# Patient Record
Sex: Female | Born: 1935 | Race: White | Hispanic: No | Marital: Single | Smoking: Never smoker
Health system: Southern US, Community
[De-identification: ages and names within clinical notes are randomized; demographics above are authoritative.]

## PROBLEM LIST (undated history)

## (undated) DIAGNOSIS — I4891 Unspecified atrial fibrillation: Secondary | ICD-10-CM

## (undated) DIAGNOSIS — M069 Rheumatoid arthritis, unspecified: Secondary | ICD-10-CM

## (undated) DIAGNOSIS — I509 Heart failure, unspecified: Secondary | ICD-10-CM

## (undated) DIAGNOSIS — I447 Left bundle-branch block, unspecified: Secondary | ICD-10-CM

## (undated) DIAGNOSIS — I340 Nonrheumatic mitral (valve) insufficiency: Secondary | ICD-10-CM

## (undated) DIAGNOSIS — I272 Pulmonary hypertension, unspecified: Secondary | ICD-10-CM

## (undated) DIAGNOSIS — I341 Nonrheumatic mitral (valve) prolapse: Secondary | ICD-10-CM

## (undated) DIAGNOSIS — J449 Chronic obstructive pulmonary disease, unspecified: Secondary | ICD-10-CM

## (undated) DIAGNOSIS — Z950 Presence of cardiac pacemaker: Secondary | ICD-10-CM

## (undated) DIAGNOSIS — I493 Ventricular premature depolarization: Secondary | ICD-10-CM

## (undated) DIAGNOSIS — Z90710 Acquired absence of both cervix and uterus: Secondary | ICD-10-CM

## (undated) DIAGNOSIS — Z8679 Personal history of other diseases of the circulatory system: Secondary | ICD-10-CM

## (undated) DIAGNOSIS — I10 Essential (primary) hypertension: Secondary | ICD-10-CM

## (undated) DIAGNOSIS — Z953 Presence of xenogenic heart valve: Secondary | ICD-10-CM

## (undated) DIAGNOSIS — Z9889 Other specified postprocedural states: Secondary | ICD-10-CM

## (undated) HISTORY — DX: Nonrheumatic mitral (valve) insufficiency: I34.0

## (undated) HISTORY — PX: CHOLECYSTECTOMY: SHX55

## (undated) HISTORY — DX: Rheumatoid arthritis, unspecified: M06.9

## (undated) HISTORY — DX: Unspecified atrial fibrillation: I48.91

## (undated) HISTORY — DX: Acquired absence of both cervix and uterus: Z90.710

## (undated) HISTORY — PX: OVARIAN CYST REMOVAL: SHX89

## (undated) HISTORY — DX: Left bundle-branch block, unspecified: I44.7

## (undated) HISTORY — DX: Ventricular premature depolarization: I49.3

## (undated) HISTORY — DX: Nonrheumatic mitral (valve) prolapse: I34.1

## (undated) HISTORY — PX: PACEMAKER INSERTION: SHX728

## (undated) HISTORY — DX: Pulmonary hypertension, unspecified: I27.20

## (undated) HISTORY — PX: CATARACT EXTRACTION: SUR2

## (undated) HISTORY — PX: OTHER SURGICAL HISTORY: SHX169

## (undated) HISTORY — PX: ABDOMINAL HYSTERECTOMY: SHX81

## (undated) HISTORY — PX: APPENDECTOMY: SHX54

---

## 1998-02-02 ENCOUNTER — Ambulatory Visit (HOSPITAL_COMMUNITY): Admission: RE | Admit: 1998-02-02 | Discharge: 1998-02-02 | Payer: Self-pay | Admitting: Pulmonary Disease

## 2002-01-09 ENCOUNTER — Encounter: Payer: Self-pay | Admitting: Emergency Medicine

## 2002-01-09 ENCOUNTER — Inpatient Hospital Stay (HOSPITAL_COMMUNITY): Admission: EM | Admit: 2002-01-09 | Discharge: 2002-01-10 | Payer: Self-pay | Admitting: Plastic Surgery

## 2002-01-10 ENCOUNTER — Encounter: Payer: Self-pay | Admitting: Emergency Medicine

## 2004-11-27 ENCOUNTER — Ambulatory Visit: Payer: Self-pay | Admitting: Pulmonary Disease

## 2005-09-21 ENCOUNTER — Emergency Department (HOSPITAL_COMMUNITY): Admission: EM | Admit: 2005-09-21 | Discharge: 2005-09-21 | Payer: Self-pay | Admitting: Family Medicine

## 2006-02-16 ENCOUNTER — Ambulatory Visit: Payer: Self-pay | Admitting: Pulmonary Disease

## 2006-09-30 ENCOUNTER — Ambulatory Visit (HOSPITAL_COMMUNITY): Admission: RE | Admit: 2006-09-30 | Discharge: 2006-09-30 | Payer: Self-pay | Admitting: Endocrinology

## 2007-06-12 DIAGNOSIS — J309 Allergic rhinitis, unspecified: Secondary | ICD-10-CM | POA: Insufficient documentation

## 2007-06-12 DIAGNOSIS — J449 Chronic obstructive pulmonary disease, unspecified: Secondary | ICD-10-CM

## 2007-06-12 DIAGNOSIS — J40 Bronchitis, not specified as acute or chronic: Secondary | ICD-10-CM | POA: Insufficient documentation

## 2007-06-12 DIAGNOSIS — J4489 Other specified chronic obstructive pulmonary disease: Secondary | ICD-10-CM | POA: Insufficient documentation

## 2007-06-12 DIAGNOSIS — J329 Chronic sinusitis, unspecified: Secondary | ICD-10-CM | POA: Insufficient documentation

## 2007-06-14 ENCOUNTER — Ambulatory Visit: Payer: Self-pay | Admitting: Pulmonary Disease

## 2007-10-27 ENCOUNTER — Ambulatory Visit: Payer: Self-pay | Admitting: Cardiology

## 2007-11-08 ENCOUNTER — Ambulatory Visit: Payer: Self-pay

## 2007-11-08 ENCOUNTER — Encounter: Payer: Self-pay | Admitting: Cardiology

## 2007-11-26 ENCOUNTER — Ambulatory Visit: Payer: Self-pay | Admitting: Cardiology

## 2007-12-16 ENCOUNTER — Ambulatory Visit (HOSPITAL_COMMUNITY): Admission: RE | Admit: 2007-12-16 | Discharge: 2007-12-16 | Payer: Self-pay | Admitting: Endocrinology

## 2008-12-20 ENCOUNTER — Ambulatory Visit (HOSPITAL_COMMUNITY): Admission: RE | Admit: 2008-12-20 | Discharge: 2008-12-20 | Payer: Self-pay | Admitting: Endocrinology

## 2009-11-12 DIAGNOSIS — I2789 Other specified pulmonary heart diseases: Secondary | ICD-10-CM

## 2009-11-12 DIAGNOSIS — I080 Rheumatic disorders of both mitral and aortic valves: Secondary | ICD-10-CM

## 2009-11-12 DIAGNOSIS — I059 Rheumatic mitral valve disease, unspecified: Secondary | ICD-10-CM | POA: Insufficient documentation

## 2009-11-14 ENCOUNTER — Ambulatory Visit: Payer: Self-pay | Admitting: Cardiology

## 2009-11-14 DIAGNOSIS — R9431 Abnormal electrocardiogram [ECG] [EKG]: Secondary | ICD-10-CM

## 2009-12-06 ENCOUNTER — Ambulatory Visit: Payer: Self-pay

## 2009-12-06 ENCOUNTER — Ambulatory Visit: Payer: Self-pay | Admitting: Internal Medicine

## 2009-12-06 ENCOUNTER — Encounter (INDEPENDENT_AMBULATORY_CARE_PROVIDER_SITE_OTHER): Payer: Self-pay | Admitting: *Deleted

## 2009-12-06 ENCOUNTER — Ambulatory Visit (HOSPITAL_COMMUNITY): Admission: RE | Admit: 2009-12-06 | Discharge: 2009-12-06 | Payer: Self-pay | Admitting: Cardiology

## 2009-12-10 ENCOUNTER — Telehealth: Payer: Self-pay | Admitting: Cardiology

## 2010-01-03 ENCOUNTER — Encounter
Admission: RE | Admit: 2010-01-03 | Discharge: 2010-01-03 | Payer: Self-pay | Source: Home / Self Care | Admitting: Endocrinology

## 2010-08-25 ENCOUNTER — Emergency Department (HOSPITAL_COMMUNITY)
Admission: EM | Admit: 2010-08-25 | Discharge: 2010-08-25 | Payer: Self-pay | Source: Home / Self Care | Admitting: Emergency Medicine

## 2010-08-25 ENCOUNTER — Encounter: Payer: Self-pay | Admitting: Endocrinology

## 2010-08-26 ENCOUNTER — Encounter: Payer: Self-pay | Admitting: Physician Assistant

## 2010-08-27 LAB — CBC
HCT: 38.6 % (ref 36.0–46.0)
Hemoglobin: 12.5 g/dL (ref 12.0–15.0)
MCH: 31.1 pg (ref 26.0–34.0)
RBC: 4.02 MIL/uL (ref 3.87–5.11)

## 2010-08-27 LAB — POCT I-STAT, CHEM 8
Chloride: 103 mEq/L (ref 96–112)
Creatinine, Ser: 1.2 mg/dL (ref 0.4–1.2)
Glucose, Bld: 111 mg/dL — ABNORMAL HIGH (ref 70–99)
Hemoglobin: 13.3 g/dL (ref 12.0–15.0)
Potassium: 3.6 mEq/L (ref 3.5–5.1)
Sodium: 139 mEq/L (ref 135–145)

## 2010-08-27 LAB — POCT CARDIAC MARKERS
Myoglobin, poc: 109 ng/mL (ref 12–200)
Troponin i, poc: <0.05 ng/mL (ref 0.00–0.09)

## 2010-08-27 LAB — D-DIMER, QUANTITATIVE: D-Dimer, Quant: 0.69 ug/mL-FEU — ABNORMAL HIGH (ref 0.00–0.48)

## 2010-08-28 ENCOUNTER — Encounter: Payer: Self-pay | Admitting: Cardiovascular Disease

## 2010-08-28 ENCOUNTER — Ambulatory Visit
Admission: RE | Admit: 2010-08-28 | Discharge: 2010-08-28 | Payer: Self-pay | Source: Home / Self Care | Attending: Physician Assistant | Admitting: Physician Assistant

## 2010-08-28 ENCOUNTER — Encounter: Payer: Self-pay | Admitting: Physician Assistant

## 2010-08-28 DIAGNOSIS — R079 Chest pain, unspecified: Secondary | ICD-10-CM | POA: Insufficient documentation

## 2010-09-02 ENCOUNTER — Telehealth: Payer: Self-pay | Admitting: Cardiology

## 2010-09-03 NOTE — Progress Notes (Signed)
Summary: echo results  Phone Note Call from Patient Call back at [phone]   Summary of Call: pt calling requesting results of echo, having phone problems, please call sister Emmit Alexanders and give results Initial call taken by: Migdalia Dk,  Dec 10, 2009 3:00 PM  Follow-up for Phone Call        called and spoke with pts sister as requested with results.  She will have the pt to call back if further questions Follow-up by: Charolotte Capuchin, RN,  Dec 10, 2009 3:36 PM

## 2010-09-03 NOTE — Assessment & Plan Note (Signed)
Summary: f2y/kfw   Visit Type:  Follow-up Primary Provider:  Corrin Parker, MD  CC:  Pulm HTN and MVP.  History of Present Illness: The patient presents for follow up of the above.  Since I last saw her she has had no new cardiovascular complaints. She is active. She does her activities of daily living without significant limitation. She climbs stairs and does some vacuum cleaning. With this she denies any shortness of breath. She's had no palpitations, presyncope or syncope. She's had no PND or orthopnea. She has had no weight gain or edema.  Current Medications (verified): 1)  Fosamax 70 Mg Tabs (Alendronate Sodium) .Marland Kitchen.. 1 Tab Weekly 2)  Methotrexate 2.5 Mg Tabs (Methotrexate Sodium) .... 4 Tabs Once Weekly 3)  Prednisone 5 Mg Tabs (Prednisone) .... 1/2 Tab Every Other Day 4)  Folic Acid 1 Mg Tabs (Folic Acid) .... 2 Tabs Once Daily 5)  Maxzide-25 37.5-25 Mg Tabs (Triamterene-Hctz) .... 1/2 Tab Once Daily 6)  Calcium-Vitamin D 250-125 Mg-Unit Tabs (Calcium Carbonate-Vitamin D) .... 2 Tabs Once Daily  Allergies: 1)  ! * Tequin 2)  Sulfa  Past History:  Past Medical History:  1. Mitral valve prolapse.   2. Mild to moderate pulmonary hypertension by echo.   3. Mild mitral regurgitation.   4. Asthma.   5. Rheumatoid arthritis.   6. Mild basal septal hypertrophy.       Past Surgical History: 1. Hysterectomy.  2. Cholecystectomy   Review of Systems       As stated in the HPI and negative for all other systems.   Vital Signs:  Patient profile:   77 year old female Height:      60 inches Weight:      129 pounds BMI:     25.28 Pulse rate:   70 / minute Pulse rhythm:   irregular BP sitting:   130 / 70  (right arm) Cuff size:   regular  Vitals Entered By: Danielle Rankin, CMA (November 14, 2009 12:01 PM)  Physical Exam  General:  Well developed, well nourished, in no acute distress. Head:  normocephalic and atraumatic Eyes:  PERRLA/EOM intact; conjunctiva and lids  normal. Mouth:  Teeth, gums and palate normal. Oral mucosa normal. Neck:  Neck supple, no JVD. No masses, thyromegaly or abnormal cervical nodes. Chest Wall:  no deformities or breast masses noted Lungs:  Clear bilaterally to auscultation and percussion. Abdomen:  Bowel sounds positive; abdomen soft and non-tender without masses, organomegaly, or hernias noted. No hepatosplenomegaly. Msk:  Back normal, normal gait. Muscle strength and tone normal. Extremities:  No clubbing or cyanosis. Neurologic:  Alert and oriented x 3. Skin:  Intact without lesions or rashes. Cervical Nodes:  no significant adenopathy Axillary Nodes:  no significant adenopathy Inguinal Nodes:  no significant adenopathy Psych:  Normal affect.   Detailed Cardiovascular Exam  Neck    Carotids: right carotid bruit versus transmitted systolic murmur    Neck Veins: Normal, no JVD.    Heart    Inspection: no deformities or lifts noted.      Palpation: normal PMI with no thrills palpable.      Auscultation: S1 and S2 within normal limits, no S3, no S4, no clicks, no rubs, 3/6 systolic murmur radiating to the axilla and also heard radiating anteriorly about the aortic outflow tract, holosystolic, no diastolic murmurs  Vascular    Abdominal Aorta: no palpable masses, pulsations, or audible bruits.      Femoral Pulses: normal femoral pulses bilaterally.  Pedal Pulses: normal pedal pulses bilaterally.      Radial Pulses: normal radial pulses bilaterally.      Peripheral Circulation: no clubbing, cyanosis, or edema noted with normal capillary refill.     EKG  Procedure date:  11/14/2009  Findings:      sinus rhythm, left bundle branch block, first degree AV block, left ward axis  Impression & Recommendations:  Problem # 1:  MITRAL REGURGITATION, MILD (ICD-396.3) I am concerned that her mitral regurgitation may be more significant. An echocardiogram is indicated and will be obtained. This will also allow me to  assess pulmonary hypertension as she did have moderate TR in the past as well. Orders: EKG w/ Interpretation (93000) Echocardiogram (Echo)  Problem # 2:  PULMONARY HYPERTENSION (ICD-416.8) As above  Problem # 3:  ABNORMAL ELECTROCARDIOGRAM (ICD-794.31) Her EKG is unchanged from previous. No further evaluation is needed.  Patient Instructions: 1)  Your physician recommends that you schedule a follow-up appointment in: as needed 2)  Your physician recommends that you continue on your current medications as directed. Please refer to the Current Medication list given to you today. 3)  Your physician has requested that you have an echocardiogram.  Echocardiography is a painless test that uses sound waves to create images of your heart. It provides your doctor with information about the size and shape of your heart and how well your heart's chambers and valves are working.  This procedure takes approximately one hour. There are no restrictions for this procedure. 4)  You have been diagnosed with heart valve disease. Heart valves are the doorways between the chambers of your heart. They open and close as blood moves through your heart. Having heart valve disease means that you have a problem with one or more of these valves. This problem keeps the valve from opening or closing correctly. Please see the handout/brochure given to you today for more information.

## 2010-09-04 ENCOUNTER — Telehealth (INDEPENDENT_AMBULATORY_CARE_PROVIDER_SITE_OTHER): Payer: Self-pay

## 2010-09-05 ENCOUNTER — Ambulatory Visit (HOSPITAL_COMMUNITY): Payer: Medicare Other | Attending: Physician Assistant

## 2010-09-05 ENCOUNTER — Encounter: Payer: Self-pay | Admitting: Cardiology

## 2010-09-05 DIAGNOSIS — I447 Left bundle-branch block, unspecified: Secondary | ICD-10-CM

## 2010-09-05 DIAGNOSIS — R0989 Other specified symptoms and signs involving the circulatory and respiratory systems: Secondary | ICD-10-CM

## 2010-09-05 DIAGNOSIS — R0602 Shortness of breath: Secondary | ICD-10-CM | POA: Insufficient documentation

## 2010-09-05 DIAGNOSIS — R079 Chest pain, unspecified: Secondary | ICD-10-CM

## 2010-09-05 NOTE — Assessment & Plan Note (Signed)
Summary: eph.gd   Visit Type:  EPH Primary Provider:  Corrin Parker, MD  CC:  chest pain this past Sunday night and none since then....sob yesterday and this morning...denies any other complaints today.  History of Present Illness: Primary Cardiologist:  Dr. James Hochrein  Nancy Bush is a 77 yo female with a h/o MVP and pulmonary HTN.  She last saw Dr. Hochrein in 11/2009.  An echo was done after that visit on 12/06/2009 and demonstrated an EF 55-60%; focal basal hypertrophy, grade 2 diast dysfxn; mod MVP with moderate MR; mild to mod LAE and mild RAE; mod-severe TR with a PASP of 39.  Her last myoview was in 11/2007 and demonstrated no ischemia.  She presented to the ED on 08/25/2010 with chest pain.  POC cardiac enzymes were neg. x 2.  DDimer was abnormal and she had a chest CT that was negative for pulmonary embolism and notable for mild dependent interstitial edema.  CXR demonstrated no edema, borderline CM and mild ATX.  Pertinent labs included Na 139, K 3.6, BUN 14, Creat 1.2, Hgb 13.3, WBC 12.4.  The ED MD spoke with Dr. Bensimhon and the patient was set up for follow today.  She awoke from sleep with chest discomfort.  She cannot really qualify it.  It lasted about 30-60 minutes.  There was no radiation.  She did have associated nausea.  The nausea concerned her and she called EMS.  She denies any associated diaphoresis, syncope or shortness of breath.  She has noted some shortness of breath with exertion recently.  This does not seem to be all that significant to her.  She denies orthopnea or PND.  She had some more discomfort the night she returned from the emergency room.  However, she has not had any further chest discomfort since then.  She denies edema.  She did have a cough from a cold preceding her chest pain.  Current Medications (verified): 1)  Methotrexate 2.5 Mg Tabs (Methotrexate Sodium) .... 4 Tabs Once Weekly 2)  Prednisone 5 Mg Tabs (Prednisone) .... 1/2 Tab Every Other  Day 3)  Folic Acid 1 Mg Tabs (Folic Acid) .... 2 Tabs Once Daily 4)  Maxzide-25 37.5-25 Mg Tabs (Triamterene-Hctz) .... 1/2 Tab Once Daily 5)  Calcium-Vitamin D 250-125 Mg-Unit Tabs (Calcium Carbonate-Vitamin D) .... 2 Tabs Once Daily  Allergies: 1)  ! * Tequin 2)  Sulfa  Past History:  Past Medical History: Last updated: 11/14/2009  1. Mitral valve prolapse.   2. Mild to moderate pulmonary hypertension by echo.   3. Mild mitral regurgitation.   4. Asthma.   5. Rheumatoid arthritis.   6. Mild basal septal hypertrophy.       Review of Systems       As per  the HPI.  All other systems reviewed and negative.   Vital Signs:  Patient profile:   77 year old female Height:      60 inches Weight:      127.75 pounds BMI:     25 .04 Pulse rate:   81 / minute Pulse rhythm:   irregular BP sitting:   112 / 62  (left arm) Cuff size:   regular  Vitals Entered By: Danielle Rankin, CMA (August 28, 2010 2:13 PM)  Physical Exam  General:  Well nourished, well developed, in no acute distress HEENT: normal Neck: no JVD Cardiac:  normal S1, S2; RRR; 2/6 sys murmur at LSB Lungs:  clear to auscultation bilaterally, no wheezing, rhonchi or rales  Abd: soft, nontender, no hepatomegaly Ext: no edema Skin: warm and dry Neuro:  CNs 2-12 intact, no focal abnormalities noted   EKG  Procedure date:  08/28/2010  Findings:      normal sinus rhythm Heart rate 81 PVCs Left bundle-branch block  Impression & Recommendations:  Problem # 1:  CHEST PAIN UNSPECIFIED (ICD-786.50) She has atypical symptoms.  She has not had a stress test was 2009.  We should rule out coronary disease.  Therefore, I will set her up for a pharmacologic nuclear scan.  If this is normal, she may have had chest wall pain from a recent URI.  She will be brought back in followup with Dr. Antoine Poche.  She knows to contact us should she have any recurrent symptoms.  Orders: EKG w/ Interpretation (93000) Nuclear Stress Test  (Nuc Stress Test)  Problem # 2:  MITRAL VALVE PROLAPSE (ICD-424.0) She just had an echocardiogram in May of 2011.  There is no need to repeat that at this time.  She is not displaying any signs or symptoms of congestive heart failure.  Problem # 3:  SHORTNESS OF BREATH (ICD-786.05) As above, will get a myoview.  Orders: EKG w/ Interpretation (93000) Nuclear Stress Test (Nuc Stress Test)  Patient Instructions: 1)  Your physician recommends that you schedule a follow-up appointment in: With Dr. Antoine Poche in 2-3 weeks 2)  To be scheduled for Lexiscan Myoview 3)  Your physician has requested that you have Lexiscan myoview.  For further information please visit [email or web address]  Please follow instruction sheet, as given.

## 2010-09-05 NOTE — Miscellaneous (Signed)
  Clinical Lists Changes  Observations: Added new observation of ECHOINTERP:  - Left ventricle: The cavity size was normal. There was mild focal       basal hypertrophy of the septum. Systolic function was normal. The       estimated ejection fraction was in the range of 55% to 60%. Wall       motion was normal; there were no regional wall motion       abnormalities. Features are consistent with a pseudonormal left       ventricular filling pattern, with concomitant abnormal relaxation       and increased filling pressure (grade 2 diastolic dysfunction).     - Mitral valve: Mildly calcified annulus. Moderate prolapse,       involving the posterior leaflet. Moderate regurgitation, with       multiple jets directed centrally and anteriorly.     - Left atrium: The atrium was mildly to moderately dilated.     - Right atrium: The atrium was mildly dilated.     - Tricuspid valve: Moderate-severe regurgitation.     - Pulmonary arteries: Systolic pressure was mildly increased. PA       peak pressure: 39mm Hg (S). (12/06/2009 15:26)      Echocardiogram  Procedure date:  12/06/2009  Findings:       - Left ventricle: The cavity size was normal. There was mild focal       basal hypertrophy of the septum. Systolic function was normal. The       estimated ejection fraction was in the range of 55% to 60%. Wall       motion was normal; there were no regional wall motion       abnormalities. Features are consistent with a pseudonormal left       ventricular filling pattern, with concomitant abnormal relaxation       and increased filling pressure (grade 2 diastolic dysfunction).     - Mitral valve: Mildly calcified annulus. Moderate prolapse,       involving the posterior leaflet. Moderate regurgitation, with       multiple jets directed centrally and anteriorly.     - Left atrium: The atrium was mildly to moderately dilated.     - Right atrium: The atrium was mildly dilated.     - Tricuspid  valve: Moderate-severe regurgitation.     - Pulmonary arteries: Systolic pressure was mildly increased. PA       peak pressure: 39mm Hg (S).

## 2010-09-06 NOTE — Letter (Signed)
Summary: Outpatient Coinsurance Notice  Outpatient Coinsurance Notice   Imported By: Marylou Mccoy 12/07/2009 11:48:07  _____________________________________________________________________  External Attachment:    Type:   Image     Comment:   External Document

## 2010-09-09 ENCOUNTER — Ambulatory Visit: Payer: Self-pay | Admitting: Physician Assistant

## 2010-09-11 NOTE — Progress Notes (Signed)
Summary: question re bp readings  Phone Note Call from Patient   Caller: Patient [phone] Summary of Call: pt had bp checked last week at another md office, bp taking in both arms very different readings, pt has stress test thursday, does she need to do anything before then? Initial call taken by: Glynda Jaeger,  September 02, 2010 10:25 AM  Follow-up for Phone Call        pt reports being in Dr Mills Koller office last week and states left arm (82 systolic) and right arm (120 systolic) BP's different.  Explained to pt we will need to recheck her BP's the next time she is in the office to verify. There is no treatment at this time.  She denies any s/s.  She states understanding and will make sure she asks whoever is checking her BP to check in both arms.   Follow-up by: Charolotte Capuchin, RN,  September 02, 2010 12:21 PM

## 2010-09-11 NOTE — Miscellaneous (Signed)
Summary: Social Worker Report  Social Worker Report   Imported By: Elenor Legato 08/28/2010 13:48:05  _____________________________________________________________________  External Attachment:    Type:   Image     Comment:   External Document

## 2010-09-11 NOTE — Progress Notes (Signed)
Summary: Nuc. Pre-Procedure  Phone Note Outgoing Call Call back at [facility] Phone [phone]   Call placed by: Irean Hong, RN,  September 04, 2010 3:50 PM Summary of Call: Left message with information on Myoview Information Sheet (see scanned document for details).      Nuclear Med Background Indications for Stress Test: Evaluation for Ischemia   History: Abnormal EKG, Asthma, COPD, Echo, Myocardial Perfusion Study, MVP  History Comments: 4/09 MPS: NL, EF=85%. 5/11 Echo: Ef=55-60%. Hx. Pulm. HTN,Moderate severe TR. Hx. PVC's.  Symptoms: Chest Pain, DOE, Nausea, SOB    Nuclear Pre-Procedure Cardiac Risk Factors: CVA, Family History - CAD, Hypertension, LBBB Height (in): 60

## 2010-09-11 NOTE — Assessment & Plan Note (Signed)
Summary: Cardiology Nuclear Testing  Nuclear Med Background Indications for Stress Test: Evaluation for Ischemia, Post Hospital  Indications Comments: 08/17/10 chest pain  History: Abnormal EKG, Asthma, COPD, Echo, Myocardial Perfusion Study, MVP  History Comments: '09 [id number], EF=85%; 5/11 Echo: EF=55-60%  Symptoms: Chest Pain, DOE, Fatigue, Nausea  Symptoms Comments: Last episode of YN:WGNF since d/c.   Nuclear Pre-Procedure Cardiac Risk Factors: CVA, Family History - CAD, Hypertension, LBBB Caffeine/Decaff Intake: None NPO After: 7:30 PM Lungs: Clear.  O2 Sat 96% on RA. IV 0.9% NS with Angio Cath: 20g     IV Site: R Wrist IV Started by: Stanton Kidney, EMT-P Chest Size (in) 36     Cup Size B     Height (in): 61 Weight (lb): 122 BMI: 23.14  Nuclear Med Study 1 or 2 day study:  1 day     Stress Test Type:  Adenosine Reading MD:  Olga Millers, MD     Referring MD:  Rollene Rotunda, MD Resting Radionuclide:  Technetium 68m Tetrofosmin     Resting Radionuclide Dose:  10.5 mCi  Stress Radionuclide:  Technetium 51m Tetrofosmin     Stress Radionuclide Dose:  33 mCi   Stress Protocol  Dose of Adenosine:  31.1 mg    Stress Test Technologist:  [employer]a [employer], CMA-N     Nuclear Technologist:  Domenic Polite, CNMT  Rest Procedure  Myocardial perfusion imaging was performed at rest 45 minutes following the intravenous administration of Technetium 23m Tetrofosmin.  Stress Procedure  Baseline EKG showed LBBB with ectopic atrial rhythm.  The patient received IV adenosine at 140 mcg/kg/min for 4 minutes. There was a brief episode of complete heart block and occasional PVC's with infusion. Technetium 25m Tetrofosmin was injected at the 2 minute mark and quantitative spect images were obtained after a 45 minute delay.  QPS Raw Data Images:  Acquisition technically good; normal left ventricular size. Stress Images:  There is decreased uptake in the apex. Rest Images:  Normal homogeneous  uptake in all areas of the myocardium. Subtraction (SDS):  These findings are consistent with probable apical thinning; cannot R/O minimal apical ischemia. Transient Ischemic Dilatation:  1.02  (Normal <1.22)  Lung/Heart Ratio:  .29  (Normal <0.45)  Quantitative Gated Spect Images QGS EDV:  53 ml QGS ESV:  12 ml QGS EF:  77 % QGS cine images:  Normal wall motion.   Overall Impression  Exercise Capacity: Adenosine study with no exercise. BP Response: Normal blood pressure response. Clinical Symptoms: No chest pain ECG Impression: Uninterpretable due to baseline LBBB. Overall Impression: Abnormal adenosine nuclear study with probable apical thinning; cannot R/O minimal apical ischemia.  Appended Document: Cardiology Nuclear Testing Normal.  Appended Document: Cardiology Nuclear Testing pt aware of results

## 2010-12-17 NOTE — Assessment & Plan Note (Signed)
[facility]                            CARDIOLOGY OFFICE NOTE   NAME:Nancy, Bush                   MRN:          [id number]  DATE:11/26/2007                            DOB:          April 10, 1926    PRIMARY CARE PHYSICIAN:  Alfonse Alpers. Dagoberto Ligas, M.D.   REASON FOR PRESENTATION:  Evaluate the patient with heart murmur and  chest discomfort.   HISTORY OF PRESENT ILLNESS:  The patient presents for follow-up of the  above.  Since I last saw her, she had a stress perfusion study which  demonstrated no evidence of ischemia or infarct.  She had a very  vigorous ejection fraction of 85%.  She had an echocardiogram which  demonstrated that she had some mild mitral valve prolapse with mild  regurgitation.  She does have some mild TR.  She has a slightly  thickened basal septum.  She does have some elevated pulmonary pressure  that is moderately increased.  The etiology of this is not clear.   I brought the patient back to discuss this.  She says she has had no  further chest heaviness.  She has had no new dyspnea.  She is not  describing any PND or orthopnea.  She has not been having any  palpitation, presyncope or syncope.  Of note, she has had flu-like  illness in the last week.  She has been very fatigued and spent some  time in bed.  She has seen Dr. Dagoberto Ligas who did not think she had a  pneumonia.  She is slowly recovering from this, but still very tired.   PAST MEDICAL HISTORY:  1. Mitral valve prolapse.  2. Mild to moderate pulmonary hypertension by echo.  3. Mild mitral regurgitation.  4. Asthma.  5. Rheumatoid arthritis.  6. Mild basal septal hypertrophy.  7. Hysterectomy.  8. Cholecystectomy   ALLERGIES/INTOLERANCES:  SULFA AND PHENERGAN.   MEDICATIONS:  1. Calcium.  2. Prednisone 2-1/2 mg every other day.  3. Folic acid.  4. Fosamax.  5. Methotrexate.  6. Maxzide.  7. Aspirin 81 mg daily.   REVIEW OF SYSTEMS:  As stated in the HPI,  otherwise negative for other  systems.   PHYSICAL EXAMINATION:  GENERAL:  The patient is in no distress.  VITAL SIGNS:  Blood pressure 102/65, heart rate 90 and regular, weight  132 pounds.  HEENT:  Eyes unremarkable.  Pupils equal, round and reactive to light.  Fundi not visualized, oral mucosa unremarkable.  NECK:  No jugular distention at 45 degrees.  Carotid upstroke brisk and  symmetrical.  No bruits, thyromegaly.  LYMPHATICS:  No adenopathy.  LUNGS:  Clear to auscultation bilaterally.  BACK:  No costovertebral angle tenderness.  CHEST:  Unremarkable.  HEART:  PMI not displaced or sustained, S1-S2 within normal limits.  No  S3 or S4, no clicks, no rubs.  A 3/6 apical holosystolic murmur  radiating slightly out the axilla, no diastolic murmurs.  ABDOMEN:  Flat, positive bowel sounds normal in frequency and pitch.  No  bruits, rebound or guarding.  No midline pulsatile mass.  No  organomegaly.  SKIN:  No rashes, no nodules.  EXTREMITY:  Pulses 2+ throughout.  No edema, cyanosis, clubbing.  NEUROLOGICAL:  Oriented to person, place, time.  Cranial nerves II-XII  grossly intact, motor grossly intact.   ASSESSMENT/PLAN:  1. Chest discomfort.  The patient is no longer having the chest      discomfort that prompted her visit.  She had a negative stress      perfusion study.  The possibility of obstructive coronary disease      is quite low.  No further cardiovascular testing is suggested.  2. Septal hypertrophy.  The patient does have this along with a murmur      suggesting some outflow gradient, though this was not measured.      She has some TR which could also explain the murmur.  Finally, the      echo demonstrated some pulmonary hypertension.  She is not overtly      dyspneic.  At this point, she can be followed clinically with      physical examinations and with repeat echocardiograms.  I suggested      that she come back to see me in a couple of years for this       evaluation and sooner if she has any symptoms such as increasing      dyspnea.  3. Follow-up as above.     Rollene Rotunda, MD, [facility]  Electronically Signed    JH/MedQ  DD: 11/26/2007  DT: 11/26/2007  Job #: [id number]   cc:   Alfonse Alpers. Dagoberto Ligas, M.D.

## 2010-12-17 NOTE — Assessment & Plan Note (Signed)
[facility]                            CARDIOLOGY OFFICE NOTE   NAME:Nancy Bush, Vialpando                   MRN:          [id number]  DATE:10/27/2007                            DOB:          [birth year]    PRIMARY CARE PHYSICIAN:  Alfonse Alpers. Dagoberto Ligas, M.D.   REASON FOR PRESENTATION:  Evaluate patient with chest pain and abnormal  EKG.   HISTORY OF PRESENT ILLNESS:  The patient is a pleasant 77 year old white  female with a history of mitral valve prolapse in the past.  She has had  stress perfusion studies which were negative.  Recently she had some  chest discomfort.  She saw Dr. Dagoberto Ligas.  She was noted to have an EKG  with a left bundle branch block with an interventricular conduction  delay.  This is apparently new since the previous EKG.   The patient describes about 2 weeks ago an episode of chest heaviness.  She did not pay much attention to this.  She was not sure how long it  lasted.  It was 1 day.  A week later she had some more discomfort.  This  was more of a pain.  It lasted a couple of hours.  It was substernal.  It was at rest.  She thought it was somewhat mild.  She thought her  heart might have been a little irregular at that time.  She was not  particularly short of breath.  She did not become nauseated.  She did  not have any jaw or arm discomfort.  It went away on its own.  She does  not recall having this kind of discomfort before.  Of note, she is not  particularly active.  However, with her chores and activities of daily  living, she cannot bring this on.  She saw Dr. Dagoberto Ligas and was noted to  have the EKG described below.   The patient has not been having any shortness of breath, PND or  orthopnea.  Aside from the skipped heartbeats mentioned above or  sensation of palpitations, she has not had any other evidence of this.   PAST MEDICAL HISTORY:  1. Mitral valve prolapse confirmed by echo in 1997.  2. Mild mitral regurgitation  at that time.  3. Asthma.  4. Rheumatoid arthritis.   PAST SURGICAL HISTORY:  Hysterectomy, cholecystectomy.   ALLERGIES:  SULFA and PHENERGAN.   MEDICATIONS:  Fosamax, methotrexate, prednisone, folic acid, Maxzide  37.5/25 mg 1/2 tablet daily, calcium, metoprolol 25 mg b.i.d., aspirin  81 mg daily.   SOCIAL HISTORY:  The patient lives alone.  She is single.  She has no  children.  She has never smoked cigarettes and does not drink alcohol.   FAMILY HISTORY:  Noncontributory for early coronary artery disease.  Her  parents both died of strokes.   REVIEW OF SYSTEMS:  As stated in the HPI and positive for reflux, joint  pain.  Negative for all other systems.   PHYSICAL EXAMINATION:  The patient is in no distress.  Blood pressure 117/65, heart rate 82 and regular, weight 136 pounds,  body mass index  25.  HEENT:  Eyelids unremarkable.  Pupils equal, round, reactive to light.  Fundi not visualized.  Oral mucosa normal.  NECK:  No jugular distention at 45 degrees, carotid upstroke brisk and  symmetrical.  No bruits, no thyromegaly.  LYMPHATICS:  No cervical, axillary or inguinal adenopathy.  LUNGS:  Clear to auscultation bilaterally.  BACK:  No costovertebral angle mass.  CHEST:  Normal.  HEART:  PMI not displaced or sustained, S1 and S2 within normal.  No S3,  no clicks, a 3/6 apical holosystolic murmur radiating slightly to the  axilla, no diastolic murmurs.  ABDOMEN:  Flat, positive bowel sounds, normal in frequency and pitch, no  bruits, no rebound, no guarding, no midline pulsatile mass, no  hepatomegaly, no splenomegaly.  SKIN:  No rashes, no nodules.  EXTREMITIES:  2+ pulses throughout, no edema, no cyanosis, no clubbing.  NEUROLOGIC:  Oriented to person, place and time.  Cranial nerves II-XII  grossly intact.  Motor grossly intact.   EKG (done Dr. Jerelene Redden office):  Sinus rhythm, leftward axis,  interventricular conduction delay.   ASSESSMENT/PLAN:  1. Chest  discomfort.  The patient had chest discomfort that was      worrisome for angina.  She has a change in her EKG compared with      previous per Dr. Dagoberto Ligas.  Given this, screening with a stress test      is warranted.  She is not quite sure she be would able to walk on a      treadmill.  Therefore, she will have an adenosine Cardiolite.      Further evaluation based on these results.  2. Mitral valve prolapse.  The patient clearly has mitral      regurgitation.  This was mild 12 years ago on echo.  She needs a      repeat echocardiogram to evaluate this.  We discussed that latest      recommendations for endocarditis prophylaxis.  She no longer needs      this.  3. Hypertension.  The patient must have a diagnosis of hypertension as      she is on a low dose of Maxzide.  She was recently started on beta      blocker and wonders about coming off of this.  Her blood pressure      is well-controlled.  I told her to wait until we do the stress      perfusion study and I will discuss the metoprolol with her.  I      agree with continuing the aspirin.  4. Rheumatoid arthritis per Dr. Phylliss Bob and Dr. Dagoberto Ligas.  5. Palpitations.  These are mild.  6. Follow-up.  I will see the patient again after the results of the      above to discuss these results.     Rollene Rotunda, MD, [facility]  Electronically Signed    JH/MedQ  DD: 10/27/2007  DT: 10/28/2007  Job #: [id number]   cc:   Alfonse Alpers. Dagoberto Ligas, M.D.

## 2010-12-20 NOTE — Discharge Summary (Signed)
[place]. [facility]  Patient:    Nancy Bush, Nancy Bush Visit Number: [id number] MRN: [id number]          Service Type: MED Location: [id number] Attending Physician:  Colon Branch Dictated by:   Delton See, P.A. Admit Date:  01/09/2002 Disc. Date: 01/10/02   CC:         Barbaraann Share, M.D. [facility]  Alfonse Alpers. Dagoberto Ligas, M.D.   Referring Physician Discharge [place]  DATE OF BIRTH:  26-Dec-1925  HISTORY:  Nancy Bush is a pleasant, 77 year old female, followed by Dr. Dagoberto Ligas and Dr. Shelle Iron, seen in the past by Dr. Eden Emms for somewhat atypical chest pain.  Apparently she had an echocardiogram as well as a Cardiolite in the past that was unremarkable.  We do not have those records with [place] at this time.  She also has a chronic left bundle-branch block.  The patient was seen in the emergency room on January 09, 2002, by Dr. Eden Emms, with again, atypical chest pain that had been present for approximately 12 hours, partially relieved by nitroglycerin.  Dr. Eden Emms admitted the patient for further evaluation of her symptoms.  PAST MEDICAL HISTORY: 1. Previous hysterectomy. 2. Left bundle-branch block. 3. History of atypical chest pain. 4. Asthma.  ALLERGIES:  SULFA.  ADMISSION MEDICATIONS:  Premarin, albuterol, and Pulmicort inhalers.  HOSPITAL COURSE:  As noted, this patient was admitted to [facility] through the emergency room by Dr. Eden Emms for further evaluation of chest pain. An adenosine Cardiolite was performed on January 10, 2002. This was negative for ischemia and revealed an ejection fraction of 74%.  Arrangements were made to discharge the patient home that evening in improved and stable condition.  LABORATORY DATA:  A CBC revealed hemoglobin 12.6, hematocrit 36.9, WBC 7.8, platelets 273,000.  Chemistry profile revealed BUN 12, creatinine 0.9, potassium 3.5, glucose 99.  A PT and a PTT were within normal limits.   Cardiac enzymes were negative x 3.  Calcium was noted to be 8.1.  A chest x-ray showed minimal left basilar scarring, no evidence of acute pulmonary disease.  DISCHARGE MEDICATIONS:  The patient was discharged on the same medications she was on prior to admission which included: 1. Premarin 0.625 mg q.d. 2. Pulmicort inhaler 2 puffs b.i.d. 3. Albuterol 2 puffs b.i.d.  DISCHARGE INSTRUCTIONS: 1. The patient was told to gradually increase her activity as tolerated.  It    was recommended that she try walking for 30 minutes a day. 2. She was to be on a low salt, low fat, low cholesterol diet. 3. She was to follow up with Dr. Dagoberto Ligas and Dr. Shelle Iron as scheduled,    Dr. Eden Emms as needed.  PROBLEM LIST AT TIME OF DISCHARGE: 1. Chest pain, negative enzymes. 2. Adenosine Cardiolite performed January 10, 2002, negative for ischemia,    ejection fraction of 74%. 3. History of asthma. 4. Status post hysterectomy. 5. Recent ear infection. Dictated by:   Delton See, P.A. Attending Physician:  Colon Branch DD:  01/10/02 TD:  01/10/02 Job: [id number]

## 2010-12-20 NOTE — H&P (Signed)
[place]. [facility]  Patient:    ZYKIRIA, BRUENING Visit Number: [id number] MRN: [id number]          Service Type: MED Location: [phone] Attending Physician:  Colon Branch Dictated by:   Noralyn Pick Eden Emms, M.D. [facility] Admit Date:  01/09/2002 Discharge Date: 01/10/2002                           History and Physical  HISTORY OF PRESENT ILLNESS: The patient is a 77 year old, patient of Drs. Gegick and [employer]. She has previously been seen in our office for somewhat atypical chest pain as I recall, and the patient recalls she had an echocardiogram and a Cardiolite study, which was unremarkable. She has a chronic left bundle-branch block.  She has done well since we last saw her in the office 2 or 3 years ago. She has had somewhat atypical chest pain over the last 12 hours. This is under the left breast and is not necessarily exertional. It was relieved partially with nitroglycerin.  Up until last night, she has been doing well. She is an active person. She lives by herself but continues to drive. She has two sisters in the area. She has not had any associated diaphoresis, palpitations, syncope.  She does not have any diabetes or hypertension.  She sees Dr. Shelle Iron for some asthma and Dr. Dagoberto Ligas for her primary care.  REVIEW OF SYSTEMS: Positive for recent right ear infection. She was started on amoxicillin last Wednesday.  PREVIOUS SURGICAL HISTORY: Remarkable for hysterectomy.  ALLERGIES: SULFA.  MEDICATIONS: She is on Premarin 0.625 mg a day. Albuterol and Pulmicort inhalers, and the antibiotic as indicated.  PHYSICAL EXAMINATION:  VITAL SIGNS: Examination is remarkable for a blood pressure of 130/80. She is in sinus rhythm, rate is 66.  LUNGS: Clear. There is no wheezing.  HEART: There is no S1, S2 at a split second heart sound.  ABDOMEN: Benign. There is a hysterectomy scar.  PULSES: Distal pulses are intact.  Chest x-ray  and labs are pending. ECG shows sinus rhythm in the left bundle.  IMPRESSION/PLAN: Somewhat atypical chest pain with chronic left bundle. We will have to rule her out for myocardial infarction. She will have a followup adenosine Cardiolite study. I do not think her asthma is active to warrant dobutamine. She will also have a 2-D echocardiogram to further assess her LV function. If her enzymes are negative and these tests are not high risk, she could probably be discharged late tomorrow. Further recommendations will be based on the results of her lab work as well as [place] and echocardiogram. Dictated by:   Noralyn Pick. Eden Emms, M.D. [facility] Attending Physician:  Colon Branch DD:  01/09/02 TD:  01/09/02 Job: [id number]

## 2011-02-03 ENCOUNTER — Other Ambulatory Visit: Payer: Self-pay | Admitting: Family Medicine

## 2011-02-03 DIAGNOSIS — Z1231 Encounter for screening mammogram for malignant neoplasm of breast: Secondary | ICD-10-CM

## 2011-02-20 ENCOUNTER — Ambulatory Visit
Admission: RE | Admit: 2011-02-20 | Discharge: 2011-02-20 | Disposition: A | Discharge status: Home / Self Care | Payer: PRIVATE HEALTH INSURANCE | Source: Ambulatory Visit | Attending: Family Medicine | Admitting: Family Medicine

## 2011-02-20 DIAGNOSIS — Z1231 Encounter for screening mammogram for malignant neoplasm of breast: Secondary | ICD-10-CM

## 2011-03-24 ENCOUNTER — Encounter: Payer: Self-pay | Admitting: Cardiovascular Disease

## 2011-05-13 ENCOUNTER — Encounter: Payer: Self-pay | Admitting: Cardiovascular Disease

## 2011-05-16 ENCOUNTER — Encounter: Payer: Self-pay | Admitting: *Deleted

## 2011-05-16 ENCOUNTER — Encounter: Payer: Self-pay | Admitting: Physician Assistant

## 2011-05-19 ENCOUNTER — Encounter: Payer: Self-pay | Admitting: Physician Assistant

## 2011-05-19 ENCOUNTER — Ambulatory Visit (INDEPENDENT_AMBULATORY_CARE_PROVIDER_SITE_OTHER): Payer: Medicare Other | Admitting: Physician Assistant

## 2011-05-19 VITALS — BP 130/86 | HR 117 | Ht 59.5 in | Wt 130.8 lb

## 2011-05-19 DIAGNOSIS — R0602 Shortness of breath: Secondary | ICD-10-CM

## 2011-05-19 DIAGNOSIS — I080 Rheumatic disorders of both mitral and aortic valves: Secondary | ICD-10-CM

## 2011-05-19 DIAGNOSIS — I4891 Unspecified atrial fibrillation: Secondary | ICD-10-CM | POA: Insufficient documentation

## 2011-05-19 DIAGNOSIS — I059 Rheumatic mitral valve disease, unspecified: Secondary | ICD-10-CM

## 2011-05-19 MED ORDER — WARFARIN SODIUM 3 MG PO TABS: 3.0000 mg | ORAL_TABLET | Freq: Every day | ORAL | Status: DC

## 2011-05-19 MED ORDER — METOPROLOL SUCCINATE ER 25 MG PO TB24: 25.0000 mg | ORAL_TABLET | Freq: Two times a day (BID) | ORAL | Status: DC

## 2011-05-19 NOTE — Assessment & Plan Note (Signed)
As noted, obtain echo to reassess LVF, mitral regurgitation and atrial size.

## 2011-05-19 NOTE — Patient Instructions (Addendum)
Your physician recommends that you schedule a follow-up appointment in: 2 WEEKS WITH DR. HOCHREIN AS PER SCOTT WEAVER, PA-C  Your physician has requested that you have an echocardiogram DX 424.0 MITRAL VALVE DISORDER. Echocardiography is a painless test that uses sound waves to create images of your heart. It provides your doctor with information about the size and shape of your heart and how well your heart's chambers and valves are working. This procedure takes approximately one hour. There are no restrictions for this procedure.  Your physician has recommended you make the following change in your medication: START TOPROL XL 25 MG 1 TABLET TWICE DAILY; START COUMADIN 3 MG 1 TABLET DAILY  You have been referred to [facility] TO ESTABLISH AS A NEW PT FOR COUMADIN THERAPY .  Your physician recommends that you return for lab work in: TODAY BMET, BNP, PT/INR 424.0 MITRAL VALVE DISORDER, 786.05 SOB, 427.31 AFIB

## 2011-05-19 NOTE — Progress Notes (Signed)
History of Present Illness: Primary Cardiologist:  Dr. Rollene Rotunda   Nancy Bush is a 77 y.o. female who presents for evaluation of elevated heart rates.     She has a h/o rheumatoid arthritis, MVP and pulmonary HTN.  Last echo 12/06/2009:  EF 55-60%; focal basal hypertrophy, grade 2 diast dysfxn; mod MVP with moderate MR; mild to mod LAE and mild RAE; mod-severe TR with a PASP of 39.  Seen in ED in 1/12 with chest pain. DDimer was abnormal and she had a chest CT that was negative for pulmonary embolism and notable for mild dependent interstitial edema.  I saw her after that in 2/12 and Myoview was done: apical thinning, unable to rule out minimal apical ischemia, EF 77% (low risk study).  She has not been seen in the office since then.  She recently started to note fatigue.  She gets tired with any activity.  She has had some dyspnea and chest tightness.  Thought she was congested and her PCP increased her prednisone some.  No syncope.  No orthopnea, PND or edema.  No weight gain.  No palpitations.  She saw her PCP recently and had labs done.  States she was told she is not anemic and they were checking her TSH and was told her labs were normal.  She was told her HR was fast and she was told to follow up.  Past Medical History  Diagnosis Date  . MVP (mitral valve prolapse)   . Pulmonary hypertension     MILD TO MODERATE BY ECHO  . History of chest pain   . Mitral regurgitation     MILD  . Asthma   . Rheumatoid arthritis   . Shortness of breath   . H/O: hysterectomy   . LBBB (left bundle branch block)     per discharge note 2003  . PVC (premature ventricular contraction)     Current Outpatient Prescriptions  Medication Sig Dispense Refill  . Calcium Carbonate-Vitamin D (CALCIUM + D PO) Take by mouth. Taking 2 Tablet       . folic acid (FOLVITE) 1 MG tablet Take 1 mg by mouth 2 (two) times daily.       . methotrexate (RHEUMATREX) 2.5 MG tablet Take 10 mg by mouth once a week.  Caution:Chemotherapy. Protect from light.       . predniSONE (DELTASONE) 5 MG tablet Take 5 mg by mouth. 1/2 Tablet Every Other Day      . triamterene-hydrochlorothiazide (MAXZIDE-25) 37.5-25 MG per tablet Take by mouth. 1/2 Tablet Daily      . metoprolol succinate (TOPROL XL) 25 MG 24 hr tablet Take 1 tablet (25 mg total) by mouth 2 (two) times daily.  60 tablet  11  . warfarin (COUMADIN) 3 MG tablet Take 1 tablet (3 mg total) by mouth daily.  30 tablet  1    Allergies: Allergies  Allergen Reactions  . Promethazine Hcl   . Sulfonamide Derivatives   . Tequin     hallucinations     Social history:  Nonsmoker  Family history:  Mom had angina  ROS:  Please see the history of present illness.  All other systems reviewed and negative.   Vital Signs: BP 130/86  Pulse 117  Ht 4' 11.5" (1.511 m)  Wt 130 lb 12.8 oz (59.33 kg)  BMI 25.98 kg/m2  PHYSICAL EXAM: Well nourished, well developed, in no acute distress HEENT: normal Neck: no JVD Endocrine: No thyromegaly Cardiac:  normal S1,  S2; Irregularly irregular rhythm; 2-3/6 holosystolic murmur heard best along the left lower sternal border Lungs:  clear to auscultation bilaterally, no wheezing, rhonchi or rales Abd: soft, nontender, no hepatomegaly Ext: no edema Skin: warm and dry Neuro:  CNs 2-12 intact, no focal abnormalities noted Psych: Normal affect  EKG:  Atrial fibrillation, heart rate 110, left bundle branch block  ASSESSMENT AND PLAN:

## 2011-05-19 NOTE — Assessment & Plan Note (Addendum)
This is new for her.  Question if this is related to her mitral regurgitation creating significant atriopathy.  She has a CHADS2-VASc score of 2 with female gender and age over 31.  She denies any h/o significant bleeding problems.  Start coumadin 3 mg QD.  Check INR today as well as BMET and BNP.  Follow up with coumadin clinic in 1 week.  Obtain labs from PCP (including TSH and CBC).  Check Echo.  I think she will be able to tolerate a selective beta blocker.  Start Toprol XL 25 mg BID for rate control.  I spent > 25 minutes discussing the pathophysiology of atrial fibrillation with the patient and the indications, risks and benefits of coumadin anticoagulation.  Follow up with Dr. Antoine Poche in 2 weeks.  Of note, I reviewed her case with Dr. Patty Sermons (the DOD) today and he agreed with my plan.

## 2011-05-19 NOTE — Assessment & Plan Note (Signed)
She does not appear volume overloaded on exam.  She seems to be describing fatigue more than anything else.  Will check BMET and BNP today.

## 2011-05-20 ENCOUNTER — Telehealth: Payer: Self-pay | Admitting: *Deleted

## 2011-05-20 DIAGNOSIS — I4891 Unspecified atrial fibrillation: Secondary | ICD-10-CM

## 2011-05-20 LAB — BASIC METABOLIC PANEL
BUN: 22 mg/dL (ref 6–23)
CO2: 30 mEq/L (ref 19–32)
Chloride: 101 mEq/L (ref 96–112)
Creatinine, Ser: 1.2 mg/dL (ref 0.4–1.2)

## 2011-05-20 MED ORDER — FUROSEMIDE 20 MG PO TABS: 20.0000 mg | ORAL_TABLET | Freq: Every day | ORAL | Status: DC

## 2011-05-20 MED ORDER — POTASSIUM CHLORIDE 10 MEQ PO TBCR: 10.0000 meq | EXTENDED_RELEASE_TABLET | Freq: Every day | ORAL | Status: DC

## 2011-05-20 NOTE — Telephone Encounter (Signed)
Pt aware of lab results and med changes to d/c maxzide and to start furosemide 20 mg daily and to start K+ 10 meq daily. rx sent in today for pt and pt will come in 10/22 for repeat labs. Danielle Rankin

## 2011-05-26 ENCOUNTER — Ambulatory Visit (HOSPITAL_COMMUNITY): Payer: Medicare Other | Attending: Physician Assistant | Admitting: Radiology

## 2011-05-26 ENCOUNTER — Ambulatory Visit (INDEPENDENT_AMBULATORY_CARE_PROVIDER_SITE_OTHER): Payer: Medicare Other | Admitting: *Deleted

## 2011-05-26 DIAGNOSIS — Z7901 Long term (current) use of anticoagulants: Secondary | ICD-10-CM

## 2011-05-26 DIAGNOSIS — I10 Essential (primary) hypertension: Secondary | ICD-10-CM | POA: Insufficient documentation

## 2011-05-26 DIAGNOSIS — I079 Rheumatic tricuspid valve disease, unspecified: Secondary | ICD-10-CM | POA: Insufficient documentation

## 2011-05-26 DIAGNOSIS — I2789 Other specified pulmonary heart diseases: Secondary | ICD-10-CM | POA: Insufficient documentation

## 2011-05-26 DIAGNOSIS — I059 Rheumatic mitral valve disease, unspecified: Secondary | ICD-10-CM

## 2011-05-26 DIAGNOSIS — I4891 Unspecified atrial fibrillation: Secondary | ICD-10-CM

## 2011-05-26 DIAGNOSIS — R0602 Shortness of breath: Secondary | ICD-10-CM

## 2011-05-26 DIAGNOSIS — I379 Nonrheumatic pulmonary valve disorder, unspecified: Secondary | ICD-10-CM | POA: Insufficient documentation

## 2011-05-30 ENCOUNTER — Telehealth: Payer: Self-pay | Admitting: Cardiology

## 2011-05-30 NOTE — Telephone Encounter (Signed)
Spoke with pt who states that she is feeling better this afternoon though she does still have som edema at her ankles.  She has been keeping them elevated today.  Instructed pt that if when she gets up in the am and still has edema and SOB that she should take an extra Furosemide and KCL.  If this occurs she should call the office in Monday to let us know.  She was instructed to weigh herself everyday, keep feet elevated during the day and to not add salt to her food.  She states understanding and will call back if further questions.

## 2011-05-30 NOTE — Telephone Encounter (Signed)
Pt calls today b/c she felt "like I couldn't get a deep breath when I got up this morning" She states her breathing is better now.  However, she states her ankles are swollen and doesn't seem to go to the bathroom anymore than she did before she started furosemide.   She does not know her weight nor does she weigh daily. Discussed daily weights and calling for weight gain.  She denies chest pressure.   Pt has taken all of her morning medications. Mylo Red RN

## 2011-05-30 NOTE — Telephone Encounter (Signed)
Pt retaining fluid, swelling in legs, some sob

## 2011-05-30 NOTE — Telephone Encounter (Signed)
Pt has called again, having some shortness of breath.  Please call her back

## 2011-06-02 ENCOUNTER — Ambulatory Visit (INDEPENDENT_AMBULATORY_CARE_PROVIDER_SITE_OTHER): Payer: Medicare Other | Admitting: *Deleted

## 2011-06-02 DIAGNOSIS — I4891 Unspecified atrial fibrillation: Secondary | ICD-10-CM

## 2011-06-02 DIAGNOSIS — Z7901 Long term (current) use of anticoagulants: Secondary | ICD-10-CM

## 2011-06-02 LAB — POCT INR: INR: 3.2

## 2011-06-03 ENCOUNTER — Encounter: Payer: Self-pay | Admitting: Cardiology

## 2011-06-03 ENCOUNTER — Ambulatory Visit (INDEPENDENT_AMBULATORY_CARE_PROVIDER_SITE_OTHER): Payer: Medicare Other | Admitting: Cardiology

## 2011-06-03 ENCOUNTER — Telehealth: Payer: Self-pay | Admitting: Cardiology

## 2011-06-03 VITALS — BP 128/87 | HR 100 | Resp 20 | Ht 62.0 in | Wt 130.8 lb

## 2011-06-03 DIAGNOSIS — N189 Chronic kidney disease, unspecified: Secondary | ICD-10-CM | POA: Insufficient documentation

## 2011-06-03 DIAGNOSIS — I080 Rheumatic disorders of both mitral and aortic valves: Secondary | ICD-10-CM

## 2011-06-03 DIAGNOSIS — I4891 Unspecified atrial fibrillation: Secondary | ICD-10-CM

## 2011-06-03 NOTE — Telephone Encounter (Signed)
Pt calling re sob, chest pressure, wants appt today, pls call [phone]

## 2011-06-03 NOTE — Assessment & Plan Note (Signed)
Again I will be watching this very closely.

## 2011-06-03 NOTE — Progress Notes (Signed)
HPI The patient presents for followup of her newly diagnosed atrial fibrillation. She saw Tereso Newcomer PAc and was noted to be in atrial fibrillation recently.  She was having some chest discomfort and dyspnea. She was started on Coumadin and Lasix. She's had careful followup of her renal function. Her primary physician drew labs on 10/25 with a BUN of 16 and a creatinine of 1.4. Her potassium was 3.8.  She is tolerating Coumadin. However, she continues to have some dyspnea and describes 3 pillow orthopnea. She's not really noticing palpitations she's not having any presyncope or syncope. She has had some chest heaviness but it's a sensation that she can't get a deep breath. She has not been over using salt but hasn't been avoiding salty foods. She just started weighing herself. She did have an echocardiogram that demonstrated her ejection fraction to be preserved. She has tricuspid regurgitation and mild to moderate mitral regurgitation with moderate left atrial enlargement.  Allergies  Allergen Reactions  . Promethazine Hcl   . Sulfonamide Derivatives   . Tequin     hallucinations     Current Outpatient Prescriptions  Medication Sig Dispense Refill  . Calcium Carbonate-Vitamin D (CALCIUM + D PO) Take by mouth. Taking 2 Tablet       . folic acid (FOLVITE) 1 MG tablet Take 1 mg by mouth 2 (two) times daily.       . furosemide (LASIX) 20 MG tablet Take 1 tablet (20 mg total) by mouth daily.  30 tablet  11  . KLOR-CON M10 10 MEQ tablet       . methotrexate (RHEUMATREX) 2.5 MG tablet Take 10 mg by mouth once a week. Caution:Chemotherapy. Protect from light.       . metoprolol succinate (TOPROL XL) 25 MG 24 hr tablet Take 1 tablet (25 mg total) by mouth 2 (two) times daily.  60 tablet  11  . potassium chloride (KLOR-CON) 10 MEQ CR tablet Take 1 tablet (10 mEq total) by mouth daily.  30 tablet  11  . predniSONE (DELTASONE) 5 MG tablet Take 5 mg by mouth. 1/2 Tablet Every Other Day      . warfarin  (COUMADIN) 3 MG tablet Take 1 tablet (3 mg total) by mouth daily.  30 tablet  1    Past Medical History  Diagnosis Date  . MVP (mitral valve prolapse)   . Pulmonary hypertension     MILD TO MODERATE BY ECHO  . History of chest pain   . Mitral regurgitation     MILD  . Asthma   . Rheumatoid arthritis   . Shortness of breath   . H/O: hysterectomy   . LBBB (left bundle branch block)     per discharge note 2003  . PVC (premature ventricular contraction)     No past surgical history on file.  ROS:  As stated in the HPI and negative for all other systems.  PHYSICAL EXAM BP 128/87  Pulse 100  Resp 20  Ht 5\' 2"  (1.575 m)  Wt 130 lb 12.8 oz (59.33 kg)  BMI 23.92 kg/m2 GENERAL:  Well appearing HEENT:  Pupils equal round and reactive, fundi not visualized, oral mucosa unremarkable NECK:  Jugular venous distention 8cm at 45 degrees, waveform within normal limits, carotid upstroke brisk and symmetric, no bruits, no thyromegaly LYMPHATICS:  No cervical, inguinal adenopathy LUNGS:  Clear to auscultation bilaterally BACK:  No CVA tenderness CHEST:  Unremarkable HEART:  PMI not displaced or sustained,S1 and S2 within normal  limits, no S3, no clicks, no rubs, holosystolic left sternal border murmur ABD:  Flat, positive bowel sounds normal in frequency in pitch, no bruits, no rebound, no guarding, no midline pulsatile mass, no hepatomegaly, no splenomegaly EXT:  2 plus pulses throughout, no edema, no cyanosis no clubbing SKIN:  No rashes no nodules NEURO:  Cranial nerves II through XII grossly intact, motor grossly intact throughout PSYCH:  Cognitively intact, oriented to person place and time   EKG:  Atrial fibrillation, rate 87, left bundle branch block, left axis deviation  ASSESSMENT AND PLAN

## 2011-06-03 NOTE — Patient Instructions (Signed)
Please increase your Lasix (Furosemide) to 40 mg a day and your Potassium Chl to 20 MEQ a day. Continue all other medications as listed  Follow up on Tuesday with blood work. (BMP and BNP)  See Dr Antoine Poche or Tereso Newcomer on Tuesday.  Please report to the ED at [facility] if symptoms worsen.

## 2011-06-03 NOTE — Telephone Encounter (Signed)
Per pt telephone - states she didn't sleep much last night d/t SOB.  This am she is very tired and has some chest pressure like she can't take a good breathe.  SOB is better during the day.  appt scheduled for pt today at 3:30 with Dr Antoine Poche for eval.  She will call back before then if s/s worsen prior to then.

## 2011-06-03 NOTE — Assessment & Plan Note (Signed)
This is mild to moderate and will be followed.

## 2011-06-03 NOTE — Assessment & Plan Note (Signed)
Today I will increase her Lasix to 40 mg daily with 20 mmol of potassium. I'm very where that we need to follow her renal function closely. We discussed salt restriction. She'll come back in about one week for followup appointment and a basic metabolic profile. However, if she has any worsening symptoms she needs to present to the emergency room. Ultimately when we get to 3 weeks of a therapeutic INR she needs to have DC cardioversion. Of note she has some chest discomfort but I think this is related to volume and fibrillation and she had a negative stress test earlier this year.

## 2011-06-05 ENCOUNTER — Ambulatory Visit: Payer: Medicare Other | Admitting: Cardiology

## 2011-06-06 ENCOUNTER — Encounter: Payer: Medicare Other | Admitting: *Deleted

## 2011-06-06 ENCOUNTER — Ambulatory Visit: Payer: Medicare Other | Admitting: Physician Assistant

## 2011-06-08 ENCOUNTER — Telehealth: Payer: Self-pay | Admitting: Nurse Practitioner

## 2011-06-08 NOTE — Telephone Encounter (Signed)
Pt called this am stating that since going up on her lasix dose her wt has come down from 130lbs to 124lbs this am.  She cont to have lower ext edema and cont to feel weak and washed out, which has not changed in weeks.  She doesn't know what her dry wt is.  She has f/u on Tuesday w Tereso Newcomer w bmet.  I advised to cont lasix @ 40mg  daily since she still has edema and we will f/u bmet on Tuesday.  She verbalized understanding.

## 2011-06-10 ENCOUNTER — Ambulatory Visit (INDEPENDENT_AMBULATORY_CARE_PROVIDER_SITE_OTHER): Payer: Medicare Other | Admitting: *Deleted

## 2011-06-10 ENCOUNTER — Encounter: Payer: Self-pay | Admitting: Physician Assistant

## 2011-06-10 ENCOUNTER — Ambulatory Visit (INDEPENDENT_AMBULATORY_CARE_PROVIDER_SITE_OTHER): Payer: Medicare Other | Admitting: Physician Assistant

## 2011-06-10 DIAGNOSIS — R0609 Other forms of dyspnea: Secondary | ICD-10-CM

## 2011-06-10 DIAGNOSIS — I4891 Unspecified atrial fibrillation: Secondary | ICD-10-CM

## 2011-06-10 DIAGNOSIS — Z136 Encounter for screening for cardiovascular disorders: Secondary | ICD-10-CM

## 2011-06-10 DIAGNOSIS — Z7901 Long term (current) use of anticoagulants: Secondary | ICD-10-CM

## 2011-06-10 DIAGNOSIS — I5033 Acute on chronic diastolic (congestive) heart failure: Secondary | ICD-10-CM

## 2011-06-10 DIAGNOSIS — I080 Rheumatic disorders of both mitral and aortic valves: Secondary | ICD-10-CM

## 2011-06-10 LAB — POCT INR: INR: 2.9

## 2011-06-10 MED ORDER — POTASSIUM CHLORIDE CRYS ER 20 MEQ PO TBCR: 20.0000 meq | EXTENDED_RELEASE_TABLET | Freq: Every day | ORAL | Status: DC

## 2011-06-10 MED ORDER — FUROSEMIDE 40 MG PO TABS: 40.0000 mg | ORAL_TABLET | Freq: Every day | ORAL | Status: DC

## 2011-06-10 NOTE — Patient Instructions (Signed)
Your physician recommends that you schedule a follow-up appointment in: 2 WEEKS WITH DR. HOCHREIN OR SCOTT WEAVER, PA-C SAME DAY DR. Antoine Poche IS IN THE OFFICE  Your physician has recommended you make the following change in your medication: STAY ON LASIX 40 MG 1 TABLET DAILY AND POTASSIUM 20 MEQ 1 TABLET DAILY;  Your physician recommends that you return for lab work in: TODAY BMET/BNP 427.31 AFIB, 428.33 HF  PT NEEDS TO HAVE INR CHECKED EVERY WEEK UNTIL THERAPEUTIC DUE TO TRYING TO SCHEDULE FOR A DCCV  Your physician recommends that you return for lab work in: 08/17/10 REPEAT BMET 427.31, 428.33

## 2011-06-10 NOTE — Assessment & Plan Note (Signed)
Mild to mod by recent echo.

## 2011-06-10 NOTE — Assessment & Plan Note (Addendum)
I suspect her symptoms of fatigue are all related to this.  HR is controlled.  INR now therapeutic and check due today.  Will have INRs checked q week.  Follow up with me or Dr. Antoine Poche in 2 weeks.  If INR >2 for 3 weeks, schedule DCCV at that visit.    Lab Results  Component Value Date   INR 3.2 06/02/2011   INR 1.5 05/26/2011   INR 1.0 05/19/2011

## 2011-06-10 NOTE — Assessment & Plan Note (Signed)
Improved on current dose of Lasix.  I would continue Lasix 40 mg QD and K+ 20 mEq QD for now.  Check BMET and BNP today.  Repeat BMET in one week to keep close eye on renal fxn and K+.  Follow up in 2 weeks.

## 2011-06-10 NOTE — Assessment & Plan Note (Signed)
Keep close eye on renal fxn and K+ with diuresis.

## 2011-06-10 NOTE — Progress Notes (Signed)
History of Present Illness: Primary Cardiologist:  Dr. Rollene Rotunda   Nancy Bush is a 77 y.o. female who presents for follow up.     She has a h/o rheumatoid arthritis, MVP and pulmonary HTN.  Echo in 12/2009:  EF 55-60%; focal basal hypertrophy, grade 2 diast dysfxn; mod MVP with moderate MR; mild to mod LAE and mild RAE; mod-severe TR with a PASP of 39.  Seen in ED in 1/12 with chest pain. DDimer was abnormal and she had a chest CT that was negative for pulmonary embolism and notable for mild dependent interstitial edema.  I saw her after that in 2/12 and Myoview was done: apical thinning, unable to rule out minimal apical ischemia, EF 77% (low risk study).    I saw her 10/15 with fatigue.  She was noted to be in AFib.  I placed her on Toprol for rate control and coumadin.  Echo 05/26/11: Ef 55-60%, post leaflet MV prolapse, mild to mod MR, mod BAE, mod TR, PASP 40.  BNP was elevated at 328 and she had her Lasix dose increased.  She saw Dr. Antoine Poche to follow up on 10/30.  She was still volume overloaded and he increased her Lasix more.    She is down 3 lbs by our scales.  She thinks she has lost about 7 on her scales at home.  Notes chest pressure now gone.  Still propped up on 2-3 pillows at night.  No PND.  No edema.  No syncope. No palpitations.  Breathing is somewhat better.  Notes Class 2b-3 dyspnea.  Overall feels better than last week.  Still feels very weak.  She has felt this way since the summer.   Past Medical History  Diagnosis Date  . MVP (mitral valve prolapse)   . Pulmonary hypertension     MILD TO MODERATE BY ECHO  . History of chest pain   . Mitral regurgitation     MILD  . Asthma   . Rheumatoid arthritis   . Shortness of breath   . H/O: hysterectomy   . LBBB (left bundle branch block)     per discharge note 2003  . PVC (premature ventricular contraction)   . Atrial fibrillation     Current Outpatient Prescriptions  Medication Sig Dispense Refill  . Calcium  Carbonate-Vitamin D (CALCIUM + D PO) Take by mouth. Taking 2 Tablet       . folic acid (FOLVITE) 1 MG tablet Take 1 mg by mouth 2 (two) times daily.       . methotrexate (RHEUMATREX) 2.5 MG tablet Take 10 mg by mouth once a week. Caution:Chemotherapy. Protect from light.       . metoprolol succinate (TOPROL XL) 25 MG 24 hr tablet Take 1 tablet (25 mg total) by mouth 2 (two) times daily.  60 tablet  11  . predniSONE (DELTASONE) 5 MG tablet Take 5 mg by mouth. 1/2 Tablet Every Other Day      . triamterene-hydrochlorothiazide (MAXZIDE-25) 37.5-25 MG per tablet       . warfarin (COUMADIN) 3 MG tablet Take 1 tablet (3 mg total) by mouth daily.  30 tablet  1    Allergies: Allergies  Allergen Reactions  . Promethazine Hcl   . Sulfonamide Derivatives   . Tequin     hallucinations     History  Substance Use Topics  . Smoking status: Never Smoker   . Smokeless tobacco: Not on file  . Alcohol Use: No  ROS:  Please see the history of present illness.  Has had some recent migraine headaches.   Vital Signs: BP 120/68  Pulse 86  Resp 18  Ht 5\' 1"  (1.549 m)  Wt 127 lb 12.8 oz (57.97 kg)  BMI 24.15 kg/m2  PHYSICAL EXAM: Well nourished, well developed, in no acute distress HEENT: normal Neck: no JVD Cardiac:  normal S1, S2; Irregularly irregular rhythm; 2-3/6 holosystolic murmur heard best along the left lower sternal border Lungs:  clear to auscultation bilaterally, no wheezing, rhonchi or rales Abd: soft, nontender, no hepatomegaly Ext: no edema Skin: warm and dry Neuro:  CNs 2-12 intact, no focal abnormalities noted Psych: Normal affect  EKG:  Atrial fibrillation, heart rate 86, left bundle branch block  ASSESSMENT AND PLAN:

## 2011-06-11 LAB — BASIC METABOLIC PANEL
Chloride: 102 mEq/L (ref 96–112)
Creatinine, Ser: 1.2 mg/dL (ref 0.4–1.2)
Potassium: 4.3 mEq/L (ref 3.5–5.1)

## 2011-06-11 LAB — BRAIN NATRIURETIC PEPTIDE: Pro B Natriuretic peptide (BNP): 349 pg/mL — ABNORMAL HIGH (ref 0.0–100.0)

## 2011-06-18 ENCOUNTER — Other Ambulatory Visit (INDEPENDENT_AMBULATORY_CARE_PROVIDER_SITE_OTHER): Payer: Medicare Other | Admitting: *Deleted

## 2011-06-18 ENCOUNTER — Ambulatory Visit (INDEPENDENT_AMBULATORY_CARE_PROVIDER_SITE_OTHER): Payer: Medicare Other | Admitting: *Deleted

## 2011-06-18 DIAGNOSIS — I4891 Unspecified atrial fibrillation: Secondary | ICD-10-CM

## 2011-06-18 DIAGNOSIS — Z7901 Long term (current) use of anticoagulants: Secondary | ICD-10-CM

## 2011-06-18 DIAGNOSIS — I5033 Acute on chronic diastolic (congestive) heart failure: Secondary | ICD-10-CM

## 2011-06-18 LAB — BASIC METABOLIC PANEL
Chloride: 106 mEq/L (ref 96–112)
Creatinine, Ser: 1.2 mg/dL (ref 0.4–1.2)
GFR: 44.74 mL/min — ABNORMAL LOW (ref >60.00)
Potassium: 4.2 mEq/L (ref 3.5–5.1)

## 2011-06-18 LAB — POCT INR: INR: 3.5

## 2011-06-24 ENCOUNTER — Encounter: Payer: Self-pay | Admitting: Cardiology

## 2011-06-24 ENCOUNTER — Ambulatory Visit (INDEPENDENT_AMBULATORY_CARE_PROVIDER_SITE_OTHER): Payer: Medicare Other | Admitting: Cardiology

## 2011-06-24 ENCOUNTER — Ambulatory Visit (INDEPENDENT_AMBULATORY_CARE_PROVIDER_SITE_OTHER): Payer: Medicare Other | Admitting: *Deleted

## 2011-06-24 DIAGNOSIS — I4891 Unspecified atrial fibrillation: Secondary | ICD-10-CM

## 2011-06-24 DIAGNOSIS — Z7901 Long term (current) use of anticoagulants: Secondary | ICD-10-CM

## 2011-06-24 DIAGNOSIS — I5022 Chronic systolic (congestive) heart failure: Secondary | ICD-10-CM

## 2011-06-24 DIAGNOSIS — N189 Chronic kidney disease, unspecified: Secondary | ICD-10-CM

## 2011-06-24 DIAGNOSIS — I5033 Acute on chronic diastolic (congestive) heart failure: Secondary | ICD-10-CM

## 2011-06-24 DIAGNOSIS — I080 Rheumatic disorders of both mitral and aortic valves: Secondary | ICD-10-CM

## 2011-06-24 LAB — POCT INR: INR: 2.2

## 2011-06-24 NOTE — Assessment & Plan Note (Signed)
As above I will check a basic metabolic profile in one month.

## 2011-06-24 NOTE — Assessment & Plan Note (Signed)
She has mitral valve prolapse with moderate regurgitation. I will treat this symptomatically and followup with physical exams and consider repeat echocardiography in the future.

## 2011-06-24 NOTE — Progress Notes (Signed)
HPI   The patient presents for followup of mitral regurgitation and atrial fibrillation.  Since she was last seen she feels much better. She did have her dose of diuretic increased. Her labs were followed up and her chemistry was stable. She has more energy. She has less dyspnea. She still sleeping somewhat propped up. She's had a consistent weight loss. She's had no chest pressure, neck or arm discomfort. She's had no palpitations, presyncope or syncope. She's had no new PND.  Allergies  Allergen Reactions  . Promethazine Hcl   . Sulfonamide Derivatives   . Tequin     hallucinations     Current Outpatient Prescriptions  Medication Sig Dispense Refill  . Calcium Carbonate-Vitamin D (CALCIUM + D PO) Take by mouth. Taking 2 Tablet       . folic acid (FOLVITE) 1 MG tablet Take 1 mg by mouth 2 (two) times daily.       . furosemide (LASIX) 40 MG tablet Take 1 tablet (40 mg total) by mouth daily.  30 tablet  11  . methotrexate (RHEUMATREX) 2.5 MG tablet Take 10 mg by mouth once a week. Caution:Chemotherapy. Protect from light.       . metoprolol succinate (TOPROL XL) 25 MG 24 hr tablet Take 1 tablet (25 mg total) by mouth 2 (two) times daily.  60 tablet  11  . potassium chloride SA (KLOR-CON M20) 20 MEQ tablet Take 1 tablet (20 mEq total) by mouth daily.  30 tablet  11  . predniSONE (DELTASONE) 5 MG tablet Take 5 mg by mouth. 1/2 Tablet Every Other Day      . triamterene-hydrochlorothiazide (MAXZIDE-25) 37.5-25 MG per tablet       . warfarin (COUMADIN) 3 MG tablet Take 1 tablet (3 mg total) by mouth daily.  30 tablet  1    Past Medical History  Diagnosis Date  . MVP (mitral valve prolapse)   . Pulmonary hypertension     MILD TO MODERATE BY ECHO  . History of chest pain   . Mitral regurgitation     MILD  . Asthma   . Rheumatoid arthritis   . Shortness of breath   . H/O: hysterectomy   . LBBB (left bundle branch block)     per discharge note 2003  . PVC (premature ventricular  contraction)   . Atrial fibrillation     Past Surgical History  Procedure Date  . Cholecystectomy   . Ovarian cyst removal   . Hysterectomy   . Appendectomy   . Cataract extraction     ROS:  As stated in the HPI and negative for all other systems.  PHYSICAL EXAM BP 102/64  Pulse 99  Ht 5' (1.524 m)  Wt 124 lb (56.246 kg)  BMI 24.22 kg/m2 GENERAL:  Well appearing HEENT:  Pupils equal round and reactive, fundi not visualized, oral mucosa unremarkable NECK:  No jugular venous distention, waveform within normal limits, carotid upstroke brisk and symmetric, no bruits, no thyromegaly LYMPHATICS:  No cervical, inguinal adenopathy LUNGS:  Clear to auscultation bilaterally BACK:  No CVA tenderness CHEST:  Unremarkable HEART:  PMI not displaced or sustained,S1 and S2 within normal limits, no S3, no clicks, no rubs, holosystolic anterior murmur, irregular ABD:  Flat, positive bowel sounds normal in frequency in pitch, no bruits, no rebound, no guarding, no midline pulsatile mass, no hepatomegaly, no splenomegaly EXT:  2 plus pulses throughout, no edema, no cyanosis no clubbing SKIN:  No rashes no nodules NEURO:  Cranial nerves II through XII grossly intact, motor grossly intact throughout [facility]:  Cognitively intact, oriented to person place and time  EKG: Atrial fibrillation, rate 80, left anterior fascicular block, left axis deviation, poor anterior R wave progression, no acute ST-T wave changes to.  06/24/2011    ASSESSMENT AND PLAN

## 2011-06-24 NOTE — Assessment & Plan Note (Signed)
She is still in atrial fibrillation. However, she feels much better. Therefore, there is no convincing evidence that treating her with cardioversion would improve her symptoms. She would need to continue anticoagulation. At this point I think the key is rate control and volume management. She had her labs checked recently and her BUN and creatinine are stable. For now she will continue on the meds as listed.

## 2011-06-24 NOTE — Assessment & Plan Note (Signed)
I think her volume is well managed. We discussed heart failure with preserved ejection fraction. No change in therapy is indicated at this point.

## 2011-06-24 NOTE — Patient Instructions (Signed)
Please have blood work in one month.  Continue current medications as listed.  Follow up with Dr Antoine Poche in 3 months.

## 2011-07-01 ENCOUNTER — Ambulatory Visit (INDEPENDENT_AMBULATORY_CARE_PROVIDER_SITE_OTHER): Payer: Medicare Other | Admitting: *Deleted

## 2011-07-01 DIAGNOSIS — Z7901 Long term (current) use of anticoagulants: Secondary | ICD-10-CM

## 2011-07-01 DIAGNOSIS — I4891 Unspecified atrial fibrillation: Secondary | ICD-10-CM

## 2011-07-01 LAB — POCT INR: INR: 3.3

## 2011-07-15 ENCOUNTER — Ambulatory Visit (INDEPENDENT_AMBULATORY_CARE_PROVIDER_SITE_OTHER): Payer: Medicare Other | Admitting: *Deleted

## 2011-07-15 DIAGNOSIS — I4891 Unspecified atrial fibrillation: Secondary | ICD-10-CM

## 2011-07-15 DIAGNOSIS — Z7901 Long term (current) use of anticoagulants: Secondary | ICD-10-CM

## 2011-07-15 LAB — POCT INR: INR: 1.8

## 2011-07-17 ENCOUNTER — Other Ambulatory Visit: Payer: Self-pay | Admitting: Physician Assistant

## 2011-07-17 NOTE — Telephone Encounter (Signed)
Needs coumadin refilled 

## 2011-07-17 NOTE — Telephone Encounter (Signed)
Needs coumadin refilled

## 2011-07-23 ENCOUNTER — Other Ambulatory Visit (INDEPENDENT_AMBULATORY_CARE_PROVIDER_SITE_OTHER): Payer: Medicare Other | Admitting: *Deleted

## 2011-07-23 DIAGNOSIS — I5022 Chronic systolic (congestive) heart failure: Secondary | ICD-10-CM

## 2011-07-23 LAB — BASIC METABOLIC PANEL
Chloride: 108 mEq/L (ref 96–112)
GFR: 46.03 mL/min — ABNORMAL LOW (ref >60.00)
Potassium: 4.2 mEq/L (ref 3.5–5.1)
Sodium: 143 mEq/L (ref 135–145)

## 2011-07-25 ENCOUNTER — Other Ambulatory Visit: Payer: Self-pay | Admitting: Physician Assistant

## 2011-07-31 ENCOUNTER — Ambulatory Visit (INDEPENDENT_AMBULATORY_CARE_PROVIDER_SITE_OTHER): Payer: Medicare Other | Admitting: *Deleted

## 2011-07-31 DIAGNOSIS — I4891 Unspecified atrial fibrillation: Secondary | ICD-10-CM

## 2011-07-31 DIAGNOSIS — Z7901 Long term (current) use of anticoagulants: Secondary | ICD-10-CM

## 2011-08-12 ENCOUNTER — Telehealth: Payer: Self-pay | Admitting: Cardiology

## 2011-08-12 NOTE — Telephone Encounter (Signed)
Pt informed of guidelines. She does not need SBE prior to dental cleaning. Pt verbalizes understanding.

## 2011-08-12 NOTE — Telephone Encounter (Signed)
New problem Pt wants to know if she should take antibiotic before she goes to dentist-tomorrow please let her know

## 2011-08-21 ENCOUNTER — Ambulatory Visit (INDEPENDENT_AMBULATORY_CARE_PROVIDER_SITE_OTHER): Payer: Medicare Other | Admitting: *Deleted

## 2011-08-21 DIAGNOSIS — Z7901 Long term (current) use of anticoagulants: Secondary | ICD-10-CM

## 2011-08-21 DIAGNOSIS — I4891 Unspecified atrial fibrillation: Secondary | ICD-10-CM

## 2011-09-18 ENCOUNTER — Ambulatory Visit (INDEPENDENT_AMBULATORY_CARE_PROVIDER_SITE_OTHER): Payer: Medicare Other

## 2011-09-18 DIAGNOSIS — Z7901 Long term (current) use of anticoagulants: Secondary | ICD-10-CM | POA: Diagnosis not present

## 2011-09-18 DIAGNOSIS — I4891 Unspecified atrial fibrillation: Secondary | ICD-10-CM

## 2011-09-19 ENCOUNTER — Other Ambulatory Visit: Payer: Self-pay | Admitting: *Deleted

## 2011-09-19 MED ORDER — WARFARIN SODIUM 3 MG PO TABS: 3.0000 mg | ORAL_TABLET | ORAL | Status: DC

## 2011-09-23 ENCOUNTER — Other Ambulatory Visit: Payer: Self-pay | Admitting: *Deleted

## 2011-09-23 MED ORDER — WARFARIN SODIUM 3 MG PO TABS: 3.0000 mg | ORAL_TABLET | ORAL | Status: DC

## 2011-09-29 ENCOUNTER — Encounter: Payer: Self-pay | Admitting: Cardiology

## 2011-09-29 ENCOUNTER — Ambulatory Visit (INDEPENDENT_AMBULATORY_CARE_PROVIDER_SITE_OTHER): Payer: Medicare Other | Admitting: Cardiology

## 2011-09-29 VITALS — BP 100/60 | HR 91 | Ht 60.0 in | Wt 121.0 lb

## 2011-09-29 DIAGNOSIS — I080 Rheumatic disorders of both mitral and aortic valves: Secondary | ICD-10-CM

## 2011-09-29 DIAGNOSIS — I5033 Acute on chronic diastolic (congestive) heart failure: Secondary | ICD-10-CM

## 2011-09-29 DIAGNOSIS — N189 Chronic kidney disease, unspecified: Secondary | ICD-10-CM | POA: Diagnosis not present

## 2011-09-29 DIAGNOSIS — I4891 Unspecified atrial fibrillation: Secondary | ICD-10-CM | POA: Diagnosis not present

## 2011-09-29 NOTE — Assessment & Plan Note (Signed)
She had blood work done recently that demonstrated stable renal function with a creatinine clearance greater than 40.

## 2011-09-29 NOTE — Assessment & Plan Note (Signed)
She seems to be euvolemic.  At this point, no change in therapy is indicated.  We have reviewed salt and fluid restrictions.  No further cardiovascular testing is indicated.   

## 2011-09-29 NOTE — Assessment & Plan Note (Signed)
This was moderate at the echo last year.  I will likely repeat this at a 12 month interval.

## 2011-09-29 NOTE — Assessment & Plan Note (Signed)
She is tolerating this rhythm. I'm going to place a 24-hour monitor to make sure she has reasonable rate control. She's also interested in considering Xarelto and I will have her check the cost with her insurance company.

## 2011-09-29 NOTE — Patient Instructions (Signed)
Please continue current medications as listed  Your physician has recommended that you wear a holter monitor. Holter monitors are medical devices that record the heart's electrical activity. Doctors most often use these monitors to diagnose arrhythmias. Arrhythmias are problems with the speed or rhythm of the heartbeat. The monitor is a small, portable device. You can wear one while you do your normal daily activities. This is usually used to diagnose what is causing palpitations/syncope (passing out).  Xarelto  Follow up in 6 months with Dr Antoine Poche.  You will receive a letter in the mail 2 months before you are due.  Please call us when you receive this letter to schedule your follow up appointment.

## 2011-09-29 NOTE — Progress Notes (Signed)
HPI   The patient presents for followup of mitral regurgitation and atrial fibrillation.  The patient denies any new symptoms such as chest discomfort, neck or arm discomfort. There has been no new shortness of breath, PND or orthopnea. There have been no reported palpitations, presyncope or syncope.  She has occasional fatigue with some days being better than others.   Allergies  Allergen Reactions  . Promethazine Hcl   . Sulfonamide Derivatives   . Tequin     hallucinations     Current Outpatient Prescriptions  Medication Sig Dispense Refill  . Calcium Carbonate-Vitamin D (CALCIUM + D PO) Take by mouth. Taking 2 Tablet       . folic acid (FOLVITE) 1 MG tablet Take 1 mg by mouth 2 (two) times daily.       . furosemide (LASIX) 40 MG tablet Take 1 tablet (40 mg total) by mouth daily.  30 tablet  11  . methotrexate (RHEUMATREX) 2.5 MG tablet Take 10 mg by mouth once a week. Caution:Chemotherapy. Protect from light.       . metoprolol succinate (TOPROL XL) 25 MG 24 hr tablet Take 1 tablet (25 mg total) by mouth 2 (two) times daily.  60 tablet  11  . potassium chloride SA (KLOR-CON M20) 20 MEQ tablet Take 1 tablet (20 mEq total) by mouth daily.  30 tablet  11  . predniSONE (DELTASONE) 5 MG tablet Take 5 mg by mouth. 1/2 Tablet Every Other Day      . warfarin (COUMADIN) 3 MG tablet Take 1 tablet (3 mg total) by mouth as directed.  30 tablet  3    Past Medical History  Diagnosis Date  . MVP (mitral valve prolapse)   . Pulmonary hypertension     MILD TO MODERATE BY ECHO  . History of chest pain   . Mitral regurgitation     MILD  . Asthma   . Rheumatoid arthritis   . Shortness of breath   . H/O: hysterectomy   . LBBB (left bundle branch block)     per discharge note 2003  . PVC (premature ventricular contraction)   . Atrial fibrillation     Past Surgical History  Procedure Date  . Cholecystectomy   . Ovarian cyst removal   . Hysterectomy   . Appendectomy   . Cataract  extraction     ROS:  As stated in the HPI and negative for all other systems.  PHYSICAL EXAM BP 100/60  Pulse 91  Ht 5' (1.524 m)  Wt 121 lb (54.885 kg)  BMI 23.63 kg/m2 GENERAL:  Well appearing HEENT:  Pupils equal round and reactive, fundi not visualized, oral mucosa unremarkable NECK:  No jugular venous distention, waveform within normal limits, carotid upstroke brisk and symmetric, no bruits, no thyromegaly LYMPHATICS:  No cervical, inguinal adenopathy LUNGS:  Clear to auscultation bilaterally BACK:  No CVA tenderness CHEST:  Unremarkable HEART:  PMI not displaced or sustained,S1 and S2 within normal limits, no S3, no clicks, no rubs, holosystolic anterior murmur, irregular ABD:  Flat, positive bowel sounds normal in frequency in pitch, no bruits, no rebound, no guarding, no midline pulsatile mass, no hepatomegaly, no splenomegaly EXT:  2 plus pulses throughout, no edema, no cyanosis no clubbing SKIN:  No rashes no nodules NEURO:  Cranial nerves II through XII grossly intact, motor grossly intact throughout PSYCH:  Cognitively intact, oriented to person place and time   EKG:  Atrial fibrillation with rate 91, right axis  deviation, poor anterior R wave progression, interventricular conduction delay, no acute ST-T wave changes 09/29/2011   ASSESSMENT AND PLAN

## 2011-09-30 ENCOUNTER — Telehealth: Payer: Self-pay | Admitting: Cardiology

## 2011-09-30 NOTE — Telephone Encounter (Signed)
New Msg: Pt calling wanting to speak with nurse/md to find out when pt needs to take heart monitor off/disconnect it. Please return pt call to discuss further.

## 2011-09-30 NOTE — Telephone Encounter (Signed)
Monitor was placed yesterday at approximately 3 pm.  Pt will remove monitor and return it this afternoon

## 2011-10-15 ENCOUNTER — Other Ambulatory Visit: Payer: Medicare Other | Admitting: *Deleted

## 2011-10-15 ENCOUNTER — Ambulatory Visit: Payer: Medicare Other | Admitting: Cardiology

## 2011-10-16 ENCOUNTER — Ambulatory Visit (INDEPENDENT_AMBULATORY_CARE_PROVIDER_SITE_OTHER): Payer: Medicare Other | Admitting: *Deleted

## 2011-10-16 DIAGNOSIS — Z7901 Long term (current) use of anticoagulants: Secondary | ICD-10-CM | POA: Diagnosis not present

## 2011-10-16 DIAGNOSIS — I4891 Unspecified atrial fibrillation: Secondary | ICD-10-CM | POA: Diagnosis not present

## 2011-10-16 LAB — POCT INR: INR: 1.6

## 2011-10-20 DIAGNOSIS — M069 Rheumatoid arthritis, unspecified: Secondary | ICD-10-CM | POA: Diagnosis not present

## 2011-10-20 DIAGNOSIS — M76899 Other specified enthesopathies of unspecified lower limb, excluding foot: Secondary | ICD-10-CM | POA: Diagnosis not present

## 2011-10-20 DIAGNOSIS — M159 Polyosteoarthritis, unspecified: Secondary | ICD-10-CM | POA: Diagnosis not present

## 2011-10-29 ENCOUNTER — Telehealth: Payer: Self-pay | Admitting: Cardiology

## 2011-10-29 NOTE — Telephone Encounter (Signed)
LMTCB ./CY 

## 2011-10-29 NOTE — Telephone Encounter (Signed)
New Msg: Pt calling wanting to speak with nurse about possibly seeing Dr. Eden Emms as oppose to Dr. Antoine Poche. Please return pt call to discuss further.

## 2011-10-29 NOTE — Telephone Encounter (Signed)
(  Re-routed to Dr. Eden Emms)

## 2011-10-29 NOTE — Telephone Encounter (Signed)
APPT MADE FOR 11-28-11  AT 11:15  WITH DR NISHAN./CY

## 2011-10-29 NOTE — Telephone Encounter (Signed)
Ok by me

## 2011-10-29 NOTE — Telephone Encounter (Signed)
This needs to be routed to Dr Eden Emms for his approval.  It is OK with Dr Antoine Poche

## 2011-10-30 ENCOUNTER — Ambulatory Visit (INDEPENDENT_AMBULATORY_CARE_PROVIDER_SITE_OTHER): Payer: Medicare Other

## 2011-10-30 DIAGNOSIS — I4891 Unspecified atrial fibrillation: Secondary | ICD-10-CM | POA: Diagnosis not present

## 2011-10-30 DIAGNOSIS — Z7901 Long term (current) use of anticoagulants: Secondary | ICD-10-CM | POA: Diagnosis not present

## 2011-11-03 DIAGNOSIS — R21 Rash and other nonspecific skin eruption: Secondary | ICD-10-CM | POA: Diagnosis not present

## 2011-11-20 ENCOUNTER — Ambulatory Visit (INDEPENDENT_AMBULATORY_CARE_PROVIDER_SITE_OTHER): Payer: Medicare Other | Admitting: Pharmacist

## 2011-11-20 DIAGNOSIS — I4891 Unspecified atrial fibrillation: Secondary | ICD-10-CM

## 2011-11-20 DIAGNOSIS — Z7901 Long term (current) use of anticoagulants: Secondary | ICD-10-CM | POA: Diagnosis not present

## 2011-11-20 LAB — POCT INR: INR: 1.8

## 2011-11-28 ENCOUNTER — Ambulatory Visit (INDEPENDENT_AMBULATORY_CARE_PROVIDER_SITE_OTHER): Payer: Medicare Other | Admitting: Cardiovascular Disease

## 2011-11-28 ENCOUNTER — Encounter: Payer: Self-pay | Admitting: Cardiovascular Disease

## 2011-11-28 VITALS — BP 110/72 | HR 98 | Resp 18 | Ht 60.0 in | Wt 120.0 lb

## 2011-11-28 DIAGNOSIS — I4891 Unspecified atrial fibrillation: Secondary | ICD-10-CM | POA: Diagnosis not present

## 2011-11-28 DIAGNOSIS — I080 Rheumatic disorders of both mitral and aortic valves: Secondary | ICD-10-CM

## 2011-11-28 DIAGNOSIS — I059 Rheumatic mitral valve disease, unspecified: Secondary | ICD-10-CM | POA: Diagnosis not present

## 2011-11-28 DIAGNOSIS — I341 Nonrheumatic mitral (valve) prolapse: Secondary | ICD-10-CM

## 2011-11-28 NOTE — Patient Instructions (Signed)
Your physician recommends that you schedule a follow-up appointment in: 2 MONTHS WITH DR [facility] Your physician recommends that you continue on your current medications as directed. Please refer to the Current Medication list given to you today. Your physician has requested that you have an echocardiogram. Echocardiography is a painless test that uses sound waves to create images of your heart. It provides your doctor with information about the size and shape of your heart and how well your heart's chambers and valves are working. This procedure takes approximately one hour. There are no restrictions for this procedure. DX  MVP Your physician has recommended that you wear a holter monitor. Holter monitors are medical devices that record the heart's electrical activity. Doctors most often use these monitors to diagnose arrhythmias. Arrhythmias are problems with the speed or rhythm of the heartbeat. The monitor is a small, portable device. You can wear one while you do your normal daily activities. This is usually used to diagnose what is causing palpitations/syncope (passing out). 24 HOUR HOLTER  DX  AFIB

## 2011-11-28 NOTE — Assessment & Plan Note (Signed)
Anteriorly directed murmur is impressive presumed from posterior leaflet prolapse.  F/U echo to reassess severity and risk of CHF, LA size and EF

## 2011-11-28 NOTE — Assessment & Plan Note (Signed)
Relatively asymptomatic, tolerating coumadin and beta blocker.  Previous ECG;s with fibrillation and not just flutter.  Agree with rate control and anticoagulation.  She does not appear to have had Holter.  Will reorder as she may need more lopresser for rate control.  F/U in coumadin clinic for INR;s.

## 2011-11-28 NOTE — Progress Notes (Addendum)
Patient ID: Nancy Bush, female   DOB: 12-23-1925, 78 y.o.   MRN: [id number] 78 yo patient of Dr Antoine Poche.  Just saw him in February.  Apparently wanted a second opinion about her afib.  Chronic afib.  Rate control and anticoagulation has been the strategy.  No bleeding problems on coumadin.  Was supposed to have holter to see about rate control but I cant see that it was done.  Echo done 05/26/11  Showed normal EF and posterior leaflet prolapse with mild to moderate MR.  LA moderately dilated at 48mm.  On review of ECGs had also developed LBBB since 2011  ECG 05/3011 shows afib and today more organized flutter. She denies dyspnea, palpitatoins or SSCP.  She has some fatigue.  Compliant with meds.    ROS: Denies fever, malais, weight loss, blurry vision, decreased visual acuity, cough, sputum, SOB, hemoptysis, pleuritic pain, palpitaitons, heartburn, abdominal pain, melena, lower extremity edema, claudication, or rash.  All other systems reviewed and negative   General: Affect appropriate Healthy:  appears stated age HEENT: normal Neck supple with no adenopathy JVP normal no bruits no thyromegaly Lungs clear with no wheezing and good diaphragmatic motion Heart:  S1/S2 loud MR murmur directly anteriorly from posterior prolapse no rub, gallop or click PMI normal Abdomen: benighn, BS positve, no tenderness, no AAA no bruit.  No HSM or HJR Distal pulses intact with no bruits No edema Neuro non-focal Skin warm and dry small varicosities LEls with stasis No muscular weakness  Medications Current Outpatient Prescriptions  Medication Sig Dispense Refill  . Calcium Carbonate-Vitamin D (CALCIUM + D PO) Take by mouth. Taking 2 Tablet       . folic acid (FOLVITE) 1 MG tablet Take 1 mg by mouth 2 (two) times daily.       . furosemide (LASIX) 40 MG tablet Take 1 tablet (40 mg total) by mouth daily.  30 tablet  11  . methotrexate (RHEUMATREX) 2.5 MG tablet Take 10 mg by mouth once a week.  Caution:Chemotherapy. Protect from light.       . metoprolol succinate (TOPROL XL) 25 MG 24 hr tablet Take 1 tablet (25 mg total) by mouth 2 (two) times daily.  60 tablet  11  . potassium chloride SA (KLOR-CON M20) 20 MEQ tablet Take 1 tablet (20 mEq total) by mouth daily.  30 tablet  11  . predniSONE (DELTASONE) 5 MG tablet Take 5 mg by mouth. 1/2 Tablet Every Other Day      . warfarin (COUMADIN) 3 MG tablet Take 1 tablet (3 mg total) by mouth as directed.  30 tablet  3    Allergies Promethazine hcl; Sulfonamide derivatives; and Tequin  Family History: No family history on file.  Social History: History   Social History  . Marital Status: Single    Spouse Name: N/A    Number of Children: N/A  . Years of Education: N/A   Occupational History  . Not on file.   Social History Main Topics  . Smoking status: Never Smoker   . Smokeless tobacco: Not on file  . Alcohol Use: No  . Drug Use: Not on file  . Sexually Active: Not on file   Other Topics Concern  . Not on file   Social History Narrative  . No narrative on file    Electrocardiogram:  Atrial flutter rate 106 LAD LBBB.  06/24/11 Afib rate 80 LAD LBBB  Assessment and Plan

## 2011-11-28 NOTE — Progress Notes (Signed)
Patient ID: Nancy Bush, female   DOB: [birth year], 78 y.o.   MRN: [id number]

## 2011-12-10 ENCOUNTER — Other Ambulatory Visit (HOSPITAL_COMMUNITY): Payer: Medicare Other

## 2011-12-11 ENCOUNTER — Ambulatory Visit (HOSPITAL_COMMUNITY): Payer: Medicare Other | Attending: Cardiovascular Disease

## 2011-12-11 ENCOUNTER — Encounter (INDEPENDENT_AMBULATORY_CARE_PROVIDER_SITE_OTHER): Payer: Medicare Other

## 2011-12-11 ENCOUNTER — Other Ambulatory Visit: Payer: Self-pay

## 2011-12-11 ENCOUNTER — Ambulatory Visit (INDEPENDENT_AMBULATORY_CARE_PROVIDER_SITE_OTHER): Payer: Medicare Other | Admitting: *Deleted

## 2011-12-11 DIAGNOSIS — I4891 Unspecified atrial fibrillation: Secondary | ICD-10-CM | POA: Diagnosis not present

## 2011-12-11 DIAGNOSIS — I341 Nonrheumatic mitral (valve) prolapse: Secondary | ICD-10-CM

## 2011-12-11 DIAGNOSIS — Z7901 Long term (current) use of anticoagulants: Secondary | ICD-10-CM | POA: Diagnosis not present

## 2011-12-11 DIAGNOSIS — I059 Rheumatic mitral valve disease, unspecified: Secondary | ICD-10-CM | POA: Insufficient documentation

## 2011-12-11 DIAGNOSIS — I079 Rheumatic tricuspid valve disease, unspecified: Secondary | ICD-10-CM | POA: Insufficient documentation

## 2011-12-11 LAB — POCT INR: INR: 2.6

## 2011-12-15 ENCOUNTER — Telehealth: Payer: Self-pay | Admitting: Cardiovascular Disease

## 2011-12-15 NOTE — Telephone Encounter (Signed)
New Problem: ° ° ° °Patient returned your call.  Please call back. °

## 2011-12-16 ENCOUNTER — Telehealth: Payer: Self-pay | Admitting: *Deleted

## 2011-12-16 NOTE — Telephone Encounter (Signed)
PT AWARE OF HOLTER RESULTS./CY 

## 2011-12-16 NOTE — Telephone Encounter (Signed)
SEE ECHO REPORT .Nancy Bush

## 2012-01-08 ENCOUNTER — Ambulatory Visit (INDEPENDENT_AMBULATORY_CARE_PROVIDER_SITE_OTHER): Payer: Medicare Other | Admitting: *Deleted

## 2012-01-08 DIAGNOSIS — I4891 Unspecified atrial fibrillation: Secondary | ICD-10-CM

## 2012-01-08 DIAGNOSIS — Z7901 Long term (current) use of anticoagulants: Secondary | ICD-10-CM

## 2012-01-08 LAB — POCT INR: INR: 2.3

## 2012-01-17 ENCOUNTER — Other Ambulatory Visit: Payer: Self-pay | Admitting: Cardiology

## 2012-01-21 ENCOUNTER — Other Ambulatory Visit: Payer: Self-pay | Admitting: Family Medicine

## 2012-01-21 DIAGNOSIS — M159 Polyosteoarthritis, unspecified: Secondary | ICD-10-CM | POA: Diagnosis not present

## 2012-01-21 DIAGNOSIS — M069 Rheumatoid arthritis, unspecified: Secondary | ICD-10-CM | POA: Diagnosis not present

## 2012-01-21 DIAGNOSIS — Z1231 Encounter for screening mammogram for malignant neoplasm of breast: Secondary | ICD-10-CM

## 2012-01-21 DIAGNOSIS — M76899 Other specified enthesopathies of unspecified lower limb, excluding foot: Secondary | ICD-10-CM | POA: Diagnosis not present

## 2012-01-29 ENCOUNTER — Ambulatory Visit (INDEPENDENT_AMBULATORY_CARE_PROVIDER_SITE_OTHER): Payer: Medicare Other

## 2012-01-29 ENCOUNTER — Ambulatory Visit (INDEPENDENT_AMBULATORY_CARE_PROVIDER_SITE_OTHER): Payer: Medicare Other | Admitting: Cardiology

## 2012-01-29 ENCOUNTER — Encounter: Payer: Self-pay | Admitting: Cardiology

## 2012-01-29 ENCOUNTER — Encounter: Payer: Self-pay | Admitting: *Deleted

## 2012-01-29 VITALS — BP 100/60 | HR 80 | Ht 60.0 in | Wt 117.8 lb

## 2012-01-29 DIAGNOSIS — I5033 Acute on chronic diastolic (congestive) heart failure: Secondary | ICD-10-CM | POA: Diagnosis not present

## 2012-01-29 DIAGNOSIS — I4891 Unspecified atrial fibrillation: Secondary | ICD-10-CM

## 2012-01-29 DIAGNOSIS — I080 Rheumatic disorders of both mitral and aortic valves: Secondary | ICD-10-CM

## 2012-01-29 DIAGNOSIS — R5381 Other malaise: Secondary | ICD-10-CM | POA: Diagnosis not present

## 2012-01-29 DIAGNOSIS — Z7901 Long term (current) use of anticoagulants: Secondary | ICD-10-CM | POA: Diagnosis not present

## 2012-01-29 DIAGNOSIS — R5383 Other fatigue: Secondary | ICD-10-CM

## 2012-01-29 LAB — POCT INR: INR: 2.4

## 2012-01-29 NOTE — Progress Notes (Signed)
HPI   The patient presents for followup of mitral regurgitation and atrial fibrillation.  Her biggest complaint has been profound fatigue. She actually saw Dr. Eden Emms for a second opinion.he ordered another transthoracic ultrasound which demonstrates that her ejection fraction is still well preserved but that she has moderately severe mitral regurgitation. He had no other significant suggestions were markedly different for her. However, she still dismayed because she's so she. She doesn't sleep well every night.  However, she doesn't necessarily correlate this with her fatigue.  She doesn't have severe shortness of breath, PND or orthopnea. She doesn't have any chest pressure, neck or arm discomfort. She will occasionally get some dyspnea.    Allergies  Allergen Reactions  . Promethazine Hcl   . Sulfonamide Derivatives   . Tequin     hallucinations     Current Outpatient Prescriptions  Medication Sig Dispense Refill  . Calcium Carbonate-Vitamin D (CALCIUM + D PO) Take by mouth. Taking 2 Tablet       . furosemide (LASIX) 40 MG tablet Take 1 tablet (40 mg total) by mouth daily.  30 tablet  11  . methotrexate (RHEUMATREX) 2.5 MG tablet Take 10 mg by mouth once a week. Caution:Chemotherapy. Protect from light.       . metoprolol succinate (TOPROL XL) 25 MG 24 hr tablet Take 1 tablet (25 mg total) by mouth 2 (two) times daily.  60 tablet  11  . potassium chloride SA (KLOR-CON M20) 20 MEQ tablet Take 1 tablet (20 mEq total) by mouth daily.  30 tablet  11  . predniSONE (DELTASONE) 5 MG tablet Take 5 mg by mouth. 1/2 Tablet Every Other Day      . warfarin (COUMADIN) 3 MG tablet Take 1 tablet (3 mg total) by mouth as directed.  30 tablet  3  . folic acid (FOLVITE) 1 MG tablet Take 1 mg by mouth 2 (two) times daily.       Marland Kitchen DISCONTD: warfarin (COUMADIN) 3 MG tablet TAKE 1 TABLET BY MOUTH AS DIRECTED  35 tablet  3    Past Medical History  Diagnosis Date  . MVP (mitral valve prolapse)   .  Pulmonary hypertension     MILD TO MODERATE BY ECHO  . History of chest pain   . Mitral regurgitation     MILD  . Asthma   . Rheumatoid arthritis   . Shortness of breath   . H/O: hysterectomy   . LBBB (left bundle branch block)     per discharge note 2003  . PVC (premature ventricular contraction)   . Atrial fibrillation     Past Surgical History  Procedure Date  . Cholecystectomy   . Ovarian cyst removal   . Hysterectomy   . Appendectomy   . Cataract extraction     ROS:  As stated in the HPI and negative for all other systems.  PHYSICAL EXAM BP 100/60  Pulse 80  Ht 5' (1.524 m)  Wt 117 lb 12.8 oz (53.434 kg)  BMI 23.01 kg/m2 GENERAL:  Well appearing HEENT:  Pupils equal round and reactive, fundi not visualized, oral mucosa unremarkable NECK:  No jugular venous distention, waveform within normal limits, carotid upstroke brisk and symmetric, no bruits, no thyromegaly LYMPHATICS:  No cervical, inguinal adenopathy LUNGS:  Clear to auscultation bilaterally BACK:  No CVA tenderness CHEST:  Unremarkable HEART:  PMI not displaced or sustained,S1 and S2 within normal limits, no S3, no clicks, no rubs, holosystolic anterior murmur, irregular  ABD:  Flat, positive bowel sounds normal in frequency in pitch, no bruits, no rebound, no guarding, no midline pulsatile mass, no hepatomegaly, no splenomegaly EXT:  2 plus pulses throughout, no edema, no cyanosis no clubbing SKIN:  Flat red areas on bilateral lower legs. NEURO:  Cranial nerves II through XII grossly intact, motor grossly intact throughout PSYCH:  Cognitively intact, oriented to person place and time    ASSESSMENT AND PLAN

## 2012-01-29 NOTE — Assessment & Plan Note (Signed)
This is her predominant complaint. The etiology of this is not entirely clear. It could not be convinced that this is related to her arrhythmia or regurgitation.  In the past her thyroid has been normal and she's not been anemic. Her hobby address in the other issues below and as these are managed for considered for management we will need to see if this affects her fatigue.

## 2012-01-29 NOTE — Assessment & Plan Note (Signed)
Her regurgitation is moderately severe to severe. Her EF was well-preserved. She would consider surgical treatment of this. Therefore, I am going to go ahead and get a trans-esophageal ultrasound to further quantify this.

## 2012-01-29 NOTE — Assessment & Plan Note (Signed)
She was in flutter on a recent monitor with rate control. However, she clearly had fibrillation as well. At this point I'm not convinced flutter ablation would be the answer though it might reduce the arrhythmia burden. All further evaluate this in the context of treating her regurgitation.

## 2012-01-29 NOTE — Patient Instructions (Addendum)
The current medical regimen is effective;  continue present plan and medications.  Your physician has requested that you have a TEE. During a TEE, sound waves are used to create images of your heart. It provides your doctor with information about the size and shape of your heart and how well your heart's chambers and valves are working. In this test, a transducer is attached to the end of a flexible tube that's guided down your throat and into your esophagus (the tube leading from you mouth to your stomach) to get a more detailed image of your heart. You are not awake for the procedure. Please see the instruction sheet given to you today. For further information please visit [email or web address]   Follow up with be scheduled after testing.

## 2012-01-29 NOTE — Assessment & Plan Note (Signed)
She has had a slightly elevated BNP.  For now she will continue the meds as listed.

## 2012-01-30 ENCOUNTER — Telehealth: Payer: Self-pay | Admitting: *Deleted

## 2012-01-30 NOTE — Telephone Encounter (Signed)
Left message for pt to report to [facility] for TEE.  Requested she call back with any questions

## 2012-02-01 ENCOUNTER — Other Ambulatory Visit: Payer: Self-pay | Admitting: Cardiology

## 2012-02-01 DIAGNOSIS — I340 Nonrheumatic mitral (valve) insufficiency: Secondary | ICD-10-CM

## 2012-02-02 ENCOUNTER — Telehealth: Payer: Self-pay | Admitting: Cardiology

## 2012-02-02 NOTE — Telephone Encounter (Signed)
When called patient to discuss TEE she stated someone had told her to be at procedure at 12:00 instead of 11:00.  Did call and speak with Annice Pih at [phone] and she stated Dr Tenny Craw had a 12:00 and 2:00 TEE and wanted to know if she wanted to change to 1:00 so she could have back to back.  Discussed with Stormy Card RN with Dr Tenny Craw and she said to move patient to the 1:00 case since Dr Tenny Craw had office am.  Called and left message for patient to call back.  Jackie at scheduling stated she would let endo know.

## 2012-02-02 NOTE — Telephone Encounter (Signed)
Please return call to patient at [phone], regarding prep for TEE procedure.

## 2012-02-02 NOTE — Telephone Encounter (Signed)
Advised patient

## 2012-02-08 MED ORDER — SODIUM CHLORIDE 0.9 % IJ SOLN: 3.0000 mL | INTRAMUSCULAR | Status: DC | PRN

## 2012-02-08 MED ORDER — SODIUM CHLORIDE 0.45 % IV SOLN: INTRAVENOUS | Status: DC

## 2012-02-08 MED ORDER — SODIUM CHLORIDE 0.9 % IV SOLN
250.0000 mL | INTRAVENOUS | Status: DC | PRN
  Administered 2012-02-09: 500 mL via INTRAVENOUS

## 2012-02-08 MED ORDER — BENZOCAINE 20 % MT SOLN
1.0000 "application " | OROMUCOSAL | Status: DC | PRN
  Filled 2012-02-08: qty 57

## 2012-02-08 MED ORDER — FENTANYL CITRATE 0.05 MG/ML IJ SOLN: 250.0000 ug | Freq: Once | INTRAMUSCULAR | Status: DC

## 2012-02-08 MED ORDER — MIDAZOLAM HCL 10 MG/2ML IJ SOLN: 10.0000 mg | Freq: Once | INTRAMUSCULAR | Status: DC

## 2012-02-08 MED ORDER — SODIUM CHLORIDE 0.9 % IJ SOLN: 3.0000 mL | Freq: Two times a day (BID) | INTRAMUSCULAR | Status: DC

## 2012-02-09 ENCOUNTER — Ambulatory Visit (HOSPITAL_COMMUNITY)
Admission: RE | Admit: 2012-02-09 | Discharge: 2012-02-09 | Disposition: A | Discharge status: Home / Self Care | Payer: Medicare Other | Source: Ambulatory Visit | Attending: Internal Medicine | Admitting: Internal Medicine

## 2012-02-09 ENCOUNTER — Encounter (HOSPITAL_COMMUNITY)
Admission: RE | Disposition: A | Discharge status: Home / Self Care | Payer: Self-pay | Source: Ambulatory Visit | Attending: Internal Medicine

## 2012-02-09 ENCOUNTER — Encounter (HOSPITAL_COMMUNITY): Payer: Self-pay

## 2012-02-09 DIAGNOSIS — I059 Rheumatic mitral valve disease, unspecified: Secondary | ICD-10-CM | POA: Diagnosis not present

## 2012-02-09 DIAGNOSIS — I340 Nonrheumatic mitral (valve) insufficiency: Secondary | ICD-10-CM

## 2012-02-09 DIAGNOSIS — R0602 Shortness of breath: Secondary | ICD-10-CM

## 2012-02-09 DIAGNOSIS — I4891 Unspecified atrial fibrillation: Secondary | ICD-10-CM

## 2012-02-09 HISTORY — PX: TEE WITHOUT CARDIOVERSION: SHX5443

## 2012-02-09 SURGERY — ECHOCARDIOGRAM, TRANSESOPHAGEAL
Anesthesia: Moderate Sedation

## 2012-02-09 MED ORDER — MIDAZOLAM HCL 10 MG/2ML IJ SOLN
INTRAMUSCULAR | Status: DC | PRN
  Administered 2012-02-09: 1 mg via INTRAVENOUS

## 2012-02-09 MED ORDER — LIDOCAINE VISCOUS 2 % MT SOLN
OROMUCOSAL | Status: AC
  Filled 2012-02-09: qty 15

## 2012-02-09 MED ORDER — MIDAZOLAM HCL 10 MG/2ML IJ SOLN
INTRAMUSCULAR | Status: AC
  Filled 2012-02-09: qty 2

## 2012-02-09 MED ORDER — FENTANYL CITRATE 0.05 MG/ML IJ SOLN
INTRAMUSCULAR | Status: DC | PRN
  Administered 2012-02-09: 25 ug via INTRAVENOUS

## 2012-02-09 MED ORDER — FENTANYL CITRATE 0.05 MG/ML IJ SOLN
INTRAMUSCULAR | Status: AC
  Filled 2012-02-09: qty 2

## 2012-02-09 MED ORDER — LIDOCAINE VISCOUS 2 % MT SOLN
OROMUCOSAL | Status: DC | PRN
  Administered 2012-02-09: 20 mL via OROMUCOSAL

## 2012-02-09 NOTE — Op Note (Signed)
Full report to follow 

## 2012-02-09 NOTE — Progress Notes (Signed)
  Echocardiogram Echocardiogram Transesophageal has been performed.  Nancy Bush 02/09/2012, 2:20 PM

## 2012-02-09 NOTE — Interval H&P Note (Signed)
History and Physical Interval Note:  02/09/2012 1:43 PM  Nancy Bush  has presented today for surgery, with the diagnosis of mitral regurge  The various methods of treatment have been discussed with the patient and family. After consideration of risks, benefits and other options for treatment, the patient has consented to  Procedure(s) (LRB): TRANSESOPHAGEAL ECHOCARDIOGRAM (TEE) (N/A) as a surgical intervention .  The patient's history has been reviewed, patient examined, no change in status, stable for surgery.  I have reviewed the patients' chart and labs.  Questions were answered to the patient's satisfaction.     Dietrich Pates

## 2012-02-09 NOTE — H&P (View-Only) (Signed)
  HPI   The patient presents for followup of mitral regurgitation and atrial fibrillation.  Her biggest complaint has been profound fatigue. She actually saw Dr. Nishan for a second opinion.he ordered another transthoracic ultrasound which demonstrates that her ejection fraction is still well preserved but that she has moderately severe mitral regurgitation. He had no other significant suggestions were markedly different for her. However, she still dismayed because she's so she. She doesn't sleep well every night.  However, she doesn't necessarily correlate this with her fatigue.  She doesn't have severe shortness of breath, PND or orthopnea. She doesn't have any chest pressure, neck or arm discomfort. She will occasionally get some dyspnea.    Allergies  Allergen Reactions  . Promethazine Hcl   . Sulfonamide Derivatives   . Tequin     hallucinations     Current Outpatient Prescriptions  Medication Sig Dispense Refill  . Calcium Carbonate-Vitamin D (CALCIUM + D PO) Take by mouth. Taking 2 Tablet       . furosemide (LASIX) 40 MG tablet Take 1 tablet (40 mg total) by mouth daily.  30 tablet  11  . methotrexate (RHEUMATREX) 2.5 MG tablet Take 10 mg by mouth once a week. Caution:Chemotherapy. Protect from light.       . metoprolol succinate (TOPROL XL) 25 MG 24 hr tablet Take 1 tablet (25 mg total) by mouth 2 (two) times daily.  60 tablet  11  . potassium chloride SA (KLOR-CON M20) 20 MEQ tablet Take 1 tablet (20 mEq total) by mouth daily.  30 tablet  11  . predniSONE (DELTASONE) 5 MG tablet Take 5 mg by mouth. 1/2 Tablet Every Other Day      . warfarin (COUMADIN) 3 MG tablet Take 1 tablet (3 mg total) by mouth as directed.  30 tablet  3  . folic acid (FOLVITE) 1 MG tablet Take 1 mg by mouth 2 (two) times daily.       . DISCONTD: warfarin (COUMADIN) 3 MG tablet TAKE 1 TABLET BY MOUTH AS DIRECTED  35 tablet  3    Past Medical History  Diagnosis Date  . MVP (mitral valve prolapse)   .  Pulmonary hypertension     MILD TO MODERATE BY ECHO  . History of chest pain   . Mitral regurgitation     MILD  . Asthma   . Rheumatoid arthritis   . Shortness of breath   . H/O: hysterectomy   . LBBB (left bundle branch block)     per discharge note 2003  . PVC (premature ventricular contraction)   . Atrial fibrillation     Past Surgical History  Procedure Date  . Cholecystectomy   . Ovarian cyst removal   . Hysterectomy   . Appendectomy   . Cataract extraction     ROS:  As stated in the HPI and negative for all other systems.  PHYSICAL EXAM BP 100/60  Pulse 80  Ht 5' (1.524 m)  Wt 117 lb 12.8 oz (53.434 kg)  BMI 23.01 kg/m2 GENERAL:  Well appearing HEENT:  Pupils equal round and reactive, fundi not visualized, oral mucosa unremarkable NECK:  No jugular venous distention, waveform within normal limits, carotid upstroke brisk and symmetric, no bruits, no thyromegaly LYMPHATICS:  No cervical, inguinal adenopathy LUNGS:  Clear to auscultation bilaterally BACK:  No CVA tenderness CHEST:  Unremarkable HEART:  PMI not displaced or sustained,S1 and S2 within normal limits, no S3, no clicks, no rubs, holosystolic anterior murmur, irregular   ABD:  Flat, positive bowel sounds normal in frequency in pitch, no bruits, no rebound, no guarding, no midline pulsatile mass, no hepatomegaly, no splenomegaly EXT:  2 plus pulses throughout, no edema, no cyanosis no clubbing SKIN:  Flat red areas on bilateral lower legs. NEURO:  Cranial nerves II through XII grossly intact, motor grossly intact throughout PSYCH:  Cognitively intact, oriented to person place and time    ASSESSMENT AND PLAN  

## 2012-02-09 NOTE — Discharge Instructions (Signed)
Endoscopy Care After Please read the instructions outlined below and refer to this sheet in the next few weeks. These discharge instructions provide you with general information on caring for yourself after you leave the hospital. Your doctor may also give you specific instructions. While your treatment has been planned according to the most current medical practices available, unavoidable complications occasionally occur. If you have any problems or questions after discharge, please call your doctor. HOME CARE INSTRUCTIONS Activity  You may resume your regular activity but move at a slower pace for the next 24 hours.   Take frequent rest periods for the next 24 hours.   Walking will help expel (get rid of) the air and reduce the bloated feeling in your abdomen.   No driving for 24 hours (because of the anesthesia (medicine) used during the test).   You may shower.   Do not sign any important legal documents or operate any machinery for 24 hours (because of the anesthesia used during the test).  Nutrition  Drink plenty of fluids.   You may resume your normal diet.   Begin with a light meal and progress to your normal diet.   Avoid alcoholic beverages for 24 hours or as instructed by your caregiver.  Medications You may resume your normal medications unless your caregiver tells you otherwise. What you can expect today  You may experience abdominal discomfort such as a feeling of fullness or "gas" pains.   You may experience a sore throat for 2 to 3 days. This is normal. Gargling with salt water may help this.  Follow-up Your doctor will discuss the results of your test with you. SEEK IMMEDIATE MEDICAL CARE IF:  You have excessive nausea (feeling sick to your stomach) and/or vomiting.   You have severe abdominal pain and distention (swelling).   You have trouble swallowing.   You have a temperature over 100 F (37.8 C).   You have rectal bleeding or vomiting of blood.    Document Released: 03/04/2004 Document Revised: 07/10/2011 Document Reviewed: 09/15/2007 [employer] Patient Information 2012 [employer]. 

## 2012-02-11 ENCOUNTER — Encounter (HOSPITAL_COMMUNITY): Payer: Self-pay | Admitting: Internal Medicine

## 2012-02-12 ENCOUNTER — Telehealth: Payer: Self-pay | Admitting: Cardiology

## 2012-02-12 NOTE — Telephone Encounter (Signed)
Please return call to patient at [phone] regarding test results

## 2012-02-13 NOTE — Telephone Encounter (Signed)
Left message for pt to call back for results of her 2 D Echo

## 2012-02-16 NOTE — Telephone Encounter (Signed)
Do not see where this has been reviewed by Dr Antoine Poche. Will forward to Orlie Dakin. RN

## 2012-02-16 NOTE — Telephone Encounter (Signed)
F/U   Patient returning call back to nurse. 

## 2012-02-18 ENCOUNTER — Telehealth: Payer: Self-pay | Admitting: Cardiology

## 2012-02-18 NOTE — Telephone Encounter (Signed)
New msg Pt wants to know test results please call

## 2012-02-18 NOTE — Telephone Encounter (Signed)
Please see last phone note.

## 2012-02-19 NOTE — Telephone Encounter (Signed)
Per Dr Antoine Poche  TEE demonstrated moderate MRI - he would like to see her in the office to discuss.  appt scheduled for 8/22

## 2012-02-19 NOTE — Telephone Encounter (Signed)
Fu call  Pt calling about echo results qagain

## 2012-02-25 ENCOUNTER — Ambulatory Visit
Admission: RE | Admit: 2012-02-25 | Discharge: 2012-02-25 | Disposition: A | Discharge status: Home / Self Care | Payer: Medicare Other | Source: Ambulatory Visit | Attending: Family Medicine | Admitting: Family Medicine

## 2012-02-25 ENCOUNTER — Ambulatory Visit (INDEPENDENT_AMBULATORY_CARE_PROVIDER_SITE_OTHER): Payer: Medicare Other | Admitting: *Deleted

## 2012-02-25 DIAGNOSIS — I4891 Unspecified atrial fibrillation: Secondary | ICD-10-CM

## 2012-02-25 DIAGNOSIS — Z1231 Encounter for screening mammogram for malignant neoplasm of breast: Secondary | ICD-10-CM | POA: Diagnosis not present

## 2012-02-25 DIAGNOSIS — Z7901 Long term (current) use of anticoagulants: Secondary | ICD-10-CM

## 2012-03-10 DIAGNOSIS — H2510 Age-related nuclear cataract, unspecified eye: Secondary | ICD-10-CM | POA: Diagnosis not present

## 2012-03-10 DIAGNOSIS — D3130 Benign neoplasm of unspecified choroid: Secondary | ICD-10-CM | POA: Diagnosis not present

## 2012-03-10 DIAGNOSIS — Z961 Presence of intraocular lens: Secondary | ICD-10-CM | POA: Diagnosis not present

## 2012-03-10 DIAGNOSIS — H35379 Puckering of macula, unspecified eye: Secondary | ICD-10-CM | POA: Diagnosis not present

## 2012-03-25 ENCOUNTER — Ambulatory Visit (INDEPENDENT_AMBULATORY_CARE_PROVIDER_SITE_OTHER): Payer: Medicare Other | Admitting: Cardiology

## 2012-03-25 ENCOUNTER — Encounter: Payer: Self-pay | Admitting: Cardiology

## 2012-03-25 ENCOUNTER — Ambulatory Visit (INDEPENDENT_AMBULATORY_CARE_PROVIDER_SITE_OTHER): Payer: Medicare Other | Admitting: Pharmacist

## 2012-03-25 VITALS — BP 109/68 | HR 92 | Ht 60.0 in | Wt 114.6 lb

## 2012-03-25 DIAGNOSIS — I4891 Unspecified atrial fibrillation: Secondary | ICD-10-CM | POA: Diagnosis not present

## 2012-03-25 DIAGNOSIS — Z7901 Long term (current) use of anticoagulants: Secondary | ICD-10-CM | POA: Diagnosis not present

## 2012-03-25 LAB — POCT INR: INR: 2.2

## 2012-03-25 NOTE — Progress Notes (Signed)
HPI   The patient presents for followup of mitral regurgitation and atrial fibrillation.  Since I last saw her she has had no new problems. I did send her for a transesophageal ultrasound which confirms mitral prolapse with moderately severe mitral regurgitation is moderate.   There prolapse of the posterior leaflet with an anteriorly directed jet and left atrial enlargement.  Since that procedure she's had no new symptoms. She denies any new shortness of breath, PND or orthopnea. She has no new sense of palpitations and has had no presyncope or syncope. She tolerates her anticoagulation.   Her biggest complaint continues to be profound fatigue that does reduce her desire or ability to perform her household chores.   Of note she did fall several weeks ago leading over to pick something up. However, she's had no residual neurologic deficit without headache, ataxia, blurred vision or other problems.   Allergies  Allergen Reactions  . Promethazine Hcl   . Sulfonamide Derivatives   . Tequin     hallucinations     Current Outpatient Prescriptions  Medication Sig Dispense Refill  . Calcium Carbonate-Vitamin D (CALCIUM + D PO) Take by mouth. Taking 2 Tablet       . folic acid (FOLVITE) 1 MG tablet Take 1 mg by mouth daily. Take 2 tab a day      . furosemide (LASIX) 40 MG tablet Take 1 tablet (40 mg total) by mouth daily.  30 tablet  11  . methotrexate (RHEUMATREX) 2.5 MG tablet Take 10 mg by mouth once a week. Caution:Chemotherapy. Protect from light.       . metoprolol succinate (TOPROL XL) 25 MG 24 hr tablet Take 1 tablet (25 mg total) by mouth 2 (two) times daily.  60 tablet  11  . potassium chloride SA (KLOR-CON M20) 20 MEQ tablet Take 1 tablet (20 mEq total) by mouth daily.  30 tablet  11  . predniSONE (DELTASONE) 5 MG tablet Take 5 mg by mouth. 1/2 Tablet Every Other Day      . warfarin (COUMADIN) 3 MG tablet Take 1 tablet (3 mg total) by mouth as directed.  30 tablet  3    Past Medical  History  Diagnosis Date  . MVP (mitral valve prolapse)   . Pulmonary hypertension     MILD TO MODERATE BY ECHO  . History of chest pain   . Mitral regurgitation     MILD  . Asthma   . Rheumatoid arthritis   . Shortness of breath   . H/O: hysterectomy   . LBBB (left bundle branch block)     per discharge note 2003  . PVC (premature ventricular contraction)   . Atrial fibrillation     Past Surgical History  Procedure Date  . Cholecystectomy   . Ovarian cyst removal   . Hysterectomy   . Appendectomy   . Cataract extraction   . Tee without cardioversion 02/09/2012    Procedure: TRANSESOPHAGEAL ECHOCARDIOGRAM (TEE);  Surgeon: Pricilla Riffle, MD;  Location: [facility] ENDOSCOPY;  Service: Cardiovascular;  Laterality: N/A;    ROS:  As stated in the HPI and negative for all other systems.  PHYSICAL EXAM BP 109/68  Pulse 92  Ht 5' (1.524 m)  Wt 114 lb 9.6 oz (51.982 kg)  BMI 22.38 kg/m2 GENERAL:  Well appearing HEENT:  Pupils equal round and reactive, fundi not visualized, oral mucosa unremarkable NECK:  No jugular venous distention, waveform within normal limits, carotid upstroke brisk and symmetric, no  bruits, no thyromegaly LYMPHATICS:  No cervical, inguinal adenopathy LUNGS:  Clear to auscultation bilaterally BACK:  No CVA tenderness CHEST:  Unremarkable HEART:  PMI not displaced or sustained,S1 and S2 within normal limits, no S3, no clicks, no rubs, holosystolic anterior murmur, irregular ABD:  Flat, positive bowel sounds normal in frequency in pitch, no bruits, no rebound, no guarding, no midline pulsatile mass, no hepatomegaly, no splenomegaly EXT:  2 plus pulses throughout, no edema, no cyanosis no clubbing SKIN:  Flat red areas on bilateral lower legs. NEURO:  Cranial nerves II through XII grossly intact, motor grossly intact throughout PSYCH:  Cognitively intact, oriented to person place and time   EKG:   atrial fibrillation, rate 92, left anterior fascicular block, left axis  deviation, poor anterior R wave progression.  03/25/2012  ASSESSMENT AND PLAN  Fatigue -  This is her predominant complaint. The etiology of this is not entirely clear. It could not be convinced that this is related to her arrhythmia or regurgitation. In the past her thyroid has been normal and she's not been anemic. At this point no change in therapy or further evaluation is warranted.   Atrial fibrillation -  She reasonable rate control. However, she clearly had fibrillation and flutter in the past. She will continue with rate control and anticoagulation.  Mitral Regurgitation -  Her regurgitation is moderately severe to severe. Her EF was well-preserved. She would consider surgical treatment of this. However, at this time I cannot at all guaranteed that her fatigue is related to the mitral regurgitation or the atrial fibrillation or that she would feel remarkably better if she had surgical repair of this. With her age there is a possibility that she would have a lesser quality-of-life postoperatively. We did discuss the progressive and degenerative nature of this and the fact that he is likely to cause clear shortness of breath at some point in the future and when that happens she might be too old for surgical repair. We discussed this at great length (greater than 30 minutes with his appointment) and I offered her a surgical referral for consultation. She will think about this but his point ops for continued medical management.   Acute on chronic diastolic heart failure -  She has had a slightly elevated BNP but it has been stable on follow up. For now she will continue the meds as listed.

## 2012-03-25 NOTE — Patient Instructions (Addendum)
The current medical regimen is effective;  continue present plan and medications.  Follow up in 3 months with Dr Hochrein. 

## 2012-04-19 ENCOUNTER — Telehealth: Payer: Self-pay | Admitting: Cardiology

## 2012-04-19 NOTE — Telephone Encounter (Signed)
New problem:  No information was given. Set up an appt to see the surgeon.

## 2012-04-19 NOTE — Telephone Encounter (Signed)
Patient wants to proceed with an appointment with surgeon, will fax info to get appointment

## 2012-04-22 ENCOUNTER — Ambulatory Visit (INDEPENDENT_AMBULATORY_CARE_PROVIDER_SITE_OTHER): Payer: Medicare Other | Admitting: *Deleted

## 2012-04-22 DIAGNOSIS — M76899 Other specified enthesopathies of unspecified lower limb, excluding foot: Secondary | ICD-10-CM | POA: Diagnosis not present

## 2012-04-22 DIAGNOSIS — I4891 Unspecified atrial fibrillation: Secondary | ICD-10-CM | POA: Diagnosis not present

## 2012-04-22 DIAGNOSIS — M159 Polyosteoarthritis, unspecified: Secondary | ICD-10-CM | POA: Diagnosis not present

## 2012-04-22 DIAGNOSIS — M069 Rheumatoid arthritis, unspecified: Secondary | ICD-10-CM | POA: Diagnosis not present

## 2012-04-22 DIAGNOSIS — Z7901 Long term (current) use of anticoagulants: Secondary | ICD-10-CM

## 2012-04-23 ENCOUNTER — Institutional Professional Consult (permissible substitution) (INDEPENDENT_AMBULATORY_CARE_PROVIDER_SITE_OTHER): Payer: Medicare Other | Admitting: Thoracic Surgery (Cardiothoracic Vascular Surgery)

## 2012-04-23 ENCOUNTER — Encounter: Payer: Self-pay | Admitting: Thoracic Surgery (Cardiothoracic Vascular Surgery)

## 2012-04-23 VITALS — BP 122/75 | HR 77 | Resp 16 | Ht 60.0 in | Wt 115.0 lb

## 2012-04-23 DIAGNOSIS — I340 Nonrheumatic mitral (valve) insufficiency: Secondary | ICD-10-CM

## 2012-04-23 DIAGNOSIS — I4891 Unspecified atrial fibrillation: Secondary | ICD-10-CM | POA: Diagnosis not present

## 2012-04-23 DIAGNOSIS — I059 Rheumatic mitral valve disease, unspecified: Secondary | ICD-10-CM | POA: Diagnosis not present

## 2012-04-23 DIAGNOSIS — I080 Rheumatic disorders of both mitral and aortic valves: Secondary | ICD-10-CM

## 2012-04-23 NOTE — Progress Notes (Signed)
[address]            [place] [address]          [phone]     CARDIOTHORACIC SURGERY CONSULTATION REPORT  Referring Provider is Nancy Rotunda, MD PCP is Nancy Floro, MD  Chief Complaint  Patient presents with  . Mitral Regurgitation    eval and treat...has had TEE  . Atrial Fibrillation    HPI:  Patient is an 78 year old retired single female from [place] with known history of mitral valve prolapse with mitral regurgitation, pulmonary hypertension, persistent atrial fibrillation and congestive heart failure who has been referred to consider elective surgical intervention. The patient states that she was noted to have a heart murmur on routine physical exam many years ago.  She was diagnosed with mitral valve prolapse with mitral regurgitation, pulmonary hypertention, and tricuspid regurgitation by serial echocardiograms and has been followed for the past several years by Dr. Antoine Bush and Dr. Eden Bush. In October 2012 she presented with new onset persistent atrial fibrillation with acute exacerbation of congestive heart failure. She has been anticoagulated on Coumadin ever since.  She underwent transesophageal echocardiogram 02/09/2012 confirming the presence of mitral valve prolapse with moderate to severe mitral regurgitation and preserved left ventricular systolic function. She has now been referred for elective surgical consultation.  The patient reports having intermittent episodes of exertional shortness of breath off and on for the past year or 2. Over the past year her primary complaint has been that of progressive fatigue. She has had some atypical chest pains in the past but for the most part she has not had any significant chest pain either with activity or at rest. She denies any resting shortness of breath, PND, orthopnea, or syncope. She has had some bilateral lower extremity edema which was most part is reasonably well-controlled  on low-dose diuretic therapy.  Past Medical History  Diagnosis Date  . MVP (mitral valve prolapse)   . Pulmonary hypertension     MILD TO MODERATE BY ECHO  . History of chest pain   . Mitral regurgitation     MILD  . Asthma   . Rheumatoid arthritis   . Shortness of breath   . H/O: hysterectomy   . LBBB (left bundle branch block)     per discharge note 2003  . PVC (premature ventricular contraction)   . Atrial fibrillation     Past Surgical History  Procedure Date  . Cholecystectomy   . Ovarian cyst removal   . Hysterectomy   . Appendectomy   . Cataract extraction   . Tee without cardioversion 02/09/2012    Procedure: TRANSESOPHAGEAL ECHOCARDIOGRAM (TEE);  Surgeon: Nancy Riffle, MD;  Location: [facility] ENDOSCOPY;  Service: Cardiovascular;  Laterality: N/A;    No family history on file.  Social History History  Substance Use Topics  . Smoking status: Never Smoker   . Smokeless tobacco: Not on file  . Alcohol Use: No    Current Outpatient Prescriptions  Medication Sig Dispense Refill  . Calcium Carbonate-Vitamin D (CALCIUM + D PO) Take by mouth. Taking 2 Tablet       . folic acid (FOLVITE) 1 MG tablet Take 1 mg by mouth daily. Take 2 tab a day      . furosemide (LASIX) 40 MG tablet Take 1 tablet (40 mg total) by mouth daily.  30 tablet  11  .  methotrexate (RHEUMATREX) 2.5 MG tablet Take 10 mg by mouth once a week. Caution:Chemotherapy. Protect from light.       . metoprolol succinate (TOPROL XL) 25 MG 24 hr tablet Take 1 tablet (25 mg total) by mouth 2 (two) times daily.  60 tablet  11  . potassium chloride SA (KLOR-CON M20) 20 MEQ tablet Take 1 tablet (20 mEq total) by mouth daily.  30 tablet  11  . predniSONE (DELTASONE) 5 MG tablet Take 5 mg by mouth. 1/2 Tablet Every Other Day      . warfarin (COUMADIN) 3 MG tablet Take 1 tablet (3 mg total) by mouth as directed.  30 tablet  3    Allergies  Allergen Reactions  . Promethazine Hcl   . Sulfonamide Derivatives   . Tequin      hallucinations       Review of Systems:   General:  normal appetite, decreased energy, no weight gain, no weight loss, no fever  Cardiac:  no chest pain with exertion, no chest pain at rest, +SOB with mild-moderate exertion, no resting SOB, no PND, no orthopnea, occasional palpitations, + arrhythmia, + atrial fibrillation, + LE edema, NO dizzy spells, no syncope  Respiratory:  + shortness of breath, no home oxygen, no productive cough, no dry cough, no bronchitis, no wheezing, no hemoptysis, + asthma in the past now well controlled since taking Prednisone, no pain with inspiration or cough, no sleep apnea, no CPAP at night  GI:   no difficulty swallowing, no reflux, no frequent heartburn, no hiatal hernia, no abdominal pain, no constipation, no diarrhea, no hematochezia, no hematemesis, no melena  GU:   no dysuria,  no frequency, occasional urinary tract infection, no hematuria, no kidney stones, + mild chronic kidney disease  Vascular:  no pain suggestive of claudication, mild pain in feet, no leg cramps, no varicose veins, no DVT, no non-healing foot ulcer  Neuro:   no stroke, no TIA's, no seizures, no headaches, notemporary blindness one eye,  no slurred speech, no peripheral neuropathy, no chronic pain, no instability of gait, mild memory/cognitive dysfunction  Musculoskeletal: + arthritis, + joint swelling, no myalgias, no difficulty walking, no mobility   Skin:   no rash, no itching, no skin infections, no pressure sores or ulcerations  Psych:   no anxiety, no depression, no nervousness, no unusual recent stress  Eyes:   no blurry vision, no floaters, no recent vision changes, + wears glasses or contacts  ENT:   no hearing loss, no loose or painful teeth, no dentures, last saw dentist 6 months ago  Hematologic:  + easy bruising, no abnormal bleeding, no clotting disorder, no frequent epistaxis, no complications on coumadin therapy  Endocrine:  no diabetes     Physical Exam:   BP  122/75  Pulse 77  Resp 16  Ht 5' (1.524 m)  Wt 115 lb (52.164 kg)  BMI 22.46 kg/m2  SpO2 99%  General:  Elderly and somewhat frail but otherwise well-appearing  HEENT:  Unremarkable   Neck:   no JVD, no bruits, no adenopathy   Chest:   clear to auscultation, symmetrical breath sounds, no wheezes, no rhonchi   CV:   RRR, grade IV/VI holosystolic murmur   Abdomen:  soft, non-tender, no masses   Extremities:  warm, well-perfused, pulses diminished, mild bilateral LE edema  Rectal/GU  Deferred  Neuro:   Grossly non-focal and symmetrical throughout  Skin:   Clean and dry, no rashes, no breakdown but thin  and frail   Diagnostic Tests:  Transesophageal Echocardiography  Patient: Nancy Bush, Nancy Bush MR #: [id number] Study Date: 02/09/2012 Gender: F Age: 63 Height: 152.4cm Weight: 53.2kg BSA: 1.14m^2 Pt. Status: Room: [facility]  ADMITTING Dietrich Pates ATTENDING Dietrich Pates ORDERING [place], Gunnar Fusi PERFORMING Dietrich Pates REFERRING [place], [place] SONOGRAPHER Emelia Loron, RDCS cc:  ------------------------------------------------------------  ------------------------------------------------------------ Indications: Mitral regurgitation 424.0.  ------------------------------------------------------------ Study Conclusions  Mitral valve: MV is mildly thickened. There is moderate prolapse of the posterior mitral leaflet with MR directed anteriorly through the LA The MR is 2-3/4 to 3/4 in severity. Transesophageal echocardiography. 2D and color Doppler. Height: Height: 152.4cm. Height: 60in. Weight: Weight: 53.2kg. Weight: 117lb. Body mass index: BMI: 22.9kg/m^2. Body surface area: BSA: 1.54m^2. Patient status: Outpatient. Location: Endoscopy.  ------------------------------------------------------------  ------------------------------------------------------------ Left ventricle: LVEF is normal.  ------------------------------------------------------------ Aortic valve: AV is  mildly thickened. No AI.  ------------------------------------------------------------ Aorta: Mild fixed plaque in the thoracic aorta.  ------------------------------------------------------------ Mitral valve: MV is mildly thickened. There is moderate prolapse of the posterior mitral leaflet with MR directed anteriorly through the LA The MR is 2-3/4 to 3/4 in severity.  ------------------------------------------------------------ Left atrium: LA is enlarged. No evidence of thrombus in the atrial cavity or appendage.  ------------------------------------------------------------ Atrial septum: No ASD/PFO by color doppler or with injection of agitated saline.  ------------------------------------------------------------ Pulmonic valve: PV is normal. Trace PI.  ------------------------------------------------------------ Tricuspid valve: TV is normal. Mild TR.  ------------------------------------------------------------ Post procedure conclusions Ascending Aorta:  - Mild fixed plaque in the thoracic aorta.  ------------------------------------------------------------ Prepared and Electronically Authenticated by  Dietrich Pates 2013-07-08T17:07:22.990   Images from the above referenced transesophageal echocardiogram are directly reviewed. The archives images are somewhat suboptimal, but there is clearly prolapse involving what appears to be the middle scallop of the posterior leaflet of the mitral valve with severe mitral regurgitation. This segment of the valve is likely flail in the jet of regurgitation courses anteriorly around the left atrium. There is left atrial enlargement. There is normal left ventricular size and systolic function. The aortic valve appears normal. Right ventricular size and function appears normal. There is mild to moderate tricuspid regurgitation. Tricuspid regurgitation is somewhat eccentrically directed.   Impression:  Mitral valve prolapse with  likely flail segment of the posterior leaflet of the mitral valve and severe symptomatic mitral regurgitation with persistent atrial fibrillation and preserved left ventricular systolic function. Risks associated with surgical intervention will be somewhat elevated because of the patient's advanced age and long-standing history of steroid-dependent rheumatoid arthritis. She is single and lives alone and appears somewhat frail physically, but she remains completely functionally independent.   Plan:  The rationale for elective mitral valve repair surgery has been explained, including a comparison between surgery and continued medical therapy with close follow-up.  The likelihood of successful and durable valve repair has been discussed with particular reference to the findings of their recent echocardiogram.  Based upon these findings and previous experience, I have quoted them a greater than 80 percent likelihood of successful valve repair.  The patient would need diagnostic catheterization be performed, but in the absence of significant coronary artery disease she might be candidate for minimally invasive approach for surgery. We discussed the length expectations regarding her postoperative convalescence including the likelihood that she would need at least temporary placement in a rehabilitation facility or make arrangements with her family for care at home. All of her questions been addressed. She is interested in proceeding with surgery but wants to think matters over further before making a final  decision. She will need to have left and right heart catheterization to be performed, and we would favor also imaging heard descending thoracic and abdominal aorta at the time of catheterization to rule out the possibility of significant aortoiliac disease which might preclude femoral cannulation for surgery. This could potentially be performed 2 or 3 days prior to surgery while she is off Coumadin therapy, but  because of her age and chronic kidney disease she would require at least 48 hours in between catheterization and surgery to make certain that her renal function returns to baseline. I've offered to see her back in the office at any point to discuss matters further with the make arrangements for catheterization either as an outpatient or as an inpatient prior to tentatively scheduled surgery.  She will contact our office next week after discussing matters with her family.     Salvatore Decent. Cornelius Moras, MD 04/23/2012 5:12 PM

## 2012-04-27 ENCOUNTER — Encounter: Payer: Medicare Other | Admitting: Thoracic Surgery (Cardiothoracic Vascular Surgery)

## 2012-05-03 ENCOUNTER — Ambulatory Visit (INDEPENDENT_AMBULATORY_CARE_PROVIDER_SITE_OTHER): Payer: Medicare Other | Admitting: Thoracic Surgery (Cardiothoracic Vascular Surgery)

## 2012-05-03 ENCOUNTER — Encounter: Payer: Self-pay | Admitting: Thoracic Surgery (Cardiothoracic Vascular Surgery)

## 2012-05-03 VITALS — BP 97/65 | HR 87 | Resp 16 | Ht 60.0 in | Wt 115.0 lb

## 2012-05-03 DIAGNOSIS — I059 Rheumatic mitral valve disease, unspecified: Secondary | ICD-10-CM

## 2012-05-03 DIAGNOSIS — I080 Rheumatic disorders of both mitral and aortic valves: Secondary | ICD-10-CM

## 2012-05-03 DIAGNOSIS — I4891 Unspecified atrial fibrillation: Secondary | ICD-10-CM

## 2012-05-03 DIAGNOSIS — I340 Nonrheumatic mitral (valve) insufficiency: Secondary | ICD-10-CM | POA: Insufficient documentation

## 2012-05-03 MED ORDER — AMIODARONE HCL 200 MG PO TABS: 200.0000 mg | ORAL_TABLET | Freq: Two times a day (BID) | ORAL | Status: DC

## 2012-05-03 NOTE — Progress Notes (Addendum)
[address]            [name] [address]          [phone]     CARDIOTHORACIC SURGERY OFFICE NOTE  Referring Provider is Rollene Rotunda, MD PCP is [name], MD   HPI:  Patient returns for followup of mitral valve prolapse with severe mitral regurgitation and persistent atrial fibrillation.  She was originally seen in consultation on September 20. Since then the patient has discussed matters at length with her family and she is now interested in proceeding with diagnostic cardiac catheterization and elective mitral valve repair with Maze procedure. She reports no new problems since her last office visit.   Current Outpatient Prescriptions  Medication Sig Dispense Refill  . Calcium Carbonate-Vitamin D (CALCIUM + D PO) Take by mouth. Taking 2 Tablet       . folic acid (FOLVITE) 1 MG tablet Take 1 mg by mouth daily. Take 2 tab a day      . furosemide (LASIX) 40 MG tablet Take 1 tablet (40 mg total) by mouth daily.  30 tablet  11  . methotrexate (RHEUMATREX) 2.5 MG tablet Take 10 mg by mouth once a week. Caution:Chemotherapy. Protect from light.       . metoprolol succinate (TOPROL XL) 25 MG 24 hr tablet Take 1 tablet (25 mg total) by mouth 2 (two) times daily.  60 tablet  11  . potassium chloride SA (KLOR-CON M20) 20 MEQ tablet Take 1 tablet (20 mEq total) by mouth daily.  30 tablet  11  . predniSONE (DELTASONE) 5 MG tablet Take 5 mg by mouth. 1/2 Tablet Every Other Day      . warfarin (COUMADIN) 3 MG tablet Take 1 tablet (3 mg total) by mouth as directed.  30 tablet  3      Physical Exam:   BP 97/65  Pulse 87  Resp 16  Ht 5' (1.524 m)  Wt 115 lb (52.164 kg)  BMI 22.46 kg/m2  SpO2 98%  General:  Well-appearing but frail  Chest:   Clear to auscultation  CV:   Irregular rhythm with systolic murmur  Incisions:  n/a  Abdomen:  Soft and nontender  Extremities:  Warm and well-perfused  Diagnostic  Tests:  n/a   Impression:  Mitral valve prolapse with likely flail segment of the posterior leaflet of the mitral valve and severe symptomatic mitral regurgitation with persistent atrial fibrillation and preserved left ventricular systolic function. Risks associated with surgical intervention will be somewhat elevated because of the patient's advanced age and long-standing history of steroid-dependent rheumatoid arthritis. She is single and lives alone and appears somewhat frail physically, but she remains completely functionally independent.   Plan:  I've discussed matters at length with the patient again in the office this afternoon. She is eager to proceed with catheterization and surgery in the near future. We tentatively plan for surgery on Tuesday, October 15. I discussed her case over the telephone with Dr. Antoine Poche we'll plan to proceed with catheterization one week earlier in order to give her at least a few days to recover from her catheterization and to allow her renal function to return to baseline. Because she remains and persistent atrial fibrillation and has relatively few other risk factors, we feel that she will not need to have bridging therapy with anticoagulation between the time of her catheterization and surgery.  I've instructed the patient not to resume taking either coumadin or methotrexate after her catheterization.  I will give her a prescription for amiodarone to begin one week prior to surgery while she is off of Coumadin. All of her questions been addressed. We'll plan to see her back here in the office on October 14 to review the results of her heart catheterization and make final plans for surgery.   Salvatore Decent. Cornelius Moras, MD 05/03/2012 4:35 PM

## 2012-05-03 NOTE — Patient Instructions (Addendum)
Begin amiodarone 200 mg by mouth twice daily one week prior to surgery (begin on 05/11/2012).   Do not resume taking methotrexate nor coumadin after your heart catheterization. Continue prednisone, folic acid, metoprolol, lasix and potassium until your surgery.

## 2012-05-04 ENCOUNTER — Other Ambulatory Visit: Payer: Self-pay

## 2012-05-04 ENCOUNTER — Other Ambulatory Visit: Payer: Self-pay | Admitting: Thoracic Surgery (Cardiothoracic Vascular Surgery)

## 2012-05-04 DIAGNOSIS — I059 Rheumatic mitral valve disease, unspecified: Secondary | ICD-10-CM

## 2012-05-04 DIAGNOSIS — I4891 Unspecified atrial fibrillation: Secondary | ICD-10-CM

## 2012-05-05 ENCOUNTER — Telehealth: Payer: Self-pay | Admitting: Cardiology

## 2012-05-05 DIAGNOSIS — Z79899 Other long term (current) drug therapy: Secondary | ICD-10-CM

## 2012-05-05 DIAGNOSIS — Z23 Encounter for immunization: Secondary | ICD-10-CM | POA: Diagnosis not present

## 2012-05-05 DIAGNOSIS — I25810 Atherosclerosis of coronary artery bypass graft(s) without angina pectoris: Secondary | ICD-10-CM

## 2012-05-05 DIAGNOSIS — Z01810 Encounter for preprocedural cardiovascular examination: Secondary | ICD-10-CM

## 2012-05-05 NOTE — Telephone Encounter (Signed)
Per pt - she is supposed to have a cardiac cath appr one week prior to her surgery which is tentatively scheduled for 05/18/2012.  Pt aware I need to speak to Dr Antoine Poche about this and when she would need to hold her coumadin prior to the procedure.  She is aware I will call back with this information as soon as possible.

## 2012-05-05 NOTE — Telephone Encounter (Signed)
New problem:  Status of upcoming heart cath procedure.

## 2012-05-06 ENCOUNTER — Encounter: Payer: Self-pay | Admitting: *Deleted

## 2012-05-06 ENCOUNTER — Telehealth: Payer: Self-pay | Admitting: Cardiology

## 2012-05-06 NOTE — Telephone Encounter (Signed)
Pt aware that she is scheduled for R and L heart cath on Tuesday 10/8 at 10:30 am.  She is to report at 9:30 am.  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  She knows not to take Coumadin after tonight.  Left message for [facility] of schedule time.

## 2012-05-06 NOTE — Telephone Encounter (Signed)
Done

## 2012-05-06 NOTE — Telephone Encounter (Signed)
F/u  Per Dawn, calling back to check on the status of cardiac cath. Surgery with Dr. Barry Dienes is 10/15.

## 2012-05-07 ENCOUNTER — Other Ambulatory Visit (INDEPENDENT_AMBULATORY_CARE_PROVIDER_SITE_OTHER): Payer: Medicare Other

## 2012-05-07 ENCOUNTER — Other Ambulatory Visit: Payer: Self-pay | Admitting: Physician Assistant

## 2012-05-07 DIAGNOSIS — Z79899 Other long term (current) drug therapy: Secondary | ICD-10-CM

## 2012-05-07 DIAGNOSIS — Z01810 Encounter for preprocedural cardiovascular examination: Secondary | ICD-10-CM

## 2012-05-07 DIAGNOSIS — I25810 Atherosclerosis of coronary artery bypass graft(s) without angina pectoris: Secondary | ICD-10-CM | POA: Diagnosis not present

## 2012-05-07 LAB — BASIC METABOLIC PANEL
CO2: 29 mEq/L (ref 19–32)
Chloride: 103 mEq/L (ref 96–112)
Potassium: 4.1 mEq/L (ref 3.5–5.1)
Sodium: 137 mEq/L (ref 135–145)

## 2012-05-07 LAB — CBC
Platelets: 133 10*3/uL — ABNORMAL LOW (ref 150.0–400.0)
RBC: 3.89 Mil/uL (ref 3.87–5.11)
WBC: 6.9 10*3/uL (ref 4.5–10.5)

## 2012-05-07 LAB — PROTIME-INR: INR: 2.1 ratio — ABNORMAL HIGH (ref 0.8–1.0)

## 2012-05-10 ENCOUNTER — Telehealth: Payer: Self-pay | Admitting: *Deleted

## 2012-05-10 ENCOUNTER — Encounter: Payer: Self-pay | Admitting: Cardiovascular Disease

## 2012-05-10 ENCOUNTER — Encounter (HOSPITAL_COMMUNITY): Payer: Self-pay | Admitting: Pharmacy Technician

## 2012-05-10 ENCOUNTER — Telehealth: Payer: Self-pay | Admitting: Cardiovascular Disease

## 2012-05-10 NOTE — Telephone Encounter (Signed)
Nancy Bush called the end of last week to let Dr. Cornelius Moras know that after reading all the side effects of Amiodarone that she is very scared and uneasy to take this med as directed preoperatively by him.  I told her she did not have to and that I would let him know of her decision.  She understands.

## 2012-05-11 ENCOUNTER — Ambulatory Visit (INDEPENDENT_AMBULATORY_CARE_PROVIDER_SITE_OTHER): Payer: Medicare Other | Admitting: *Deleted

## 2012-05-11 ENCOUNTER — Other Ambulatory Visit: Payer: Self-pay | Admitting: Cardiology

## 2012-05-11 ENCOUNTER — Inpatient Hospital Stay (HOSPITAL_BASED_OUTPATIENT_CLINIC_OR_DEPARTMENT_OTHER)
Admission: RE | Admit: 2012-05-11 | Discharge: 2012-05-11 | Disposition: A | Discharge status: Home / Self Care | Payer: Medicare Other | Source: Ambulatory Visit | Attending: Cardiovascular Disease | Admitting: Cardiovascular Disease

## 2012-05-11 ENCOUNTER — Encounter (HOSPITAL_BASED_OUTPATIENT_CLINIC_OR_DEPARTMENT_OTHER)
Admission: RE | Disposition: A | Discharge status: Home / Self Care | Payer: Self-pay | Source: Ambulatory Visit | Attending: Cardiovascular Disease

## 2012-05-11 DIAGNOSIS — I4891 Unspecified atrial fibrillation: Secondary | ICD-10-CM

## 2012-05-11 DIAGNOSIS — Z7901 Long term (current) use of anticoagulants: Secondary | ICD-10-CM | POA: Diagnosis not present

## 2012-05-11 DIAGNOSIS — I469 Cardiac arrest, cause unspecified: Secondary | ICD-10-CM | POA: Diagnosis not present

## 2012-05-11 DIAGNOSIS — I2510 Atherosclerotic heart disease of native coronary artery without angina pectoris: Secondary | ICD-10-CM | POA: Diagnosis not present

## 2012-05-11 DIAGNOSIS — I340 Nonrheumatic mitral (valve) insufficiency: Secondary | ICD-10-CM

## 2012-05-11 DIAGNOSIS — I519 Heart disease, unspecified: Secondary | ICD-10-CM | POA: Insufficient documentation

## 2012-05-11 DIAGNOSIS — Y840 Cardiac catheterization as the cause of abnormal reaction of the patient, or of later complication, without mention of misadventure at the time of the procedure: Secondary | ICD-10-CM | POA: Insufficient documentation

## 2012-05-11 DIAGNOSIS — I739 Peripheral vascular disease, unspecified: Secondary | ICD-10-CM | POA: Diagnosis not present

## 2012-05-11 DIAGNOSIS — Y92410 Unspecified street and highway as the place of occurrence of the external cause: Secondary | ICD-10-CM | POA: Insufficient documentation

## 2012-05-11 DIAGNOSIS — Y9210 Unspecified residential institution as the place of occurrence of the external cause: Secondary | ICD-10-CM | POA: Insufficient documentation

## 2012-05-11 LAB — POCT INR: INR: 1.2

## 2012-05-11 LAB — POCT I-STAT 3, ART BLOOD GAS (G3+): Bicarbonate: 25.8 mEq/L — ABNORMAL HIGH (ref 20.0–24.0)

## 2012-05-11 SURGERY — JV LEFT AND RIGHT HEART CATHETERIZATION WITH CORONARY ANGIOGRAM
Anesthesia: Moderate Sedation

## 2012-05-11 MED ORDER — ASPIRIN 81 MG PO CHEW: 324.0000 mg | CHEWABLE_TABLET | ORAL | Status: DC

## 2012-05-11 MED ORDER — DIAZEPAM 5 MG PO TABS
5.0000 mg | ORAL_TABLET | Freq: Once | ORAL | Status: AC
  Administered 2012-05-11: 5 mg via ORAL

## 2012-05-11 MED ORDER — SODIUM CHLORIDE 0.9 % IJ SOLN
3.0000 mL | INTRAMUSCULAR | Status: DC | PRN
Start: 2012-05-11 — End: 2012-05-11

## 2012-05-11 MED ORDER — ACETAMINOPHEN 325 MG PO TABS: 650.0000 mg | ORAL_TABLET | ORAL | Status: DC | PRN

## 2012-05-11 MED ORDER — SODIUM CHLORIDE 0.9 % IV SOLN: INTRAVENOUS | Status: DC

## 2012-05-11 MED ORDER — SODIUM CHLORIDE 0.9 % IJ SOLN: 3.0000 mL | Freq: Two times a day (BID) | INTRAMUSCULAR | Status: DC

## 2012-05-11 MED ORDER — SODIUM CHLORIDE 0.9 % IV SOLN: 1.0000 mL/kg/h | INTRAVENOUS | Status: DC

## 2012-05-11 MED ORDER — ONDANSETRON HCL 4 MG/2ML IJ SOLN: 4.0000 mg | Freq: Four times a day (QID) | INTRAMUSCULAR | Status: DC | PRN

## 2012-05-11 MED ORDER — SODIUM CHLORIDE 0.9 % IV SOLN: 250.0000 mL | INTRAVENOUS | Status: DC | PRN

## 2012-05-11 NOTE — OR Nursing (Signed)
Tegaderm and pressure dressing applied, site level 0, bedrest began at 1215

## 2012-05-11 NOTE — H&P (View-Only) (Signed)
                 [address]            [place] [address]          [phone]     CARDIOTHORACIC SURGERY OFFICE NOTE  Referring Provider is Hochrein, James, MD PCP is ROSS,CHARLES ALAN, MD   HPI:  Patient returns for followup of mitral valve prolapse with severe mitral regurgitation and persistent atrial fibrillation.  She was originally seen in consultation on September 20. Since then the patient has discussed matters at length with her family and she is now interested in proceeding with diagnostic cardiac catheterization and elective mitral valve repair with Maze procedure. She reports no new problems since her last office visit.   Current Outpatient Prescriptions  Medication Sig Dispense Refill  . Calcium Carbonate-Vitamin D (CALCIUM + D PO) Take by mouth. Taking 2 Tablet       . folic acid (FOLVITE) 1 MG tablet Take 1 mg by mouth daily. Take 2 tab a day      . furosemide (LASIX) 40 MG tablet Take 1 tablet (40 mg total) by mouth daily.  30 tablet  11  . methotrexate (RHEUMATREX) 2.5 MG tablet Take 10 mg by mouth once a week. Caution:Chemotherapy. Protect from light.       . metoprolol succinate (TOPROL XL) 25 MG 24 hr tablet Take 1 tablet (25 mg total) by mouth 2 (two) times daily.  60 tablet  11  . potassium chloride SA (KLOR-CON M20) 20 MEQ tablet Take 1 tablet (20 mEq total) by mouth daily.  30 tablet  11  . predniSONE (DELTASONE) 5 MG tablet Take 5 mg by mouth. 1/2 Tablet Every Other Day      . warfarin (COUMADIN) 3 MG tablet Take 1 tablet (3 mg total) by mouth as directed.  30 tablet  3      Physical Exam:   BP 97/65  Pulse 87  Resp 16  Ht 5' (1.524 m)  Wt 115 lb (52.164 kg)  BMI 22.46 kg/m2  SpO2 98%  General:  Well-appearing but frail  Chest:   Clear to auscultation  CV:   Irregular rhythm with systolic murmur  Incisions:  n/a  Abdomen:  Soft and nontender  Extremities:  Warm and well-perfused  Diagnostic  Tests:  n/a   Impression:  Mitral valve prolapse with likely flail segment of the posterior leaflet of the mitral valve and severe symptomatic mitral regurgitation with persistent atrial fibrillation and preserved left ventricular systolic function. Risks associated with surgical intervention will be somewhat elevated because of the patient's advanced age and long-standing history of steroid-dependent rheumatoid arthritis. She is single and lives alone and appears somewhat frail physically, but she remains completely functionally independent.   Plan:  I've discussed matters at length with the patient again in the office this afternoon. She is eager to proceed with catheterization and surgery in the near future. We tentatively plan for surgery on Tuesday, October 15. I discussed her case over the telephone with Dr. Hochrein we'll plan to proceed with catheterization one week earlier in order to give her at least a few days to recover from her catheterization and to allow her renal function to return to baseline. Because she remains and persistent atrial fibrillation and has relatively few other risk factors, we feel that she will not need to have bridging therapy with anticoagulation between the time of her catheterization and surgery.   I've instructed the patient not to resume taking either coumadin or methotrexate after her catheterization.  I will give her a prescription for amiodarone to begin one week prior to surgery while she is off of Coumadin. All of her questions been addressed. We'll plan to see her back here in the office on October 14 to review the results of her heart catheterization and make final plans for surgery.   Clarence H. Owen, MD 05/03/2012 4:35 PM      

## 2012-05-11 NOTE — OR Nursing (Signed)
Dr Cooper at bedside to discuss results and treatment plan with pt and family 

## 2012-05-11 NOTE — OR Nursing (Signed)
Meal served 

## 2012-05-11 NOTE — CV Procedure (Signed)
   Cardiac Catheterization Procedure Note  Name: Nancy Bush MRN: [id number] DOB: 11/24/1925  Procedure: Right Heart Cath, Left Heart Cath, Selective Coronary Angiography, LV angiography, Abdominal aortic angiography  Indication: Severe mitral regurgitation (preoperative study)   Procedural Details: The right groin was prepped, draped, and anesthetized with 1% lidocaine. Using the modified Seldinger technique a 4 French sheath was placed in the right femoral artery and a 6 French sheath was placed in the right femoral vein. A multipurpose catheter was used for the right heart catheterization. When the multipurpose catheter went into the right ventricle, I tried to use a wire to reach the RV outflow tract. The patient developed asystole and was unconscious for a very brief period (less than 15 seconds). Her rhythm came back quickly to a junctional escape rhythm with a heart rate of approximately 30 beats per minute. She was given 1 mg of atropine and IV fluids were run wide open. She slowly returned to her baseline rhythm of atrial fibrillation but this took several minutes. She required less than 10 seconds of CPR. After her hemodynamics stabilized, I elected to proceed with coronary angiography and the remaining portion of the left heart catheterization. The right heart catheterization was aborted for risk of further rhythm instability.  Standard Judkins catheters were used for selective coronary angiography and left ventriculography. There were no immediate procedural complications. The patient was transferred to the post catheterization recovery area for further monitoring.  Procedural Findings: Hemodynamics RA 12 RV 38/13 PA not recorded PCWP not recorded LV 106/21 AO 106/56  Oxygen saturations: AO 94  Coronary angiography: Coronary dominance: right  Left mainstem: Widely patent with minor luminal irregularity peer  Left anterior descending (LAD): The LAD is patent  throughout. There is mild stenosis at the first septal perforator estimated at approximately 30%. There is no significant obstructive disease throughout the course of the LAD. The first diagonal branch is patent and it is a small vessel.  Left circumflex (LCx): The circumflex is patent throughout. The first OM is patent. The AV groove circumflex is very small.  Right coronary artery (RCA): The RCA is dominant. There are minor irregularities in the proximal vessel. The mid vessel has a 30-40% stenosis. There is moderate calcification present. The PDA and posterolateral branches are patent.  Left ventriculography: Left ventricular function is vigorous. The left ventricular ejection fraction is 65-70%. There is severe mitral regurgitation present. The mitral annulus is heavily calcified.  Abdominal aortogram: The renal arteries are patent bilaterally. The abdominal aorta appears normal in caliber without significant stenosis or dilatation. The common, internal, and external iliac arteries are patent. The right common femoral artery is widely patent and appear suitable for cannulation.  Final Conclusions:   1. Mild nonobstructive coronary artery disease 2. Normal left ventricular systolic function with severe mitral regurgitation  Complications: Ventricular asystole during right heart catheterization. Suspect this was due to catheter manipulation around the right bundle in a patient with underlying left bundle branch block. She has recovered and is hemodynamically stable at the completion of the procedure. She is back in atrial fibrillation which is her baseline cardiac rhythm.  Tonny Bollman 05/11/2012, 12:01 PM

## 2012-05-11 NOTE — Patient Instructions (Signed)
Rocco Serene, RN 05/06/2012 3:39 PM Signed  Pt aware that she is scheduled for R and L heart cath on Tuesday 10/8 at 10:30 am. She is to report at 9:30 am. 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 She knows not to take Coumadin after tonight.

## 2012-05-11 NOTE — OR Nursing (Signed)
Discharge instructions reviewed and signed, pt stated understanding, ambulated in hall without difficulty, site level 0, transported to friend's car via wheelchair 

## 2012-05-11 NOTE — Interval H&P Note (Signed)
History and Physical Interval Note:  05/11/2012 11:17 AM  Nancy Bush  has presented today for surgery, with the diagnosis of Mitral Regurg  The various methods of treatment have been discussed with the patient and family. After consideration of risks, benefits and other options for treatment, the patient has consented to  Procedure(s) (LRB) with comments: JV LEFT AND RIGHT HEART CATHETERIZATION WITH CORONARY ANGIOGRAM (N/A) as a surgical intervention .  The patient's history has been reviewed, patient examined, no change in status, stable for surgery.  I have reviewed the patient's chart and labs.  Questions were answered to the patient's satisfaction.     Tonny Bollman

## 2012-05-14 ENCOUNTER — Ambulatory Visit (HOSPITAL_COMMUNITY)
Admission: RE | Admit: 2012-05-14 | Discharge: 2012-05-14 | Disposition: A | Discharge status: Home / Self Care | Payer: Medicare Other | Source: Ambulatory Visit | Attending: Thoracic Surgery (Cardiothoracic Vascular Surgery) | Admitting: Thoracic Surgery (Cardiothoracic Vascular Surgery)

## 2012-05-14 ENCOUNTER — Inpatient Hospital Stay (HOSPITAL_COMMUNITY)
Admission: RE | Admit: 2012-05-14 | Discharge: 2012-05-14 | Disposition: A | Discharge status: Home / Self Care | Payer: Medicare Other | Source: Ambulatory Visit | Attending: Thoracic Surgery (Cardiothoracic Vascular Surgery) | Admitting: Thoracic Surgery (Cardiothoracic Vascular Surgery)

## 2012-05-14 ENCOUNTER — Other Ambulatory Visit (HOSPITAL_COMMUNITY): Payer: Self-pay | Admitting: Radiology

## 2012-05-14 ENCOUNTER — Encounter (HOSPITAL_COMMUNITY): Payer: Self-pay

## 2012-05-14 ENCOUNTER — Encounter (HOSPITAL_COMMUNITY)
Admission: RE | Admit: 2012-05-14 | Discharge: 2012-05-14 | Disposition: A | Discharge status: Home / Self Care | Payer: Medicare Other | Source: Ambulatory Visit | Attending: Thoracic Surgery (Cardiothoracic Vascular Surgery) | Admitting: Thoracic Surgery (Cardiothoracic Vascular Surgery)

## 2012-05-14 DIAGNOSIS — J449 Chronic obstructive pulmonary disease, unspecified: Secondary | ICD-10-CM | POA: Diagnosis not present

## 2012-05-14 DIAGNOSIS — I517 Cardiomegaly: Secondary | ICD-10-CM | POA: Diagnosis not present

## 2012-05-14 DIAGNOSIS — I059 Rheumatic mitral valve disease, unspecified: Secondary | ICD-10-CM | POA: Insufficient documentation

## 2012-05-14 DIAGNOSIS — Z01810 Encounter for preprocedural cardiovascular examination: Secondary | ICD-10-CM | POA: Diagnosis not present

## 2012-05-14 DIAGNOSIS — Z01812 Encounter for preprocedural laboratory examination: Secondary | ICD-10-CM | POA: Diagnosis not present

## 2012-05-14 DIAGNOSIS — Z01811 Encounter for preprocedural respiratory examination: Secondary | ICD-10-CM | POA: Diagnosis not present

## 2012-05-14 DIAGNOSIS — J4489 Other specified chronic obstructive pulmonary disease: Secondary | ICD-10-CM | POA: Insufficient documentation

## 2012-05-14 HISTORY — DX: Essential (primary) hypertension: I10

## 2012-05-14 LAB — COMPREHENSIVE METABOLIC PANEL
ALT: 15 U/L (ref 0–35)
AST: 32 U/L (ref 0–37)
Calcium: 9.6 mg/dL (ref 8.4–10.5)
Potassium: 4.3 mEq/L (ref 3.5–5.1)
Sodium: 136 mEq/L (ref 135–145)
Total Protein: 6.4 g/dL (ref 6.0–8.3)

## 2012-05-14 LAB — ABO/RH: ABO/RH(D): B POS

## 2012-05-14 LAB — APTT: aPTT: 28 seconds (ref 24–37)

## 2012-05-14 LAB — BLOOD GAS, ARTERIAL
Drawn by: 344381
FIO2: 0.21 %
pCO2 arterial: 33.2 mmHg — ABNORMAL LOW (ref 35.0–45.0)
pH, Arterial: 7.464 — ABNORMAL HIGH (ref 7.350–7.450)
pO2, Arterial: 101 mmHg — ABNORMAL HIGH (ref 80.0–100.0)

## 2012-05-14 LAB — CBC
MCH: 29.8 pg (ref 26.0–34.0)
MCHC: 32.2 g/dL (ref 30.0–36.0)
Platelets: 141 10*3/uL — ABNORMAL LOW (ref 150–400)
RBC: 4.06 MIL/uL (ref 3.87–5.11)

## 2012-05-14 LAB — URINALYSIS, ROUTINE W REFLEX MICROSCOPIC
Hgb urine dipstick: NEGATIVE
Nitrite: NEGATIVE
Specific Gravity, Urine: 1.005 (ref 1.005–1.030)
Urobilinogen, UA: 0.2 mg/dL (ref 0.0–1.0)

## 2012-05-14 LAB — HEMOGLOBIN A1C
Hgb A1c MFr Bld: 6 % — ABNORMAL HIGH (ref <5.7)
Mean Plasma Glucose: 126 mg/dL — ABNORMAL HIGH (ref <117)

## 2012-05-14 LAB — PULMONARY FUNCTION TEST

## 2012-05-14 LAB — SURGICAL PCR SCREEN
MRSA, PCR: NEGATIVE
Staphylococcus aureus: POSITIVE — AB

## 2012-05-14 MED ORDER — ALBUTEROL SULFATE (5 MG/ML) 0.5% IN NEBU
2.5000 mg | INHALATION_SOLUTION | Freq: Once | RESPIRATORY_TRACT | Status: AC
  Administered 2012-05-14: 2.5 mg via RESPIRATORY_TRACT

## 2012-05-14 NOTE — Pre-Procedure Instructions (Signed)
[address] Denice Cardon Pho  05/14/2012   Your procedure is scheduled on:  Tuesday October 15  Report to [facility] at 5:30 AM.  Call this number if you have problems the morning of surgery: [phone]   Remember:   Do not eat or drink:After Midnight.    Take these medicines the morning of surgery with A SIP OF WATER: Metoprolol (Toprol). Prednisone if dose due for that day.    Do not wear jewelry, make-up or nail polish.  Do not wear lotions, powders, or perfumes. You may wear deodorant.  Do not shave 48 hours prior to surgery. Men may shave face and neck.  Do not bring valuables to the hospital.  Contacts, dentures or bridgework may not be worn into surgery.  Leave suitcase in the car. After surgery it may be brought to your room.  For patients admitted to the hospital, checkout time is 11:00 AM the day of discharge.   Patients discharged the day of surgery will not be allowed to drive home.  Name and phone number of your driver: NA  Special Instructions: Incentive Spirometry - Practice and bring it with you on the day of surgery. Shower using CHG 2 nights before surgery and the night before surgery.  If you shower the day of surgery use CHG.  Use special wash - you have one bottle of CHG for all showers.  You should use approximately 1/3 of the bottle for each shower.   Please read over the following fact sheets that you were given: Pain Booklet, Coughing and Deep Breathing, Blood Transfusion Information and Surgical Site Infection Prevention

## 2012-05-14 NOTE — Progress Notes (Signed)
Chart left for anesthesia review of EKG. Old EKG in chart for comparison.

## 2012-05-14 NOTE — Progress Notes (Signed)
Pre-op Cardiac Surgery  Carotid Findings:  No evidence of hemodynamically significant internal carotid artery stenosis. Vertebral arteries are patent with antegrade flow.   Upper Extremity Right Left  Brachial Pressures 108-Triphasic 105-Triphasic  Radial Waveforms Triphasic Triphasic  Ulnar Waveforms Triphasic Biphasic  Palmar Arch (Allen's Test) Within normal limits Signal obliterates with radial compression, is unchanged with ulnar compression.    05/14/2012 12:56 PM Gertie Fey, RDMS, RDCS

## 2012-05-17 ENCOUNTER — Ambulatory Visit: Payer: Medicare Other | Admitting: Thoracic Surgery (Cardiothoracic Vascular Surgery)

## 2012-05-17 ENCOUNTER — Ambulatory Visit (INDEPENDENT_AMBULATORY_CARE_PROVIDER_SITE_OTHER): Payer: Medicare Other | Admitting: Thoracic Surgery (Cardiothoracic Vascular Surgery)

## 2012-05-17 ENCOUNTER — Encounter: Payer: Self-pay | Admitting: Thoracic Surgery (Cardiothoracic Vascular Surgery)

## 2012-05-17 VITALS — BP 115/78 | HR 122 | Resp 20 | Ht 60.0 in | Wt 115.0 lb

## 2012-05-17 DIAGNOSIS — I059 Rheumatic mitral valve disease, unspecified: Secondary | ICD-10-CM

## 2012-05-17 DIAGNOSIS — I4891 Unspecified atrial fibrillation: Secondary | ICD-10-CM | POA: Insufficient documentation

## 2012-05-17 DIAGNOSIS — I340 Nonrheumatic mitral (valve) insufficiency: Secondary | ICD-10-CM | POA: Insufficient documentation

## 2012-05-17 MED ORDER — DEXTROSE 5 % IV SOLN
1.5000 g | INTRAVENOUS | Status: AC
  Administered 2012-05-18: 1.5 g via INTRAVENOUS
  Administered 2012-05-18: 750 g via INTRAVENOUS
  Filled 2012-05-17 (×2): qty 1.5

## 2012-05-17 MED ORDER — PLASMA-LYTE 148 IV SOLN
INTRAVENOUS | Status: DC
  Filled 2012-05-17: qty 2.5

## 2012-05-17 MED ORDER — CHLORHEXIDINE GLUCONATE 4 % EX LIQD: 30.0000 mL | CUTANEOUS | Status: DC

## 2012-05-17 MED ORDER — DEXMEDETOMIDINE HCL IN NACL 400 MCG/100ML IV SOLN
0.1000 ug/kg/h | INTRAVENOUS | Status: AC
  Administered 2012-05-18: 0.2 ug/kg/h via INTRAVENOUS
  Filled 2012-05-17: qty 100

## 2012-05-17 MED ORDER — DEXTROSE 5 % IV SOLN
750.0000 mg | INTRAVENOUS | Status: DC
  Filled 2012-05-17: qty 750

## 2012-05-17 MED ORDER — SODIUM CHLORIDE 0.9 % IV SOLN
INTRAVENOUS | Status: AC
  Administered 2012-05-18: 1 [IU]/h via INTRAVENOUS
  Filled 2012-05-17: qty 1

## 2012-05-17 MED ORDER — DOPAMINE-DEXTROSE 3.2-5 MG/ML-% IV SOLN
2.0000 ug/kg/min | INTRAVENOUS | Status: DC
  Filled 2012-05-17: qty 250

## 2012-05-17 MED ORDER — MAGNESIUM SULFATE 50 % IJ SOLN
40.0000 meq | INTRAMUSCULAR | Status: DC
  Filled 2012-05-17: qty 10

## 2012-05-17 MED ORDER — PHENYLEPHRINE HCL 10 MG/ML IJ SOLN
30.0000 ug/min | INTRAVENOUS | Status: DC
  Filled 2012-05-17: qty 2

## 2012-05-17 MED ORDER — VANCOMYCIN HCL 1000 MG IV SOLR
1250.0000 mg | INTRAVENOUS | Status: AC
  Administered 2012-05-18: 1250 mg via INTRAVENOUS
  Filled 2012-05-17: qty 1250

## 2012-05-17 MED ORDER — METOPROLOL TARTRATE 12.5 MG HALF TABLET: 12.5000 mg | ORAL_TABLET | Freq: Once | ORAL | Status: DC

## 2012-05-17 MED ORDER — NITROGLYCERIN IN D5W 200-5 MCG/ML-% IV SOLN
2.0000 ug/min | INTRAVENOUS | Status: AC
  Administered 2012-05-18: 5 ug/min via INTRAVENOUS
  Filled 2012-05-17: qty 250

## 2012-05-17 MED ORDER — EPINEPHRINE HCL 1 MG/ML IJ SOLN
0.5000 ug/min | INTRAVENOUS | Status: DC
  Filled 2012-05-17: qty 4

## 2012-05-17 MED ORDER — SODIUM CHLORIDE 0.9 % IV SOLN
INTRAVENOUS | Status: AC
  Administered 2012-05-18: 70 mL/h via INTRAVENOUS
  Filled 2012-05-17: qty 40

## 2012-05-17 MED ORDER — POTASSIUM CHLORIDE 2 MEQ/ML IV SOLN
80.0000 meq | INTRAVENOUS | Status: DC
  Filled 2012-05-17: qty 40

## 2012-05-17 NOTE — Progress Notes (Signed)
[address]            Nancy Bush [id number]          [phone]     CARDIOTHORACIC SURGERY OFFICE NOTE  Referring Provider is Rollene Rotunda, MD PCP is [name], MD   HPI:  Patient returns for followup of severe symptomatic mitral regurgitation with persistent atrial fibrillation with tentative plans to proceed with elective mitral valve repair and Maze procedure tomorrow. She underwent left and right heart catheterization on 05/11/2012 by Dr. Excell Seltzer. Findings at the time of catheterization were notable for the absence of significant coronary artery disease. Left ventricular systolic function was normal. There was severe mitral regurgitation with only mildly elevated pulmonary right ventricular pressures. It should be noted that she has underlying left bundle branch block on her baseline electrocardiogram, and during right heart catheterization patient developed asystole for which she underwent close chest CPR and was administrated intravenous atropine. Her rhythm stabilized and she was discharged home uneventfully.  Since then she notes that her chest has remained sore but this has gradually improved. She also notes more exertional shortness of breath. The remainder of her review of systems is unchanged from previously.   Current Outpatient Prescriptions  Medication Sig Dispense Refill  . Calcium Carbonate-Vitamin D (CALCIUM 600 + D PO) Take 1 tablet by mouth 2 (two) times daily.      . folic acid (FOLVITE) 1 MG tablet Take 2 mg by mouth daily.       . furosemide (LASIX) 40 MG tablet Take 1 tablet (40 mg total) by mouth daily.  30 tablet  11  . metoprolol succinate (TOPROL-XL) 25 MG 24 hr tablet Take 25 mg by mouth 2 (two) times daily.      . potassium chloride SA (KLOR-CON M20) 20 MEQ tablet Take 1 tablet (20 mEq total) by mouth daily.  30 tablet  11  . predniSONE (DELTASONE) 5 MG tablet Take 2.5 mg by mouth every other day.       . methotrexate  (RHEUMATREX) 2.5 MG tablet Take 10 mg by mouth once a week. On Thursday      Caution:Chemotherapy. Protect from light.      . warfarin (COUMADIN) 3 MG tablet Take 1 tablet (3 mg total) by mouth as directed.  30 tablet  3      Physical Exam:   BP 115/78  Pulse 122  Resp 20  Ht 5' (1.524 m)  Wt 115 lb (52.164 kg)  BMI 22.46 kg/m2  SpO2 98%  General:  Frail but well-appearing  Chest:   Clear to auscultation with symmetrical breath sounds  CV:   Irregular rhythm with prominent systolic murmur  Incisions:  n/a  Abdomen:  Soft and nontender  Extremities:  Warm and well-perfused with no lower extremity edema  Diagnostic Tests:  Cardiac Catheterization Procedure Note  Name: Nancy Bush  MRN: [id number]  DOB: 02-08-1926  Procedure: Right Heart Cath, Left Heart Cath, Selective Coronary Angiography, LV angiography, Abdominal aortic angiography  Indication: Severe mitral regurgitation (preoperative study)  Procedural Details: The right groin was prepped, draped, and anesthetized with 1% lidocaine. Using the modified Seldinger technique a 4 French sheath was placed in the right femoral artery and a 6 French sheath was placed in the right femoral vein. A multipurpose catheter was used for the right heart catheterization. When the multipurpose catheter went into the  right ventricle, I tried to use a wire to reach the RV outflow tract. The patient developed asystole and was unconscious for a very brief period (less than 15 seconds). Her rhythm came back quickly to a junctional escape rhythm with a heart rate of approximately 30 beats per minute. She was given 1 mg of atropine and IV fluids were run wide open. She slowly returned to her baseline rhythm of atrial fibrillation but this took several minutes. She required less than 10 seconds of CPR. After her hemodynamics stabilized, I elected to proceed with coronary angiography and the remaining portion of the left heart catheterization. The right  heart catheterization was aborted for risk of further rhythm instability.  Standard Judkins catheters were used for selective coronary angiography and left ventriculography. There were no immediate procedural complications. The patient was transferred to the post catheterization recovery area for further monitoring.  Procedural Findings:  Hemodynamics  RA 12  RV 38/13  PA not recorded  PCWP not recorded  LV 106/21  AO 106/56  Oxygen saturations:  AO 94  Coronary angiography:  Coronary dominance: right  Left mainstem: Widely patent with minor luminal irregularity peer  Left anterior descending (LAD): The LAD is patent throughout. There is mild stenosis at the first septal perforator estimated at approximately 30%. There is no significant obstructive disease throughout the course of the LAD. The first diagonal branch is patent and it is a small vessel.  Left circumflex (LCx): The circumflex is patent throughout. The first OM is patent. The AV groove circumflex is very small.  Right coronary artery (RCA): The RCA is dominant. There are minor irregularities in the proximal vessel. The mid vessel has a 30-40% stenosis. There is moderate calcification present. The PDA and posterolateral branches are patent.  Left ventriculography: Left ventricular function is vigorous. The left ventricular ejection fraction is 65-70%. There is severe mitral regurgitation present. The mitral annulus is heavily calcified.  Abdominal aortogram: The renal arteries are patent bilaterally. The abdominal aorta appears normal in caliber without significant stenosis or dilatation. The common, internal, and external iliac arteries are patent. The right common femoral artery is widely patent and appear suitable for cannulation.  Final Conclusions:  1. Mild nonobstructive coronary artery disease  2. Normal left ventricular systolic function with severe mitral regurgitation    Impression:  Mitral valve prolapse with severe  symptomatic mitral regurgitation with persistent atrial fibrillation.  Cardiac catheterization is notable for the absence of significant coronary artery disease.  Given the patient's baseline left bundle branch block and the events of her recent catheterization, she may be at increased risk for need for permanent pacemaker placement postoperatively.  Plan:  We plan to proceed with elective mitral valve repair and Maze procedure tomorrow.  Alternative surgical approaches have been discussed, including a comparison between conventional sternotomy and minimally-invasive techniques.  The relative risks and benefits of each have been reviewed as they pertain to the patient's specific circumstances, and all of their questions have been addressed.  The rationale for elective mitral valve repair surgery has been explained, including a comparison between surgery and continued medical therapy with close follow-up.  The likelihood of successful and durable valve repair has been discussed with particular reference to the findings of their recent echocardiogram.  Based upon these findings and previous experience, I have quoted them a greater than 80 percent likelihood of successful valve repair.  In the unlikely event that their valve cannot be successfully repaired, we discussed the possibility of replacing  the mitral valve using a mechanical prosthesis with the attendant need for long-term anticoagulation versus the alternative of replacing it using a bioprosthetic tissue valve with its potential for late structural valve deterioration and failure, depending upon the patient's longevity.  The patient specifically requests that if the mitral valve must be replaced that it be done using a bioprosthetic tissue valve.  All questions have been addressed.    Salvatore Decent. Cornelius Moras, MD 05/17/2012 11:24 AM

## 2012-05-17 NOTE — Consult Note (Signed)
Anesthesia Chart Review:  Patient is a 78 year old female scheduled for a minimally invasive MV repair, MAZE procedure on 05/18/12 by Dr. Cornelius Moras.  History includes non-smoker, asthma, afib, left BBB, MVP with severe MR, pulmonary HTN, HTN, RA PCP is Dr. [name]. Cardiologist is Dr. Rollene Rotunda.    EKG on 05/14/12 showed afib @ 105 bpm, LAD, left BBB.  She was in afib on her last [facility] EKG from 03/25/12.  Cardiac cath on 05/11/12 showed: Final Conclusions:  1. Mild nonobstructive coronary artery disease (mid RCA 30-40%, LAD 30% at first septal perforator). 2. Normal left ventricular systolic function, EF 65-70%, with severe mitral regurgitation.  Complications: Ventricular asystole during right heart catheterization. Suspect this was due to catheter manipulation around the right bundle in a patient with underlying left bundle branch block. She has recovered and is hemodynamically stable at the completion of the procedure. She is back in atrial fibrillation which is her baseline cardiac rhythm.   TEE on 02/09/12 showed normal LVEF, AV mildly thickened, No AI, Trace PI, Mild TR, no ASD/PFO, fixed plaque in the thoracic aorta, MV is mildly thickened with moderate prolapse of the posterior mitral leaflet with MR directed anteriorly through the LA The MR is 2-3/4 to 3/4 in severity.   2D echo impression on 12/11/11 showed: - The patient was in atrial fibrillation. Normal LV size and systolic function, EF 60-65%. There was severe prolapse of the P1 and P2 segments of the mitral valve with eccentric anteriorly-directed mitral regurgitation that is probably severe. Biatrial enlargement. Normal RV size and systolic function. Mild pulmonary hypertension. Moderate tricuspid regurgitation. Would consider evaluation of MR with TEE if patient would be a candidate for valve repair.  CXR on 05/14/12 showed: 1. No acute cardiopulmonary disease  2. Stable cardiomegaly with left atrial  enlargement  3. Calcification of the mitral valve annulus  4. Background changes of COPD.  Labs noted.  If no significant change in her status then anticipate she can proceed from an anesthesia standpoint.  She is scheduled to see Dr. Cornelius Moras today.   Shonna Chock, PA-C

## 2012-05-17 NOTE — H&P (Signed)
CARDIOTHORACIC SURGERY HISTORY AND PHYSICAL EXAM  Referring Provider is Rollene Rotunda, MD PCP is [name], MD    Chief Complaint   Patient presents with   .  Mitral Regurgitation       eval and treat...has had TEE   .  Atrial Fibrillation     HPI:  Patient is an 78 year old retired single female from [place] with known history of mitral valve prolapse with mitral regurgitation, pulmonary hypertension, persistent atrial fibrillation and congestive heart failure who has been referred to consider elective surgical intervention. The patient states that she was noted to have a heart murmur on routine physical exam many years ago.  She was diagnosed with mitral valve prolapse with mitral regurgitation, pulmonary hypertention, and tricuspid regurgitation by serial echocardiograms and has been followed for the past several years by Dr. Antoine Poche and Dr. Eden Emms. In October 2012 she presented with new onset persistent atrial fibrillation with acute exacerbation of congestive heart failure. She has been anticoagulated on Coumadin ever since.  She underwent transesophageal echocardiogram 02/09/2012 confirming the presence of mitral valve prolapse with moderate to severe mitral regurgitation and preserved left ventricular systolic function. She was referred for elective surgical consultation and initially seen on 04/23/2012.  At that time the patient reported having intermittent episodes of exertional shortness of breath off and on for the past year or 2. Over the past year her primary complaint has been that of progressive fatigue. She has had some atypical chest pains in the past but for the most part she has not had any significant chest pain either with activity or at rest. She denies any resting shortness of breath, PND, orthopnea, or syncope. She has had some bilateral lower extremity edema which was most part is reasonably well-controlled on low-dose diuretic therapy. She later  underwent left and right heart catheterization on 05/11/2012 by Dr. Excell Seltzer. Findings at the time of catheterization were notable for the absence of significant coronary artery disease. Left ventricular systolic function was normal. There was severe mitral regurgitation with only mildly elevated pulmonary right ventricular pressures. It should be noted that she has underlying left bundle branch block on her baseline electrocardiogram, and during right heart catheterization patient developed asystole for which she underwent close chest CPR and was administrated intravenous atropine. Her rhythm stabilized and she was discharged home uneventfully.  Since then she notes that her chest has remained sore but this has gradually improved. She also notes more exertional shortness of breath. The remainder of her review of systems is unchanged from previously.   Past Medical History  Diagnosis Date  . MVP (mitral valve prolapse)   . Pulmonary hypertension     MILD TO MODERATE BY ECHO  . History of chest pain   . Mitral regurgitation     MILD  . Asthma   . Rheumatoid arthritis   . Shortness of breath   . H/O: hysterectomy   . LBBB (left bundle branch block)     per discharge note 2003  . PVC (premature ventricular contraction)   . Atrial fibrillation   . Hypertension   . Heart murmur     Past Surgical History  Procedure Date  . Cholecystectomy   . Ovarian cyst removal   . Hysterectomy   . Appendectomy   . Cataract extraction   . Tee without cardioversion 02/09/2012    Procedure: TRANSESOPHAGEAL ECHOCARDIOGRAM (TEE);  Surgeon: Pricilla Riffle, MD;  Location: [facility] ENDOSCOPY;  Service:  Cardiovascular;  Laterality: N/A;    No family history on file.  Social History History  Substance Use Topics  . Smoking status: Never Smoker   . Smokeless tobacco: Not on file  . Alcohol Use: No    Prior to Admission medications   Medication Sig Start Date End Date Taking? Authorizing Provider  Calcium  Carbonate-Vitamin D (CALCIUM 600 + D PO) Take 1 tablet by mouth 2 (two) times daily.   Yes Historical Provider, MD  folic acid (FOLVITE) 1 MG tablet Take 2 mg by mouth daily.    Yes Historical Provider, MD  furosemide (LASIX) 40 MG tablet Take 1 tablet (40 mg total) by mouth daily. 06/10/11 06/09/12 Yes Beatrice Lecher, PA  methotrexate (RHEUMATREX) 2.5 MG tablet Take 10 mg by mouth once a week. On Thursday      Caution:Chemotherapy. Protect from light.   Yes Historical Provider, MD  metoprolol succinate (TOPROL-XL) 25 MG 24 hr tablet Take 25 mg by mouth 2 (two) times daily.   Yes Historical Provider, MD  potassium chloride SA (KLOR-CON M20) 20 MEQ tablet Take 1 tablet (20 mEq total) by mouth daily. 06/10/11 06/09/12 Yes Scott Moishe Spice, PA  predniSONE (DELTASONE) 5 MG tablet Take 2.5 mg by mouth every other day.  05/14/11  Yes Historical Provider, MD  warfarin (COUMADIN) 3 MG tablet Take 1 tablet (3 mg total) by mouth as directed. 09/23/11  Yes Rollene Rotunda, MD    Allergies  Allergen Reactions  . Promethazine Hcl Other (See Comments)    unknown  . Sulfonamide Derivatives Other (See Comments)    unknown  . Tequin Other (See Comments)    hallucinations    Review of Systems:              General:                      normal appetite, decreased energy, no weight gain, no weight loss, no fever             Cardiac:                      no chest pain with exertion, no chest pain at rest, +SOB with mild-moderate exertion, no resting SOB, no PND, no orthopnea, occasional palpitations, + arrhythmia, + atrial fibrillation, + LE edema, NO dizzy spells, no syncope             Respiratory:                + shortness of breath, no home oxygen, no productive cough, no dry cough, no bronchitis, no wheezing, no hemoptysis, + asthma in the past now well controlled since taking Prednisone, no pain with inspiration or cough, no sleep apnea, no CPAP at night             GI:                                no  difficulty swallowing, no reflux, no frequent heartburn, no hiatal hernia, no abdominal pain, no constipation, no diarrhea, no hematochezia, no hematemesis, no melena             GU:                              no dysuria,  no frequency, occasional urinary tract infection,  no hematuria, no kidney stones, + mild chronic kidney disease             Vascular:                     no pain suggestive of claudication, mild pain in feet, no leg cramps, no varicose veins, no DVT, no non-healing foot ulcer             Neuro:                         no stroke, no TIA's, no seizures, no headaches, notemporary blindness one eye,  no slurred speech, no peripheral neuropathy, no chronic pain, no instability of gait, mild memory/cognitive dysfunction             Musculoskeletal:         + arthritis, + joint swelling, no myalgias, no difficulty walking, no mobility               Skin:                            no rash, no itching, no skin infections, no pressure sores or ulcerations             Psych:                         no anxiety, no depression, no nervousness, no unusual recent stress             Eyes:                           no blurry vision, no floaters, no recent vision changes, + wears glasses or contacts             ENT:                            no hearing loss, no loose or painful teeth, no dentures, last saw dentist 6 months ago             Hematologic:               + easy bruising, no abnormal bleeding, no clotting disorder, no frequent epistaxis, no complications on coumadin therapy             Endocrine:                   no diabetes                           Physical Exam:              BP 122/75  Pulse 77  Resp 16  Ht 5' (1.524 m)  Wt 115 lb (52.164 kg)  BMI 22.46 kg/m2  SpO2 99%             General:                      Elderly and somewhat frail but otherwise well-appearing             HEENT:                       Unremarkable  Neck:                           no JVD, no  bruits, no adenopathy               Chest:                         clear to auscultation, symmetrical breath sounds, no wheezes, no rhonchi               CV:                              RRR, grade IV/VI holosystolic murmur               Abdomen:                    soft, non-tender, no masses               Extremities:                 warm, well-perfused, pulses diminished, mild bilateral LE edema             Rectal/GU                   Deferred             Neuro:                         Grossly non-focal and symmetrical throughout             Skin:                            Clean and dry, no rashes, no breakdown but thin and frail   Diagnostic Tests:  Transesophageal Echocardiography  Patient: Maeson, Lourenco MR #: [id number] Study Date: 02/09/2012 Gender: F Age: 40 Height: 152.4cm Weight: 53.2kg BSA: 1.32m^2 Pt. Status: Room: [facility]  ADMITTING Dietrich Pates ATTENDING Dietrich Pates ORDERING [place], Gunnar Fusi PERFORMING Dietrich Pates REFERRING [place], [place] SONOGRAPHER Emelia Loron, RDCS cc:  ------------------------------------------------------------  ------------------------------------------------------------ Indications: Mitral regurgitation 424.0.  ------------------------------------------------------------ Study Conclusions  Mitral valve: MV is mildly thickened. There is moderate prolapse of the posterior mitral leaflet with MR directed anteriorly through the LA The MR is 2-3/4 to 3/4 in severity. Transesophageal echocardiography. 2D and color Doppler. Height: Height: 152.4cm. Height: 60in. Weight: Weight: 53.2kg. Weight: 117lb. Body mass index: BMI: 22.9kg/m^2. Body surface area: BSA: 1.35m^2. Patient status: Outpatient. Location: Endoscopy.  ------------------------------------------------------------  ------------------------------------------------------------ Left ventricle: LVEF is normal.  ------------------------------------------------------------ Aortic  valve: AV is mildly thickened. No AI.  ------------------------------------------------------------ Aorta: Mild fixed plaque in the thoracic aorta.  ------------------------------------------------------------ Mitral valve: MV is mildly thickened. There is moderate prolapse of the posterior mitral leaflet with MR directed anteriorly through the LA The MR is 2-3/4 to 3/4 in severity.  ------------------------------------------------------------ Left atrium: LA is enlarged. No evidence of thrombus in the atrial cavity or appendage.  ------------------------------------------------------------ Atrial septum: No ASD/PFO by color doppler or with injection of agitated saline.  ------------------------------------------------------------ Pulmonic valve: PV is normal. Trace PI.  ------------------------------------------------------------ Tricuspid valve: TV is normal. Mild TR.  ------------------------------------------------------------ Post procedure conclusions Ascending Aorta:  - Mild fixed plaque in the thoracic aorta.  ------------------------------------------------------------ Prepared and Electronically Authenticated by  Dietrich Pates 2013-07-08T17:07:[id number]  Images from the above referenced transesophageal echocardiogram are directly reviewed. The archives images are somewhat suboptimal, but there is clearly prolapse involving what appears to be the middle scallop of the posterior leaflet of the mitral valve with severe mitral regurgitation. This segment of the valve is likely flail in the jet of regurgitation courses anteriorly around the left atrium. There is left atrial enlargement. There is normal left ventricular size and systolic function. The aortic valve appears normal. Right ventricular size and function appears normal. There is mild to moderate tricuspid regurgitation. Tricuspid regurgitation is somewhat eccentrically directed.  Cardiac Catheterization Procedure  Note  Name: ANNALEAH ARATA   MRN: [id number]   DOB: November 20, 1925   Procedure: Right Heart Cath, Left Heart Cath, Selective Coronary Angiography, LV angiography, Abdominal aortic angiography   Indication: Severe mitral regurgitation (preoperative study)   Procedural Details: The right groin was prepped, draped, and anesthetized with 1% lidocaine. Using the modified Seldinger technique a 4 French sheath was placed in the right femoral artery and a 6 French sheath was placed in the right femoral vein. A multipurpose catheter was used for the right heart catheterization. When the multipurpose catheter went into the right ventricle, I tried to use a wire to reach the RV outflow tract. The patient developed asystole and was unconscious for a very brief period (less than 15 seconds). Her rhythm came back quickly to a junctional escape rhythm with a heart rate of approximately 30 beats per minute. She was given 1 mg of atropine and IV fluids were run wide open. She slowly returned to her baseline rhythm of atrial fibrillation but this took several minutes. She required less than 10 seconds of CPR. After her hemodynamics stabilized, I elected to proceed with coronary angiography and the remaining portion of the left heart catheterization. The right heart catheterization was aborted for risk of further rhythm instability.   Standard Judkins catheters were used for selective coronary angiography and left ventriculography. There were no immediate procedural complications. The patient was transferred to the post catheterization recovery area for further monitoring.   Procedural Findings:   Hemodynamics   RA 12   RV 38/13   PA not recorded   PCWP not recorded   LV 106/21   AO 106/56   Oxygen saturations:   AO 94   Coronary angiography:   Coronary dominance: right   Left mainstem: Widely patent with minor luminal irregularity peer   Left anterior descending (LAD): The LAD is patent throughout. There is mild  stenosis at the first septal perforator estimated at approximately 30%. There is no significant obstructive disease throughout the course of the LAD. The first diagonal branch is patent and it is a small vessel.   Left circumflex (LCx): The circumflex is patent throughout. The first OM is patent. The AV groove circumflex is very small.   Right coronary artery (RCA): The RCA is dominant. There are minor irregularities in the proximal vessel. The mid vessel has a 30-40% stenosis. There is moderate calcification present. The PDA and posterolateral branches are patent.   Left ventriculography: Left ventricular function is vigorous. The left ventricular ejection fraction is 65-70%. There is severe mitral regurgitation present. The mitral annulus is heavily calcified.   Abdominal aortogram: The renal arteries are patent bilaterally. The abdominal aorta appears normal in caliber without significant stenosis or dilatation. The common, internal, and external iliac arteries are patent. The right common femoral artery is widely patent and appear suitable for cannulation.   Final Conclusions:  1. Mild nonobstructive coronary artery disease   2. Normal left ventricular systolic function with severe mitral regurgitation  Impression:  Mitral valve prolapse with likely flail segment of the posterior leaflet of the mitral valve and severe symptomatic mitral regurgitation with persistent atrial fibrillation and preserved left ventricular systolic function. Cardiac catheterization is notable for the absence of significant coronary artery disease.  Given the patient's baseline left bundle branch block and the events of her recent catheterization, she may be at increased risk for need for permanent pacemaker placement postoperatively.  Risks associated with surgical intervention will also be somewhat elevated because of the patient's advanced age and long-standing history of steroid-dependent rheumatoid arthritis. She is  single and lives alone and appears somewhat frail physically, but she remains completely functionally independent.   Plan:  We plan to proceed with elective mitral valve repair and Maze procedure tomorrow.  Alternative surgical approaches have been discussed, including a comparison between conventional sternotomy and minimally-invasive techniques.  The relative risks and benefits of each have been reviewed as they pertain to the patient's specific circumstances, and all of their questions have been addressed.  The rationale for elective mitral valve repair surgery has been explained, including a comparison between surgery and continued medical therapy with close follow-up.  The likelihood of successful and durable valve repair has been discussed with particular reference to the findings of their recent echocardiogram.  Based upon these findings and previous experience, I have quoted her a greater than 80 percent likelihood of successful valve repair.  In the unlikely event that their valve cannot be successfully repaired, we discussed the possibility of replacing the mitral valve using a mechanical prosthesis with the attendant need for long-term anticoagulation versus the alternative of replacing it using a bioprosthetic tissue valve with its potential for late structural valve deterioration and failure, depending upon the patient's longevity.  The patient specifically requests that if the mitral valve must be replaced that it be done using a bioprosthetic tissue valve.   We discussed the length expectations regarding her postoperative convalescence including the likelihood that she would need at least temporary placement in a rehabilitation facility or make arrangements with her family for care at home.  All questions have been addressed.      Salvatore Decent. Cornelius Moras, MD

## 2012-05-18 ENCOUNTER — Inpatient Hospital Stay (HOSPITAL_COMMUNITY)
Admission: RE | Admit: 2012-05-18 | Discharge: 2012-05-28 | DRG: 219 | Disposition: A | Discharge status: Skilled Nursing Facility | Payer: Medicare Other | Source: Ambulatory Visit | Attending: Thoracic Surgery (Cardiothoracic Vascular Surgery) | Admitting: Thoracic Surgery (Cardiothoracic Vascular Surgery)

## 2012-05-18 ENCOUNTER — Encounter (HOSPITAL_COMMUNITY): Payer: Self-pay | Admitting: Anesthesiology

## 2012-05-18 ENCOUNTER — Inpatient Hospital Stay (HOSPITAL_COMMUNITY): Payer: Medicare Other

## 2012-05-18 ENCOUNTER — Inpatient Hospital Stay (HOSPITAL_COMMUNITY): Payer: Medicare Other | Admitting: Anesthesiology

## 2012-05-18 ENCOUNTER — Encounter (HOSPITAL_COMMUNITY)
Admission: RE | Disposition: A | Discharge status: Skilled Nursing Facility | Payer: Self-pay | Source: Ambulatory Visit | Attending: Thoracic Surgery (Cardiothoracic Vascular Surgery)

## 2012-05-18 ENCOUNTER — Encounter (HOSPITAL_COMMUNITY): Payer: Self-pay | Admitting: *Deleted

## 2012-05-18 DIAGNOSIS — J9600 Acute respiratory failure, unspecified whether with hypoxia or hypercapnia: Secondary | ICD-10-CM | POA: Diagnosis not present

## 2012-05-18 DIAGNOSIS — I447 Left bundle-branch block, unspecified: Secondary | ICD-10-CM | POA: Diagnosis present

## 2012-05-18 DIAGNOSIS — D62 Acute posthemorrhagic anemia: Secondary | ICD-10-CM | POA: Diagnosis not present

## 2012-05-18 DIAGNOSIS — I509 Heart failure, unspecified: Secondary | ICD-10-CM | POA: Diagnosis present

## 2012-05-18 DIAGNOSIS — Z79899 Other long term (current) drug therapy: Secondary | ICD-10-CM | POA: Diagnosis not present

## 2012-05-18 DIAGNOSIS — J988 Other specified respiratory disorders: Secondary | ICD-10-CM | POA: Diagnosis not present

## 2012-05-18 DIAGNOSIS — I442 Atrioventricular block, complete: Secondary | ICD-10-CM | POA: Diagnosis not present

## 2012-05-18 DIAGNOSIS — N189 Chronic kidney disease, unspecified: Secondary | ICD-10-CM | POA: Diagnosis present

## 2012-05-18 DIAGNOSIS — I2789 Other specified pulmonary heart diseases: Secondary | ICD-10-CM | POA: Diagnosis present

## 2012-05-18 DIAGNOSIS — E876 Hypokalemia: Secondary | ICD-10-CM | POA: Diagnosis not present

## 2012-05-18 DIAGNOSIS — Z882 Allergy status to sulfonamides status: Secondary | ICD-10-CM | POA: Diagnosis not present

## 2012-05-18 DIAGNOSIS — I059 Rheumatic mitral valve disease, unspecified: Secondary | ICD-10-CM | POA: Diagnosis not present

## 2012-05-18 DIAGNOSIS — M6281 Muscle weakness (generalized): Secondary | ICD-10-CM | POA: Diagnosis not present

## 2012-05-18 DIAGNOSIS — J9819 Other pulmonary collapse: Secondary | ICD-10-CM | POA: Diagnosis not present

## 2012-05-18 DIAGNOSIS — I5033 Acute on chronic diastolic (congestive) heart failure: Secondary | ICD-10-CM | POA: Diagnosis not present

## 2012-05-18 DIAGNOSIS — J449 Chronic obstructive pulmonary disease, unspecified: Secondary | ICD-10-CM | POA: Diagnosis present

## 2012-05-18 DIAGNOSIS — I517 Cardiomegaly: Secondary | ICD-10-CM | POA: Diagnosis not present

## 2012-05-18 DIAGNOSIS — J90 Pleural effusion, not elsewhere classified: Secondary | ICD-10-CM | POA: Diagnosis not present

## 2012-05-18 DIAGNOSIS — R0602 Shortness of breath: Secondary | ICD-10-CM | POA: Diagnosis not present

## 2012-05-18 DIAGNOSIS — Z7901 Long term (current) use of anticoagulants: Secondary | ICD-10-CM | POA: Diagnosis not present

## 2012-05-18 DIAGNOSIS — R110 Nausea: Secondary | ICD-10-CM | POA: Diagnosis not present

## 2012-05-18 DIAGNOSIS — R918 Other nonspecific abnormal finding of lung field: Secondary | ICD-10-CM | POA: Diagnosis not present

## 2012-05-18 DIAGNOSIS — J329 Chronic sinusitis, unspecified: Secondary | ICD-10-CM | POA: Diagnosis present

## 2012-05-18 DIAGNOSIS — M069 Rheumatoid arthritis, unspecified: Secondary | ICD-10-CM | POA: Diagnosis present

## 2012-05-18 DIAGNOSIS — I498 Other specified cardiac arrhythmias: Secondary | ICD-10-CM | POA: Diagnosis not present

## 2012-05-18 DIAGNOSIS — Z953 Presence of xenogenic heart valve: Secondary | ICD-10-CM

## 2012-05-18 DIAGNOSIS — I4891 Unspecified atrial fibrillation: Secondary | ICD-10-CM | POA: Diagnosis not present

## 2012-05-18 DIAGNOSIS — I129 Hypertensive chronic kidney disease with stage 1 through stage 4 chronic kidney disease, or unspecified chronic kidney disease: Secondary | ICD-10-CM | POA: Diagnosis present

## 2012-05-18 DIAGNOSIS — Y831 Surgical operation with implant of artificial internal device as the cause of abnormal reaction of the patient, or of later complication, without mention of misadventure at the time of the procedure: Secondary | ICD-10-CM | POA: Diagnosis not present

## 2012-05-18 DIAGNOSIS — R4182 Altered mental status, unspecified: Secondary | ICD-10-CM | POA: Diagnosis not present

## 2012-05-18 DIAGNOSIS — N185 Chronic kidney disease, stage 5: Secondary | ICD-10-CM | POA: Diagnosis not present

## 2012-05-18 DIAGNOSIS — J438 Other emphysema: Secondary | ICD-10-CM | POA: Diagnosis not present

## 2012-05-18 DIAGNOSIS — J4489 Other specified chronic obstructive pulmonary disease: Secondary | ICD-10-CM | POA: Diagnosis present

## 2012-05-18 DIAGNOSIS — J95811 Postprocedural pneumothorax: Secondary | ICD-10-CM | POA: Diagnosis not present

## 2012-05-18 DIAGNOSIS — Z888 Allergy status to other drugs, medicaments and biological substances status: Secondary | ICD-10-CM

## 2012-05-18 DIAGNOSIS — D696 Thrombocytopenia, unspecified: Secondary | ICD-10-CM | POA: Diagnosis not present

## 2012-05-18 DIAGNOSIS — Y9210 Unspecified residential institution as the place of occurrence of the external cause: Secondary | ICD-10-CM | POA: Diagnosis not present

## 2012-05-18 DIAGNOSIS — Z954 Presence of other heart-valve replacement: Secondary | ICD-10-CM | POA: Diagnosis not present

## 2012-05-18 DIAGNOSIS — IMO0002 Reserved for concepts with insufficient information to code with codable children: Secondary | ICD-10-CM | POA: Diagnosis not present

## 2012-05-18 DIAGNOSIS — J9383 Other pneumothorax: Secondary | ICD-10-CM | POA: Diagnosis not present

## 2012-05-18 DIAGNOSIS — D72829 Elevated white blood cell count, unspecified: Secondary | ICD-10-CM | POA: Diagnosis not present

## 2012-05-18 DIAGNOSIS — Z09 Encounter for follow-up examination after completed treatment for conditions other than malignant neoplasm: Secondary | ICD-10-CM | POA: Diagnosis not present

## 2012-05-18 DIAGNOSIS — R001 Bradycardia, unspecified: Secondary | ICD-10-CM | POA: Diagnosis not present

## 2012-05-18 DIAGNOSIS — I270 Primary pulmonary hypertension: Secondary | ICD-10-CM | POA: Diagnosis not present

## 2012-05-18 DIAGNOSIS — R279 Unspecified lack of coordination: Secondary | ICD-10-CM | POA: Diagnosis not present

## 2012-05-18 DIAGNOSIS — Z8679 Personal history of other diseases of the circulatory system: Secondary | ICD-10-CM

## 2012-05-18 HISTORY — DX: Presence of xenogenic heart valve: Z95.3

## 2012-05-18 HISTORY — DX: Personal history of other diseases of the circulatory system: Z86.79

## 2012-05-18 HISTORY — PX: MITRAL VALVE REPLACEMENT: SHX147

## 2012-05-18 HISTORY — DX: Other specified postprocedural states: Z98.890

## 2012-05-18 HISTORY — PX: MAZE: SHX5063

## 2012-05-18 LAB — HEMOGLOBIN AND HEMATOCRIT, BLOOD: HCT: 25.6 % — ABNORMAL LOW (ref 36.0–46.0)

## 2012-05-18 LAB — POCT I-STAT 4, (NA,K, GLUC, HGB,HCT)
Glucose, Bld: 102 mg/dL — ABNORMAL HIGH (ref 70–99)
Glucose, Bld: 102 mg/dL — ABNORMAL HIGH (ref 70–99)
Glucose, Bld: 94 mg/dL (ref 70–99)
Glucose, Bld: 113 mg/dL — ABNORMAL HIGH (ref 70–99)
Glucose, Bld: 120 mg/dL — ABNORMAL HIGH (ref 70–99)
HCT: 31 % — ABNORMAL LOW (ref 36.0–46.0)
HCT: 23 % — ABNORMAL LOW (ref 36.0–46.0)
HCT: 25 % — ABNORMAL LOW (ref 36.0–46.0)
HCT: 23 % — ABNORMAL LOW (ref 36.0–46.0)
Hemoglobin: 10.5 g/dL — ABNORMAL LOW (ref 12.0–15.0)
Hemoglobin: 7.8 g/dL — ABNORMAL LOW (ref 12.0–15.0)
Hemoglobin: 8.5 g/dL — ABNORMAL LOW (ref 12.0–15.0)
Hemoglobin: 8.5 g/dL — ABNORMAL LOW (ref 12.0–15.0)
Hemoglobin: 7.8 g/dL — ABNORMAL LOW (ref 12.0–15.0)
Hemoglobin: 10.2 g/dL — ABNORMAL LOW (ref 12.0–15.0)
Potassium: 3 mEq/L — ABNORMAL LOW (ref 3.5–5.1)
Potassium: 3.8 mEq/L (ref 3.5–5.1)
Potassium: 4 mEq/L (ref 3.5–5.1)
Potassium: 3.8 mEq/L (ref 3.5–5.1)
Potassium: 3 mEq/L — ABNORMAL LOW (ref 3.5–5.1)
Sodium: 138 mEq/L (ref 135–145)
Sodium: 140 mEq/L (ref 135–145)
Sodium: 141 mEq/L (ref 135–145)
Sodium: 146 mEq/L — ABNORMAL HIGH (ref 135–145)

## 2012-05-18 LAB — POCT I-STAT 3, ART BLOOD GAS (G3+)
Acid-base deficit: 2 mmol/L (ref 0.0–2.0)
Bicarbonate: 23.5 mEq/L (ref 20.0–24.0)
Bicarbonate: 24.8 mEq/L — ABNORMAL HIGH (ref 20.0–24.0)
Bicarbonate: 21.7 mEq/L (ref 20.0–24.0)
O2 Saturation: 100 %
Patient temperature: 34.7
TCO2: 26 mmol/L (ref 0–100)
TCO2: 23 mmol/L (ref 0–100)
pCO2 arterial: 36.1 mmHg (ref 35.0–45.0)
pH, Arterial: 7.421 (ref 7.350–7.450)
pH, Arterial: 7.496 — ABNORMAL HIGH (ref 7.350–7.450)
pO2, Arterial: 362 mmHg — ABNORMAL HIGH (ref 80.0–100.0)

## 2012-05-18 LAB — POCT I-STAT, CHEM 8
BUN: 7 mg/dL (ref 6–23)
HCT: 29 % — ABNORMAL LOW (ref 36.0–46.0)
Hemoglobin: 9.9 g/dL — ABNORMAL LOW (ref 12.0–15.0)
Sodium: 146 mEq/L — ABNORMAL HIGH (ref 135–145)
TCO2: 22 mmol/L (ref 0–100)

## 2012-05-18 LAB — CBC
Hemoglobin: 9.9 g/dL — ABNORMAL LOW (ref 12.0–15.0)
MCH: 29.9 pg (ref 26.0–34.0)
MCH: 29.6 pg (ref 26.0–34.0)
MCHC: 33.6 g/dL (ref 30.0–36.0)
MCV: 89.2 fL (ref 78.0–100.0)
Platelets: 85 10*3/uL — ABNORMAL LOW (ref 150–400)
RBC: 3.34 MIL/uL — ABNORMAL LOW (ref 3.87–5.11)

## 2012-05-18 LAB — PROTIME-INR: Prothrombin Time: 21.7 seconds — ABNORMAL HIGH (ref 11.6–15.2)

## 2012-05-18 LAB — CREATININE, SERUM: GFR calc Af Amer: 89 mL/min — ABNORMAL LOW (ref >90)

## 2012-05-18 LAB — MAGNESIUM: Magnesium: 3 mg/dL — ABNORMAL HIGH (ref 1.5–2.5)

## 2012-05-18 SURGERY — COX MAZE PROCEDURE
Anesthesia: General | Site: Chest | Laterality: Right

## 2012-05-18 MED ORDER — EPHEDRINE SULFATE 50 MG/ML IJ SOLN
INTRAMUSCULAR | Status: DC | PRN
  Administered 2012-05-18: 10 mg via INTRAVENOUS

## 2012-05-18 MED ORDER — SODIUM CHLORIDE 0.45 % IV SOLN
INTRAVENOUS | Status: DC
  Administered 2012-05-18: 16:00:00 via INTRAVENOUS

## 2012-05-18 MED ORDER — NITROGLYCERIN IN D5W 200-5 MCG/ML-% IV SOLN
0.0000 ug/min | INTRAVENOUS | Status: DC
  Filled 2012-05-18: qty 250

## 2012-05-18 MED ORDER — PHENYLEPHRINE HCL 10 MG/ML IJ SOLN
0.0000 ug/min | INTRAMUSCULAR | Status: DC
  Filled 2012-05-18: qty 2

## 2012-05-18 MED ORDER — FENTANYL CITRATE 0.05 MG/ML IJ SOLN
INTRAMUSCULAR | Status: DC | PRN
  Administered 2012-05-18: 50 ug via INTRAVENOUS
  Administered 2012-05-18: 250 ug via INTRAVENOUS
  Administered 2012-05-18: 200 ug via INTRAVENOUS
  Administered 2012-05-18 (×4): 250 ug via INTRAVENOUS

## 2012-05-18 MED ORDER — ASPIRIN EC 325 MG PO TBEC
325.0000 mg | DELAYED_RELEASE_TABLET | Freq: Every day | ORAL | Status: DC
  Administered 2012-05-21: 325 mg via ORAL
  Filled 2012-05-18 (×5): qty 1

## 2012-05-18 MED ORDER — DOPAMINE-DEXTROSE 3.2-5 MG/ML-% IV SOLN: 0.0000 ug/kg/min | INTRAVENOUS | Status: DC

## 2012-05-18 MED ORDER — MAGNESIUM SULFATE 40 MG/ML IJ SOLN
4.0000 g | Freq: Once | INTRAMUSCULAR | Status: AC
  Administered 2012-05-18: 4 g via INTRAVENOUS
  Filled 2012-05-18: qty 100

## 2012-05-18 MED ORDER — ASPIRIN 81 MG PO CHEW: 324.0000 mg | CHEWABLE_TABLET | Freq: Every day | ORAL | Status: DC

## 2012-05-18 MED ORDER — FAMOTIDINE IN NACL 20-0.9 MG/50ML-% IV SOLN
20.0000 mg | Freq: Two times a day (BID) | INTRAVENOUS | Status: AC
  Administered 2012-05-18 (×2): 20 mg via INTRAVENOUS
  Filled 2012-05-18: qty 50

## 2012-05-18 MED ORDER — POTASSIUM CHLORIDE 10 MEQ/50ML IV SOLN
10.0000 meq | INTRAVENOUS | Status: AC
  Administered 2012-05-18 (×3): 10 meq via INTRAVENOUS

## 2012-05-18 MED ORDER — DOPAMINE-DEXTROSE 3.2-5 MG/ML-% IV SOLN: 2.0000 ug/kg/min | INTRAVENOUS | Status: DC

## 2012-05-18 MED ORDER — HYDROCORTISONE SOD SUCCINATE 100 MG IJ SOLR
100.0000 mg | Freq: Three times a day (TID) | INTRAMUSCULAR | Status: AC
  Administered 2012-05-18 – 2012-05-19 (×3): 100 mg via INTRAVENOUS
  Filled 2012-05-18 (×3): qty 2

## 2012-05-18 MED ORDER — LACTATED RINGERS IV SOLN
INTRAVENOUS | Status: DC | PRN
  Administered 2012-05-18 (×3): via INTRAVENOUS

## 2012-05-18 MED ORDER — MIDAZOLAM HCL 2 MG/2ML IJ SOLN: 2.0000 mg | INTRAMUSCULAR | Status: DC | PRN

## 2012-05-18 MED ORDER — VANCOMYCIN HCL IN DEXTROSE 1-5 GM/200ML-% IV SOLN
1000.0000 mg | Freq: Once | INTRAVENOUS | Status: AC
  Administered 2012-05-18: 1000 mg via INTRAVENOUS
  Filled 2012-05-18: qty 200

## 2012-05-18 MED ORDER — MORPHINE SULFATE 2 MG/ML IJ SOLN: 1.0000 mg | INTRAMUSCULAR | Status: AC | PRN

## 2012-05-18 MED ORDER — ACETAMINOPHEN 10 MG/ML IV SOLN
1000.0000 mg | Freq: Once | INTRAVENOUS | Status: AC
  Administered 2012-05-18: 1000 mg via INTRAVENOUS
  Filled 2012-05-18: qty 100

## 2012-05-18 MED ORDER — SODIUM CHLORIDE 0.9 % IJ SOLN: 3.0000 mL | INTRAMUSCULAR | Status: DC | PRN

## 2012-05-18 MED ORDER — ALBUMIN HUMAN 5 % IV SOLN
250.0000 mL | INTRAVENOUS | Status: AC | PRN
  Administered 2012-05-18 – 2012-05-19 (×4): 250 mL via INTRAVENOUS
  Filled 2012-05-18 (×3): qty 250

## 2012-05-18 MED ORDER — MORPHINE SULFATE 2 MG/ML IJ SOLN: 2.0000 mg | INTRAMUSCULAR | Status: DC | PRN

## 2012-05-18 MED ORDER — CALCIUM CHLORIDE 10 % IV SOLN
1.0000 g | Freq: Once | INTRAVENOUS | Status: AC | PRN
  Filled 2012-05-18: qty 10

## 2012-05-18 MED ORDER — SODIUM CHLORIDE 0.9 % IV SOLN
INTRAVENOUS | Status: DC
  Filled 2012-05-18 (×2): qty 1

## 2012-05-18 MED ORDER — SODIUM CHLORIDE 0.9 % IR SOLN
Status: DC | PRN
  Administered 2012-05-18: 3000 mL

## 2012-05-18 MED ORDER — SODIUM CHLORIDE 0.9 % IV SOLN
INTRAVENOUS | Status: DC | PRN
  Administered 2012-05-18: 08:00:00 via INTRAVENOUS

## 2012-05-18 MED ORDER — SODIUM CHLORIDE 0.9 % IJ SOLN
3.0000 mL | Freq: Two times a day (BID) | INTRAMUSCULAR | Status: DC
  Administered 2012-05-19: 3 mL via INTRAVENOUS

## 2012-05-18 MED ORDER — PROTAMINE SULFATE 10 MG/ML IV SOLN
INTRAVENOUS | Status: DC | PRN
  Administered 2012-05-18: 300 mg via INTRAVENOUS

## 2012-05-18 MED ORDER — ACETAMINOPHEN 160 MG/5ML PO SOLN: 975.0000 mg | Freq: Four times a day (QID) | ORAL | Status: DC

## 2012-05-18 MED ORDER — INSULIN REGULAR BOLUS VIA INFUSION
0.0000 [IU] | Freq: Three times a day (TID) | INTRAVENOUS | Status: DC
  Filled 2012-05-18: qty 10

## 2012-05-18 MED ORDER — POTASSIUM CHLORIDE 10 MEQ/50ML IV SOLN
10.0000 meq | INTRAVENOUS | Status: AC
  Administered 2012-05-18 (×2): 10 meq via INTRAVENOUS

## 2012-05-18 MED ORDER — ROCURONIUM BROMIDE 100 MG/10ML IV SOLN
INTRAVENOUS | Status: DC | PRN
  Administered 2012-05-18: 50 mg via INTRAVENOUS

## 2012-05-18 MED ORDER — BISACODYL 10 MG RE SUPP: 10.0000 mg | Freq: Every day | RECTAL | Status: DC

## 2012-05-18 MED ORDER — ARTIFICIAL TEARS OP OINT
TOPICAL_OINTMENT | OPHTHALMIC | Status: DC | PRN
  Administered 2012-05-18: 1 via OPHTHALMIC

## 2012-05-18 MED ORDER — HYDROCORTISONE SOD SUCCINATE 100 MG IJ SOLR: 50.0000 mg | Freq: Three times a day (TID) | INTRAMUSCULAR | Status: DC

## 2012-05-18 MED ORDER — DOCUSATE SODIUM 100 MG PO CAPS
200.0000 mg | ORAL_CAPSULE | Freq: Every day | ORAL | Status: DC
  Administered 2012-05-20 – 2012-05-25 (×4): 200 mg via ORAL
  Filled 2012-05-18 (×8): qty 2

## 2012-05-18 MED ORDER — SODIUM CHLORIDE 0.9 % IV SOLN
INTRAVENOUS | Status: DC
  Administered 2012-05-18: 21:00:00 via INTRAVENOUS
  Administered 2012-05-19: 20 mL via INTRAVENOUS

## 2012-05-18 MED ORDER — METOPROLOL TARTRATE 1 MG/ML IV SOLN: 2.5000 mg | INTRAVENOUS | Status: DC | PRN

## 2012-05-18 MED ORDER — BISACODYL 5 MG PO TBEC
10.0000 mg | DELAYED_RELEASE_TABLET | Freq: Every day | ORAL | Status: DC
  Administered 2012-05-20 – 2012-05-25 (×5): 10 mg via ORAL
  Filled 2012-05-18 (×6): qty 2

## 2012-05-18 MED ORDER — HYDROCORTISONE SOD SUCCINATE 100 MG IJ SOLR
50.0000 mg | Freq: Three times a day (TID) | INTRAMUSCULAR | Status: AC
  Administered 2012-05-19 – 2012-05-20 (×3): 50 mg via INTRAVENOUS
  Filled 2012-05-18 (×3): qty 1

## 2012-05-18 MED ORDER — LACTATED RINGERS IV SOLN: INTRAVENOUS | Status: DC

## 2012-05-18 MED ORDER — METOPROLOL TARTRATE 12.5 MG HALF TABLET
12.5000 mg | ORAL_TABLET | Freq: Two times a day (BID) | ORAL | Status: DC
  Filled 2012-05-18 (×3): qty 1

## 2012-05-18 MED ORDER — ACETAMINOPHEN 500 MG PO TABS
1000.0000 mg | ORAL_TABLET | Freq: Four times a day (QID) | ORAL | Status: AC
  Administered 2012-05-19 – 2012-05-23 (×15): 1000 mg via ORAL
  Filled 2012-05-18 (×18): qty 2

## 2012-05-18 MED ORDER — SODIUM CHLORIDE 0.9 % IV SOLN: 250.0000 mL | INTRAVENOUS | Status: DC

## 2012-05-18 MED ORDER — MIDAZOLAM HCL 5 MG/5ML IJ SOLN
INTRAMUSCULAR | Status: DC | PRN
  Administered 2012-05-18: 2 mg via INTRAVENOUS
  Administered 2012-05-18: 3 mg via INTRAVENOUS
  Administered 2012-05-18: 1 mg via INTRAVENOUS

## 2012-05-18 MED ORDER — ONDANSETRON HCL 4 MG/2ML IJ SOLN
4.0000 mg | Freq: Four times a day (QID) | INTRAMUSCULAR | Status: DC | PRN
  Administered 2012-05-19 – 2012-05-24 (×4): 4 mg via INTRAVENOUS
  Filled 2012-05-18 (×5): qty 2

## 2012-05-18 MED ORDER — PANTOPRAZOLE SODIUM 40 MG PO TBEC
40.0000 mg | DELAYED_RELEASE_TABLET | Freq: Every day | ORAL | Status: DC
  Administered 2012-05-20 – 2012-05-28 (×7): 40 mg via ORAL
  Filled 2012-05-18 (×7): qty 1

## 2012-05-18 MED ORDER — OXYCODONE HCL 5 MG PO TABS: 5.0000 mg | ORAL_TABLET | ORAL | Status: DC | PRN

## 2012-05-18 MED ORDER — VECURONIUM BROMIDE 10 MG IV SOLR
INTRAVENOUS | Status: DC | PRN
  Administered 2012-05-18 (×3): 5 mg via INTRAVENOUS

## 2012-05-18 MED ORDER — HEPARIN SODIUM (PORCINE) 1000 UNIT/ML IJ SOLN
INTRAMUSCULAR | Status: DC | PRN
  Administered 2012-05-18: 15000 [IU] via INTRAVENOUS

## 2012-05-18 MED ORDER — ALBUMIN HUMAN 5 % IV SOLN
INTRAVENOUS | Status: DC | PRN
  Administered 2012-05-18 (×2): via INTRAVENOUS

## 2012-05-18 MED ORDER — METHYLPREDNISOLONE SODIUM SUCC 125 MG IJ SOLR
INTRAMUSCULAR | Status: DC | PRN
  Administered 2012-05-18: 125 mg via INTRAVENOUS

## 2012-05-18 MED ORDER — DEXMEDETOMIDINE HCL IN NACL 200 MCG/50ML IV SOLN
0.1000 ug/kg/h | INTRAVENOUS | Status: DC
  Filled 2012-05-18: qty 50

## 2012-05-18 MED ORDER — LIDOCAINE HCL (CARDIAC) 20 MG/ML IV SOLN
INTRAVENOUS | Status: DC | PRN
  Administered 2012-05-18: 40 mg via INTRAVENOUS

## 2012-05-18 MED ORDER — METOPROLOL TARTRATE 25 MG/10 ML ORAL SUSPENSION
12.5000 mg | Freq: Two times a day (BID) | ORAL | Status: DC
  Filled 2012-05-18 (×3): qty 5

## 2012-05-18 MED ORDER — DEXTROSE 5 % IV SOLN
1.5000 g | Freq: Two times a day (BID) | INTRAVENOUS | Status: DC
  Administered 2012-05-18 – 2012-05-19 (×3): 1.5 g via INTRAVENOUS
  Filled 2012-05-18 (×4): qty 1.5

## 2012-05-18 MED ORDER — PROPOFOL 10 MG/ML IV BOLUS
INTRAVENOUS | Status: DC | PRN
  Administered 2012-05-18: 40 mg via INTRAVENOUS

## 2012-05-18 SURGICAL SUPPLY — 158 items
ADAPTER CARDIO PERF ANTE/RETRO (ADAPTER) ×3 IMPLANT
ATTRACTOMAT 16X20 MAGNETIC DRP (DRAPES) ×3 IMPLANT
BAG DECANTER FOR FLEXI CONT (MISCELLANEOUS) ×3 IMPLANT
BENZOIN TINCTURE PRP APPL 2/3 (GAUZE/BANDAGES/DRESSINGS) ×3 IMPLANT
BLADE STERNUM SYSTEM 6 (BLADE) ×3 IMPLANT
BLADE SURG 11 STRL SS (BLADE) ×6 IMPLANT
CANISTER SUCTION 2500CC (MISCELLANEOUS) ×6 IMPLANT
CANN PRFSN 3/8X14X24FR PCFC (MISCELLANEOUS)
CANN PRFSN 3/8XCNCT ST RT ANG (MISCELLANEOUS)
CANNULA FEM VENOUS REMOTE 22FR (CANNULA) ×3 IMPLANT
CANNULA FEMORAL ART 14 SM (MISCELLANEOUS) ×3 IMPLANT
CANNULA GUNDRY RCSP 15FR (MISCELLANEOUS) ×3 IMPLANT
CANNULA OPTISITE PERFUSION 16F (CANNULA) ×6 IMPLANT
CANNULA OPTISITE PERFUSION 18F (CANNULA) ×3 IMPLANT
CANNULA PRFSN 3/8X14X24FR PCFC (MISCELLANEOUS) IMPLANT
CANNULA PRFSN 3/8XCNCT RT ANG (MISCELLANEOUS) IMPLANT
CANNULA VEN MTL TIP RT (MISCELLANEOUS)
CARDIAC SUCTION (MISCELLANEOUS) ×3 IMPLANT
CATH KIT ON Q 5IN SLV (PAIN MANAGEMENT) IMPLANT
CATH THORACIC 28FR RT ANG (CATHETERS) IMPLANT
CATH THORACIC 36FR (CATHETERS) IMPLANT
CLAMP ISOLATOR SYNERGY LG (MISCELLANEOUS) ×3 IMPLANT
CLIP FOGARTY SPRING 6M (CLIP) IMPLANT
CLOTH BEACON ORANGE TIMEOUT ST (SAFETY) ×3 IMPLANT
CONN 1/2X1/2X1/2  BEN (MISCELLANEOUS) ×1
CONN 1/2X1/2X1/2 BEN (MISCELLANEOUS) ×2 IMPLANT
CONN 3/8X1/2 ST GISH (MISCELLANEOUS) ×6 IMPLANT
CONN ST 1/4X3/8  BEN (MISCELLANEOUS) ×2
CONN ST 1/4X3/8 BEN (MISCELLANEOUS) ×4 IMPLANT
CONT SPEC STER OR (MISCELLANEOUS) ×3 IMPLANT
CONT SPECI 4OZ STER CLIK (MISCELLANEOUS) ×3 IMPLANT
COVER MAYO STAND STRL (DRAPES) ×3 IMPLANT
COVER PROBE W GEL 5X96 (DRAPES) ×3 IMPLANT
COVER SURGICAL LIGHT HANDLE (MISCELLANEOUS) ×6 IMPLANT
CRADLE DONUT ADULT HEAD (MISCELLANEOUS) ×3 IMPLANT
DERMABOND ADVANCED (GAUZE/BANDAGES/DRESSINGS) ×1
DERMABOND ADVANCED .7 DNX12 (GAUZE/BANDAGES/DRESSINGS) ×2 IMPLANT
DEVICE PMI PUNCTURE CLOSURE (MISCELLANEOUS) ×3 IMPLANT
DEVICE TROCAR PUNCTURE CLOSURE (ENDOMECHANICALS) ×3 IMPLANT
DRAIN CHANNEL 28F RND 3/8 FF (WOUND CARE) ×6 IMPLANT
DRAIN CHANNEL 32F RND 10.7 FF (WOUND CARE) IMPLANT
DRAPE BILATERAL SPLIT (DRAPES) ×3 IMPLANT
DRAPE C-ARM 42X72 X-RAY (DRAPES) ×3 IMPLANT
DRAPE CV SPLIT W-CLR ANES SCRN (DRAPES) ×3 IMPLANT
DRAPE INCISE IOBAN 66X45 STRL (DRAPES) ×6 IMPLANT
DRAPE SLUSH MACHINE 52X66 (DRAPES) IMPLANT
DRAPE SLUSH/WARMER DISC (DRAPES) ×3 IMPLANT
DRSG COVADERM 4X14 (GAUZE/BANDAGES/DRESSINGS) ×3 IMPLANT
DRSG COVADERM 4X8 (GAUZE/BANDAGES/DRESSINGS) ×3 IMPLANT
ELECT BLADE 6.5 EXT (BLADE) ×3 IMPLANT
ELECT REM PT RETURN 9FT ADLT (ELECTROSURGICAL) ×6
ELECTRODE REM PT RTRN 9FT ADLT (ELECTROSURGICAL) ×4 IMPLANT
FEMORAL VENOUS CANN RAP (CANNULA) IMPLANT
GLOVE BIO SURGEON STRL SZ 6 (GLOVE) ×12 IMPLANT
GLOVE BIO SURGEON STRL SZ 6.5 (GLOVE) ×3 IMPLANT
GLOVE BIO SURGEON STRL SZ7 (GLOVE) IMPLANT
GLOVE BIO SURGEON STRL SZ7.5 (GLOVE) IMPLANT
GLOVE BIOGEL PI IND STRL 6 (GLOVE) ×8 IMPLANT
GLOVE BIOGEL PI IND STRL 6.5 (GLOVE) ×4 IMPLANT
GLOVE BIOGEL PI IND STRL 7.0 (GLOVE) ×2 IMPLANT
GLOVE BIOGEL PI IND STRL 7.5 (GLOVE) ×2 IMPLANT
GLOVE BIOGEL PI INDICATOR 6 (GLOVE) ×4
GLOVE BIOGEL PI INDICATOR 6.5 (GLOVE) ×2
GLOVE BIOGEL PI INDICATOR 7.0 (GLOVE) ×1
GLOVE BIOGEL PI INDICATOR 7.5 (GLOVE) ×1
GLOVE ORTHO TXT STRL SZ7.5 (GLOVE) ×9 IMPLANT
GOWN STRL NON-REIN LRG LVL3 (GOWN DISPOSABLE) ×27 IMPLANT
GUIDEWIRE ANG ZIPWIRE 038X150 (WIRE) ×3 IMPLANT
HEMOSTAT POWDER SURGIFOAM 1G (HEMOSTASIS) ×9 IMPLANT
INSERT CONFORM CROSS CLAMP 66M (MISCELLANEOUS) ×3 IMPLANT
INSERT CONFORM CROSS CLAMP 86M (MISCELLANEOUS) ×3 IMPLANT
INSERT FOGARTY XLG (MISCELLANEOUS) ×3 IMPLANT
KIT BASIN OR (CUSTOM PROCEDURE TRAY) ×3 IMPLANT
KIT DILATOR VASC 18G NDL (KITS) ×6 IMPLANT
KIT DRAINAGE VACCUM ASSIST (KITS) ×3 IMPLANT
KIT ROOM TURNOVER OR (KITS) ×3 IMPLANT
KIT SUCTION CATH 14FR (SUCTIONS) ×9 IMPLANT
LEAD PACING MYOCARDI (MISCELLANEOUS) ×3 IMPLANT
LINE VENT (MISCELLANEOUS) ×3 IMPLANT
LOOP VESSEL SUPERMAXI WHITE (MISCELLANEOUS) ×3 IMPLANT
NEEDLE AORTIC ROOT 14G 7F (CATHETERS) ×3 IMPLANT
NS IRRIG 1000ML POUR BTL (IV SOLUTION) ×15 IMPLANT
PACK OPEN HEART (CUSTOM PROCEDURE TRAY) ×3 IMPLANT
PAD ARMBOARD 7.5X6 YLW CONV (MISCELLANEOUS) ×6 IMPLANT
PAD ELECT DEFIB RADIOL ZOLL (MISCELLANEOUS) ×3 IMPLANT
PATCH CORMATRIX 4CMX7CM (Prosthesis & Implant Heart) ×3 IMPLANT
PROBE CRYO2-ABLATION MALLABLE (MISCELLANEOUS) IMPLANT
RETRACTOR PVM SOFT TISSUE M (INSTRUMENTS) ×3 IMPLANT
RETRACTOR TRL SOFT TISSUE LG (INSTRUMENTS) IMPLANT
RETRACTOR TRM SOFT TISSUE 7.5 (INSTRUMENTS) IMPLANT
SET CANNULATION TOURNIQUET (MISCELLANEOUS) ×3 IMPLANT
SET CARDIOPLEGIA MPS 5001102 (MISCELLANEOUS) ×3 IMPLANT
SET IRRIG TUBING LAPAROSCOPIC (IRRIGATION / IRRIGATOR) ×3 IMPLANT
SOLUTION ANTI FOG 6CC (MISCELLANEOUS) ×3 IMPLANT
SPONGE GAUZE 4X4 12PLY (GAUZE/BANDAGES/DRESSINGS) ×6 IMPLANT
SUCKER INTRACARDIAC WEIGHTED (SUCKER) ×3 IMPLANT
SUCKER WEIGHTED FLEX (MISCELLANEOUS) ×6 IMPLANT
SUT BONE WAX W31G (SUTURE) ×6 IMPLANT
SUT E-PACK MINIMALLY INVASIVE (SUTURE) ×3 IMPLANT
SUT ETHIBOND (SUTURE) ×6 IMPLANT
SUT ETHIBOND 2 0 SH (SUTURE) ×11 IMPLANT
SUT ETHIBOND 2 0 SH 36X2 (SUTURE) ×4 IMPLANT
SUT ETHIBOND 2 0 V4 (SUTURE) IMPLANT
SUT ETHIBOND 2 0V4 GREEN (SUTURE) IMPLANT
SUT ETHIBOND 2-0 RB-1 WHT (SUTURE) ×6 IMPLANT
SUT ETHIBOND 4 0 TF (SUTURE) IMPLANT
SUT ETHIBOND 5 0 C 1 30 (SUTURE) IMPLANT
SUT ETHIBOND NAB MH 2-0 36IN (SUTURE) IMPLANT
SUT ETHIBOND X763 2 0 SH 1 (SUTURE) ×9 IMPLANT
SUT GORETEX 6.0 TH-9 30 IN (SUTURE) IMPLANT
SUT GORETEX CV 4 TH 22 36 (SUTURE) ×6 IMPLANT
SUT GORETEX CV-5THC-13 36IN (SUTURE) IMPLANT
SUT GORETEX CV4 TH-18 (SUTURE) ×6 IMPLANT
SUT GORETEX TH-18 36 INCH (SUTURE) IMPLANT
SUT MNCRL AB 3-0 PS2 18 (SUTURE) ×6 IMPLANT
SUT PDS AB 1 CTX 36 (SUTURE) ×6 IMPLANT
SUT PROLENE 3 0 SH 1 (SUTURE) ×3 IMPLANT
SUT PROLENE 3 0 SH DA (SUTURE) IMPLANT
SUT PROLENE 3 0 SH1 36 (SUTURE) ×15 IMPLANT
SUT PROLENE 4 0 RB 1 (SUTURE) ×7
SUT PROLENE 4 0 SH DA (SUTURE) ×6 IMPLANT
SUT PROLENE 4-0 RB1 .5 CRCL 36 (SUTURE) ×14 IMPLANT
SUT PROLENE 5 0 C 1 36 (SUTURE) IMPLANT
SUT PROLENE 6 0 C 1 30 (SUTURE) IMPLANT
SUT SILK  1 MH (SUTURE) ×1
SUT SILK 1 MH (SUTURE) ×2 IMPLANT
SUT SILK 1 TIES 10X30 (SUTURE) IMPLANT
SUT SILK 2 0 SH CR/8 (SUTURE) IMPLANT
SUT SILK 2 0 TIES 10X30 (SUTURE) IMPLANT
SUT SILK 2 0SH CR/8 30 (SUTURE) IMPLANT
SUT SILK 3 0 (SUTURE)
SUT SILK 3 0 SH CR/8 (SUTURE) IMPLANT
SUT SILK 3 0SH CR/8 30 (SUTURE) IMPLANT
SUT SILK 3-0 18XBRD TIE 12 (SUTURE) IMPLANT
SUT STEEL 6MS V (SUTURE) IMPLANT
SUT STEEL STERNAL CCS#1 18IN (SUTURE) IMPLANT
SUT STEEL SZ 6 DBL 3X14 BALL (SUTURE) IMPLANT
SUT TEM PAC WIRE 2 0 SH (SUTURE) IMPLANT
SUT VIC AB 2-0 CTX 27 (SUTURE) IMPLANT
SUT VIC AB 2-0 CTX 36 (SUTURE) IMPLANT
SUT VIC AB 2-0 UR6 27 (SUTURE) IMPLANT
SUT VIC AB 3-0 SH 8-18 (SUTURE) IMPLANT
SUT VICRYL 2 TP 1 (SUTURE) ×3 IMPLANT
SYRINGE 10CC LL (SYRINGE) ×3 IMPLANT
SYS ATRICLIP LAA EXCLUSION 45 (CLIP) IMPLANT
SYSTEM SAHARA CHEST DRAIN ATS (WOUND CARE) ×3 IMPLANT
TAPE CLOTH SURG 4X10 WHT LF (GAUZE/BANDAGES/DRESSINGS) ×6 IMPLANT
TOWEL OR 17X24 6PK STRL BLUE (TOWEL DISPOSABLE) ×6 IMPLANT
TOWEL OR 17X26 10 PK STRL BLUE (TOWEL DISPOSABLE) ×6 IMPLANT
TRAY FOLEY IC TEMP SENS 14FR (CATHETERS) ×3 IMPLANT
TROCAR XCEL BLADELESS 5X75MML (TROCAR) ×3 IMPLANT
TROCAR XCEL NON-BLD 11X100MML (ENDOMECHANICALS) ×6 IMPLANT
TUBE SUCT INTRACARD DLP 20F (MISCELLANEOUS) ×3 IMPLANT
TUNNELER SHEATH ON-Q 11GX8 (MISCELLANEOUS) IMPLANT
UNDERPAD 30X30 INCONTINENT (UNDERPADS AND DIAPERS) ×3 IMPLANT
VALVE MITRAL MAGNA 27 (Prosthesis & Implant Heart) ×3 IMPLANT
WATER STERILE IRR 1000ML POUR (IV SOLUTION) ×6 IMPLANT
WIRE BENTSON .035X145CM (WIRE) ×3 IMPLANT

## 2012-05-18 NOTE — Progress Notes (Signed)
Patient ID: Nancy Bush, female   DOB: 20-Jul-1926, 78 y.o.   MRN: [id number]                   [address]            [name] [id number]          [phone]     Day of Surgery Procedure(s) (LRB): MAZE (Right) MINIMALLY INVASIVE MITRAL VALVE (MV) REPLACEMENT (N/A)  Total Length of Stay:  LOS: 0 days  BP 107/53  Pulse 79  Temp 98.6 F (37 C) (Oral)  Resp 13  Wt 114 lb 13.8 oz (52.1 kg)  SpO2 99%  .Intake/Output      10/14 0701 - 10/15 0700 10/15 0701 - 10/16 0700   I.V. (mL/kg)  2687.6 (51.6)   Blood  868   IV Piggyback  920   Total Intake(mL/kg)  4475.6 (85.9)   Urine (mL/kg/hr)  3200 (5.3)   Blood  700   Chest Tube  170   Total Output  4070   Net  +405.6             . sodium chloride 20 mL/hr at 05/18/12 1530  . sodium chloride 20 mL/hr at 05/18/12 1530  . sodium chloride    . dexmedetomidine 0.5 mcg/kg/hr (05/18/12 1725)  . DOPamine 3 mcg/kg/min (05/18/12 1700)  . insulin (NOVOLIN-R) infusion 1 Units/hr (05/18/12 1800)  . lactated ringers 20 mL/hr at 05/18/12 1530  . nitroGLYCERIN 30 mcg/min (05/18/12 1815)  . phenylephrine (NEO-SYNEPHRINE) Adult infusion Stopped (05/18/12 1530)     Lab Results  Component Value Date   WBC 9.9 05/18/2012   HGB 10.2* 05/18/2012   HCT 30.0* 05/18/2012   PLT 85* 05/18/2012   GLUCOSE 93 05/18/2012   ALT 15 05/14/2012   AST 32 05/14/2012   NA 146* 05/18/2012   K 3.0* 05/18/2012   CL 101 05/14/2012   CREATININE 0.93 05/14/2012   BUN 11 05/14/2012   CO2 21 05/14/2012   INR 1.98* 05/18/2012   HGBA1C 6.0* 05/14/2012   INR elevated getting plasma Still sedated on the vent Hypokalemia being replaced   Delight Ovens MD  Beeper [phone] Office [phone] 05/18/2012 6:32 PM

## 2012-05-18 NOTE — Anesthesia Procedure Notes (Signed)
Procedure Name: Intubation Date/Time: 05/18/2012 8:18 AM Performed by: Angelica Pou Pre-anesthesia Checklist: Patient identified, Emergency Drugs available, Suction available, Patient being monitored and Timeout performed Patient Re-evaluated:Patient Re-evaluated prior to inductionOxygen Delivery Method: Circle system utilized Preoxygenation: Pre-oxygenation with 100% oxygen Intubation Type: IV induction Ventilation: Mask ventilation without difficulty and Oral airway inserted - appropriate to patient size Laryngoscope Size: Mac and 3 Grade View: Grade I Endobronchial tube: Left, Double lumen EBT, EBT position confirmed by fiberoptic bronchoscope and EBT position confirmed by auscultation and 35 Fr Number of attempts: 1 Airway Equipment and Method: Stylet and Oral airway Placement Confirmation: ETT inserted through vocal cords under direct vision,  breath sounds checked- equal and bilateral and positive ETCO2 Secured at: 28 cm Tube secured with: Tape Dental Injury: Teeth and Oropharynx as per pre-operative assessment

## 2012-05-18 NOTE — Interval H&P Note (Signed)
History and Physical Interval Note:  05/18/2012 7:41 AM  Nancy Bush  has presented today for surgery, with the diagnosis of MITRAL REGURGITATION, ATRIAL FIB  The various methods of treatment have been discussed with the patient and family. After consideration of risks, benefits and other options for treatment, the patient has consented to  Procedure(s) (LRB) with comments: MINIMALLY INVASIVE MITRAL VALVE REPAIR (MVR) (Right) MAZE (Right) as a surgical intervention .  The patient's history has been reviewed, patient examined, no change in status, stable for surgery.  I have reviewed the patient's chart and labs.  Questions were answered to the patient's satisfaction.     OWEN,CLARENCE H

## 2012-05-18 NOTE — Addendum Note (Signed)
Addendum  created 05/18/12 2022 by Kipp Brood, MD   Modules edited:Notes Section

## 2012-05-18 NOTE — Op Note (Signed)
CARDIOTHORACIC SURGERY OPERATIVE NOTE  Date of Procedure: 05/18/2012  Preoperative Diagnosis:   Severe Mitral Regurgitation  Recurrent Persistent Atrial Fibrillation  Postoperative Diagnosis:  Severe Mitral Regurgitation  Recurrent Persistent Atrial Fibrillation  Procedure:   Minimally-Invasive Mitral Valve Replacement   [facility] Mitral Bioprosthetic Tissue Valve, size 27mm, Model #[id number], serial [id number]  Maze Procedure   Complete biatrial lesion set with oversewing of LA appendage   Surgeon: Salvatore Decent. Cornelius Moras, MD  Assistant: Doree Fudge, PA-C  Anesthesia: Kipp Brood, MD  Operative Findings:  Fibroelastic deficiency and calcific degenerative disease  Type II and type IIIA dysfunction with severe mitral regurgitation  Normal left ventricular systolic function            BRIEF CLINICAL NOTE AND INDICATIONS FOR SURGERY  Patient is an 78 year old retired single female from [place] with known history of mitral valve prolapse with mitral regurgitation, pulmonary hypertension, persistent atrial fibrillation and congestive heart failure who has been referred to consider elective surgical intervention. The patient states that she was noted to have a heart murmur on routine physical exam many years ago.  She was diagnosed with mitral valve prolapse with mitral regurgitation, pulmonary hypertention, and tricuspid regurgitation by serial echocardiograms and has been followed for the past several years by Dr. Antoine Poche and Dr. Eden Emms. In October 2012 she presented with new onset persistent atrial fibrillation with acute exacerbation of congestive heart failure. She has been anticoagulated on Coumadin ever since.  She underwent transesophageal echocardiogram 02/09/2012 confirming the presence of mitral valve prolapse with moderate to severe mitral regurgitation and preserved left ventricular systolic function. She was referred for elective surgical consultation  and initially seen on 04/23/2012.  At that time the patient reported having intermittent episodes of exertional shortness of breath off and on for the past year or 2. Over the past year her primary complaint has been that of progressive fatigue. She has had some atypical chest pains in the past but for the most part she has not had any significant chest pain either with activity or at rest. She denies any resting shortness of breath, PND, orthopnea, or syncope. She has had some bilateral lower extremity edema which was most part is reasonably well-controlled on low-dose diuretic therapy. She later underwent left and right heart catheterization on 05/11/2012 by Dr. Excell Seltzer. Findings at the time of catheterization were notable for the absence of significant coronary artery disease. Left ventricular systolic function was normal. There was severe mitral regurgitation with only mildly elevated pulmonary right ventricular pressures. It should be noted that she has underlying left bundle branch block on her baseline electrocardiogram, and during right heart catheterization patient developed asystole for which she underwent close chest CPR and was administrated intravenous atropine. Her rhythm stabilized and she was discharged home uneventfully.  The patient has been counseled at length regarding the indications, risks and potential benefits of surgery.  All questions have been answered, and the patient provides full informed consent for the operation as described.    DETAILS OF THE OPERATIVE PROCEDURE  The patient is brought to the operating room on the above mentioned date and central monitoring was established by the anesthesia team including placement of Swan-Ganz catheter through the left internal jugular vein.  A radial arterial line is placed. The patient is placed in the supine position on the operating table.  Intravenous antibiotics are administered. General endotracheal anesthesia is induced uneventfully.  The patient is initially intubated using a dual lumen endotracheal tube.  A Foley catheter is  placed.  Baseline transesophageal echocardiogram was performed.  Findings were notable for fibroelastic deficiency type degenerative disease of the mitral valve with an obvious flail segment of the middle scallop of the posterior leaflet and severe mitral regurgitation. There is also severe calcific degenerative disease with severe calcification of the posterior annulus as well as severe calcification extending into both the anterior and posterior leaflets of the mitral valve. There is normal left ventricular systolic function. There is moderate to severe left atrial enlargement. Aortic valve appears normal.  A soft roll is placed behind the patient's left scapula and the neck gently extended and turned to the left.   The patient's right neck, chest, abdomen, both groins, and both lower extremities are prepared and draped in a sterile manner. A time out procedure is performed.  A small incision is made in the right inguinal crease and the anterior surface of the right common femoral artery and right common femoral vein are identified.  A right miniature anterolateral thoracotomy incision is performed. The incision is placed just lateral to and superior to the right nipple. The pectoralis major muscle is retracted medially and completely preserved. The right pleural space is entered through the 3rd intercostal space. A soft tissue retractor is placed.  Two 11 mm ports are placed through separate stab incisions inferiorly. The right pleural space is insufflated continuously with carbon dioxide gas through the posterior port during the remainder of the operation.  A pledgeted sutures placed through the dome of the right hemidiaphragm and retracted inferiorly to facilitate exposure.  A longitudinal incision is made in the pericardium 3 cm anterior to the phrenic nerve and silk traction sutures are placed on either side  of the incision for exposure.  The patient is placed in Trendelenburg position. The right internal jugular vein is cannulated with Seldinger technique and a guidewire advanced into the right atrium. The patient is heparinized systemically. The right internal jugular vein is cannulated with a 14 [place] pediatric femoral venous cannula. Pursestring sutures are placed on the anterior surface of the right common femoral vein and right common femoral artery. The right common femoral vein is cannulated with the Seldinger technique and a guidewire is advanced under transesophageal echocardiogram guidance through the right atrium. The femoral vein is cannulated with a long 22 French femoral venous cannula. The right common femoral artery is cannulated with Seldinger technique and a flexible guidewire is advanced until it can be appreciated intraluminally in the descending thoracic aorta on transesophageal echocardiogram. The femoral artery is cannulated with an 16 French femoral arterial cannula.  Adequate heparinization is verified.      The entire pre-bypass portion of the operation was notable for stable hemodynamics.  Cardiopulmonary bypass was begun.  Vacuum assist venous drainage is utilized. The incision in the pericardium is extended in both directions. Venous drainage and exposure are notably excellent. A retrograde cardioplegia cannula is placed through the right atrium into the coronary sinus using transesophageal echocardiogram guidance.  An antegrade cardioplegia cannula is placed in the ascending aorta.    The Atricure cryothermy system is utilized for all cryothermy ablation lesions for the Cox cryomaze procedure.  The right atrial lateral wall lesions are placed including a longitudinal line along the lateral wall from the superior vena cava to the inferior vena cava.  A second lesion is then placed extending from the midportion of the first lesion in an anterior and inferior direction to reach the  acute margin of the heart.  The patient is  cooled to 28C systemic temperature.  The aortic cross clamp is applied and cold blood cardioplegia is delivered initially in an antegrade fashion through the aortic root.  Supplemental cardioplegia is given retrograde through the coronary sinus catheter. The initial cardioplegic arrest is rapid with early diastolic arrest.  Repeat doses of cardioplegia are administered intermittently every 20 to 30 minutes throughout the entire cross clamp portion of the operation through the aortic root and through the coronary sinus catheter in order to maintain completely flat electrocardiogram.  Myocardial protection was felt to be excellent.  A left atriotomy incision was performed through the interatrial groove and extended partially across the back wall of the left atrium after opening the oblique sinus inferiorly.  The mitral valve and the floor of the left atrium are exposed using a retractor blade placed into the left atrium and attached to a straight bar placed through the 3rd intercostal space just to the right side of the sternum.  Exposure was notably excellent.  The mitral valve was inspected and notable for Both fibroelastic and calcific degenerative disease. The middle scallop of the posterior leaflet was flail with ruptured primary cords. There is severe calcification extending around the posterior annulus and extending to both the anterior and posterior leaflet. There is a large area of calcification the anterior leaflet at A3 in the middle of the leaflet and extending to the annulus which severely restricts anterior leaflet motion. There are severe areas of calcification in the posterior leaflet at P3 and at P1 which also restricts leaflet motion. An attempt at valve repair is felt to be unwise under the circumstances.  The left atrial lesion set of the Cox cryomaze procedure is now performed using 3 minute duration for all cryothermy lesions.  Initially a lesion  is placed along the endocardial surface of the left atrium from the caudad apex of the atriotomy incision across the posterior wall of the left atrium onto the posterior mitral annulus.  A mirror image lesion along the epicardial surface is then performed with the probe posterior to the left atrium, crossing over the coronary sinus.  Two lesions are then performed to create a box isolating all of the pulmonary veins from the remainder of the left atrium.  The first lesion is placed from the cephalad apex of the atriotomy incision across the dome of the left atrium to just anterior to the left sided pulmonary veins.  The second lesion completes the box from the caudad apex of the atriotomy incision across the back wall of the left atrium to connect with the previous lesion just anterior to the left sided pulmonary veins.    The anterior leaflet of the mitral valve was excised sharply, leaving a small rim of the free margin and the associated primary chords.  The posterior leaflet split in the midline.  The mitral annulus was sized to accept a 27 mm prosthesis.  The left ventricle was irrigated with copious cold saline solution.  Mitral valve replacement was performed using interrupted horizontal mattress 2-0 Ethibond pledgeted sutures with pledgets in the supraannular position.  The remaining portions of the anterior leaflet were incorporated into the suture line laterally, thereby preserving chords to both the anterior and posterior leaflet.  An [facility] Mitral bovine pericardial bioprosthetic tissue valve (size 27 mm, model # 7300TFX, serial # [id number]) was implanted uneventfully. The valve seated appropriately with care to position the commissure posts away from the left ventricular outflow tract.  The valve is tested with saline  and appears to be perfectly competent with no sign of perivalvular leak.  Rewarming is begun.  The atriotomy was closed using a 2-layer closure of running 3-0 Prolene suture  after placing a sump drain across the mitral valve to serve as a left ventricular vent.  One final dose of warm retrograde "hot shot" cardioplegia was administered retrograde through the coronary sinus catheter while all air was evacuated through the aortic root.  The aortic cross clamp was removed after a total cross clamp time of 131 minutes.  The retrograde cardioplegia cannula was removed.  One final cryothermy lesion was performed to complete the right atrial lesion set of the Cox cryomaze procedure by placing the cryothermy probe through the small incision in the right atrium where the retrograde cannula was removed and placing it along the endocardial surface to cross the acute margin to the tricuspid annulus at the 2 o'clock position.  This completes the entire lesion set with exception of the 10 o'clock lesion which is intentionally omitted.  The small right atriotomy was closed.  Epicardial pacing wires are fixed to the inferior wall of the right ventricule and to the right atrial appendage. The patient is rewarmed to 37C temperature. The left ventricular vent is removed.  The patient is ventilated and flow volumes turndown while the mitral valve function is inspected using transesophageal echocardiogram. The valve appears to be functioning normally with no perivalvular leak. The antegrade cardioplegia cannula is now removed. The patient is weaned and disconnected from cardiopulmonary bypass.  The patient's rhythm at separation from bypass was AV paced.  The patient was weaned from bypass without any inotropic support. Total cardiopulmonary bypass time for the operation was 191 minutes.  Followup transesophageal echocardiogram performed after separation from bypass revealed a well-seated bioprosthetic tissue valve in the mitral position that was functioning normally. There was no residual leak.  Left ventricular function was unchanged from preoperatively.  The femoral arterial and venous cannulae  were removed uneventfully. There was a palpable pulse in the distal right common femoral artery after removal of the cannula. Protamine was administered to reverse the anticoagulation. The right internal jugular cannula was removed and manual pressure held on the neck for 15 minutes.  Single lung ventilation was begun. The atriotomy closure was inspected for hemostasis. The pericardial sac was drained using a 28 French Bard drain placed through the anterior port incision.  The pericardium was closed using a patch of core matrix bovine submucosal tissue patch. The right pleural space is irrigated with saline solution and inspected for hemostasis. The right pleural space was drained using a 28 French Bard drain placed through the posterior port incision. The miniature thoracotomy incision was closed in multiple layers in routine fashion. The right groin incision was inspected for hemostasis and closed in multiple layers in routine fashion.  The patient tolerated the procedure well.  The patient was reintubated using a single lumen endotracheal tube and subsequently transported to the surgical intensive care unit in stable condition. There were no intraoperative complications. All sponge instrument and needle counts are verified correct at completion of the operation.  The post-bypass portion of the operation was notable for stable rhythm and hemodynamics.     Salvatore Decent. Cornelius Moras MD

## 2012-05-18 NOTE — CV Procedure (Signed)
Intraoperative Transesophageal Echocardiography Report:  Nancy Bush is an 78 year old female with severe mitral regurgitation, pulmonary hypertension, chronic atrial fibrillation and a history of congestive heart failure. She is now scheduled to undergo either mitral valve repair or replacement and Maze procedure by Dr. Tressie Stalker. Intraoperative transesophageal echocardiography was requested to evaluate the mitral valve and to assist with the procedure and to serve as a monitor for intraoperative volume status.  The patient was brought to the operating room at [facility] and general anesthesia was induced without difficulty. Following endotracheal intubation and orogastric suctioning, the transesophageal echocardiography probe was inserted into the esophagus without difficulty.  Impression: Pre-bypass findings:  1. Mitral valve: There was severe mitral regurgitation. This appeared due to a flail posterior leaflet involving the P1 and P2 regions with an anteriorly directed jet which tracted along the superior surface of the anterior leaflet and along the anterior wall of the left atrium. There was also severe mitral annular and valvular calcification noted. There was moderate to severe calcification involving the bases of the posterior and anterior leaflets. There were also 2 nodules of calcification involving the midportion of the anterior leaflet and one nodule in the P2 region of the posterior leaflet.  2. Aortic valve: The aortic valve was trileaflet the leaflets opened normally and there was trace aortic insufficiency. There was an no thickening or calcification of the leaflets noted.  3. Left ventricle: There was vigorous contractility of the left ventricle noted with no regional wall motion abnormalities. Ejection fraction was estimated 60-65%. The left ventricular end-diastolic diameter measured 4.62 cm at the cordall level in the transgastric long axis view. Left ventricular  wall thickness measured 0.75-0.85 mm at end diastole.  4. Right ventricle: Imaging of the right right ventricle was suboptimal do to shadowing from the calcification in the mitral annulus and  Poor probe contact. The right ventricular size appeared normal and there and there was normal appearing contractility the right ventricular free wall.  5. Tricuspid valve: The tricuspid valve leaflets appeared structurally normal. There was 2+ tricuspid insufficiency noted.  The tricuspid annulus measured 4.1 cm in the RV inflow outflow view and 3.5 cm in the 4 chamber view.  6. Right atrium: The right atrial cavity was markedly enlarged.  7. Intra-atrial septum: After careful interrogation with color Doppler and bubble studies: No patent foramen ovale could be appreciated  8. Left atrium: The left atrial cavity was markedly enlarged and measured 6.62 cm in the superior inferior dimension and 6.07 cm in the mediolateral dimension. There was no thrombus noted in the left atrial cavity or left atrial appendage.  9. Ascending aorta: The  ascending aorta was not dilated and there was a well-defined sinotubular ridge and aortic root without effacement. There was mild calcification noted in the wall of the ascending aorta in the region of the right sinus of Valsalva.  Post-bypass findings:  1. Mitral valve: There was a bioprosthetic valve in the mitral position. The leaflets open normally. There was a trace amount of mitral regurgitation centrally. Initially there were very faint perivalvular leaks noted in the medial and lateral aspects of the sewing ring in the 4 chamber view. These resolved over time following the administration of protamine. Continuous-wave Doppler of the mitral inflow revealed a mean gradient of 1 mm mercury.  2. Aortic valve: The aortic valve appeared unchanged from the pre-bypass study and was normal appearing. There was trace aortic insufficiency  3. Left ventricle: Examination of the  left ventricle  was difficult due to presumed poor probe contact: There was a dyssynchronous contractile pattern due to ventricular pacing. The estimated ejection fraction was 50-55% with no regional wall motion abnormalities noted.  4. Right ventricle: There appeared to be adequate contractility of the right ventricular free wall.  5. Tricuspid valve there was 1-2+ tricuspid insufficiency noted.  Kipp Brood, M.D.

## 2012-05-18 NOTE — Progress Notes (Signed)
ETT withdrawal 1.5-2 per Dr. Cornelius Moras and xray.

## 2012-05-18 NOTE — Anesthesia Preprocedure Evaluation (Addendum)
Anesthesia Evaluation  Patient identified by MRN, date of birth, ID band Patient awake    Reviewed: Allergy & Precautions, H&P , NPO status , Patient's Chart, lab work & pertinent test results  Airway Mallampati: II      Dental  (+) Teeth Intact and Dental Advisory Given   Pulmonary  breath sounds clear to auscultation        Cardiovascular Rhythm:Irregular Rate:Normal + Systolic murmurs    Neuro/Psych    GI/Hepatic   Endo/Other    Renal/GU      Musculoskeletal   Abdominal   Peds  Hematology   Anesthesia Other Findings   Reproductive/Obstetrics                           Anesthesia Physical Anesthesia Plan  ASA: III  Anesthesia Plan: General   Post-op Pain Management:    Induction: Intravenous  Airway Management Planned: Oral ETT  Additional Equipment: Arterial line, PA Cath, 3D TEE and CVP  Intra-op Plan:   Post-operative Plan: Post-operative intubation/ventilation  Informed Consent: I have reviewed the patients History and Physical, chart, labs and discussed the procedure including the risks, benefits and alternatives for the proposed anesthesia with the patient or authorized representative who has indicated his/her understanding and acceptance.   Dental advisory given  Plan Discussed with: CRNA and Surgeon  Anesthesia Plan Comments:         Anesthesia Quick Evaluation

## 2012-05-18 NOTE — Transfer of Care (Signed)
Immediate Anesthesia Transfer of Care Note  Patient: Nancy Bush  Procedure(s) Performed: Procedure(s) (LRB) with comments: MAZE (Right) MINIMALLY INVASIVE MITRAL VALVE (MV) REPLACEMENT (N/A)  Patient Location: SICU  Anesthesia Type: General  Level of Consciousness: Patient remains intubated per anesthesia plan  Airway & Oxygen Therapy: Patient remains intubated per anesthesia plan and Patient placed on Ventilator (see vital sign flow sheet for setting)  Post-op Assessment: Report given to PACU RN and Post -op Vital signs reviewed and stable  Post vital signs: Reviewed and stable  Complications: No apparent anesthesia complications

## 2012-05-18 NOTE — Anesthesia Postprocedure Evaluation (Signed)
  Anesthesia Post-op Note  Patient: Nancy Bush  Procedure(s) Performed: Procedure(s) (LRB) with comments: MAZE (Right) MINIMALLY INVASIVE MITRAL VALVE (MV) REPLACEMENT (N/A)  Patient Location: PACU  Anesthesia Type: General  Level of Consciousness: sedated and Patient remains intubated per anesthesia plan  Airway and Oxygen Therapy: Patient remains intubated per anesthesia plan and Patient placed on Ventilator (see vital sign flow sheet for setting)  Post-op Pain: none  Post-op Assessment: Post-op Vital signs reviewed and Patient's Cardiovascular Status Stable  Post-op Vital Signs: stable  Complications: No apparent anesthesia complications

## 2012-05-18 NOTE — Brief Op Note (Signed)
05/18/2012  2:43 PM  PATIENT:  Nancy Bush  78 y.o. female  PRE-OPERATIVE DIAGNOSIS:  MITRAL REGURGITATION, ATRIAL FIB  POST-OPERATIVE DIAGNOSIS:  MITRAL REGURGITATION, ATRIAL FIB  PROCEDURE:   MINIMALLY INVASIVE MITRAL VALVE REPLACEMENT (27mm Edwards Magna Mitral Pericardial Tissue Valve) MAZE PROCEDURE (complete biatrial lesion set with oversewing of LA appendage)  SURGEON:    Purcell Nails, MD  ASSISTANTS:  Doree Fudge, PA-C  ANESTHESIA:   Kipp Brood, MD  CROSSCLAMP TIME:   131'  CARDIOPULMONARY BYPASS TIME: 191'  FINDINGS:  Fibroelastic and calcific degenerative mitral valve disease with severe mitral regurgitation  Severe prolapse (type II dysfunction) and severe calcification (type IIIA dysfunction)  Normal LV systolic function  COMPLICATIONS: none  PATIENT DISPOSITION:   TO SICU IN STABLE CONDITION  [name] H 05/18/2012 2:45 PM

## 2012-05-18 NOTE — Preoperative (Signed)
Beta Blockers   Reason not to administer Beta Blockers:Took metoprolol 05/17/12 at 1445, within 24h.

## 2012-05-18 NOTE — Procedures (Signed)
Extubation Procedure Note  Patient Details:   Name: Nancy Bush DOB: [birth year] MRN: [id number]   Airway Documentation:   Patient extubated to 4 lpm nasal cannula.  VC 550 ml, NIF -25, able to hold head off bed 10 seconds.  Able to breathe around deflated cuff and vocalize post procedure.     Evaluation  O2 sats: stable throughout Complications: No apparent complications Patient did tolerate procedure well. Bilateral Breath Sounds: Clear   Yes  Nancy Bush, Aloha Gell 05/18/2012, 11:46 PM

## 2012-05-19 ENCOUNTER — Inpatient Hospital Stay (HOSPITAL_COMMUNITY): Payer: Medicare Other

## 2012-05-19 ENCOUNTER — Encounter (HOSPITAL_COMMUNITY): Payer: Self-pay | Admitting: Thoracic Surgery (Cardiothoracic Vascular Surgery)

## 2012-05-19 LAB — POCT I-STAT 3, ART BLOOD GAS (G3+)
Acid-base deficit: 4 mmol/L — ABNORMAL HIGH (ref 0.0–2.0)
Bicarbonate: 22.4 mEq/L (ref 20.0–24.0)
Bicarbonate: 22.2 mEq/L (ref 20.0–24.0)
O2 Saturation: 97 %
O2 Saturation: 93 %
TCO2: 24 mmol/L (ref 0–100)
TCO2: 24 mmol/L (ref 0–100)
pCO2 arterial: 41.3 mmHg (ref 35.0–45.0)
pH, Arterial: 7.341 — ABNORMAL LOW (ref 7.350–7.450)
pO2, Arterial: 97 mmHg (ref 80.0–100.0)
pO2, Arterial: 70 mmHg — ABNORMAL LOW (ref 80.0–100.0)

## 2012-05-19 LAB — GLUCOSE, CAPILLARY
Glucose-Capillary: 160 mg/dL — ABNORMAL HIGH (ref 70–99)
Glucose-Capillary: 150 mg/dL — ABNORMAL HIGH (ref 70–99)
Glucose-Capillary: 165 mg/dL — ABNORMAL HIGH (ref 70–99)
Glucose-Capillary: 81 mg/dL (ref 70–99)
Glucose-Capillary: 91 mg/dL (ref 70–99)
Glucose-Capillary: 160 mg/dL — ABNORMAL HIGH (ref 70–99)
Glucose-Capillary: 214 mg/dL — ABNORMAL HIGH (ref 70–99)

## 2012-05-19 LAB — PREPARE PLATELET PHERESIS

## 2012-05-19 LAB — PREPARE FRESH FROZEN PLASMA: Unit division: 0

## 2012-05-19 LAB — CBC
HCT: 29.6 % — ABNORMAL LOW (ref 36.0–46.0)
Hemoglobin: 9.9 g/dL — ABNORMAL LOW (ref 12.0–15.0)
Hemoglobin: 9.8 g/dL — ABNORMAL LOW (ref 12.0–15.0)
MCH: 29.7 pg (ref 26.0–34.0)
MCHC: 32.3 g/dL (ref 30.0–36.0)
MCV: 91.8 fL (ref 78.0–100.0)
RBC: 3.3 MIL/uL — ABNORMAL LOW (ref 3.87–5.11)
WBC: 16.8 10*3/uL — ABNORMAL HIGH (ref 4.0–10.5)
WBC: 19.8 10*3/uL — ABNORMAL HIGH (ref 4.0–10.5)

## 2012-05-19 LAB — BASIC METABOLIC PANEL
BUN: 9 mg/dL (ref 6–23)
Chloride: 114 mEq/L — ABNORMAL HIGH (ref 96–112)
GFR calc Af Amer: 77 mL/min — ABNORMAL LOW (ref >90)
Glucose, Bld: 100 mg/dL — ABNORMAL HIGH (ref 70–99)
Potassium: 3.8 mEq/L (ref 3.5–5.1)

## 2012-05-19 LAB — POCT I-STAT, CHEM 8
BUN: 9 mg/dL (ref 6–23)
Calcium, Ion: 1 mmol/L — ABNORMAL LOW (ref 1.13–1.30)
Hemoglobin: 9.9 g/dL — ABNORMAL LOW (ref 12.0–15.0)
TCO2: 18 mmol/L (ref 0–100)

## 2012-05-19 LAB — MAGNESIUM: Magnesium: 2.5 mg/dL (ref 1.5–2.5)

## 2012-05-19 MED ORDER — POTASSIUM CHLORIDE 10 MEQ/50ML IV SOLN
10.0000 meq | INTRAVENOUS | Status: AC
  Administered 2012-05-19 (×2): 10 meq via INTRAVENOUS
  Filled 2012-05-19: qty 100

## 2012-05-19 MED ORDER — INSULIN ASPART 100 UNIT/ML ~~LOC~~ SOLN
0.0000 [IU] | SUBCUTANEOUS | Status: DC
  Administered 2012-05-19: 4 [IU] via SUBCUTANEOUS
  Administered 2012-05-19: 8 [IU] via SUBCUTANEOUS
  Administered 2012-05-20 (×3): 2 [IU] via SUBCUTANEOUS

## 2012-05-19 MED ORDER — INSULIN ASPART 100 UNIT/ML ~~LOC~~ SOLN: 0.0000 [IU] | SUBCUTANEOUS | Status: DC

## 2012-05-19 MED ORDER — POTASSIUM CHLORIDE 10 MEQ/50ML IV SOLN
10.0000 meq | INTRAVENOUS | Status: AC
  Administered 2012-05-19 (×3): 10 meq via INTRAVENOUS
  Filled 2012-05-19: qty 100

## 2012-05-19 MED ORDER — INSULIN ASPART 100 UNIT/ML ~~LOC~~ SOLN: 0.0000 [IU] | SUBCUTANEOUS | Status: AC

## 2012-05-19 MED ORDER — FUROSEMIDE 10 MG/ML IJ SOLN
20.0000 mg | Freq: Four times a day (QID) | INTRAMUSCULAR | Status: AC
  Administered 2012-05-19 (×3): 20 mg via INTRAVENOUS
  Filled 2012-05-19: qty 2

## 2012-05-19 MED FILL — Potassium Chloride Inj 2 mEq/ML: INTRAVENOUS | Qty: 40 | Status: AC

## 2012-05-19 MED FILL — Magnesium Sulfate Inj 50%: INTRAMUSCULAR | Qty: 10 | Status: AC

## 2012-05-19 NOTE — Progress Notes (Signed)
UR Completed.  Vangie Bicker [phone] 05/19/2012

## 2012-05-19 NOTE — Progress Notes (Signed)
CARDIOTHORACIC SURGERY PROGRESS NOTE   R1 Day Post-Op Procedure(s) (LRB): MAZE (Right) MINIMALLY INVASIVE MITRAL VALVE (MV) REPLACEMENT (N/A)  Subjective: Looks very good.  Denies pain except for soreness in middle of chest present prior to surgery from recent CPR.  Mild nausea.  Objective: Vital signs: BP Readings from Last 1 Encounters:  05/19/12 102/52   Pulse Readings from Last 1 Encounters:  05/19/12 80   Resp Readings from Last 1 Encounters:  05/19/12 19   Temp Readings from Last 1 Encounters:  05/19/12 97.2 F (36.2 C) Core (Comment)    Hemodynamics: PAP: (26-49)/(13-30) 37/16 mmHg CO:  [1.6 L/min-2.6 L/min] 2.5 L/min CI:  [1.1 L/min/m2-1.8 L/min/m2] 1.7 L/min/m2  Physical Exam:  Rhythm:   Afib with slow rate, VVI paced  Breath sounds: Diminished at bases  Heart sounds:  RRR no murmur  Incisions:  Dressings dry  Abdomen:  soft  Extremities:  warm   Intake/Output from previous day: 10/15 0701 - 10/16 0700 In: 6689.4 [I.V.:3423.4; ZOXWR:6045; NG/GT:30; IV Piggyback:2052] Out: 6675 [Urine:5015; Emesis/NG output:300; Blood:700; Chest Tube:660] Intake/Output this shift: Total I/O In: 62.9 [I.V.:62.9] Out: 90 [Urine:60; Chest Tube:30]  Lab Results:  Basename 05/19/12 0355 05/18/12 2217 05/18/12 2210  WBC 16.8* -- 16.1*  HGB 9.9* 9.9* --  HCT 29.6* 29.0* --  PLT 76* -- 73*   BMET:  Basename 05/19/12 0355 05/18/12 2217  NA 145 146*  K 3.8 3.8  CL 114* 110  CO2 22 --  GLUCOSE 100* 140*  BUN 9 7  CREATININE 0.80 0.90  CALCIUM 7.4* --    CBG (last 3)   Basename 05/19/12 0714 05/19/12 0605 05/19/12 0336  GLUCAP 111* 91 91   ABG    Component Value Date/Time   PHART 7.324* 05/19/2012 0130   HCO3 22.2 05/19/2012 0130   TCO2 24 05/19/2012 0130   ACIDBASEDEF 4.0* 05/19/2012 0130   O2SAT 93.0 05/19/2012 0130   CXR: *RADIOLOGY REPORT*   Clinical Data: Status post minimally invasive mitral valve replacement and MAZE procedure.   PORTABLE  CHEST - 1 VIEW   Comparison: Chest x-ray 05/18/2012.   Findings: The patient has been extubated.  There is a right-sided chest tube which is unchanged in position.  An additional drainage catheter is seen projecting over the lower right hemithorax extending across the midline with tip injecting over the region of the left lower hemithorax.  The position of this catheter is uncertain. There is a left-sided internal jugular central venous catheter with tip terminating in the distal superior vena cava. Left internal jugular central venous Cordis through which a Swan Ganz catheter has been passed into the distal pulmonic trunk. Epicardial pacing wires remain in position.  There appears to be a stented bioprosthesis in the mitral valve position.   Lung volumes are low.  Bibasilar opacities favored to represent postoperative atelectasis with a superimposed small bilateral pleural effusions (left greater than right).  No definite pneumothorax.  No definite consolidative airspace disease.  Mild pulmonary venous congestion, without frank pulmonary edema.  Mild enlargement of the cardiopericardial silhouette is similar to the recent prior examinations.   IMPRESSION: 1.  Postoperative changes and support apparatus, as above. 2.  Low lung volumes with slight worsening of bibasilar subsegmental postoperative atelectasis and small bilateral pleural effusions following extubation.     Original Report Authenticated By: Florencia Reasons, M.D.     Assessment/Plan: S/P Procedure(s) (LRB): MAZE (Right) MINIMALLY INVASIVE MITRAL VALVE (MV) REPLACEMENT (N/A)  Doing well POD1 Expected post op  acute blood loss anemia, mild, stable Expected post op volume excess, mild Postop atelectasis Chronic persistent Afib, now s/p maze Baseline LBBB with bradycardia, transient complete heart block during right heart cath   Mobilize  Diuresis  D/C swan  Continue pacing for now - may need perm  pacer  Keep in SICU for now   [facility] H 05/19/2012 8:43 AM

## 2012-05-19 NOTE — Progress Notes (Signed)
TCTS BRIEF SICU PROGRESS NOTE  1 Day Post-Op  S/P Procedure(s) (LRB): MAZE (Right) MINIMALLY INVASIVE MITRAL VALVE (MV) REPLACEMENT (N/A)   Looks great.  Ambulated around SICU.  Reports feeling sleepy but otherwise no complaints. Nausea improved. V-paced - still very bradycardic underneath BP stable UOP adequate  Plan: Continue current plan  OWEN,CLARENCE H 05/19/2012 6:10 PM

## 2012-05-20 ENCOUNTER — Inpatient Hospital Stay (HOSPITAL_COMMUNITY): Payer: Medicare Other

## 2012-05-20 LAB — BASIC METABOLIC PANEL WITH GFR
BUN: 12 mg/dL (ref 6–23)
CO2: 22 meq/L (ref 19–32)
Calcium: 7.5 mg/dL — ABNORMAL LOW (ref 8.4–10.5)
Chloride: 109 meq/L (ref 96–112)
Creatinine, Ser: 1.02 mg/dL (ref 0.50–1.10)
GFR calc Af Amer: 57 mL/min — ABNORMAL LOW (ref >90)
GFR calc non Af Amer: 49 mL/min — ABNORMAL LOW (ref >90)
Glucose, Bld: 141 mg/dL — ABNORMAL HIGH (ref 70–99)
Potassium: 4.2 meq/L (ref 3.5–5.1)
Sodium: 142 meq/L (ref 135–145)

## 2012-05-20 LAB — GLUCOSE, CAPILLARY
Glucose-Capillary: 131 mg/dL — ABNORMAL HIGH (ref 70–99)
Glucose-Capillary: 125 mg/dL — ABNORMAL HIGH (ref 70–99)
Glucose-Capillary: 179 mg/dL — ABNORMAL HIGH (ref 70–99)
Glucose-Capillary: 199 mg/dL — ABNORMAL HIGH (ref 70–99)

## 2012-05-20 LAB — CBC
MCH: 30.4 pg (ref 26.0–34.0)
MCHC: 33.2 g/dL (ref 30.0–36.0)
Platelets: 88 10*3/uL — ABNORMAL LOW (ref 150–400)

## 2012-05-20 MED ORDER — SODIUM CHLORIDE 0.9 % IJ SOLN
3.0000 mL | INTRAMUSCULAR | Status: DC | PRN
  Administered 2012-05-21: 10:00:00 via INTRAVENOUS

## 2012-05-20 MED ORDER — MORPHINE SULFATE 2 MG/ML IJ SOLN
1.0000 mg | INTRAMUSCULAR | Status: DC | PRN
  Administered 2012-05-22 – 2012-05-23 (×6): 1 mg via INTRAVENOUS
  Filled 2012-05-20 (×3): qty 1

## 2012-05-20 MED ORDER — INSULIN ASPART 100 UNIT/ML ~~LOC~~ SOLN
0.0000 [IU] | Freq: Three times a day (TID) | SUBCUTANEOUS | Status: DC
  Administered 2012-05-20: 3 [IU] via SUBCUTANEOUS
  Administered 2012-05-21 – 2012-05-22 (×3): 2 [IU] via SUBCUTANEOUS
  Administered 2012-05-22: 3 [IU] via SUBCUTANEOUS
  Administered 2012-05-23 (×2): 2 [IU] via SUBCUTANEOUS
  Administered 2012-05-23 – 2012-05-24 (×2): 3 [IU] via SUBCUTANEOUS
  Administered 2012-05-24: 2 [IU] via SUBCUTANEOUS

## 2012-05-20 MED ORDER — LEVALBUTEROL HCL 0.63 MG/3ML IN NEBU
0.6300 mg | INHALATION_SOLUTION | Freq: Three times a day (TID) | RESPIRATORY_TRACT | Status: DC
  Administered 2012-05-20 – 2012-05-24 (×11): 0.63 mg via RESPIRATORY_TRACT
  Filled 2012-05-20 (×15): qty 3

## 2012-05-20 MED ORDER — FOLIC ACID 1 MG PO TABS
2.0000 mg | ORAL_TABLET | Freq: Every day | ORAL | Status: DC
  Administered 2012-05-20 – 2012-05-28 (×7): 2 mg via ORAL
  Filled 2012-05-20 (×9): qty 2

## 2012-05-20 MED ORDER — MOVING RIGHT ALONG BOOK
Freq: Once | Status: AC
  Administered 2012-05-20: 08:00:00
  Filled 2012-05-20: qty 1

## 2012-05-20 MED ORDER — SODIUM CHLORIDE 0.9 % IJ SOLN
3.0000 mL | Freq: Two times a day (BID) | INTRAMUSCULAR | Status: DC
  Administered 2012-05-20: 10 mL via INTRAVENOUS
  Administered 2012-05-20: 3 mL via INTRAVENOUS
  Administered 2012-05-21: 03:00:00 via INTRAVENOUS

## 2012-05-20 MED ORDER — TRAMADOL HCL 50 MG PO TABS
50.0000 mg | ORAL_TABLET | ORAL | Status: DC | PRN
  Administered 2012-05-20: 50 mg via ORAL
  Administered 2012-05-20 – 2012-05-23 (×4): 100 mg via ORAL
  Filled 2012-05-20: qty 1
  Filled 2012-05-20 (×2): qty 2
  Filled 2012-05-20: qty 1
  Filled 2012-05-20 (×2): qty 2

## 2012-05-20 MED ORDER — LEVALBUTEROL HCL 0.63 MG/3ML IN NEBU
0.6300 mg | INHALATION_SOLUTION | RESPIRATORY_TRACT | Status: DC | PRN
  Filled 2012-05-20: qty 3

## 2012-05-20 MED ORDER — FUROSEMIDE 10 MG/ML IJ SOLN
10.0000 mg/h | INTRAVENOUS | Status: DC
  Administered 2012-05-20: 10 mg/h via INTRAVENOUS
  Filled 2012-05-20 (×4): qty 25

## 2012-05-20 MED ORDER — SODIUM CHLORIDE 0.9 % IV SOLN
250.0000 mL | INTRAVENOUS | Status: DC | PRN
  Administered 2012-05-20: 250 mL via INTRAVENOUS

## 2012-05-20 MED ORDER — WARFARIN - PHYSICIAN DOSING INPATIENT: Freq: Every day | Status: DC

## 2012-05-20 MED ORDER — WARFARIN SODIUM 1 MG PO TABS
1.0000 mg | ORAL_TABLET | Freq: Every day | ORAL | Status: DC
  Administered 2012-05-20 – 2012-05-21 (×2): 1 mg via ORAL
  Filled 2012-05-20 (×3): qty 1

## 2012-05-20 MED ORDER — PREDNISONE 10 MG PO TABS
10.0000 mg | ORAL_TABLET | Freq: Every day | ORAL | Status: AC
  Administered 2012-05-20 – 2012-05-22 (×3): 10 mg via ORAL
  Filled 2012-05-20 (×4): qty 1

## 2012-05-20 MED FILL — Sodium Bicarbonate IV Soln 8.4%: INTRAVENOUS | Qty: 50 | Status: AC

## 2012-05-20 MED FILL — Sodium Chloride IV Soln 0.9%: INTRAVENOUS | Qty: 1000 | Status: AC

## 2012-05-20 MED FILL — Mannitol IV Soln 20%: INTRAVENOUS | Qty: 500 | Status: AC

## 2012-05-20 MED FILL — Sodium Chloride Irrigation Soln 0.9%: Qty: 3000 | Status: AC

## 2012-05-20 MED FILL — Lidocaine HCl IV Inj 20 MG/ML: INTRAVENOUS | Qty: 5 | Status: AC

## 2012-05-20 MED FILL — Heparin Sodium (Porcine) Inj 1000 Unit/ML: INTRAMUSCULAR | Qty: 30 | Status: AC

## 2012-05-20 MED FILL — Electrolyte-R (PH 7.4) Solution: INTRAVENOUS | Qty: 5000 | Status: AC

## 2012-05-20 NOTE — Progress Notes (Signed)
RT assesment completed.  Pt states she takes breathing txs at home PRN.  I ordered Xopenex nebs TID and PRN per RT Protocol.  BIL bs are rhonchus.  I ordered a flutter valve for her as well per RT protocol.  Pt encouraged to notify RN/ RT of any concerns.

## 2012-05-20 NOTE — Evaluation (Signed)
Physical Therapy Evaluation Patient Details Name: Nancy Bush MRN: [id number] DOB: 1926-07-22 Today's Date: 05/20/2012 Time: 7829-5621 PT Time Calculation (min): 35 min  PT Assessment / Plan / Recommendation Clinical Impression  Pt with minimally invasive MVR.  Expect pt will make good progress with mobility and be able to return home with sister.    PT Assessment  Patient needs continued PT services    Follow Up Recommendations  Home health PT    Does the patient have the potential to tolerate intense rehabilitation      Barriers to Discharge        Equipment Recommendations  None recommended by PT    Recommendations for Other Services     Frequency Min 3X/week    Precautions / Restrictions Precautions Precautions: Fall   Pertinent Vitals/Pain VSS      Mobility  Bed Mobility Bed Mobility: Supine to Sit;Sitting - Scoot to Edge of Bed Supine to Sit: 4: Min assist;HOB elevated Sitting - Scoot to [place] of Bed: 4: Min assist Details for Bed Mobility Assistance: Assist to bring trunk up and hips to EOB Transfers Transfers: Sit to Stand;Stand to Sit Sit to Stand: 4: Min assist;With upper extremity assist;From bed Stand to Sit: 4: Min assist;With upper extremity assist;To chair/3-in-1 Details for Transfer Assistance: Assist to bring hips up. Ambulation/Gait Ambulation/Gait Assistance: 4: Min assist Ambulation Distance (Feet): 200 Feet Assistive device: Other (Comment) (pushing w/c) Gait Pattern: Step-through pattern;Decreased stride length;Narrow base of support    Shoulder Instructions     Exercises     PT Diagnosis: Difficulty walking;Generalized weakness  PT Problem List: Decreased strength;Decreased activity tolerance;Decreased balance;Decreased mobility;Decreased knowledge of precautions;Decreased knowledge of use of DME PT Treatment Interventions: DME instruction;Gait training;Stair training;Functional mobility training;Patient/family  education;Therapeutic activities;Therapeutic exercise;Balance training   PT Goals Acute Rehab PT Goals PT Goal Formulation: With patient Time For Goal Achievement: 05/27/12 Potential to Achieve Goals: Good Pt will go Supine/Side to Sit: with supervision PT Goal: Supine/Side to Sit - Progress: Goal set today Pt will go Sit to Supine/Side: with supervision PT Goal: Sit to Supine/Side - Progress: Goal set today Pt will go Sit to Stand: with supervision PT Goal: Sit to Stand - Progress: Goal set today Pt will go Stand to Sit: with supervision PT Goal: Stand to Sit - Progress: Goal set today Pt will Ambulate: 51 - 150 feet;with supervision;with least restrictive assistive device PT Goal: Ambulate - Progress: Goal set today Pt will Go Up / Down Stairs: 1-2 stairs;with min assist;with least restrictive assistive device PT Goal: Up/Down Stairs - Progress: Goal set today  Visit Information  Last PT Received On: 05/20/12 Assistance Needed: +1    Subjective Data  Subjective: "I'm sleepy." Patient Stated Goal: Walkiing around   Prior Functioning  Home Living Lives With: Alone Available Help at Discharge: Family (will live with sister) Type of Home: House Home Access: Stairs to enter Secretary/administrator of Steps: 2 Entrance Stairs-Rails: None Home Layout: One level Firefighter: Standard Home Adaptive Equipment: Walker - rolling Prior Function Level of Independence: Independent Able to Take Stairs?: Yes Driving: Yes Vocation: Retired Musician: No difficulties Dominant Hand: Right    Cognition  Overall Cognitive Status: Appears within functional limits for tasks assessed/performed Arousal/Alertness: Awake/alert Orientation Level: Appears intact for tasks assessed Behavior During Session: [facility] for tasks performed    Extremity/Trunk Assessment Right Lower Extremity Assessment RLE ROM/Strength/Tone: Deficits RLE ROM/Strength/Tone Deficits: grossly  4/5 Left Lower Extremity Assessment LLE ROM/Strength/Tone: Deficits LLE ROM/Strength/Tone Deficits: grossly  4/5   Balance Static Standing Balance Static Standing - Balance Support: Right upper extremity supported Static Standing - Level of Assistance: 5: Stand by assistance  End of Session PT - End of Session Activity Tolerance: Patient tolerated treatment well Patient left: in chair;with call bell/phone within reach Nurse Communication: Mobility status  GP     [facility] 05/20/2012, 2:53 PM  [facility] PT [phone]

## 2012-05-20 NOTE — Progress Notes (Signed)
Anesthesiology Follow-up:  Alert, neuro intact, appears comfortable off oxygen. Ambulating with assistance.  VS T- 36.4 BP 112/50 HR 72 (AV Sequential Pacing) RR 16 O2 Sat 95% on RA  K-4.2 Cr-1.02 glucose 141  H/H 10.2/30.7 Plts: 88,000 WBC 22,100  Extubated 8 hours post-op  2 Days S?P MVR and MAZE via mini thoracotomy.  Stable post-op course  Kipp Brood, MD

## 2012-05-20 NOTE — Addendum Note (Signed)
Addendum  created 05/20/12 1142 by Monaca Wadas, MD   Modules edited:Notes Section    

## 2012-05-20 NOTE — Progress Notes (Signed)
2 Days Post-Op Procedure(s) (LRB): MAZE (Right) MINIMALLY INVASIVE MITRAL VALVE (MV) REPLACEMENT (N/A) Subjective: Improved urine output this pm with lasix AV paced-jx at 48/min  Objective: Vital signs in last 24 hours: Temp:  [97.2 F (36.2 C)-97.8 F (36.6 C)] 97.2 F (36.2 C) (10/17 1646) Pulse Rate:  [70-80] 70  (10/17 1600) Cardiac Rhythm:  [-] A-V Sequential paced (10/17 1600) Resp:  [10-20] 13  (10/17 1600) BP: (92-129)/(50-77) 125/64 mmHg (10/17 1600) SpO2:  [95 %-100 %] 96 % (10/17 1600) Arterial Line BP: (105-135)/(41-53) 105/46 mmHg (10/17 0600) Weight:  [125 lb 3.5 oz (56.8 kg)] 125 lb 3.5 oz (56.8 kg) (10/17 0500)  Hemodynamic parameters for last 24 hours:    Intake/Output from previous day: 10/16 0701 - 10/17 0700 In: 1517.3 [P.O.:570; I.V.:589.3; IV Piggyback:358] Out: 1670 [Urine:1070; Chest Tube:600] Intake/Output this shift: Total I/O In: 350.7 [P.O.:200; I.V.:150.7] Out: 685 [Urine:535; Chest Tube:150]    Lab Results:  [facility] 05/20/12 0340 05/19/12 1647  WBC 22.1* 19.8*  HGB 10.2* 9.8*  HCT 30.7* 30.3*  PLT 88* 74*   BMET:  Basename 05/20/12 0340 05/19/12 1647 05/19/12 1644 05/19/12 0355  NA 142 -- 146* --  K 4.2 -- 3.7 --  CL 109 -- 110 --  CO2 22 -- -- 22  GLUCOSE 141* -- 210* --  BUN 12 -- 9 --  CREATININE 1.02 0.87 -- --  CALCIUM 7.5* -- -- 7.4*    PT/INR:  Basename 05/18/12 1512  LABPROT 21.7*  INR 1.98*   ABG    Component Value Date/Time   PHART 7.324* 05/19/2012 0130   HCO3 22.2 05/19/2012 0130   TCO2 18 05/19/2012 1644   ACIDBASEDEF 4.0* 05/19/2012 0130   O2SAT 93.0 05/19/2012 0130   CBG (last 3)   Basename 05/20/12 1643 05/20/12 1138 05/20/12 0728  GLUCAP 179* 227* 125*    Assessment/Plan: S/P Procedure(s) (LRB): MAZE (Right) MINIMALLY INVASIVE MITRAL VALVE (MV) REPLACEMENT (N/A) Patient examined and record reviewed.Hemodynamics stable,labs satisfactory.Patient had stable day.Continue current care. VAN TRIGT  III,PETER 05/20/2012      LOS: 2 days    VAN TRIGT III,PETER 05/20/2012

## 2012-05-20 NOTE — Progress Notes (Addendum)
   CARDIOTHORACIC SURGERY PROGRESS NOTE   R2 Days Post-Op Procedure(s) (LRB): MAZE (Right) MINIMALLY INVASIVE MITRAL VALVE (MV) REPLACEMENT (N/A)  Subjective: Feels tired and a bit more SOB this am.  No pain.  + cough  Objective: Vital signs: BP Readings from Last 1 Encounters:  05/20/12 112/53   Pulse Readings from Last 1 Encounters:  05/20/12 70   Resp Readings from Last 1 Encounters:  05/20/12 12   Temp Readings from Last 1 Encounters:  05/20/12 97.6 F (36.4 C) Oral    Hemodynamics: PAP: (39)/(17) 39/17 mmHg  Physical Exam:  Rhythm:   Aflutter w/ HR 50, stable V-pacing  Breath sounds: Scattered coarse rhonchi  Heart sounds:  RRR no murmur  Incisions:  Dressings dry  Abdomen:  Soft, non tender  Extremities:  Warm, well perfused   Intake/Output from previous day: 10/16 0701 - 10/17 0700 In: 1517.3 [P.O.:570; I.V.:589.3; IV Piggyback:358] Out: 1670 [Urine:1070; Chest Tube:600] Intake/Output this shift: Total I/O In: -  Out: 40 [Urine:10; Chest Tube:30]  Lab Results:  [facility] 05/20/12 0340 05/19/12 1647  WBC 22.1* 19.8*  HGB 10.2* 9.8*  HCT 30.7* 30.3*  PLT 88* 74*   BMET:  Basename 05/20/12 0340 05/19/12 1647 05/19/12 1644 05/19/12 0355  NA 142 -- 146* --  K 4.2 -- 3.7 --  CL 109 -- 110 --  CO2 22 -- -- 22  GLUCOSE 141* -- 210* --  BUN 12 -- 9 --  CREATININE 1.02 0.87 -- --  CALCIUM 7.5* -- -- 7.4*    CBG (last 3)   Basename 05/20/12 0728 05/20/12 0335 05/19/12 2346  GLUCAP 125* 131* 154*   ABG    Component Value Date/Time   PHART 7.324* 05/19/2012 0130   HCO3 22.2 05/19/2012 0130   TCO2 18 05/19/2012 1644   ACIDBASEDEF 4.0* 05/19/2012 0130   O2SAT 93.0 05/19/2012 0130   CXR: Mild bibasilar atelectasis, R>L  Assessment/Plan: S/P Procedure(s) (LRB): MAZE (Right) MINIMALLY INVASIVE MITRAL VALVE (MV) REPLACEMENT (N/A)  Overall stable POD2 Expected post op acute blood loss anemia, mild, stable Expected post op volume excess,  moderate, minimal diuresis yesterday Post-op leukocytosis, received stress-dose steroids Postop atelectasis Baseline frail, elderly patient   Start lasix drip  Continue V-pacing for now  Will discuss w/ EP team - ? Consider perm pacer with DCCV vs further observation  Mobilize  Keep chest tubes and foley for now  Keep in SICU today  Restart coumadin slowly   Nancy Bush H 05/20/2012 8:16 AM

## 2012-05-21 ENCOUNTER — Encounter (HOSPITAL_COMMUNITY)
Admission: RE | Disposition: A | Discharge status: Skilled Nursing Facility | Payer: Self-pay | Source: Ambulatory Visit | Attending: Thoracic Surgery (Cardiothoracic Vascular Surgery)

## 2012-05-21 ENCOUNTER — Inpatient Hospital Stay (HOSPITAL_COMMUNITY): Payer: Medicare Other

## 2012-05-21 ENCOUNTER — Encounter: Payer: Self-pay | Admitting: Thoracic Surgery (Cardiothoracic Vascular Surgery)

## 2012-05-21 DIAGNOSIS — I059 Rheumatic mitral valve disease, unspecified: Principal | ICD-10-CM

## 2012-05-21 DIAGNOSIS — I498 Other specified cardiac arrhythmias: Secondary | ICD-10-CM

## 2012-05-21 DIAGNOSIS — R001 Bradycardia, unspecified: Secondary | ICD-10-CM | POA: Diagnosis not present

## 2012-05-21 HISTORY — PX: PERMANENT PACEMAKER INSERTION: SHX5480

## 2012-05-21 LAB — BASIC METABOLIC PANEL
BUN: 15 mg/dL (ref 6–23)
Creatinine, Ser: 1.04 mg/dL (ref 0.50–1.10)
GFR calc Af Amer: 56 mL/min — ABNORMAL LOW (ref >90)
GFR calc non Af Amer: 48 mL/min — ABNORMAL LOW (ref >90)
Potassium: 3.1 mEq/L — ABNORMAL LOW (ref 3.5–5.1)

## 2012-05-21 LAB — GLUCOSE, CAPILLARY: Glucose-Capillary: 112 mg/dL — ABNORMAL HIGH (ref 70–99)

## 2012-05-21 LAB — CBC
HCT: 29.2 % — ABNORMAL LOW (ref 36.0–46.0)
MCHC: 32.5 g/dL (ref 30.0–36.0)
RDW: 18.2 % — ABNORMAL HIGH (ref 11.5–15.5)

## 2012-05-21 SURGERY — PERMANENT PACEMAKER INSERTION
Anesthesia: LOCAL

## 2012-05-21 MED ORDER — POTASSIUM CHLORIDE 10 MEQ/50ML IV SOLN
10.0000 meq | INTRAVENOUS | Status: AC | PRN
  Administered 2012-05-21 (×3): 10 meq via INTRAVENOUS
  Filled 2012-05-21: qty 100

## 2012-05-21 MED ORDER — HEPARIN (PORCINE) IN NACL 2-0.9 UNIT/ML-% IJ SOLN
INTRAMUSCULAR | Status: AC
  Filled 2012-05-21: qty 500

## 2012-05-21 MED ORDER — ONDANSETRON HCL 4 MG/2ML IJ SOLN: 4.0000 mg | Freq: Four times a day (QID) | INTRAMUSCULAR | Status: DC | PRN

## 2012-05-21 MED ORDER — LIDOCAINE HCL (PF) 1 % IJ SOLN
INTRAMUSCULAR | Status: AC
  Filled 2012-05-21: qty 60

## 2012-05-21 MED ORDER — MIDAZOLAM HCL 2 MG/2ML IJ SOLN
INTRAMUSCULAR | Status: AC
  Filled 2012-05-21: qty 2

## 2012-05-21 MED ORDER — POTASSIUM CHLORIDE 10 MEQ/50ML IV SOLN
10.0000 meq | INTRAVENOUS | Status: AC
  Administered 2012-05-21: 10 meq via INTRAVENOUS
  Filled 2012-05-21: qty 150

## 2012-05-21 MED ORDER — PREDNISONE 5 MG PO TABS
5.0000 mg | ORAL_TABLET | Freq: Every day | ORAL | Status: DC
  Administered 2012-05-23 – 2012-05-28 (×6): 5 mg via ORAL
  Filled 2012-05-21 (×8): qty 1

## 2012-05-21 MED ORDER — POTASSIUM CHLORIDE 10 MEQ/50ML IV SOLN
INTRAVENOUS | Status: AC
Start: 2012-05-21 — End: 2012-05-21
  Administered 2012-05-21: 10 meq via INTRAVENOUS
  Filled 2012-05-21: qty 50

## 2012-05-21 MED ORDER — ENOXAPARIN SODIUM 30 MG/0.3ML ~~LOC~~ SOLN
30.0000 mg | SUBCUTANEOUS | Status: DC
  Filled 2012-05-21 (×2): qty 0.3

## 2012-05-21 MED ORDER — FENTANYL CITRATE 0.05 MG/ML IJ SOLN
INTRAMUSCULAR | Status: AC
  Filled 2012-05-21: qty 2

## 2012-05-21 MED ORDER — CHLORHEXIDINE GLUCONATE 4 % EX LIQD
60.0000 mL | Freq: Once | CUTANEOUS | Status: DC
  Filled 2012-05-21: qty 60

## 2012-05-21 MED ORDER — CHLORHEXIDINE GLUCONATE 4 % EX LIQD
60.0000 mL | Freq: Once | CUTANEOUS | Status: DC
  Administered 2012-05-21: 4 via TOPICAL
  Filled 2012-05-21: qty 60

## 2012-05-21 MED ORDER — FUROSEMIDE 10 MG/ML IJ SOLN
10.0000 mg/h | INTRAVENOUS | Status: DC
  Administered 2012-05-21: 10 mg/h via INTRAVENOUS
  Filled 2012-05-21 (×2): qty 25

## 2012-05-21 MED ORDER — VANCOMYCIN HCL IN DEXTROSE 1-5 GM/200ML-% IV SOLN
1000.0000 mg | Freq: Two times a day (BID) | INTRAVENOUS | Status: AC
  Administered 2012-05-21: 1000 mg via INTRAVENOUS
  Filled 2012-05-21: qty 200

## 2012-05-21 MED ORDER — SODIUM CHLORIDE 0.9 % IR SOLN
80.0000 mg | Status: DC
  Filled 2012-05-21: qty 2

## 2012-05-21 MED ORDER — ACETAMINOPHEN 325 MG PO TABS: 325.0000 mg | ORAL_TABLET | ORAL | Status: DC | PRN

## 2012-05-21 MED ORDER — VANCOMYCIN HCL IN DEXTROSE 1-5 GM/200ML-% IV SOLN
1000.0000 mg | INTRAVENOUS | Status: DC
  Filled 2012-05-21: qty 200

## 2012-05-21 NOTE — Progress Notes (Signed)
Patient ID: Nancy Bush, female   DOB: [birth year], 78 y.o.   MRN: [id number]   POD # 3 MVR(tissue) and maze S/p Pacemaker  Some nausea post procedure  BP 102/55  Pulse 90  Temp 97.5 F (36.4 C) (Oral)  Resp 15  Wt 125 lb 3.5 oz (56.8 kg)  SpO2 98%   Intake/Output Summary (Last 24 hours) at 05/21/12 1709 Last data filed at 05/21/12 1300  Gross per 24 hour  Intake    876 ml  Output   2475 ml  Net  -1599 ml    Doing well  Hopefully to 2000 tomorrow

## 2012-05-21 NOTE — Care Management Note (Signed)
    Page 1 of 2   05/28/2012     2:25:09 PM   CARE MANAGEMENT NOTE 05/28/2012  Patient:  Nancy Bush, Nancy Bush   Account Number:  [id number]  Date Initiated:  05/19/2012  Documentation initiated by:  [facility]  Subjective/Objective Assessment:   post op MVR and Maze     Action/Plan:   PT ORIGINNALLY HOPED TO DC HOME WITH SISTER.  PHYSICAL THERAPIST RECOMMENDING SNF PLACEMENT FOR SHORT TERM REHAB. CSW TO FOLLOW UP.   Anticipated DC Date:  05/27/2012   Anticipated DC Plan:  SKILLED NURSING FACILITY  In-house referral  Clinical Social Worker      DC Planning Services  CM consult      Choice offered to / List presented to:             Status of service:  Completed, signed off Medicare Important Message given?   (If response is "NO", the following Medicare IM given date fields will be blank) Date Medicare IM given:   Date Additional Medicare IM given:    Discharge Disposition:  SKILLED NURSING FACILITY  Per UR Regulation:  Reviewed for med. necessity/level of care/duration of stay  If discussed at Long Length of Stay Meetings, dates discussed:   05/27/2012    Comments:  Contact:  Pegram,Hilda Sister [phone]                 McGee,Carolyn Niece [phone]  05/28/12 Evelyna Folker, RN,BSN [phone] DC TO SNF TODAY, PER CSW ARRANGEMENTS.  05/25/12 Camay Pedigo,RN,BSN [phone] PT AGREEABLE TO ST-SNF PLACEMENT FOR REHAB, PER PHYSICAL THERAPIST'S RECOMMENDATIONS.  CSW TO FOLLOW TO FACILITATE DC TO SNF WHEN MEDICALLY STABLE.  05-21-12 11:20am Avie Arenas, RNBSN [phone] For permanent pacemaker today - fluid overload on lasix drip.  plan to discharge home with sister and niece. Walking well.  No needs assessed at this time.  CM will continue to follow.

## 2012-05-21 NOTE — Progress Notes (Signed)
   CARDIOTHORACIC SURGERY PROGRESS NOTE   R3 Days Post-Op Procedure(s) (LRB): MAZE (Right) MINIMALLY INVASIVE MITRAL VALVE (MV) REPLACEMENT (N/A)  Subjective: Feels better.  Denies SOB. Still w/out much of an appetite.  Objective: Vital signs: BP Readings from Last 1 Encounters:  05/21/12 108/59   Pulse Readings from Last 1 Encounters:  05/21/12 72   Resp Readings from Last 1 Encounters:  05/21/12 13   Temp Readings from Last 1 Encounters:  05/21/12 97.6 F (36.4 C) Oral    Hemodynamics:    Physical Exam:  Rhythm:   AV paced  Breath sounds: Fairly clear  Heart sounds:  RRR no murmur  Incisions:  Clean and dry  Abdomen:  soft  Extremities:  warm   Intake/Output from previous day: 10/17 0701 - 10/18 0700 In: 1205.7 [P.O.:605; I.V.:450.7; IV Piggyback:150] Out: 2600 [Urine:2160; Chest Tube:440] Intake/Output this shift:    Lab Results:  Basename 05/21/12 0355 05/20/12 0340  WBC 20.8* 22.1*  HGB 9.5* 10.2*  HCT 29.2* 30.7*  PLT 85* 88*   BMET:  Basename 05/21/12 0355 05/20/12 0340  NA 140 142  K 3.1* 4.2  CL 106 109  CO2 26 22  GLUCOSE 168* 141*  BUN 15 12  CREATININE 1.04 1.02  CALCIUM 7.1* 7.5*    CBG (last 3)   Basename 05/20/12 2217 05/20/12 1817 05/20/12 1643  GLUCAP 199* 177* 179*   ABG    Component Value Date/Time   PHART 7.324* 05/19/2012 0130   HCO3 22.2 05/19/2012 0130   TCO2 18 05/19/2012 1644   ACIDBASEDEF 4.0* 05/19/2012 0130   O2SAT 93.0 05/19/2012 0130   CXR: Stable mild bibasilar atelectasis R>L  Assessment/Plan: S/P Procedure(s) (LRB): MAZE (Right) MINIMALLY INVASIVE MITRAL VALVE (MV) REPLACEMENT (N/A)  Overall doing well POD3 Still pacer-dependent, for perm pacer today Expected post op volume excess, diuresing better on lasix drip Expected post op acute blood loss anemia, mild, stable Postop leukocytosis likely related to stress-dose steroids - continue taper Hypokalemia on lasix drip   For perm pacer  today  D/C temporary pacing wires tomorrow assuming perm pacer in place  D/C chest tubes tomorrow after temporary wires out  Continue coumadin  Transfer step down once stable following perm pacer  Hold lasix drip this morning for perm pacer     OWEN,CLARENCE H 05/21/2012 8:01 AM

## 2012-05-21 NOTE — Progress Notes (Signed)
PT Cancellation Note  Patient Details Name: Nancy Bush MRN: [id number] DOB: [birth year]   Cancelled Treatment:    Reason Eval/Treat Not Completed: Patient at procedure or test/unavailable (pt having pacer placed).   [name] 05/21/2012, 10:51 AM

## 2012-05-21 NOTE — Consult Note (Signed)
Reason for Consult:bradycardia  Referring Physician: [name] is an 78 y.o. female.   HPI: The patient is a very pleasant 78 yo woman with a h/o mitral regurgitation, PAF, HTN, who underwent bioprosthetic mitral valve replacement and surgical MAZE. She had evidence of conduction system disease pre-op with LBBB. She has been bradycardic post-op with heart rates in the 30's with junctional rhythm and has been referred for PPM. She denies a h/o syncope. PMH: Past Medical History  Diagnosis Date  . MVP (mitral valve prolapse)   . Pulmonary hypertension     MILD TO MODERATE BY ECHO  . History of chest pain   . Mitral regurgitation     MILD  . Asthma   . Rheumatoid arthritis   . Shortness of breath   . H/O: hysterectomy   . LBBB (left bundle branch block)     per discharge note 2003  . PVC (premature ventricular contraction)   . Atrial fibrillation   . Hypertension   . Heart murmur   . S/P mitral valve replacement with bioprosthetic valve 05/18/2012    27mm Edwards Magna Mitral pericardial bioprosthesis   . S/P Maze operation for atrial fibrillation 05/18/2012    Complete biatrial lesion set using cryothermy with oversewing of LA appendage    PSHX: Past Surgical History  Procedure Date  . Cholecystectomy   . Ovarian cyst removal   . Hysterectomy   . Appendectomy   . Cataract extraction   . Tee without cardioversion 02/09/2012    Procedure: TRANSESOPHAGEAL ECHOCARDIOGRAM (TEE);  Surgeon: Pricilla Riffle, MD;  Location: [facility] ENDOSCOPY;  Service: Cardiovascular;  Laterality: N/A;  . Maze 05/18/2012    Procedure: MAZE;  Surgeon: Purcell Nails, MD;  Location: [facility] OR;  Service: Open Heart Surgery;  Laterality: Right;  . Mitral valve replacement 05/18/2012    Procedure: MINIMALLY INVASIVE MITRAL VALVE (MV) REPLACEMENT;  Surgeon: Purcell Nails, MD;  Location: [facility] OR;  Service: Open Heart Surgery;  Laterality: N/A;    FAMHX:History reviewed. No pertinent family  history.  Social History:  reports that she has never smoked. She does not have any smokeless tobacco history on file. She reports that she does not drink alcohol or use illicit drugs.  Allergies:  Allergies  Allergen Reactions  . Promethazine Hcl Other (See Comments)    unknown  . Sulfonamide Derivatives Other (See Comments)    unknown  . Tequin Other (See Comments)    hallucinations     Medications: reviewed  Dg Chest Portable 1 View In Am  05/20/2012  *RADIOLOGY REPORT*  Clinical Data: Post mitral valve replacement.  PORTABLE CHEST - 1 VIEW  Comparison: 05/19/2012.  Findings: Post valve replacement.  Right-sided chest tube remains in place.  No gross pneumothorax.  Swan-Ganz catheter has been removed.  Introducer remains in place with the tip projecting over the aortic arch level possibly at the left bracheocephalic vein level.  Kink at the level of the skin insertion site.  Left central line tip mid superior vena cava level.  Cardiomegaly.  Basilar subsegmental atelectasis with small bilateral pleural effusions suspected.  Mild central pulmonary vascular prominence without pulmonary edema.  IMPRESSION: Swan-Ganz catheter removed.  Introducer remains in place as noted above.  Basilar atelectatic changes with small pleural effusions suspected.  Cardiomegaly post valve replacement.   Original Report Authenticated By: Fuller Canada, M.D.     ROS  As stated in the HPI and negative for all other systems.  Physical Exam  Vitals:Blood pressure 108/59, pulse 72, temperature 97.6 F (36.4 C), temperature source Oral, resp. rate 13, weight 125 lb 3.5 oz (56.8 kg), SpO2 93.00%.  Well appearing NAD HEENT: Unremarkable Neck:  No JVD, no thyromegally Lymphatics:  No adenopathy Back:  No CVA tenderness Lungs:  Clear HEART:  Regular rate rhythm, no murmurs, no rubs, no clicks Abd:  Flat, positive bowel sounds, no organomegally, no rebound, no guarding Ext:  2 plus pulses, no edema, no  cyanosis, no clubbing Skin:  No rashes no nodules Neuro:  CN II through XII intact, motor grossly intact  Tele - av sequential pacing Assessment/Plan: 1. Symptomatic brady after MVR/MAZE 2. Atrial fib 3. LBBB preop 4. HTN Rec: I have discussed the risk/benefit/goals/expectations of PPM insertion with the patient and she wishes to proceed.  Nancy Gowda TaylorMD 05/21/2012, 7:39 AM

## 2012-05-21 NOTE — Op Note (Signed)
DDD PPM via the left cephalic vein without immediate complication. [id number].

## 2012-05-22 ENCOUNTER — Inpatient Hospital Stay (HOSPITAL_COMMUNITY): Payer: Medicare Other

## 2012-05-22 LAB — GLUCOSE, CAPILLARY
Glucose-Capillary: 164 mg/dL — ABNORMAL HIGH (ref 70–99)
Glucose-Capillary: 125 mg/dL — ABNORMAL HIGH (ref 70–99)

## 2012-05-22 LAB — CBC
Hemoglobin: 8.8 g/dL — ABNORMAL LOW (ref 12.0–15.0)
MCH: 29.7 pg (ref 26.0–34.0)
Platelets: 81 10*3/uL — ABNORMAL LOW (ref 150–400)
RBC: 2.96 MIL/uL — ABNORMAL LOW (ref 3.87–5.11)
WBC: 15.6 10*3/uL — ABNORMAL HIGH (ref 4.0–10.5)

## 2012-05-22 LAB — TYPE AND SCREEN: Unit division: 0

## 2012-05-22 LAB — PROTIME-INR
INR: 2.08 — ABNORMAL HIGH (ref 0.00–1.49)
Prothrombin Time: 22.5 seconds — ABNORMAL HIGH (ref 11.6–15.2)

## 2012-05-22 LAB — BASIC METABOLIC PANEL
CO2: 28 mEq/L (ref 19–32)
Calcium: 7.1 mg/dL — ABNORMAL LOW (ref 8.4–10.5)
Chloride: 98 mEq/L (ref 96–112)
Glucose, Bld: 163 mg/dL — ABNORMAL HIGH (ref 70–99)
Potassium: 3.3 mEq/L — ABNORMAL LOW (ref 3.5–5.1)
Sodium: 135 mEq/L (ref 135–145)

## 2012-05-22 MED ORDER — WARFARIN SODIUM 1 MG PO TABS
1.0000 mg | ORAL_TABLET | ORAL | Status: DC
  Filled 2012-05-22: qty 1

## 2012-05-22 MED ORDER — FUROSEMIDE 10 MG/ML IJ SOLN
40.0000 mg | Freq: Two times a day (BID) | INTRAMUSCULAR | Status: DC
  Administered 2012-05-22 – 2012-05-23 (×3): 40 mg via INTRAVENOUS
  Filled 2012-05-22 (×5): qty 4

## 2012-05-22 MED ORDER — POTASSIUM CHLORIDE CRYS ER 20 MEQ PO TBCR
40.0000 meq | EXTENDED_RELEASE_TABLET | Freq: Two times a day (BID) | ORAL | Status: DC
  Administered 2012-05-22 – 2012-05-25 (×8): 40 meq via ORAL
  Filled 2012-05-22 (×9): qty 2

## 2012-05-22 NOTE — Op Note (Signed)
Nancy Bush, Nancy Bush NO.:  [id number]  MEDICAL RECORD NO.:  [id number]  LOCATION:  2315                         FACILITY:  [facility]  PHYSICIAN:  Doylene Canning. Ladona Ridgel, MD    DATE OF BIRTH:  February 08, 1926  DATE OF PROCEDURE:  05/21/2012 DATE OF DISCHARGE:                              OPERATIVE REPORT   PROCEDURE PERFORMED:  Insertion of a dual-chamber pacemaker.  INDICATION:  Symptomatic bradycardia following mitral valve replacement in the setting of underlying left bundle-branch block.  INTRODUCTION:  The patient is an 78 year old woman who underwent mitral valve replacement several days ago.  Preoperatively, she has left bundle branch block and AFib.  She underwent Maze as well as mitral valve replacement, and postoperatively, she was quite bradycardic.  She is now referred for insertion of a dual-chamber pacemaker.  PROCEDURE:  After informed consent was obtained, the patient was taken to the Diagnostic EP Lab in the fasting state.  After usual preparation and draping, intravenous fentanyl and midazolam was given for sedation. A 30 mL of lidocaine was infiltrated into the left infraclavicular region and 5-cm incision was carried out down to the fascial plane. Initial attempts to cannulate the subclavian vein were unsuccessful. The cephalic vein was then dissected free and isolated.  The Guidant Dextrus model 4136 53-cm active fixation pacing lead, serial number [id number] was advanced by way of the left cephalic vein into the right ventricle and the Guidant Dextrus model 4135 active fixation pacing lead, serial number [id number] was advanced into the right atrium. Mapping was first carried out in the right ventricle.  Throughout the right ventricle, the R-waves were somewhat smaller than would be expected.  However, at the final site with the lead actively fixed, the R-wave was 13 volts through the device.  The pacing impedance was 600 ohms and the threshold of 0.5 volts  at 0.4 milliseconds.  A large injury current was present.  With the ventricular lead in satisfactory position, attention was then turned to placement of the atrial lead, which was placed in the anterolateral portion of the right atrium where the P-waves measured 2 millivolts.  The pacing impedance was 464 ohms. Initially, the patient was in atrial flutter.  Following the completion, the procedure of the patient was paced out of the atrial flutter back to sinus rhythm where the threshold in the atrium was 0.6 volts at 0.4 milliseconds.  With these satisfactory parameters, the lead was secured to the subpectoral fascia with figure-of-eight silk suture.  The sewing sleeve was secured with silk suture.  Electrocautery was utilized to make a subcutaneous pocket.  Antibiotic irrigation was utilized to irrigate the pocket, and electrocautery was utilized to assure hemostasis.  The [facility] Scientific ADVANTIO dual-chamber pacemaker, serial number [id number] was connected through the atrial and ventricular leads and placed back in the subcutaneous pocket.  The device was secured with silk suture.  The pocket was irrigated and the incision was closed with 2-0 and 3-0 Vicryl.  Benzoin and Steri-Strips were painted on the skin, pressure dressing was applied, and the patient was returned to her room in satisfactory condition.  COMPLICATIONS:  There were no immediate procedure complications.  RESULTS:  This demonstrate successful implantation  of a [employer] pacemaker and the patient with symptomatic bradycardia following mitral valve replacement and Maze surgery.     Doylene Canning. Ladona Ridgel, MD     GWT/MEDQ  D:  05/21/2012  T:  05/22/2012  Job:  [id number]  cc:   Salvatore Decent. Cornelius Moras, M.D.

## 2012-05-22 NOTE — Progress Notes (Signed)
Physical Therapy Treatment Patient Details Name: Nancy Bush MRN: [id number] DOB: 1925-08-12 Today's Date: 05/22/2012 Time: 7829-5621 PT Time Calculation (min): 28 min  PT Assessment / Plan / Recommendation Comments on Treatment Session  Pt. s/p MVR and Maze as well as pacemaker.  Progressing well.      Follow Up Recommendations  Home health PT;Supervision/Assistance - 24 hour     Does the patient have the potential to tolerate intense rehabilitation     Barriers to Discharge        Equipment Recommendations  None recommended by PT    Recommendations for Other Services    Frequency Min 3X/week   Plan Discharge plan remains appropriate;Frequency remains appropriate    Precautions / Restrictions Precautions Precautions: Fall Restrictions Weight Bearing Restrictions: No   Pertinent Vitals/Pain VSS, No pain    Mobility  Bed Mobility Bed Mobility: Supine to Sit;Sitting - Scoot to Edge of Bed;Sit to Supine Supine to Sit: 4: Min assist;HOB elevated Sitting - Scoot to Edge of Bed: 4: Min assist Sit to Supine: 4: Min assist;HOB flat Details for Bed Mobility Assistance: Assist to elevate trunk.   Transfers Transfers: Sit to Stand;Stand to Sit Sit to Stand: 4: Min assist;With upper extremity assist;From bed Stand to Sit: 4: Min assist;With upper extremity assist;To bed Details for Transfer Assistance: cues for hand placement, incr time to achieve fully upright position Ambulation/Gait Ambulation/Gait Assistance: 4: Min assist Ambulation Distance (Feet): 150 Feet Assistive device:  (pushing wheelchair) Gait Pattern: Step-through pattern;Decreased stride length;Narrow base of support;Trunk flexed Gait velocity: decreased Stairs: No Wheelchair Mobility Wheelchair Mobility: No     PT Goals Acute Rehab PT Goals PT Goal: Supine/Side to Sit - Progress: Progressing toward goal PT Goal: Sit to Supine/Side - Progress: Progressing toward goal PT Goal: Sit to Stand -  Progress: Progressing toward goal PT Goal: Stand to Sit - Progress: Progressing toward goal PT Goal: Ambulate - Progress: Progressing toward goal  Visit Information  Last PT Received On: 05/22/12 Assistance Needed: +1    Subjective Data  Subjective: "I know I need to try."   Cognition  Overall Cognitive Status: Appears within functional limits for tasks assessed/performed Arousal/Alertness: Awake/alert Behavior During Session: [facility] for tasks performed    Balance  Static Standing Balance Static Standing - Balance Support: Bilateral upper extremity supported;During functional activity Static Standing - Level of Assistance: 5: Stand by assistance  End of Session PT - End of Session Equipment Utilized During Treatment: Gait belt Activity Tolerance: Patient tolerated treatment well Patient left: in bed;with call bell/phone within reach;with nursing in room Nurse Communication: Mobility status       INGOLD,Titania Gault 05/22/2012, 2:44 PM  [facility] Acute Rehabilitation [phone] (pager)

## 2012-05-22 NOTE — Progress Notes (Signed)
1 Day Post-Op Procedure(s) (LRB): PERMANENT PACEMAKER INSERTION (N/A) Subjective: No complaints this AM  Objective: Vital signs in last 24 hours: Temp:  [97.5 F (36.4 C)-98.5 F (36.9 C)] 97.8 F (36.6 C) (10/19 0717) Pulse Rate:  [72-102] 91  (10/19 0700) Cardiac Rhythm:  [-] A-V Sequential paced (10/18 1953) Resp:  [7-21] 18  (10/19 0700) BP: (91-131)/(47-72) 124/58 mmHg (10/19 0600) SpO2:  [95 %-100 %] 99 % (10/19 0753) Weight:  [122 lb 12.7 oz (55.7 kg)] 122 lb 12.7 oz (55.7 kg) (10/19 0600)  Hemodynamic parameters for last 24 hours:    Intake/Output from previous day: 10/18 0701 - 10/19 0700 In: 1008 [P.O.:365; I.V.:593; IV Piggyback:50] Out: 2350 [Urine:2005; Chest Tube:345] Intake/Output this shift:    General appearance: alert and no distress Neurologic: intact Heart: regular rate and rhythm Lungs: diminished breath sounds bibasilar  Lab Results:  Basename 05/22/12 0432 05/21/12 0355  WBC 15.6* 20.8*  HGB 8.8* 9.5*  HCT 27.0* 29.2*  PLT 81* 85*   BMET:  Basename 05/22/12 0432 05/21/12 0355  NA 135 140  K 3.3* 3.1*  CL 98 106  CO2 28 26  GLUCOSE 163* 168*  BUN 16 15  CREATININE 0.89 1.04  CALCIUM 7.1* 7.1*    PT/INR:  Basename 05/22/12 0432  LABPROT 22.5*  INR 2.08*   ABG    Component Value Date/Time   PHART 7.324* 05/19/2012 0130   HCO3 22.2 05/19/2012 0130   TCO2 18 05/19/2012 1644   ACIDBASEDEF 4.0* 05/19/2012 0130   O2SAT 93.0 05/19/2012 0130   CBG (last 3)   Basename 05/22/12 0716 05/21/12 2315 05/21/12 1545  GLUCAP 139* 164* 135*    Assessment/Plan: S/P Procedure(s) (LRB): PERMANENT PACEMAKER INSERTION (N/A) POD # 4 MVR, maze, POD # 1 pacer CV- paced, stable  RESP- pulmonary toilet  RENAL- hypokalemia- supplement, d/c lasix gtt, start intermittent lasix  INR up to 2.08, d/c lovenox, hold coumadin today  D/C EPW  Mobilize  CBG well controlled  Keep foley due to IV diuresis   LOS: 4 days    Ambrea Hegler  C 05/22/2012

## 2012-05-22 NOTE — Progress Notes (Signed)
Patient doing well from cardiac standpoint after insertion of dual-chamber pacer yesterday for symptomatic bradycardia. EKG shows atrial sensed ventricular paced rhythm.

## 2012-05-22 NOTE — Progress Notes (Signed)
Patient ID: Nancy Bush, female   DOB: January 10, 1926, 78 y.o.   MRN: [id number] Stable day Ambulated and "had a pretty good lunch" BP 110/51  Pulse 96  Temp 97.6 F (36.4 C) (Oral)  Resp 20  Wt 122 lb 12.7 oz (55.7 kg)  SpO2 95%   Intake/Output Summary (Last 24 hours) at 05/22/12 1747 Last data filed at 05/22/12 1600  Gross per 24 hour  Intake   1094 ml  Output   2140 ml  Net  -1046 ml    EPW were not removed as ordered today.  Will d/c in AM

## 2012-05-23 ENCOUNTER — Inpatient Hospital Stay (HOSPITAL_COMMUNITY): Payer: Medicare Other

## 2012-05-23 LAB — BASIC METABOLIC PANEL
CO2: 30 mEq/L (ref 19–32)
Chloride: 99 mEq/L (ref 96–112)
Creatinine, Ser: 0.87 mg/dL (ref 0.50–1.10)
Glucose, Bld: 149 mg/dL — ABNORMAL HIGH (ref 70–99)
Sodium: 134 mEq/L — ABNORMAL LOW (ref 135–145)

## 2012-05-23 LAB — CBC
HCT: 27 % — ABNORMAL LOW (ref 36.0–46.0)
MCHC: 32.6 g/dL (ref 30.0–36.0)
Platelets: 94 10*3/uL — ABNORMAL LOW (ref 150–400)
RDW: 17.2 % — ABNORMAL HIGH (ref 11.5–15.5)
WBC: 11.6 10*3/uL — ABNORMAL HIGH (ref 4.0–10.5)

## 2012-05-23 LAB — GLUCOSE, CAPILLARY: Glucose-Capillary: 176 mg/dL — ABNORMAL HIGH (ref 70–99)

## 2012-05-23 LAB — PROTIME-INR: INR: 1.87 — ABNORMAL HIGH (ref 0.00–1.49)

## 2012-05-23 MED ORDER — SODIUM CHLORIDE 0.9 % IJ SOLN: 3.0000 mL | INTRAMUSCULAR | Status: DC | PRN

## 2012-05-23 MED ORDER — WARFARIN SODIUM 1 MG PO TABS
1.0000 mg | ORAL_TABLET | Freq: Every day | ORAL | Status: DC
  Administered 2012-05-23 – 2012-05-24 (×2): 1 mg via ORAL
  Filled 2012-05-23 (×3): qty 1

## 2012-05-23 MED ORDER — FUROSEMIDE 40 MG PO TABS
40.0000 mg | ORAL_TABLET | Freq: Two times a day (BID) | ORAL | Status: DC
  Administered 2012-05-23 – 2012-05-26 (×5): 40 mg via ORAL
  Filled 2012-05-23 (×9): qty 1

## 2012-05-23 MED ORDER — SODIUM CHLORIDE 0.9 % IJ SOLN
3.0000 mL | Freq: Two times a day (BID) | INTRAMUSCULAR | Status: DC
  Administered 2012-05-23 – 2012-05-27 (×9): 3 mL via INTRAVENOUS

## 2012-05-23 MED ORDER — SODIUM CHLORIDE 0.9 % IV SOLN: 250.0000 mL | INTRAVENOUS | Status: DC | PRN

## 2012-05-23 MED ORDER — MOVING RIGHT ALONG BOOK
Freq: Once | Status: AC
  Administered 2012-05-23: 10:00:00
  Filled 2012-05-23: qty 1

## 2012-05-23 MED ORDER — ASPIRIN EC 81 MG PO TBEC
81.0000 mg | DELAYED_RELEASE_TABLET | Freq: Every day | ORAL | Status: DC
  Administered 2012-05-23: 81 mg via ORAL
  Filled 2012-05-23 (×4): qty 1

## 2012-05-23 NOTE — Progress Notes (Signed)
2 Days Post-Op Procedure(s) (LRB): PERMANENT PACEMAKER INSERTION (N/A) Subjective: Feels well this AM  Objective: Vital signs in last 24 hours: Temp:  [97.6 F (36.4 C)-98.4 F (36.9 C)] 97.9 F (36.6 C) (10/20 0719) Pulse Rate:  [93-104] 97  (10/20 0800) Cardiac Rhythm:  [-] Ventricular paced (10/20 0800) Resp:  [6-20] 6  (10/20 0800) BP: (103-135)/(48-92) 135/57 mmHg (10/20 0800) SpO2:  [93 %-99 %] 96 % (10/20 0800) Weight:  [122 lb 12.7 oz (55.7 kg)] 122 lb 12.7 oz (55.7 kg) (10/20 0600)  Hemodynamic parameters for last 24 hours:    Intake/Output from previous day: 10/19 0701 - 10/20 0700 In: 838 [P.O.:350; I.V.:480; IV Piggyback:8] Out: 1450 [Urine:1380; Chest Tube:70] Intake/Output this shift: Total I/O In: 10 [I.V.:10] Out: 35 [Urine:35]  General appearance: alert and no distress Neurologic: intact Heart: regular rate and rhythm Lungs: diminished breath sounds bibasilar Wound: clean and dry  Lab Results:  Basename 05/23/12 0440 05/22/12 0432  WBC 11.6* 15.6*  HGB 8.8* 8.8*  HCT 27.0* 27.0*  PLT 94* 81*   BMET:  Basename 05/23/12 0440 05/22/12 0432  NA 134* 135  K 4.2 3.3*  CL 99 98  CO2 30 28  GLUCOSE 149* 163*  BUN 15 16  CREATININE 0.87 0.89  CALCIUM 8.3* 7.1*    PT/INR:  Basename 05/23/12 0440  LABPROT 20.8*  INR 1.87*   ABG    Component Value Date/Time   PHART 7.324* 05/19/2012 0130   HCO3 22.2 05/19/2012 0130   TCO2 18 05/19/2012 1644   ACIDBASEDEF 4.0* 05/19/2012 0130   O2SAT 93.0 05/19/2012 0130   CBG (last 3)   Basename 05/23/12 0718 05/22/12 2352 05/22/12 1735  GLUCAP 140* 176* 172*    Assessment/Plan: S/P Procedure(s) (LRB): PERMANENT PACEMAKER INSERTION (N/A) Plan for transfer to step-down: see transfer orders CV- s/p MVR, maze, pacemaker- stable INR 1.9- resume coumadin 1 mg daily D/c external pacing wires CBG controlled Small right apical pneumo on CXR- follow Continue diuresis Thrombocytopenia improving    LOS: 5 days    Kailly Richoux C 05/23/2012

## 2012-05-23 NOTE — Progress Notes (Signed)
Pacing wires removed per order. INR 1.87 this am per labs obtained. Pt was supine in the bed during removing. Pt tolerated removal well. No tissue noted on the end of the wires after removal. Wires came out all in one piece. Pt was instructed to lay supine in the bed for an hour and that her vital signs would be taken more frequently per protocol. Will cont to assess and monitor pt per order/protocol.

## 2012-05-24 ENCOUNTER — Inpatient Hospital Stay (HOSPITAL_COMMUNITY): Payer: Medicare Other

## 2012-05-24 LAB — BASIC METABOLIC PANEL
BUN: 15 mg/dL (ref 6–23)
CO2: 29 mEq/L (ref 19–32)
Calcium: 8.6 mg/dL (ref 8.4–10.5)
Creatinine, Ser: 0.93 mg/dL (ref 0.50–1.10)
GFR calc non Af Amer: 55 mL/min — ABNORMAL LOW (ref >90)
Glucose, Bld: 118 mg/dL — ABNORMAL HIGH (ref 70–99)
Sodium: 133 mEq/L — ABNORMAL LOW (ref 135–145)

## 2012-05-24 LAB — GLUCOSE, CAPILLARY
Glucose-Capillary: 163 mg/dL — ABNORMAL HIGH (ref 70–99)
Glucose-Capillary: 117 mg/dL — ABNORMAL HIGH (ref 70–99)

## 2012-05-24 LAB — CBC
Hemoglobin: 9.8 g/dL — ABNORMAL LOW (ref 12.0–15.0)
MCH: 29.2 pg (ref 26.0–34.0)
MCHC: 31.9 g/dL (ref 30.0–36.0)
MCV: 91.4 fL (ref 78.0–100.0)
RBC: 3.36 MIL/uL — ABNORMAL LOW (ref 3.87–5.11)

## 2012-05-24 LAB — PROTIME-INR: Prothrombin Time: 17 seconds — ABNORMAL HIGH (ref 11.6–15.2)

## 2012-05-24 MED ORDER — METOCLOPRAMIDE HCL 5 MG/ML IJ SOLN
10.0000 mg | Freq: Four times a day (QID) | INTRAMUSCULAR | Status: AC
  Administered 2012-05-24 – 2012-05-26 (×8): 10 mg via INTRAVENOUS
  Filled 2012-05-24 (×10): qty 2

## 2012-05-24 MED ORDER — METOPROLOL TARTRATE 12.5 MG HALF TABLET
12.5000 mg | ORAL_TABLET | Freq: Two times a day (BID) | ORAL | Status: DC
  Filled 2012-05-24 (×2): qty 1

## 2012-05-24 MED ORDER — LEVALBUTEROL HCL 0.63 MG/3ML IN NEBU
0.6300 mg | INHALATION_SOLUTION | Freq: Two times a day (BID) | RESPIRATORY_TRACT | Status: DC
  Filled 2012-05-24 (×2): qty 3

## 2012-05-24 MED ORDER — LEVALBUTEROL TARTRATE 45 MCG/ACT IN AERO
2.0000 | INHALATION_SPRAY | Freq: Four times a day (QID) | RESPIRATORY_TRACT | Status: DC | PRN
  Filled 2012-05-24: qty 15

## 2012-05-24 MED ORDER — METOPROLOL TARTRATE 25 MG PO TABS
25.0000 mg | ORAL_TABLET | Freq: Two times a day (BID) | ORAL | Status: DC
  Administered 2012-05-24 (×2): 25 mg via ORAL
  Filled 2012-05-24 (×4): qty 1

## 2012-05-24 MED ORDER — LEVALBUTEROL HCL 0.63 MG/3ML IN NEBU
0.6300 mg | INHALATION_SOLUTION | Freq: Three times a day (TID) | RESPIRATORY_TRACT | Status: DC | PRN
  Administered 2012-05-24: 0.63 mg via RESPIRATORY_TRACT
  Filled 2012-05-24: qty 3

## 2012-05-24 NOTE — Progress Notes (Signed)
Physical Therapy Treatment Patient Details Name: Nancy Bush MRN: [id number] DOB: [birth year] Today's Date: 05/24/2012 Time: 7829-5621 PT Time Calculation (min): 27 min  PT Assessment / Plan / Recommendation Comments on Treatment Session  Pt s/p MVR and Maze as well as pacemaker. Progressing but unsure if pt has stamina to return home with elderly sister.  Pt fatigues quickly with activity.  May need ST-SNF.    Follow Up Recommendations  Post acute inpatient     Does the patient have the potential to tolerate intense rehabilitation  No, Recommend SNF  Barriers to Discharge        Equipment Recommendations  Rolling walker with 5" wheels    Recommendations for Other Services    Frequency Min 3X/week   Plan Discharge plan needs to be updated;Frequency remains appropriate    Precautions / Restrictions Precautions Precautions: Fall;ICD/Pacemaker Restrictions Weight Bearing Restrictions: No   Pertinent Vitals/Pain HR to 124 with amb    Mobility  Bed Mobility Supine to Sit: 5: Supervision;HOB elevated Sitting - Scoot to Edge of Bed: 5: Supervision Sit to Supine: 4: Min assist Details for Bed Mobility Assistance: Incr time Transfers Sit to Stand: 4: Min guard;With upper extremity assist;From bed;From toilet Stand to Sit: 4: Min guard;With upper extremity assist;To bed;To toilet Details for Transfer Assistance: Minimal use of arms. Ambulation/Gait Ambulation/Gait Assistance: 5: Supervision Ambulation Distance (Feet): 150 Feet Assistive device: Rolling walker Ambulation/Gait Assistance Details: verbal cues to stay closer to walker Gait Pattern: Step-through pattern;Decreased stride length;Trunk flexed Gait velocity: decr    Exercises     PT Diagnosis:    PT Problem List:   PT Treatment Interventions:     PT Goals Acute Rehab PT Goals Pt will go Supine/Side to Sit: with modified independence PT Goal: Supine/Side to Sit - Progress: Updated due to goal met PT  Goal: Sit to Supine/Side - Progress: Progressing toward goal PT Goal: Sit to Stand - Progress: Progressing toward goal PT Goal: Stand to Sit - Progress: Progressing toward goal Pt will Ambulate: >150 feet;with modified independence PT Goal: Ambulate - Progress: Updated due to goal met  Visit Information  Last PT Received On: 05/24/12 Assistance Needed: +1    Subjective Data  Subjective: Pt states she felt nauseous earlier this AM.   Cognition  Overall Cognitive Status: Appears within functional limits for tasks assessed/performed Arousal/Alertness: Awake/alert Orientation Level: Appears intact for tasks assessed Behavior During Session: [facility] for tasks performed    Balance  Static Standing Balance Static Standing - Balance Support: No upper extremity supported;During functional activity Static Standing - Level of Assistance: 5: Stand by assistance (stood at sink x 5 minutes to brush teeth, etc.)  End of Session PT - End of Session Equipment Utilized During Treatment: Gait belt Activity Tolerance: Patient limited by fatigue Patient left: in bed;with call bell/phone within reach;with bed alarm set   GP     [facility] 05/24/2012, 11:11 AM  [employer] PT [phone]

## 2012-05-24 NOTE — Progress Notes (Addendum)
[address]            [place] [address]          [phone]     3 Days Post-Op Procedure(s) (LRB): PERMANENT PACEMAKER INSERTION (N/A)  Subjective: C/o nausea, vomiting this am.  States she has had intermittent nausea since surgery, and the medications don't really help.  Appetite marginal.  +BM.   Objective: Vital signs in last 24 hours: Patient Vitals for the past 24 hrs:  BP Temp Temp src Pulse Resp SpO2 Height Weight  05/24/12 0438 141/77 mmHg 97.4 F (36.3 C) Oral 116  18  97 % - 124 lb (56.246 kg)  05/23/12 2135 - - - - - 98 % - -  05/23/12 2006 137/59 mmHg - - 105  18  - - -  05/23/12 1900 126/58 mmHg - - 101  8  97 % - -  05/23/12 1800 135/55 mmHg - - 105  12  99 % - -  05/23/12 1700 129/58 mmHg - - 106  17  99 % - -  05/23/12 1600 138/65 mmHg - - 104  14  97 % - -  05/23/12 1500 112/52 mmHg 97.9 F (36.6 C) Oral 100  9  96 % - -  05/23/12 1400 129/53 mmHg - - 104  18  99 % - -  05/23/12 1345 128/51 mmHg - - 103  17  100 % - -  05/23/12 1330 125/57 mmHg - - 102  16  99 % - -  05/23/12 1315 127/52 mmHg - - 101  19  99 % - -  05/23/12 1300 123/54 mmHg - - 104  17  99 % - -  05/23/12 1245 128/53 mmHg - - 102  15  97 % - -  05/23/12 1230 119/54 mmHg - - 101  16  98 % - -  05/23/12 1215 124/55 mmHg - - 100  7  97 % - -  05/23/12 1200 129/51 mmHg - - 101  12  98 % - -  05/23/12 1159 - 97.9 F (36.6 C) Oral - - - - -  05/23/12 1145 117/75 mmHg - - 101  17  99 % - -  05/23/12 1130 133/57 mmHg - - 100  14  98 % - -  05/23/12 1115 134/70 mmHg - - 100  16  99 % - -  05/23/12 1100 138/64 mmHg - - 102  19  99 % - -  05/23/12 1045 116/52 mmHg - - 101  17  99 % - -  05/23/12 1030 101/46 mmHg - - 101  10  96 % - -  05/23/12 1015 104/50 mmHg - - 101  14  96 % - -  05/23/12 1000 111/52 mmHg - - 102  13  97 % - -  05/23/12 0945 119/61 mmHg - - 103  13  96 % - -  05/23/12 0933 - - - - - - 4' 11.84" (1.52 m) -  05/23/12 0930 127/62 mmHg - -  103  7  95 % - -  05/23/12 0900 127/60 mmHg - - 103  15  98 % - -   Current Weight  05/24/12 124 lb (56.246 kg)     Intake/Output from previous day: 10/20 0701 - 10/21 0700 In: 544 [P.O.:500; I.V.:40; IV Piggyback:4] Out: 495 [Urine:495]  CBGs  [phone]  PHYSICAL EXAM:  Heart: RRR, paced, tachy at 110s Lungs: Clear Wound: Clean and dry Extremities: No edema Abdomen: Soft, NT/ND, +BS    Lab Results: CBC: Basename 05/24/12 0600 05/23/12 0440  WBC 10.5 11.6*  HGB 9.8* 8.8*  HCT 30.7* 27.0*  PLT 134* 94*   BMET:  Basename 05/24/12 0600 05/23/12 0440  NA 133* 134*  K 4.5 4.2  CL 99 99  CO2 29 30  GLUCOSE 118* 149*  BUN 15 15  CREATININE 0.93 0.87  CALCIUM 8.6 8.3*    PT/INR:  Basename 05/24/12 0600  LABPROT 17.0*  INR 1.42    CXR: Findings: Stable asymmetric elevation of the right hemidiaphragm.  Tiny bilateral pleural effusions persist. Emphysema noted. Tiny  right apical pneumothorax is not substantially changed in the  interval. Left dual lead permanent pacemaker again noted. The  cardiopericardial silhouette is enlarged.  IMPRESSION:  Emphysema with tiny right apical pneumothorax, unchanged.  Tiny bilateral pleural effusions.    Assessment/Plan: S/P Procedure(s) (LRB): PERMANENT PACEMAKER INSERTION (N/A)  CV- s/p PPM.  BPs stable, trending up.  May need to resume low dose beta blocker. Continue Coumadin- restarted yesterday at 1 mg/day.  GI- watch.  Tx nausea prn.  Meds reviewed and not on any that should be causing GI upset.  Will add a few doses of Reglan.  Vol overload- continue diuresis.  May be able to decrease Lasix soon.  Thrombocytopenia- plts significantly improved.  CRPI.  Hopefully home soon once GI symptoms improved and INR trending up.  HHRN and PT ordered.   LOS: 6 days    COLLINS,GINA H 05/24/2012   I have seen and examined the patient and agree with the assessment and plan as outlined.  OWEN,CLARENCE  H 05/24/2012 9:35 AM

## 2012-05-24 NOTE — Progress Notes (Signed)
CARDIAC REHAB PHASE I   PRE:  Rate/Rhythm: 97 paced  BP:  Supine: 140/72  Sitting:   Standing:    SaO2: 98%RA  MODE:  Ambulation: 150 ft   POST:  Rate/Rhythem: 98 paced  BP:  Supine:   Sitting: 133/68  Standing:    SaO2: 95%RA 1350-1430 Pt walked 150 ft on RA with rolling walker and asst x 1 and gait belt. Pt stated she was still having some nausea prior to walk. Discussed going farther tomorrow. To recliner after walk. Tired by end of walk.   Duanne Limerick

## 2012-05-25 ENCOUNTER — Encounter: Payer: Self-pay | Admitting: *Deleted

## 2012-05-25 DIAGNOSIS — Z950 Presence of cardiac pacemaker: Secondary | ICD-10-CM | POA: Insufficient documentation

## 2012-05-25 LAB — PROTIME-INR
INR: 1.25 (ref 0.00–1.49)
Prothrombin Time: 15.5 seconds — ABNORMAL HIGH (ref 11.6–15.2)

## 2012-05-25 LAB — GLUCOSE, CAPILLARY: Glucose-Capillary: 113 mg/dL — ABNORMAL HIGH (ref 70–99)

## 2012-05-25 MED ORDER — METOPROLOL TARTRATE 50 MG PO TABS
50.0000 mg | ORAL_TABLET | Freq: Two times a day (BID) | ORAL | Status: DC
  Administered 2012-05-25 – 2012-05-28 (×6): 50 mg via ORAL
  Filled 2012-05-25 (×8): qty 1

## 2012-05-25 MED ORDER — FUROSEMIDE 10 MG/ML IJ SOLN
40.0000 mg | Freq: Once | INTRAMUSCULAR | Status: AC
  Administered 2012-05-25: 40 mg via INTRAVENOUS
  Filled 2012-05-25: qty 4

## 2012-05-25 MED ORDER — WARFARIN SODIUM 2.5 MG PO TABS
2.5000 mg | ORAL_TABLET | Freq: Every day | ORAL | Status: DC
  Administered 2012-05-25 – 2012-05-27 (×3): 2.5 mg via ORAL
  Filled 2012-05-25 (×4): qty 1

## 2012-05-25 MED ORDER — WARFARIN - PHYSICIAN DOSING INPATIENT: Freq: Every day | Status: DC

## 2012-05-25 NOTE — Progress Notes (Addendum)
[address]            [place] [address]          [phone]     4 Days Post-Op  Procedure(s) (LRB): PERMANENT PACEMAKER INSERTION (N/A) Subjective: Tired after this am walk a little while ago  Objective  Telemetry vpaced  Temp:  [97.6 F (36.4 C)-98.4 F (36.9 C)] 98.1 F (36.7 C) (10/22 0529) Pulse Rate:  [91-124] 104  (10/22 0529) Resp:  [16-18] 16  (10/22 0529) BP: (125-150)/(67-92) 140/67 mmHg (10/22 0529) SpO2:  [94 %-100 %] 95 % (10/22 0529) Weight:  [118 lb 1.6 oz (53.57 kg)] 118 lb 1.6 oz (53.57 kg) (10/22 0222)   Intake/Output Summary (Last 24 hours) at 05/25/12 0272 Last data filed at 05/25/12 0730  Gross per 24 hour  Intake    220 ml  Output      0 ml  Net    220 ml       General appearance: alert, cooperative and fatigued Heart: regular rate and rhythm and tachy Lungs: dim R>L base Abdomen: soft, nontender, nondist, + BS Extremities: no edema Wound: incisions healing well  Lab Results:  Basename 05/24/12 0600 05/23/12 0440  NA 133* 134*  K 4.5 4.2  CL 99 99  CO2 29 30  GLUCOSE 118* 149*  BUN 15 15  CREATININE 0.93 0.87  CALCIUM 8.6 8.3*  MG -- --  PHOS -- --   No results found for this basename: AST:2,ALT:2,ALKPHOS:2,BILITOT:2,PROT:2,ALBUMIN:2 in the last 72 hours No results found for this basename: LIPASE:2,AMYLASE:2 in the last 72 hours  Basename 05/24/12 0600 05/23/12 0440  WBC 10.5 11.6*  NEUTROABS -- --  HGB 9.8* 8.8*  HCT 30.7* 27.0*  MCV 91.4 90.6  PLT 134* 94*   No results found for this basename: CKTOTAL:4,CKMB:4,TROPONINI:4 in the last 72 hours No components found with this basename: POCBNP:3 No results found for this basename: DDIMER in the last 72 hours No results found for this basename: HGBA1C in the last 72 hours No results found for this basename: CHOL,HDL,LDLCALC,TRIG,CHOLHDL in the last 72 hours No results found for this basename: TSH,T4TOTAL,FREET3,T3FREE,THYROIDAB in the  last 72 hours No results found for this basename: VITAMINB12,FOLATE,FERRITIN,TIBC,IRON,RETICCTPCT in the last 72 hours  Medications: Scheduled    . aspirin EC  81 mg Oral Daily  . bisacodyl  10 mg Oral Daily   Or  . bisacodyl  10 mg Rectal Daily  . docusate sodium  200 mg Oral Daily  . folic acid  2 mg Oral Daily  . furosemide  40 mg Oral BID  . insulin aspart  0-15 Units Subcutaneous TID WC  . metoCLOPramide (REGLAN) injection  10 mg Intravenous Q6H  . metoprolol tartrate  50 mg Oral BID  . pantoprazole  40 mg Oral Daily  . potassium chloride  40 mEq Oral BID  . predniSONE  5 mg Oral Q breakfast  . sodium chloride  3 mL Intravenous Q12H  . warfarin  2.5 mg Oral q1800  . Warfarin - Physician Dosing Inpatient   Does not apply q1800  . DISCONTD: levalbuterol  0.63 mg Nebulization TID  . DISCONTD: levalbuterol  0.63 mg Nebulization BID  . DISCONTD: metoprolol tartrate  12.5 mg Oral BID  . DISCONTD: metoprolol tartrate  25 mg Oral BID  . DISCONTD: warfarin  1 mg Oral q1800     Radiology/Studies:  Dg Chest 2 View  05/24/2012  *RADIOLOGY REPORT*  Clinical Data: Pacemaker placement  CHEST - 2 VIEW  Comparison: 05/23/2012  Findings: Stable asymmetric elevation of the right hemidiaphragm. Tiny bilateral pleural effusions persist.  Emphysema noted.  Tiny right apical pneumothorax is not substantially changed in the interval.  Left dual lead permanent pacemaker again noted. The cardiopericardial silhouette is enlarged.  IMPRESSION: Emphysema with tiny right apical pneumothorax, unchanged.  Tiny bilateral pleural effusions.   Original Report Authenticated By: [name], M.D.     INR:1.25 Will add last result for INR, ABG once components are confirmed Will add last 4 CBG results once components are confirmed  Assessment/Plan: S/P Procedure(s) (LRB): PERMANENT PACEMAKER INSERTION (N/A)  1. Doing well overall 2  cont coumadin increased to 2.5 mg 3 pacemaker as per cardiology  management 4 was going to go home with sister, now talking about short term SNF for further rehab, will ask SW to see       LOS: 7 days    GOLD,WAYNE E 10/22/20138:22 AM    I have seen and examined the patient and agree with the assessment and plan as outlined.  Increase metoprolol to 50 mg bid.  Will add low dose ACE-I if BP will allow once HR under a little better control.  Increase coumadin to 2.5 mg daily - was taking 3 mg daily PTA.  I favor d/c to SNF for short term rehab.  If she is to go to SNF she may possibly ready for d/c in 2-3 days  Treg Diemer H 05/25/2012 8:49 AM

## 2012-05-25 NOTE — Clinical Social Work Psychosocial (Signed)
Clinical Social Work Department BRIEF PSYCHOSOCIAL ASSESSMENT 05/25/2012  Patient:  Nancy Bush, Nancy Bush     Account Number:  [id number]     Admit date:  05/18/2012  Clinical Social Worker:  Thomasene Mohair  Date/Time:  05/25/2012 11:45 AM  Referred by:  Care Management  Date Referred:  05/24/2012 Referred for  SNF Placement   Other Referral:   Interview type:  Patient Other interview type:   Sister Nancy Abide was at bedside    PSYCHOSOCIAL DATA Living Status:  FAMILY Admitted from facility:   Level of care:   Primary support name:  Nancy Bush/[id number] Primary support relationship to patient:  SIBLING Degree of support available:   Good emotional support. 2 living sisters, one lives with Pt in the home. The other sister lives in [place].    CURRENT CONCERNS Current Concerns  Post-Acute Placement   Other Concerns:    SOCIAL WORK ASSESSMENT / PLAN CSW was referred to Pt for dc planning. 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 Pt was 1 of 8 children, 1 of 3 surviving. Pt lives with her sister Nancy Abide who is around the same age as the patient. Nancy Abide still drives during the day for short distances.  Pt has been assessed by PT and discussed in multidisciplinary rounds and it was determined that Pt would benefit from st-snf before transitioning home from the hospital. CSW, Pt and Pt's sister Nancy Abide discussed the benefits and facilities available for rehab. Pt would like to stay close to her home and is familiar with one that a friend went to for rehab. CSW will begin bed search and contact Pt's facility of interest.  CSW will update medical team as dc planning progresses and bed is confirmed.   Assessment/plan status:  Psychosocial Support/Ongoing Assessment of Needs Other assessment/ plan:   Information/referral to community resources:   List of area SNFs    PATIENT'S/FAMILY'S RESPONSE TO PLAN OF CARE: Pt states that she is tired and weak but  is motivated to get better. Pt would like to retun home to live with her sister after short-term SNF. Pt's sister is agreeable to dc plan as well.   Frederico Hamman, LCSW Clinical Social Worker [phone]

## 2012-05-25 NOTE — Progress Notes (Signed)
Physical Therapy Treatment Patient Details Name: Nancy Bush MRN: [id number] DOB: 05/02/1926 Today's Date: 05/25/2012 Time: 9528-4132 PT Time Calculation (min): 10 min  PT Assessment / Plan / Recommendation Comments on Treatment Session  Pt s/p minimally invasive MVR and Maze as well as pacemaker.  Still fatigues with minimal activity.      Follow Up Recommendations  Post acute inpatient     Does the patient have the potential to tolerate intense rehabilitation  No, Recommend SNF  Barriers to Discharge        Equipment Recommendations  Other (comment) (rollator)    Recommendations for Other Services    Frequency Min 3X/week   Plan Discharge plan remains appropriate;Frequency remains appropriate    Precautions / Restrictions Precautions Precautions: Fall;ICD/Pacemaker   Pertinent Vitals/Pain VSS    Mobility  Bed Mobility Sit to Supine: 4: Min assist Details for Bed Mobility Assistance: assist to bring legs up Transfers Sit to Stand: 4: Min guard;With upper extremity assist;From toilet Stand to Sit: 4: Min guard;With upper extremity assist;To bed Ambulation/Gait Ambulation/Gait Assistance: 5: Supervision Ambulation Distance (Feet): 165 Feet Assistive device: Rollator Ambulation/Gait Assistance Details: verbal cues to look up and stand more erect. Gait Pattern: Step-through pattern;Decreased stride length;Trunk flexed    Exercises     PT Diagnosis:    PT Problem List:   PT Treatment Interventions:     PT Goals Acute Rehab PT Goals PT Goal: Sit to Supine/Side - Progress: Progressing toward goal PT Goal: Sit to Stand - Progress: Progressing toward goal PT Goal: Stand to Sit - Progress: Progressing toward goal PT Goal: Ambulate - Progress: Progressing toward goal  Visit Information  Last PT Received On: 05/25/12 Assistance Needed: +1    Subjective Data  Subjective: Pt states she doesn't remember if she has walked 2 or 3 times today.   Cognition  Overall Cognitive Status: Appears within functional limits for tasks assessed/performed Arousal/Alertness: Awake/alert Orientation Level: Appears intact for tasks assessed Behavior During Session: [facility] for tasks performed    Balance  Static Standing Balance Static Standing - Balance Support: No upper extremity supported;During functional activity (washing hands) Static Standing - Level of Assistance: 5: Stand by assistance  End of Session PT - End of Session Activity Tolerance: Patient limited by fatigue Patient left: in bed;with call bell/phone within reach;with family/visitor present   GP     [facility] 05/25/2012, 3:57 PM  [facility] PT [phone]

## 2012-05-25 NOTE — Progress Notes (Signed)
CARDIAC REHAB PHASE I   PRE:  Rate/Rhythm: 91paced  BP:  Supine: 104/53  Sitting:   Standing:    SaO2: 95%RA  MODE:  Ambulation: 350 ft   POST:  Rate/Rhythem: 98paced  BP:  Supine:   Sitting: 120/58  Standing:    SaO2: 98%RA 1300-1346 Pt assisted to bathroom and then walked 350 ft on RA with rollator and gait belt use. Pt encouraged to look up and stay close to the rollator. Pt stated she liked the rollator better than the walker because it is easier to push. To recliner with call bell.   Duanne Limerick

## 2012-05-26 ENCOUNTER — Inpatient Hospital Stay (HOSPITAL_COMMUNITY): Payer: Medicare Other

## 2012-05-26 LAB — CBC
Hemoglobin: 9.4 g/dL — ABNORMAL LOW (ref 12.0–15.0)
MCH: 29.7 pg (ref 26.0–34.0)
MCHC: 31.8 g/dL (ref 30.0–36.0)
MCV: 93.4 fL (ref 78.0–100.0)
Platelets: 189 10*3/uL (ref 150–400)
RBC: 3.17 MIL/uL — ABNORMAL LOW (ref 3.87–5.11)

## 2012-05-26 LAB — BASIC METABOLIC PANEL
BUN: 12 mg/dL (ref 6–23)
CO2: 31 mEq/L (ref 19–32)
Calcium: 8.6 mg/dL (ref 8.4–10.5)
GFR calc non Af Amer: 61 mL/min — ABNORMAL LOW (ref >90)
Glucose, Bld: 179 mg/dL — ABNORMAL HIGH (ref 70–99)

## 2012-05-26 MED ORDER — DEXTROSE-NACL 5-0.9 % IV SOLN
INTRAVENOUS | Status: DC
  Administered 2012-05-26: 08:00:00 via INTRAVENOUS

## 2012-05-26 MED ORDER — FUROSEMIDE 40 MG PO TABS
40.0000 mg | ORAL_TABLET | Freq: Two times a day (BID) | ORAL | Status: DC
  Filled 2012-05-26 (×3): qty 1

## 2012-05-26 MED ORDER — TRAMADOL HCL 50 MG PO TABS: 50.0000 mg | ORAL_TABLET | ORAL | Status: DC | PRN

## 2012-05-26 MED ORDER — POTASSIUM CHLORIDE CRYS ER 20 MEQ PO TBCR
40.0000 meq | EXTENDED_RELEASE_TABLET | Freq: Two times a day (BID) | ORAL | Status: DC
  Filled 2012-05-26 (×2): qty 2

## 2012-05-26 NOTE — Progress Notes (Addendum)
                   [address]            [place] [address]          [phone]      5 Days Post-Op Procedure(s) (LRB): PERMANENT PACEMAKER INSERTION (N/A)  Subjective: Patient very somnolent this am. She is arousable, but not oriented.  Objective: Vital signs in last 24 hours: Patient Vitals for the past 24 hrs: Temp:  [97.5 F (36.4 C)-98.2 F (36.8 C)] 98.2 F (36.8 C) (10/23 0353) Pulse Rate:  [104-109] 107  (10/23 0353) Cardiac Rhythm:  [-] Ventricular paced (10/23 0721) Resp:  [14-19] 19  (10/23 0353) BP: (110-132)/(55-71) 132/71 mmHg (10/23 0353) SpO2:  [94 %-100 %] 99 % (10/23 0353) Weight:  [117 lb 11.6 oz (53.4 kg)-123 lb 14.4 oz (56.2 kg)] 117 lb 11.6 oz (53.4 kg) (10/23 0205)  Pre op weight  52.2 kg Current Weight  05/26/12 117 lb 11.6 oz (53.4 kg)      Intake/Output from previous day: 10/22 0701 - 10/23 0700 In: 346 [P.O.:340; I.V.:6] Out: 650 [Urine:650]   Physical Exam:  Cardiovascular: V paced Pulmonary: Diminished at bases R > L; no rales, wheezes, or rhonchi. Abdomen: Soft, non tender, bowel sounds present. Extremities: Trace bilateral lower extremity edema. Wounds: Clean and dry.  No erythema or signs of infection. Neurologic: CN II-XII grossly intact without focal deficits  Lab Results: CBC: Basename 05/24/12 0600  WBC 10.5  HGB 9.8*  HCT 30.7*  PLT 134*   BMET:  Basename 05/24/12 0600  NA 133*  K 4.5  CL 99  CO2 29  GLUCOSE 118*  BUN 15  CREATININE 0.93  CALCIUM 8.6    PT/INR:  Lab Results  Component Value Date   INR 1.23 05/26/2012   INR 1.25 05/25/2012   INR 1.42 05/24/2012   ABG:  INR: Will add last result for INR, ABG once components are confirmed Will add last 4 CBG results once components are confirmed  Assessment/Plan:  1. CV - Vpaced.On Lopressor 50 bid, coumadin.May need to increase Coumadin if INR does not increase tomorrow.SBP in the 130's. May need to start low dose ACE I 2.   Pulmonary - Encourage incentive spirometer. 3. Volume Overload - On Lasix 40 bid. 4.  Acute blood loss anemia - Last H and H 9.8 and 30.7 5.Mild thrombocytopenia - last platelet up to 134,000. 6.Change in mental status this am. CBG checked and was 116. She has not been given any narcotics in last 24 hours and she has not been given anything for sleep.Will obtain CT of head, make NPO, VS Q 1.  ZIMMERMAN,DONIELLE MPA-C 05/26/2012,7:20 AM   I have seen and examined the patient and agree with the assessment and plan as outlined.  OWEN,CLARENCE H 05/26/2012 5:07 PM

## 2012-05-26 NOTE — Progress Notes (Addendum)
Patient is more alert than earlier this am. Her neurologic exam shows no deficits. She is more oriented. She has just informed me that she is allergic to aspirin and has had a couple of right hemorrhages from it. I have stopped ecasa 81 mg. She is sipping liquids without any difficulty. We are awaiting lab results (to rule out hyponatremia).  I have seen and examined the patient and agree with the assessment and plan as outlined.  Nancy Bush seems pretty much her usual self this afternoon.  Brain CT unremarkable.  Labs unremarkable with exception of slightly increased WBC.  She does have a persistent cough.  Will check repeat CXR and labs in am.  Check UA and urine culture.   Consider starting empiric antibiotics.  Probably can resume normal diet with nutritional supplements tomorrow.  Nancy Bush H 05/26/2012 5:08 PM

## 2012-05-26 NOTE — Clinical Social Work Note (Signed)
CSW gave Pt SNF bed offers. Pt alert, oriented, able to recall CSW visit and discussion from previous day. Pt's sister at bedside. CSW will f/u with preferred facility when dc time frame is established. Pt is aware that SNF will not hold a bed for her, but will try to accommodate her at dc.   Frederico Hamman, LCSW [phone]

## 2012-05-26 NOTE — Progress Notes (Signed)
PT Cancellation Note  Patient Details Name: Nancy Bush MRN: [id number] DOB: May 26, 1926   Cancelled Treatment:    Reason Eval/Treat Not Completed: Patient's level of consciousness. PT held this AM due to pt with AMS and going for CT. CT neg and pt currently ambulating with CR. Will check back tomorrow.   Yvonnie Schinke, [place] 05/26/2012, 1:43 PM

## 2012-05-26 NOTE — Progress Notes (Signed)
CARDIAC REHAB PHASE I   PRE:  Rate/Rhythm: 105 paced  BP:  Supine: 115/64  Sitting:   Standing:    SaO2: 95%RA  MODE:  Ambulation: 150 ft   POST:  Rate/Rhythem: 120  BP:  Supine:   Sitting: 106/64  Standing:    SaO2: 97%RA 1322-1400 Pt very lethargic but able to respond to questions. Assisted to [facility] prior to walk. Pt able to walk 150 ft on RA with gait belt and asst x 2 with very slow pace. Sat once to rest. Encouraged pt to look up during walk. Tired by end of walk and needing a little help steering. Generalized weakness but did not notice one side weaker than the other. Back to bed after walk. Sisters in room.  Nancy Bush

## 2012-05-27 ENCOUNTER — Telehealth: Payer: Self-pay | Admitting: Physician Assistant

## 2012-05-27 ENCOUNTER — Inpatient Hospital Stay (HOSPITAL_COMMUNITY): Payer: Medicare Other

## 2012-05-27 LAB — CBC
MCH: 29.4 pg (ref 26.0–34.0)
MCHC: 31.3 g/dL (ref 30.0–36.0)
Platelets: 174 10*3/uL (ref 150–400)
RDW: 18.4 % — ABNORMAL HIGH (ref 11.5–15.5)

## 2012-05-27 LAB — URINALYSIS, ROUTINE W REFLEX MICROSCOPIC
Hgb urine dipstick: NEGATIVE
Leukocytes, UA: NEGATIVE
Protein, ur: NEGATIVE mg/dL
Specific Gravity, Urine: 1.006 (ref 1.005–1.030)
Urobilinogen, UA: 1 mg/dL (ref 0.0–1.0)

## 2012-05-27 LAB — COMPREHENSIVE METABOLIC PANEL
ALT: 125 U/L — ABNORMAL HIGH (ref 0–35)
AST: 46 U/L — ABNORMAL HIGH (ref 0–37)
Albumin: 2.8 g/dL — ABNORMAL LOW (ref 3.5–5.2)
Alkaline Phosphatase: 67 U/L (ref 39–117)
Calcium: 8.1 mg/dL — ABNORMAL LOW (ref 8.4–10.5)
GFR calc Af Amer: 77 mL/min — ABNORMAL LOW (ref >90)
Potassium: 3.5 mEq/L (ref 3.5–5.1)
Sodium: 141 mEq/L (ref 135–145)
Total Protein: 5 g/dL — ABNORMAL LOW (ref 6.0–8.3)

## 2012-05-27 LAB — PROTIME-INR
INR: 1.46 (ref 0.00–1.49)
Prothrombin Time: 17.3 seconds — ABNORMAL HIGH (ref 11.6–15.2)

## 2012-05-27 MED ORDER — TRAMADOL HCL 50 MG PO TABS: 50.0000 mg | ORAL_TABLET | ORAL | Status: DC | PRN

## 2012-05-27 MED ORDER — ENSURE PUDDING PO PUDG
1.0000 | Freq: Three times a day (TID) | ORAL | Status: DC
  Administered 2012-05-27 (×2): 1 via ORAL

## 2012-05-27 MED ORDER — POTASSIUM CHLORIDE CRYS ER 20 MEQ PO TBCR: 20.0000 meq | EXTENDED_RELEASE_TABLET | Freq: Every day | ORAL | Status: DC

## 2012-05-27 MED ORDER — FUROSEMIDE 40 MG PO TABS: 40.0000 mg | ORAL_TABLET | Freq: Every day | ORAL | Status: DC

## 2012-05-27 MED ORDER — FUROSEMIDE 40 MG PO TABS
40.0000 mg | ORAL_TABLET | Freq: Every day | ORAL | Status: DC
  Administered 2012-05-27 – 2012-05-28 (×2): 40 mg via ORAL
  Filled 2012-05-27 (×2): qty 1

## 2012-05-27 MED ORDER — METOPROLOL TARTRATE 50 MG PO TABS: 50.0000 mg | ORAL_TABLET | Freq: Two times a day (BID) | ORAL | Status: DC

## 2012-05-27 MED ORDER — LEVALBUTEROL TARTRATE 45 MCG/ACT IN AERO: 2.0000 | INHALATION_SPRAY | Freq: Four times a day (QID) | RESPIRATORY_TRACT | Status: DC | PRN

## 2012-05-27 MED ORDER — POTASSIUM CHLORIDE CRYS ER 20 MEQ PO TBCR
40.0000 meq | EXTENDED_RELEASE_TABLET | Freq: Every day | ORAL | Status: DC
  Administered 2012-05-27 – 2012-05-28 (×2): 40 meq via ORAL
  Filled 2012-05-27: qty 2

## 2012-05-27 NOTE — Clinical Documentation Improvement (Signed)
CHANGE MENTAL STATUS DOCUMENTATION CLARIFICATION   THIS DOCUMENT IS NOT A PERMANENT PART OF THE MEDICAL RECORD  TO RESPOND TO THE THIS QUERY, FOLLOW THE INSTRUCTIONS BELOW:  1. If needed, update documentation for the patient's encounter via the notes activity.  2. Access this query again and click edit on the In [employer].  3. After updating, or not, click F2 to complete all highlighted (required) fields concerning your review. Select "additional documentation in the medical record" OR "no additional documentation provided".  4. Click Sign note button.  5. The deficiency will fall out of your In Basket *Please let us know if you are not able to complete this workflow by phone or e-mail (listed below).         05/27/12  Dear Doree Fudge Marton Redwood  In an effort to better capture your patient's severity of illness, reflect appropriate length of stay and utilization of resources, a review of the patient medical record has revealed the following indicators.    Based on your clinical judgment, please clarify and document in a progress note and/or discharge summary the clinical condition associated with the following supporting information:  In responding to this query please exercise your independent judgment.  The fact that a query is asked, does not imply that any particular answer is desired or expected.   Possible Clinical Conditions?  _______Encephalopathy (describe type if known)                       Septic                       Hypertensive                       Metabolic                        _______Drug induced confusion/delirium _______Acute confusion _______Acute delirium _______Acute exacerbation of known dementia (indicate type) _______New diagnosis of Dementia, Alzheimer's, cerebral atherosclerosis _______Hyponatremia / Hypernatremia _______Poisoning / Overdose _______Hypoxemia / Hypoxia _______Other Condition _______Cannot Clinically  Determine    Risk Factors: 10/23:  Change in mental status this am. No narcotics in the last 24hours, nothing for sleep. Will obtain CT of head, make NPO, v/s q1 hour per 10/23 progress notes. 10/24:  Resolution of change in mental status. Await u/a and urine culture results per 10/24 progress notes.   Reviewed: No additional documentation provided.  Thank You,  Marciano Sequin,  Clinical Documentation Specialist:  Pager: [phone]  Health Information Management [place]

## 2012-05-27 NOTE — Progress Notes (Signed)
Clinical Child psychotherapist (CSW) informed that pt will Engineer, building services. Pt and family aware and agreeable. CSW has notified [facility] who has agreed to accept pt tomorrow. CSW to to facilitate with dc tomorrow.  Theresia Bough, MSW, Theresia Majors [phone]

## 2012-05-27 NOTE — Progress Notes (Signed)
CARDIAC REHAB PHASE I   PRE:  Rate/Rhythm: 85 paced    BP: sitting 108/57    SaO2: 98 RA  MODE:  Ambulation: 550 ft   POST:  Rate/Rhythm: 96 pacing    BP: sitting 114/60     SaO2: 99 RA  Pt much improved today. Steady, alert. Used rollator and supervision to ambulate. Tired toward end but increased distance. Assisted by x2 persons. Discussed [facility] for future and pt requests her name be sent to [place] [facility]. [phone]  Nancy Bush CES, ACSM

## 2012-05-27 NOTE — Progress Notes (Signed)
Pt amb 550 ft in hallway with two assist.  Pt denied any SOB but stated "I'm tired and I'll think I'll rest" and sat in W/C. Pt continued short distance to room and was left reclining in chair. Will continue to monitor.

## 2012-05-27 NOTE — Progress Notes (Addendum)
                   [address]            [place] [address]          [phone]      6 Days Post-Op Procedure(s) (LRB): PERMANENT PACEMAKER INSERTION (N/A)  Subjective: Patient is awake and alert.About to have "liquid breakfast".  Objective: Vital signs in last 24 hours: Patient Vitals for the past 24 hrs: Temp:  [97.7 F (36.5 C)-98 F (36.7 C)] 97.9 F (36.6 C) (10/24 0436) Pulse Rate:  [98-108] 98  (10/24 0436) Cardiac Rhythm:  [-] Ventricular paced (10/23 1930) Resp:  [16-18] 17  (10/24 0436) BP: (115-139)/(69-79) 125/79 mmHg (10/24 0436) SpO2:  [94 %-100 %] 98 % (10/24 0436) Weight:  [111 lb 12.8 oz (50.712 kg)] 111 lb 12.8 oz (50.712 kg) (10/24 0556)  Pre op weight  52.2 kg Current Weight  05/27/12 111 lb 12.8 oz (50.712 kg)      Intake/Output from previous day:     Physical Exam:  Cardiovascular: V paced Pulmonary: Slightly diminished at bases R > L; no rales, wheezes, or rhonchi. Abdomen: Soft, non tender, bowel sounds present. Extremities: Trace bilateral lower extremity edema. Wounds: Clean and dry.  No erythema or signs of infection. Neurologic: CN II-XII grossly intact without focal deficits  Lab Results: CBC:  Basename 05/27/12 0450 05/26/12 1505  WBC 8.1 12.8*  HGB 8.7* 9.4*  HCT 27.8* 29.6*  PLT 174 189   BMET:   Basename 05/27/12 0450 05/26/12 1505  NA 141 138  K 3.5 3.8  CL 103 97  CO2 31 31  GLUCOSE 122* 179*  BUN 10 12  CREATININE 0.80 0.86  CALCIUM 8.1* 8.6    PT/INR:  Lab Results  Component Value Date   INR 1.46 05/27/2012   INR 1.23 05/26/2012   INR 1.25 05/25/2012   ABG:  INR: Will add last result for INR, ABG once components are confirmed Will add last 4 CBG results once components are confirmed  Assessment/Plan:  1. CV - Vpaced.On Lopressor 50 bid, coumadin. 2.  Pulmonary - Encourage incentive spirometer.CXR this am shows probable trace right apical ptx, small bilateral pleural effusions  R>L. 3. Volume Overload - On Lasix 40 bid.WIll give daily as below pre op weight and may be able to stop. 4.  Acute blood loss anemia - H and H this am 8.7 and 27.8. 5.Thrombocytopenia resolved - last platelet up to 189,000. 6.Resolution of change in mental status. Await UA/UC to see if that was the etiology. 7.Advance diet as tolerates and stop IVF. 8.To SNF when ready for discharge.  ZIMMERMAN,DONIELLE MPA-C 05/27/2012,7:25 AM     Chart reviewed, patient examined, agree with above.

## 2012-05-27 NOTE — Discharge Summary (Signed)
Physician Discharge Summary  Patient ID: Nancy Bush MRN: [id number] DOB/AGE: [birth year] 78 y.o.  Admit date: 05/18/2012 Discharge date: 05/28/2012  Admission Diagnoses:  Patient Active Problem List  Diagnosis  . Mitral Regurgitation  . PULMONARY HYPERTENSION  . MITRAL VALVE PROLAPSE  . SINUSITIS, CHRONIC  . ALLERGIC RHINITIS  . BRONCHITIS  . COPD  . ABNORMAL ELECTROCARDIOGRAM  . SHORTNESS OF BREATH  . CHEST PAIN UNSPECIFIED  . Atrial fibrillation  . Encounter for long-term (current) use of anticoagulants  . CKD (chronic kidney disease)  . Acute on chronic diastolic heart failure  . Fatigue  . MR (mitral regurgitation)  . Severe mitral regurgitation  . A-fib   Discharge Diagnoses:   Patient Active Problem List  Diagnosis  . Mitral Regurgitation  . PULMONARY HYPERTENSION  . MITRAL VALVE PROLAPSE  . SINUSITIS, CHRONIC  . ALLERGIC RHINITIS  . BRONCHITIS  . COPD  . ABNORMAL ELECTROCARDIOGRAM  . SHORTNESS OF BREATH  . CHEST PAIN UNSPECIFIED  . Atrial fibrillation  . Encounter for long-term (current) use of anticoagulants  . CKD (chronic kidney disease)  . Acute on chronic diastolic heart failure  . Fatigue  . MR (mitral regurgitation)  . Severe mitral regurgitation  . A-fib  . S/P mitral valve replacement with bioprosthetic valve  . S/P Maze operation for atrial fibrillation  . Bradycardia  . Pacemaker-[employer]   Discharged Condition: good  History of Present Illness:   Nancy Bush is an 78 yo female with known history of Mitral Valve Prolapse with Mitral Regurgitation, Pulmonary Hypertension, Persistent Atrial Fibrillation and CHF.  The patient stated she was found to have a heart murmur on a routine physical exam several years ago.  She underwent ECHO which confirmed a diagnosis of Mitral Valve Prolapse, Pulmonary Hypertension, and Tricuspid Regurgitation.  She has been routinely followed by Nancy Bush and Nancy Bush.  In October 2012 the  patient presented with new onset persistent Atrial Fibrillation and acute CHF exacerbation.  She was placed on Coumadin at that time.  Most recent Echocardiogram on 02/09/2012 showed mitral valve prolapse with Moderate to severe Mitral Regurgitation and preserved LV function.  It was felt that due to her disease progression the patient would benefit from surgical evaluation.  She was referred to Nancy Bush for evaluation and was initially seen on 04/23/2012 at which time the patient stated that over the past 2 years she has noted exertional dyspnea.  However over the past year she has been bothered by progressive fatigue.  She also has some LE edema which she takes diuretic therapy for.  It was felt the patient would benefit from surgical intervention.  However her risks would be increased due to chronic use of steroids for Rheumatoid arthritis.  The risks and benefits of the procedure were explained to the patient as well as her different surgical approaches available.  The patient was interested in proceeding with surgery, but wished to think matters over further before making a final decision.  She presented for follow up one week later at which time she was agreeable to proceed with surgery scheduled for May 18, 2012.  In the meantime the patient underwent cardiac catheterization which did not show evidence of significant coronary disease.    Hospital Course:   On 05/18/2012 the patient presented to [facility].  She was taken to the operating room and underwent a Minimally Invasive Mitral Valve Replacement utilizing a 27mm Edwards Magna Ease Bioprosthetic Tissue Valve and a Complete MAZE procedure  with over sewing of the Left Atrial Appendage.  The patient tolerated the procedure well and was taken to the SICU in stable condition.  POD #0 patient was extubated.  She received supplementation for hypokalemia.  POD #1 patient's SWAN ganz catheter was removed.  She developed transient complete heart  block during catheterization and bradycardic post op requiring pacing.  POD #2 Patient was placed on Lasix drip for hypervolemia.  She continued to require pacing and EP consult was placed.  She was evaluated by Nancy Bush who felt pacemaker would be needed.  Her coumadin was restarted.  POD #3 patient underwent pacemaker placement.  She tolerated the procedure well.  POD #4 patients chest tubes were removed without difficulty.  Her Lasix drip was discontinued.  POD #5 the patient's external pacing wires were removed.  Chest xray with evidence of small apical pneumothorax.  She was medically stable and transferred to step down unit.  POD #6 patient with slowing rising blood pressure.  She was started on Lopressor  POD #7 patient with somnolence and confusion.  Head CT obtained which was unremarkable.  Patients mentation improved in the afternoon.  Urinalysis and culture will be obtained.  POD #8 the patient is better this morning.  We are still waiting for a urinalysis to be completed.  Pending those results patient may require antibiotic coverage.  Her INR this morning is 1.46 and she will continue on Coumadin 3mg  daily at discharge, which was her home dose.  The patient continues to make progress.  She will require SNF at discharge which pending no further issues arise likely in the next 24-48 hours.  She will need to follow up with Nancy Bush in 2 weeks with a chest xray.  She will also need to follow up with Nancy Bush in 2 weeks.  She will need to contact his office to set up and appointment.Finally, the SNF needs to draw a PT and INR on Monday 05/31/2012 and fax or call results to Nancy Bush office.  Consults: cardiology  Significant Diagnostic Studies:   ECHO:   Mitral valve: There was severe mitral regurgitation. This appeared due to a flail posterior leaflet involving the P1 and P2 regions with an anteriorly directed jet which tracted along the superior surface of the anterior leaflet and along  the anterior wall of the left atrium. There was also severe mitral annular and valvular calcification noted. There was moderate to severe calcification involving the bases of the posterior and anterior leaflets. There were also 2 nodules of calcification involving the midportion of the anterior leaflet and one nodule in the P2 region of the posterior leaflet.  2. Aortic valve: The aortic valve was trileaflet the leaflets opened normally and there was trace aortic insufficiency. There was an no thickening or calcification of the leaflets noted.  3. Left ventricle: There was vigorous contractility of the left ventricle noted with no regional wall motion abnormalities. Ejection fraction was estimated 60-65%. The left ventricular end-diastolic diameter measured 4.62 cm at the cordall level in the transgastric long axis view. Left ventricular wall thickness measured 0.75-0.85 mm at end diastole.  4. Right ventricle: Imaging of the right right ventricle was suboptimal do to shadowing from the calcification in the mitral annulus and Poor probe contact. The right ventricular size appeared normal and there and there was normal appearing contractility the right ventricular free wall.  5. Tricuspid valve: The tricuspid valve leaflets appeared structurally normal. There was 2+ tricuspid insufficiency noted. The tricuspid  annulus measured 4.1 cm in the RV inflow outflow view and 3.5 cm in the 4 chamber view.  6. Right atrium: The right atrial cavity was markedly enlarged.  7. Intra-atrial septum: After careful interrogation with color Doppler and bubble studies: No patent foramen ovale could be appreciated  8. Left atrium: The left atrial cavity was markedly enlarged and measured 6.62 cm in the superior inferior dimension and 6.07 cm in the mediolateral dimension. There was no thrombus noted in the left atrial cavity or left atrial appendage.  9. Ascending aorta: The ascending aorta was not dilated and there was a  well-defined sinotubular ridge and aortic root without effacement. There was mild calcification noted in the wall of the ascending aorta in the region of the right sinus of Valsalva.   Post-bypass findings:  1. Mitral valve: There was a bioprosthetic valve in the mitral position. The leaflets open normally. There was a trace amount of mitral regurgitation centrally. Initially there were very faint perivalvular leaks noted in the medial and lateral aspects of the sewing ring in the 4 chamber view. These resolved over time following the administration of protamine. Continuous-wave Doppler of the mitral inflow revealed a mean gradient of 1 mm mercury.  2. Aortic valve: The aortic valve appeared unchanged from the pre-bypass study and was normal appearing. There was trace aortic insufficiency  3. Left ventricle: Examination of the left ventricle was difficult due to presumed poor probe contact: There was a dyssynchronous contractile pattern due to ventricular pacing. The estimated ejection fraction was 50-55% with no regional wall motion abnormalities noted.  4. Right ventricle: There appeared to be adequate contractility of the right ventricular free wall.  5. Tricuspid valve there was 1-2+ tricuspid insufficiency noted.  Cardiac Catheterization  Hemodynamics  RA 12  RV 38/13  PA not recorded  PCWP not recorded  LV 106/21  AO 106/56  Oxygen saturations:  AO 94  Coronary angiography:  Coronary dominance: right  Left mainstem: Widely patent with minor luminal irregularity peer  Left anterior descending (LAD): The LAD is patent throughout. There is mild stenosis at the first septal perforator estimated at approximately 30%. There is no significant obstructive disease throughout the course of the LAD. The first diagonal branch is patent and it is a small vessel.  Left circumflex (LCx): The circumflex is patent throughout. The first OM is patent. The AV groove circumflex is very small.  Right  coronary artery (RCA): The RCA is dominant. There are minor irregularities in the proximal vessel. The mid vessel has a 30-40% stenosis. There is moderate calcification present. The PDA and posterolateral branches are patent.  Left ventriculography: Left ventricular function is vigorous. The left ventricular ejection fraction is 65-70%. There is severe mitral regurgitation present. The mitral annulus is heavily calcified.  Abdominal aortogram: The renal arteries are patent bilaterally. The abdominal aorta appears normal in caliber without significant stenosis or dilatation. The common, internal, and external iliac arteries are patent. The right common femoral artery is widely patent and appear suitable for cannulation.   Treatments: surgery:   Minimally-Invasive Mitral Valve Replacement  [facility] Mitral Bioprosthetic Tissue Valve, size 27mm, Model #[id number], serial [id number]  Maze Procedure  Complete biatrial lesion set with oversewing of LA appendage  Disposition: SNF  The patient has been discharged on:   1.Beta Blocker:  Yes [   x  No   [   ]                              If No, reason:  2.Ace Inhibitor/ARB: Yes [   ]                                     No  [  x  ]                                     If No, reason:Labile blood pressure, will assess at post operative visit  3.Statin:   Yes [   ]                  No  [  x ]                  If No, reason: No significant Coronary disease or hyperlipidemia  4.Marlowe KaysValentino Hue  [   ]                  No   [ x  ]                  If No, reason: Allergy     Discharge Orders    Future Appointments: Provider: Department: Dept Phone: Center:   05/28/2012 2:15 PM [facility] [phone] None   06/14/2012 11:00 AM Purcell Nails, MD [facility] [phone] [facility]   06/28/2012 11:00 AM Rollene Rotunda, MD [facility] [phone] [facility]         Medication List     Medication List     As of 05/27/2012  1:02 PM    STOP taking these medications         metoprolol succinate 25 MG 24 hr tablet   Commonly known as: TOPROL-XL      TAKE these medications         CALCIUM 600 + D PO   Take 1 tablet by mouth 2 (two) times daily.      folic acid 1 MG tablet   Commonly known as: FOLVITE   Take 2 mg by mouth daily.      furosemide 40 MG tablet   Commonly known as: LASIX   Take 1 tablet (40 mg total) by mouth daily.      methotrexate 2.5 MG tablet   Commonly known as: RHEUMATREX   Take 10 mg by mouth once a week. On Thursday      Caution:Chemotherapy. Protect from light.      metoprolol 50 MG tablet   Commonly known as: LOPRESSOR   Take 1 tablet (50 mg total) by mouth 2 (two) times daily.      potassium chloride SA 20 MEQ tablet   Commonly known as: K-DUR,KLOR-CON   Take 1 tablet (20 mEq total) by mouth daily.      predniSONE 5 MG tablet   Commonly known as: DELTASONE   Take 2.5 mg by mouth every other day.      traMADol 50 MG tablet   Commonly known as: ULTRAM   Take 1 tablet (50 mg total) by mouth every 4 (four) hours as needed.      warfarin 3 MG tablet   Commonly  known as: COUMADIN   Take 1 tablet (3 mg total) by mouth as directed.              Follow-up Information    Schedule an appointment as soon as possible for a visit with Rollene Rotunda, MD. (For 2 weeks)    Contact information:   [address] [place] [address] [phone]       Follow up with Purcell Nails, MD. (PA/LAT CXR to be taken (at [facility] which is in the same building as Dr. Orvan July office on 06/14/2012 at 10:00 am;Appointment with Nancy Bush is on 06/14/2012 at 11:00 am)    Contact information:   [address] [place] [address] [phone]       Schedule an appointment as soon as possible for a visit with Lewayne Bunting, MD. (For 1 week)    Contact  information:   [address] [place] [address] [phone]       Schedule an appointment as soon as possible for a visit with [name], MD. (Regarding further surveillance of pre op HGA1C 6)    Contact information:   [address] [place] [address] [phone]      SNF to draw a PT and INR on Monday 05/31/2012 and fax or call results to Nancy Bush office    Signed: Lowella Dandy 05/27/2012, 8:52 AM

## 2012-05-27 NOTE — Progress Notes (Signed)
Clinical Social Worker (CSW) spoke with pt and family regarding placement. Pt has a bed at [facility] today. CSW has informed RN and will facilitate with dc today.  MD to sign FL2 before pt discharges.  Theresia Bough, MSW, Theresia Majors [phone]

## 2012-05-28 DIAGNOSIS — J90 Pleural effusion, not elsewhere classified: Secondary | ICD-10-CM | POA: Diagnosis not present

## 2012-05-28 DIAGNOSIS — I079 Rheumatic tricuspid valve disease, unspecified: Secondary | ICD-10-CM | POA: Diagnosis present

## 2012-05-28 DIAGNOSIS — J9819 Other pulmonary collapse: Secondary | ICD-10-CM | POA: Diagnosis not present

## 2012-05-28 DIAGNOSIS — Z888 Allergy status to other drugs, medicaments and biological substances status: Secondary | ICD-10-CM | POA: Diagnosis not present

## 2012-05-28 DIAGNOSIS — T82897A Other specified complication of cardiac prosthetic devices, implants and grafts, initial encounter: Secondary | ICD-10-CM | POA: Diagnosis not present

## 2012-05-28 DIAGNOSIS — Z954 Presence of other heart-valve replacement: Secondary | ICD-10-CM | POA: Diagnosis not present

## 2012-05-28 DIAGNOSIS — I5033 Acute on chronic diastolic (congestive) heart failure: Secondary | ICD-10-CM | POA: Diagnosis not present

## 2012-05-28 DIAGNOSIS — I110 Hypertensive heart disease with heart failure: Secondary | ICD-10-CM | POA: Diagnosis not present

## 2012-05-28 DIAGNOSIS — Z7982 Long term (current) use of aspirin: Secondary | ICD-10-CM | POA: Diagnosis not present

## 2012-05-28 DIAGNOSIS — I129 Hypertensive chronic kidney disease with stage 1 through stage 4 chronic kidney disease, or unspecified chronic kidney disease: Secondary | ICD-10-CM | POA: Diagnosis present

## 2012-05-28 DIAGNOSIS — J449 Chronic obstructive pulmonary disease, unspecified: Secondary | ICD-10-CM | POA: Diagnosis not present

## 2012-05-28 DIAGNOSIS — N185 Chronic kidney disease, stage 5: Secondary | ICD-10-CM | POA: Diagnosis not present

## 2012-05-28 DIAGNOSIS — J4489 Other specified chronic obstructive pulmonary disease: Secondary | ICD-10-CM | POA: Diagnosis not present

## 2012-05-28 DIAGNOSIS — R0602 Shortness of breath: Secondary | ICD-10-CM | POA: Diagnosis not present

## 2012-05-28 DIAGNOSIS — Z79899 Other long term (current) drug therapy: Secondary | ICD-10-CM | POA: Diagnosis not present

## 2012-05-28 DIAGNOSIS — J9600 Acute respiratory failure, unspecified whether with hypoxia or hypercapnia: Secondary | ICD-10-CM | POA: Diagnosis not present

## 2012-05-28 DIAGNOSIS — I059 Rheumatic mitral valve disease, unspecified: Secondary | ICD-10-CM | POA: Diagnosis not present

## 2012-05-28 DIAGNOSIS — I442 Atrioventricular block, complete: Secondary | ICD-10-CM | POA: Diagnosis not present

## 2012-05-28 DIAGNOSIS — R7309 Other abnormal glucose: Secondary | ICD-10-CM | POA: Diagnosis not present

## 2012-05-28 DIAGNOSIS — Z7901 Long term (current) use of anticoagulants: Secondary | ICD-10-CM | POA: Diagnosis not present

## 2012-05-28 DIAGNOSIS — M069 Rheumatoid arthritis, unspecified: Secondary | ICD-10-CM | POA: Diagnosis present

## 2012-05-28 DIAGNOSIS — R627 Adult failure to thrive: Secondary | ICD-10-CM | POA: Diagnosis present

## 2012-05-28 DIAGNOSIS — I270 Primary pulmonary hypertension: Secondary | ICD-10-CM | POA: Diagnosis not present

## 2012-05-28 DIAGNOSIS — Z950 Presence of cardiac pacemaker: Secondary | ICD-10-CM | POA: Diagnosis not present

## 2012-05-28 DIAGNOSIS — R5381 Other malaise: Secondary | ICD-10-CM | POA: Diagnosis present

## 2012-05-28 DIAGNOSIS — R279 Unspecified lack of coordination: Secondary | ICD-10-CM | POA: Diagnosis not present

## 2012-05-28 DIAGNOSIS — M6281 Muscle weakness (generalized): Secondary | ICD-10-CM | POA: Diagnosis not present

## 2012-05-28 DIAGNOSIS — I4891 Unspecified atrial fibrillation: Secondary | ICD-10-CM | POA: Diagnosis not present

## 2012-05-28 DIAGNOSIS — R079 Chest pain, unspecified: Secondary | ICD-10-CM | POA: Diagnosis not present

## 2012-05-28 DIAGNOSIS — K5900 Constipation, unspecified: Secondary | ICD-10-CM | POA: Diagnosis not present

## 2012-05-28 DIAGNOSIS — Z952 Presence of prosthetic heart valve: Secondary | ICD-10-CM | POA: Diagnosis not present

## 2012-05-28 DIAGNOSIS — D638 Anemia in other chronic diseases classified elsewhere: Secondary | ICD-10-CM | POA: Diagnosis present

## 2012-05-28 DIAGNOSIS — I4892 Unspecified atrial flutter: Secondary | ICD-10-CM | POA: Diagnosis not present

## 2012-05-28 DIAGNOSIS — I5023 Acute on chronic systolic (congestive) heart failure: Secondary | ICD-10-CM | POA: Diagnosis not present

## 2012-05-28 DIAGNOSIS — I2789 Other specified pulmonary heart diseases: Secondary | ICD-10-CM | POA: Diagnosis not present

## 2012-05-28 DIAGNOSIS — N183 Chronic kidney disease, stage 3 unspecified: Secondary | ICD-10-CM | POA: Diagnosis not present

## 2012-05-28 DIAGNOSIS — R404 Transient alteration of awareness: Secondary | ICD-10-CM | POA: Diagnosis not present

## 2012-05-28 DIAGNOSIS — R110 Nausea: Secondary | ICD-10-CM | POA: Diagnosis not present

## 2012-05-28 DIAGNOSIS — Z882 Allergy status to sulfonamides status: Secondary | ICD-10-CM | POA: Diagnosis not present

## 2012-05-28 DIAGNOSIS — I509 Heart failure, unspecified: Secondary | ICD-10-CM | POA: Diagnosis not present

## 2012-05-28 LAB — URINE CULTURE
Colony Count: NO GROWTH
Culture: NO GROWTH

## 2012-05-28 LAB — PROTIME-INR: INR: 1.49 (ref 0.00–1.49)

## 2012-05-28 NOTE — Progress Notes (Signed)
Pt dc instructions reviewed with patient, packet sent with patient, transported to SNF via EMS. Mirinda Monte, Randall An RN

## 2012-05-28 NOTE — Progress Notes (Signed)
Pt Ct sutures removed as ordered, steri strips applied, 2nd site oozing clear/serous fluid dressing appilied and Dr Cornelius Moras on floor and made aware. Olegario Emberson, Randall An RN

## 2012-05-28 NOTE — Progress Notes (Addendum)
7 Days Post-Op Procedure(s) (LRB): PERMANENT PACEMAKER INSERTION (N/A) Subjective:  Nancy Bush has no complaints this morning.  She is oriented and feels up to be discharged to rehab.  Objective: Vital signs in last 24 hours: Temp:  [97.4 F (36.3 C)-98.1 F (36.7 C)] 97.4 F (36.3 C) (10/25 0500) Pulse Rate:  [94-103] 94  (10/25 0500) Cardiac Rhythm:  [-] Ventricular paced (10/24 1938) Resp:  [18] 18  (10/25 0500) BP: (104-128)/(50-72) 114/60 mmHg (10/25 0500) SpO2:  [94 %-100 %] 100 % (10/25 0500) Weight:  [111 lb (50.349 kg)] 111 lb (50.349 kg) (10/25 0500) Intake/Output from previous day: 10/24 0701 - 10/25 0700 In: 960 [P.O.:960] Out: 951 [Urine:950; Stool:1]  General appearance: alert, cooperative and no distress Neurologic: intact Heart: regular rate and rhythm Lungs: clear to auscultation bilaterally Abdomen: soft, non-tender; bowel sounds normal; no masses,  no organomegaly Extremities: extremities normal, atraumatic, no cyanosis or edema Wound: clean and dry  Lab Results:  Basename 05/27/12 0450 05/26/12 1505  WBC 8.1 12.8*  HGB 8.7* 9.4*  HCT 27.8* 29.6*  PLT 174 189   BMET:  Basename 05/27/12 0450 05/26/12 1505  NA 141 138  K 3.5 3.8  CL 103 97  CO2 31 31  GLUCOSE 122* 179*  BUN 10 12  CREATININE 0.80 0.86  CALCIUM 8.1* 8.6    PT/INR:  Basename 05/28/12 0500  LABPROT 17.6*  INR 1.49   ABG    Component Value Date/Time   PHART 7.324* 05/19/2012 0130   HCO3 22.2 05/19/2012 0130   TCO2 18 05/19/2012 1644   ACIDBASEDEF 4.0* 05/19/2012 0130   O2SAT 93.0 05/19/2012 0130   CBG (last 3)   Basename 05/26/12 0740  GLUCAP 116*    Assessment/Plan: S/P Procedure(s) (LRB): PERMANENT PACEMAKER INSERTION (N/A)  1. CV- V paced, on Lopressor 50mg  BID 2. Pulmonary- continues IS, remains on oxygen will continue to wean at rehab facility 3. Volume Overload- will continue home dose of Lasix 4. Mental Status Changes- resolved, UA negative, CT head  negative, Urine culture pending 5. INR 1.49- will continue coumadin 3mg  daily 6. Dispo- patient is medically stable, will d/c to rehab today   LOS: 10 days    Bush, Nancy 05/28/2012   I have seen and examined the patient and agree with the assessment and plan as outlined.  Alexandre Faries H 05/28/2012 8:02 AM

## 2012-05-28 NOTE — Clinical Social Work Note (Signed)
Clinical Social Work Department CLINICAL SOCIAL WORK PLACEMENT NOTE 05/28/2012  Patient:  Nancy Bush, Nancy Bush  Account Number:  [id number] Admit date:  05/18/2012  Clinical Social Worker:  Frederico Hamman, LCSW  Date/time:  05/25/2012 12:00 N  Clinical Social Work is seeking post-discharge placement for this patient at the following level of care:   SKILLED NURSING   (*CSW will update this form in Epic as items are completed)   05/25/2012  Patient/family provided with [facility] Department of Clinical Social Work's list of facilities offering this level of care within the geographic area requested by the patient (or if unable, by the patient's family).  05/25/2012  Patient/family informed of their freedom to choose among providers that offer the needed level of care, that participate in Medicare, Medicaid or managed care program needed by the patient, have an available bed and are willing to accept the patient.  05/25/2012  Patient/family informed of [facility]' ownership interest in [facility], as well as of the fact that they are under no obligation to receive care at this facility.  PASARR submitted to EDS on 05/25/2012 PASARR number received from EDS on 05/25/2012  FL2 transmitted to all facilities in geographic area requested by pt/family on  05/25/2012 FL2 transmitted to all facilities within larger geographic area on   Patient informed that his/her managed care company has contracts with or will negotiate with  certain facilities, including the following:     Patient/family informed of bed offers received:  05/26/2012 Patient chooses bed at [facility] Physician recommends and patient chooses bed at    Patient to be transferred to [facility] on  05/28/2012 Patient to be transferred to facility by [facility]  The following physician request were entered in Epic:   Additional Comments:  Pt medically cleared for DC by  MD. Sister at bedside, plans to meet Pt at [facility] and take plants.  DC info sent to SNF, packet prepared for transport. No further CSW needs.   Frederico Hamman, LCSW [phone]

## 2012-05-28 NOTE — Discharge Summary (Signed)
I agree with the above discharge summary and plan for follow-up.  Shadi Larner H  

## 2012-05-31 ENCOUNTER — Ambulatory Visit: Payer: Medicare Other | Admitting: Thoracic Surgery (Cardiothoracic Vascular Surgery)

## 2012-05-31 ENCOUNTER — Encounter: Payer: Self-pay | Admitting: Physician Assistant

## 2012-05-31 DIAGNOSIS — I2789 Other specified pulmonary heart diseases: Secondary | ICD-10-CM | POA: Diagnosis not present

## 2012-05-31 DIAGNOSIS — R7309 Other abnormal glucose: Secondary | ICD-10-CM | POA: Diagnosis not present

## 2012-05-31 DIAGNOSIS — I4891 Unspecified atrial fibrillation: Secondary | ICD-10-CM | POA: Diagnosis not present

## 2012-05-31 DIAGNOSIS — J449 Chronic obstructive pulmonary disease, unspecified: Secondary | ICD-10-CM | POA: Diagnosis not present

## 2012-05-31 LAB — PROTIME-INR

## 2012-06-01 DIAGNOSIS — J449 Chronic obstructive pulmonary disease, unspecified: Secondary | ICD-10-CM | POA: Diagnosis not present

## 2012-06-01 DIAGNOSIS — I4891 Unspecified atrial fibrillation: Secondary | ICD-10-CM | POA: Diagnosis not present

## 2012-06-01 DIAGNOSIS — I442 Atrioventricular block, complete: Secondary | ICD-10-CM | POA: Diagnosis not present

## 2012-06-02 DIAGNOSIS — Z954 Presence of other heart-valve replacement: Secondary | ICD-10-CM | POA: Diagnosis not present

## 2012-06-02 DIAGNOSIS — Z950 Presence of cardiac pacemaker: Secondary | ICD-10-CM | POA: Diagnosis not present

## 2012-06-02 DIAGNOSIS — J449 Chronic obstructive pulmonary disease, unspecified: Secondary | ICD-10-CM | POA: Diagnosis not present

## 2012-06-02 DIAGNOSIS — I509 Heart failure, unspecified: Secondary | ICD-10-CM | POA: Diagnosis not present

## 2012-06-02 DIAGNOSIS — I4891 Unspecified atrial fibrillation: Secondary | ICD-10-CM | POA: Diagnosis not present

## 2012-06-03 ENCOUNTER — Encounter: Payer: Medicare Other | Admitting: Internal Medicine

## 2012-06-04 DIAGNOSIS — J449 Chronic obstructive pulmonary disease, unspecified: Secondary | ICD-10-CM | POA: Diagnosis not present

## 2012-06-04 DIAGNOSIS — T82897A Other specified complication of cardiac prosthetic devices, implants and grafts, initial encounter: Secondary | ICD-10-CM | POA: Diagnosis not present

## 2012-06-04 DIAGNOSIS — I4892 Unspecified atrial flutter: Secondary | ICD-10-CM | POA: Diagnosis not present

## 2012-06-04 DIAGNOSIS — R0602 Shortness of breath: Secondary | ICD-10-CM | POA: Diagnosis not present

## 2012-06-07 ENCOUNTER — Encounter: Payer: Self-pay | Admitting: Physician Assistant

## 2012-06-07 DIAGNOSIS — K5900 Constipation, unspecified: Secondary | ICD-10-CM | POA: Diagnosis not present

## 2012-06-07 DIAGNOSIS — I4892 Unspecified atrial flutter: Secondary | ICD-10-CM | POA: Diagnosis not present

## 2012-06-07 DIAGNOSIS — J449 Chronic obstructive pulmonary disease, unspecified: Secondary | ICD-10-CM | POA: Diagnosis not present

## 2012-06-07 DIAGNOSIS — R0602 Shortness of breath: Secondary | ICD-10-CM | POA: Diagnosis not present

## 2012-06-07 LAB — PROTIME-INR

## 2012-06-10 DIAGNOSIS — J449 Chronic obstructive pulmonary disease, unspecified: Secondary | ICD-10-CM | POA: Diagnosis not present

## 2012-06-10 DIAGNOSIS — R110 Nausea: Secondary | ICD-10-CM | POA: Diagnosis not present

## 2012-06-10 DIAGNOSIS — I4892 Unspecified atrial flutter: Secondary | ICD-10-CM | POA: Diagnosis not present

## 2012-06-11 ENCOUNTER — Other Ambulatory Visit: Payer: Self-pay | Admitting: Thoracic Surgery (Cardiothoracic Vascular Surgery)

## 2012-06-11 ENCOUNTER — Encounter: Payer: Self-pay | Admitting: Internal Medicine

## 2012-06-11 ENCOUNTER — Ambulatory Visit (INDEPENDENT_AMBULATORY_CARE_PROVIDER_SITE_OTHER): Payer: Medicare Other | Admitting: Internal Medicine

## 2012-06-11 VITALS — BP 107/69 | HR 73 | Ht 62.4 in | Wt 110.0 lb

## 2012-06-11 DIAGNOSIS — Z952 Presence of prosthetic heart valve: Secondary | ICD-10-CM

## 2012-06-11 DIAGNOSIS — I4891 Unspecified atrial fibrillation: Secondary | ICD-10-CM

## 2012-06-11 DIAGNOSIS — Z950 Presence of cardiac pacemaker: Secondary | ICD-10-CM

## 2012-06-11 DIAGNOSIS — I340 Nonrheumatic mitral (valve) insufficiency: Secondary | ICD-10-CM

## 2012-06-11 LAB — PACEMAKER DEVICE OBSERVATION

## 2012-06-11 NOTE — Progress Notes (Signed)
HPI Nancy Bush returns today for followup. She is a very pleasant 78 year old woman with a history of symptomatic bradycardia after cardiac surgery. She returns today for followup. Since she was discharged from the hospital, she has been at a skilled nursing facility for rehabilitation. She denies palpitations. She has mild peripheral edema. She is off of her diuretic. She does not have palpitations. She denies syncope. She does note some nausea of unclear etiology. Allergies  Allergen Reactions  . Promethazine Hcl Other (See Comments)    unknown  . Sulfonamide Derivatives Other (See Comments)    unknown  . Tequin Other (See Comments)    hallucinations      Current Outpatient Prescriptions  Medication Sig Dispense Refill  . Calcium Carbonate-Vitamin D (CALCIUM 600 + D PO) Take 1 tablet by mouth 2 (two) times daily.      . folic acid (FOLVITE) 1 MG tablet Take 2 mg by mouth daily.       Marland Kitchen levalbuterol (XOPENEX HFA) 45 MCG/ACT inhaler Inhale 1-2 puffs into the lungs every 4 (four) hours as needed.      . methotrexate (RHEUMATREX) 2.5 MG tablet Take 10 mg by mouth once a week. On Thursday      Caution:Chemotherapy. Protect from light.      . metoprolol (LOPRESSOR) 50 MG tablet Take 1 tablet (50 mg total) by mouth 2 (two) times daily.  60 tablet  1  . predniSONE (DELTASONE) 5 MG tablet Take 2.5 mg by mouth every other day.       . traMADol (ULTRAM) 50 MG tablet Take 1 tablet (50 mg total) by mouth every 4 (four) hours as needed.  30 tablet  0  . warfarin (COUMADIN) 3 MG tablet Take 1 tablet (3 mg total) by mouth as directed.  30 tablet  3     Past Medical History  Diagnosis Date  . MVP (mitral valve prolapse)   . Pulmonary hypertension     MILD TO MODERATE BY ECHO  . History of chest pain   . Mitral regurgitation     MILD  . Asthma   . Rheumatoid arthritis   . Shortness of breath   . H/O: hysterectomy   . LBBB (left bundle branch block)     per discharge note 2003  . PVC  (premature ventricular contraction)   . Atrial fibrillation   . Hypertension   . Heart murmur   . S/P mitral valve replacement with bioprosthetic valve 05/18/2012    27mm Edwards Magna Mitral pericardial bioprosthesis   . S/P Maze operation for atrial fibrillation 05/18/2012    Complete biatrial lesion set using cryothermy with oversewing of LA appendage    ROS:   All systems reviewed and negative except as noted in the HPI.   Past Surgical History  Procedure Date  . Cholecystectomy   . Ovarian cyst removal   . Hysterectomy   . Appendectomy   . Cataract extraction   . Tee without cardioversion 02/09/2012    Procedure: TRANSESOPHAGEAL ECHOCARDIOGRAM (TEE);  Surgeon: Pricilla Riffle, MD;  Location[facility] ENDOSCOPY;  Service: Cardiovascular;  Laterality: N/A;  . Maze 05/18/2012    Procedure: MAZE;  Surgeon: Purcell Nails, MD;  Location[facility] OR;  Service: Open Heart Surgery;  Laterality: Right;  . Mitral valve replacement 05/18/2012    Procedure: MINIMALLY INVASIVE MITRAL VALVE (MV) REPLACEMENT;  Surgeon: Purcell Nails, MD;  Location: [facility] OR;  Service: Open Heart Surgery;  Laterality: N/A;  No family history on file.   History   Social History  . Marital Status: Single    Spouse Name: N/A    Number of Children: N/A  . Years of Education: N/A   Occupational History  . Not on file.   Social History Main Topics  . Smoking status: Never Smoker   . Smokeless tobacco: Not on file  . Alcohol Use: No  . Drug Use: No  . Sexually Active: Not on file   Other Topics Concern  . Not on file   Social History Narrative  . No narrative on file     BP 107/69  Pulse 73  Ht 5' 2.4" (1.585 m)  Wt 110 lb (49.896 kg)  BMI 19.86 kg/m2  Physical Exam:  Well appearing NAD HEENT: Unremarkable Neck:  No JVD, no thyromegally Lungs:  Clear with no wheezes, rales, or rhonchi. HEART:  Regular rate rhythm, no murmurs, no rubs, no clicks Abd:  soft, positive bowel sounds, no  organomegally, no rebound, no guarding Ext:  2 plus pulses, no edema, no cyanosis, no clubbing Skin:  No rashes no nodules Neuro:  CN II through XII intact, motor grossly intact  DEVICE  Normal device function.  See PaceArt for details.   Assess/Plan:

## 2012-06-11 NOTE — Patient Instructions (Signed)
Your physician recommends that you schedule a follow-up appointment in: 3 months with Dr Taylor  

## 2012-06-11 NOTE — Assessment & Plan Note (Signed)
Pacemaker interrogation today demonstrates atrial flutter, not atrial fibrillation. Unfortunately, her INR has been subtherapeutic and for this reason we will not attempt to pace terminate her atrial flutter. She is back on Coumadin. My plan is to follow her up in several months. Hopefully she will spontaneously go back to rhythm. If she is in atrial flutter, we will try to pace terminate this back to sinus. If she is in atrial fibrillation, we will schedule her for DC cardioversion plus or minus an antiarrhythmic drug.

## 2012-06-11 NOTE — Assessment & Plan Note (Signed)
Her [employer] dual-chamber pacemaker is working normally. We'll plan to recheck in several months. 

## 2012-06-14 ENCOUNTER — Encounter: Payer: Self-pay | Admitting: Thoracic Surgery (Cardiothoracic Vascular Surgery)

## 2012-06-14 ENCOUNTER — Ambulatory Visit
Admission: RE | Admit: 2012-06-14 | Discharge: 2012-06-14 | Disposition: A | Discharge status: Home / Self Care | Payer: Medicare Other | Source: Ambulatory Visit | Attending: Thoracic Surgery (Cardiothoracic Vascular Surgery) | Admitting: Thoracic Surgery (Cardiothoracic Vascular Surgery)

## 2012-06-14 ENCOUNTER — Other Ambulatory Visit: Payer: Self-pay | Admitting: Thoracic Surgery (Cardiothoracic Vascular Surgery)

## 2012-06-14 ENCOUNTER — Ambulatory Visit (INDEPENDENT_AMBULATORY_CARE_PROVIDER_SITE_OTHER): Payer: Self-pay | Admitting: Thoracic Surgery (Cardiothoracic Vascular Surgery)

## 2012-06-14 ENCOUNTER — Encounter: Payer: Self-pay | Admitting: Physician Assistant

## 2012-06-14 VITALS — BP 103/61 | HR 62 | Temp 96.6°F | Resp 16 | Ht 60.0 in | Wt 109.0 lb

## 2012-06-14 DIAGNOSIS — Z952 Presence of prosthetic heart valve: Secondary | ICD-10-CM

## 2012-06-14 DIAGNOSIS — I340 Nonrheumatic mitral (valve) insufficiency: Secondary | ICD-10-CM

## 2012-06-14 DIAGNOSIS — I4891 Unspecified atrial fibrillation: Secondary | ICD-10-CM

## 2012-06-14 DIAGNOSIS — Z953 Presence of xenogenic heart valve: Secondary | ICD-10-CM

## 2012-06-14 DIAGNOSIS — R0602 Shortness of breath: Secondary | ICD-10-CM

## 2012-06-14 DIAGNOSIS — J90 Pleural effusion, not elsewhere classified: Secondary | ICD-10-CM | POA: Diagnosis not present

## 2012-06-14 DIAGNOSIS — Z9889 Other specified postprocedural states: Secondary | ICD-10-CM

## 2012-06-14 DIAGNOSIS — Z954 Presence of other heart-valve replacement: Secondary | ICD-10-CM

## 2012-06-14 DIAGNOSIS — I059 Rheumatic mitral valve disease, unspecified: Secondary | ICD-10-CM

## 2012-06-14 DIAGNOSIS — J449 Chronic obstructive pulmonary disease, unspecified: Secondary | ICD-10-CM | POA: Diagnosis not present

## 2012-06-14 DIAGNOSIS — R110 Nausea: Secondary | ICD-10-CM | POA: Diagnosis not present

## 2012-06-14 DIAGNOSIS — Z8679 Personal history of other diseases of the circulatory system: Secondary | ICD-10-CM

## 2012-06-14 DIAGNOSIS — I4892 Unspecified atrial flutter: Secondary | ICD-10-CM | POA: Diagnosis not present

## 2012-06-14 DIAGNOSIS — J9819 Other pulmonary collapse: Secondary | ICD-10-CM | POA: Diagnosis not present

## 2012-06-14 DIAGNOSIS — I313 Pericardial effusion (noninflammatory): Secondary | ICD-10-CM

## 2012-06-14 LAB — COMPREHENSIVE METABOLIC PANEL
BUN: 8 mg/dL (ref 6–23)
CO2: 30 mEq/L (ref 19–32)
Calcium: 9.2 mg/dL (ref 8.4–10.5)
Chloride: 96 mEq/L (ref 96–112)
Creat: 0.8 mg/dL (ref 0.50–1.10)
Glucose, Bld: 105 mg/dL — ABNORMAL HIGH (ref 70–99)

## 2012-06-14 LAB — CBC
HCT: 30.8 % — ABNORMAL LOW (ref 36.0–46.0)
MCV: 86.8 fL (ref 78.0–100.0)
RBC: 3.55 MIL/uL — ABNORMAL LOW (ref 3.87–5.11)
WBC: 8.9 10*3/uL (ref 4.0–10.5)

## 2012-06-14 LAB — PROTIME-INR

## 2012-06-14 MED ORDER — POTASSIUM CHLORIDE CRYS ER 10 MEQ PO TBCR: 20.0000 meq | EXTENDED_RELEASE_TABLET | Freq: Every day | ORAL | Status: DC

## 2012-06-14 MED ORDER — FUROSEMIDE 20 MG PO TABS: 80.0000 mg | ORAL_TABLET | Freq: Every day | ORAL | Status: DC

## 2012-06-14 MED ORDER — RAMIPRIL 10 MG PO CAPS: 5.0000 mg | ORAL_CAPSULE | Freq: Every day | ORAL | Status: DC

## 2012-06-14 NOTE — Patient Instructions (Signed)
Take at least 2 walks of 20 minute duration every day.  Keep both legs elevated as much as possible when not ambulating.  Eat or drink at least 3 cans of high protein dietary supplements (i.e. Ensure) every day.  Record weight every day.  Begin lasix, potassium and ramipril as prescribed.  Stop methotrexate.

## 2012-06-14 NOTE — Progress Notes (Signed)
[address]            [name] [address]          [phone]     CARDIOTHORACIC SURGERY OFFICE NOTE  Referring Provider is Rollene Rotunda, MD PCP is [name], MD   HPI:  Patient returns for followup status post minimally invasive mitral valve replacement and Maze procedure on 05/18/2012. The patient was noted to have symptomatic bradycardia both pre-and postoperatively and she ultimately underwent permanent pacemaker placement by Dr Ladona Ridgel. She otherwise recovered uneventfully and was discharged to [facility] skilled nursing facility in [place]. She was seen in followup last week by Dr. Ladona Ridgel who noted that she was in atrial flutter with controlled ventricular rate. Because she was subtherapeutic on Coumadin at the time no attempts were made to convert her back to sinus rhythm.  Patient returns to our office today for followup. She is accompanied by her sister. They're not happy with her care at [facility]. She apparently has not seen a physician nor a Advice worker. She is no longer on a diuretic therapy and she has been put back on methotrexate. She has very poor appetite and has not eating well. She has had some increase shortness of breath and considerable increased lower extremity edema. She reports mild tightness across her chest and upper abdomen. She has not had any syncope or dizzy spells. Her bowels are functioning but she states not well. She admits that she's not ambulating much. She complains that she gets very tired with physical rehabilitation sessions.   Current Outpatient Prescriptions  Medication Sig Dispense Refill  . Calcium Carbonate-Vitamin D (CALCIUM 600 + D PO) Take 1 tablet by mouth 2 (two) times daily.      . folic acid (FOLVITE) 1 MG tablet Take 2 mg by mouth daily.       Marland Kitchen levalbuterol (XOPENEX HFA) 45 MCG/ACT inhaler Inhale 1-2 puffs into the lungs every 4 (four) hours as needed.      . methotrexate  (RHEUMATREX) 2.5 MG tablet Take 10 mg by mouth once a week. On Thursday      Caution:Chemotherapy. Protect from light.      . metoprolol (LOPRESSOR) 50 MG tablet Take 1 tablet (50 mg total) by mouth 2 (two) times daily.  60 tablet  1  . predniSONE (DELTASONE) 5 MG tablet Take 2.5 mg by mouth every other day.       . traMADol (ULTRAM) 50 MG tablet Take 1 tablet (50 mg total) by mouth every 4 (four) hours as needed.  30 tablet  0  . warfarin (COUMADIN) 3 MG tablet Take 1 tablet (3 mg total) by mouth as directed.  30 tablet  3      Physical Exam:   BP 103/61  Pulse 62  Temp 96.6 F (35.9 C) (Oral)  Resp 16  Ht 5' (1.524 m)  Wt 109 lb (49.442 kg)  BMI 21.29 kg/m2  SpO2 98%  General:  Elderly and frail appearing in no acute distress  Chest:   Diminished breath sounds at both lung bases, no rales or rhonchi are noted  CV:   Regular rate and rhythm without murmur  Incisions:  Clean and dry and healing nicely  Abdomen:  Soft, nondistended, nontender  Extremities:  Severe bilateral lower extremity edema  Diagnostic Tests:  *RADIOLOGY REPORT*  Clinical Data: Post MVR October 2013, shortness of breath  CHEST - 2 VIEW  Comparison: 05/27/2012  Findings:  Left subclavian sequential transvenous pacemaker leads project at  right atrium and right ventricle.  Enlargement of cardiac silhouette post MVR.  Mediastinal contours normal.  Minimal pulmonary vascular congestion.  Bibasilar effusions and atelectasis, increased on left.  No pulmonary infiltrate or pneumothorax.  Surgical clips right upper quadrant question cholecystectomy.  Additional surgical clips right axilla.  Bones appear demineralized.  Question tiny left renal calculus.  IMPRESSION:  Bibasilar effusions atelectasis, increased on left since previous  study.  Mild enlargement of cardiac silhouette minimal pulmonary vascular  congestion post MVR and pacemaker.  Original Report Authenticated By: [name], M.D.     Impression:  The patient has developed worsening congestive heart failure with some increased shortness of breath, the small increased bilateral pleural effusions, and severe bilateral lower extremity edema. She remains in atrial flutter with controlled ventricular rate. Her appetite is poor.  Plan:  I've recommended that the patient resume taking Lasix and potassium. We will plan 80 mg Lasix twice daily for 3 days followed by 80 mg daily thereafter. We will also start her on low-dose ACE inhibitor. We will arrange for an echocardiogram to be performed sometime this week and plan to see the patient back for further followup in one week. She doesn't begin to get better she will need to go back into the hospital. I've also encouraged patient to start eating or drinking dietary supplements at least 2-3 times every day and I have reminded the patient that she needs to be up and ambulatory. All of her questions been addressed.   Salvatore Decent. Cornelius Moras, MD 06/14/2012 10:51 AM

## 2012-06-15 ENCOUNTER — Encounter (HOSPITAL_COMMUNITY): Payer: Self-pay

## 2012-06-15 ENCOUNTER — Inpatient Hospital Stay (HOSPITAL_COMMUNITY)
Admission: EM | Admit: 2012-06-15 | Discharge: 2012-06-24 | DRG: 292 | Disposition: A | Discharge status: Skilled Nursing Facility | Payer: Medicare Other | Attending: Internal Medicine | Admitting: Internal Medicine

## 2012-06-15 ENCOUNTER — Emergency Department (HOSPITAL_COMMUNITY): Payer: Medicare Other

## 2012-06-15 DIAGNOSIS — D638 Anemia in other chronic diseases classified elsewhere: Secondary | ICD-10-CM | POA: Diagnosis present

## 2012-06-15 DIAGNOSIS — Z882 Allergy status to sulfonamides status: Secondary | ICD-10-CM | POA: Diagnosis not present

## 2012-06-15 DIAGNOSIS — R0602 Shortness of breath: Secondary | ICD-10-CM | POA: Diagnosis not present

## 2012-06-15 DIAGNOSIS — M069 Rheumatoid arthritis, unspecified: Secondary | ICD-10-CM | POA: Diagnosis present

## 2012-06-15 DIAGNOSIS — Z7901 Long term (current) use of anticoagulants: Secondary | ICD-10-CM | POA: Diagnosis not present

## 2012-06-15 DIAGNOSIS — Z79899 Other long term (current) drug therapy: Secondary | ICD-10-CM | POA: Diagnosis not present

## 2012-06-15 DIAGNOSIS — Z9889 Other specified postprocedural states: Secondary | ICD-10-CM

## 2012-06-15 DIAGNOSIS — J309 Allergic rhinitis, unspecified: Secondary | ICD-10-CM | POA: Diagnosis not present

## 2012-06-15 DIAGNOSIS — R627 Adult failure to thrive: Secondary | ICD-10-CM | POA: Diagnosis present

## 2012-06-15 DIAGNOSIS — R001 Bradycardia, unspecified: Secondary | ICD-10-CM

## 2012-06-15 DIAGNOSIS — J90 Pleural effusion, not elsewhere classified: Secondary | ICD-10-CM

## 2012-06-15 DIAGNOSIS — Z7982 Long term (current) use of aspirin: Secondary | ICD-10-CM

## 2012-06-15 DIAGNOSIS — R5381 Other malaise: Secondary | ICD-10-CM | POA: Diagnosis present

## 2012-06-15 DIAGNOSIS — I129 Hypertensive chronic kidney disease with stage 1 through stage 4 chronic kidney disease, or unspecified chronic kidney disease: Secondary | ICD-10-CM | POA: Diagnosis not present

## 2012-06-15 DIAGNOSIS — I059 Rheumatic mitral valve disease, unspecified: Secondary | ICD-10-CM | POA: Diagnosis not present

## 2012-06-15 DIAGNOSIS — J4489 Other specified chronic obstructive pulmonary disease: Secondary | ICD-10-CM

## 2012-06-15 DIAGNOSIS — Z09 Encounter for follow-up examination after completed treatment for conditions other than malignant neoplasm: Secondary | ICD-10-CM | POA: Diagnosis not present

## 2012-06-15 DIAGNOSIS — Z954 Presence of other heart-valve replacement: Secondary | ICD-10-CM | POA: Diagnosis not present

## 2012-06-15 DIAGNOSIS — I079 Rheumatic tricuspid valve disease, unspecified: Secondary | ICD-10-CM | POA: Diagnosis present

## 2012-06-15 DIAGNOSIS — Z950 Presence of cardiac pacemaker: Secondary | ICD-10-CM | POA: Diagnosis not present

## 2012-06-15 DIAGNOSIS — J449 Chronic obstructive pulmonary disease, unspecified: Secondary | ICD-10-CM | POA: Diagnosis present

## 2012-06-15 DIAGNOSIS — I340 Nonrheumatic mitral (valve) insufficiency: Secondary | ICD-10-CM

## 2012-06-15 DIAGNOSIS — I4892 Unspecified atrial flutter: Secondary | ICD-10-CM

## 2012-06-15 DIAGNOSIS — J40 Bronchitis, not specified as acute or chronic: Secondary | ICD-10-CM

## 2012-06-15 DIAGNOSIS — R079 Chest pain, unspecified: Secondary | ICD-10-CM | POA: Diagnosis not present

## 2012-06-15 DIAGNOSIS — I5023 Acute on chronic systolic (congestive) heart failure: Secondary | ICD-10-CM | POA: Diagnosis not present

## 2012-06-15 DIAGNOSIS — I509 Heart failure, unspecified: Secondary | ICD-10-CM | POA: Diagnosis present

## 2012-06-15 DIAGNOSIS — J329 Chronic sinusitis, unspecified: Secondary | ICD-10-CM

## 2012-06-15 DIAGNOSIS — I369 Nonrheumatic tricuspid valve disorder, unspecified: Secondary | ICD-10-CM | POA: Diagnosis not present

## 2012-06-15 DIAGNOSIS — N189 Chronic kidney disease, unspecified: Secondary | ICD-10-CM

## 2012-06-15 DIAGNOSIS — Z888 Allergy status to other drugs, medicaments and biological substances status: Secondary | ICD-10-CM

## 2012-06-15 DIAGNOSIS — J9819 Other pulmonary collapse: Secondary | ICD-10-CM | POA: Diagnosis not present

## 2012-06-15 DIAGNOSIS — N183 Chronic kidney disease, stage 3 unspecified: Secondary | ICD-10-CM

## 2012-06-15 DIAGNOSIS — I4891 Unspecified atrial fibrillation: Secondary | ICD-10-CM

## 2012-06-15 DIAGNOSIS — N185 Chronic kidney disease, stage 5: Secondary | ICD-10-CM | POA: Diagnosis not present

## 2012-06-15 DIAGNOSIS — R279 Unspecified lack of coordination: Secondary | ICD-10-CM | POA: Diagnosis not present

## 2012-06-15 DIAGNOSIS — Z952 Presence of prosthetic heart valve: Secondary | ICD-10-CM | POA: Diagnosis not present

## 2012-06-15 DIAGNOSIS — Z953 Presence of xenogenic heart valve: Secondary | ICD-10-CM

## 2012-06-15 DIAGNOSIS — I080 Rheumatic disorders of both mitral and aortic valves: Secondary | ICD-10-CM

## 2012-06-15 DIAGNOSIS — R9431 Abnormal electrocardiogram [ECG] [EKG]: Secondary | ICD-10-CM

## 2012-06-15 DIAGNOSIS — R404 Transient alteration of awareness: Secondary | ICD-10-CM | POA: Diagnosis not present

## 2012-06-15 DIAGNOSIS — I5033 Acute on chronic diastolic (congestive) heart failure: Principal | ICD-10-CM

## 2012-06-15 DIAGNOSIS — R5383 Other fatigue: Secondary | ICD-10-CM

## 2012-06-15 DIAGNOSIS — Z8679 Personal history of other diseases of the circulatory system: Secondary | ICD-10-CM

## 2012-06-15 DIAGNOSIS — I2789 Other specified pulmonary heart diseases: Secondary | ICD-10-CM

## 2012-06-15 DIAGNOSIS — R110 Nausea: Secondary | ICD-10-CM | POA: Diagnosis not present

## 2012-06-15 DIAGNOSIS — M6281 Muscle weakness (generalized): Secondary | ICD-10-CM | POA: Diagnosis not present

## 2012-06-15 HISTORY — DX: Presence of cardiac pacemaker: Z95.0

## 2012-06-15 HISTORY — DX: Heart failure, unspecified: I50.9

## 2012-06-15 HISTORY — DX: Chronic obstructive pulmonary disease, unspecified: J44.9

## 2012-06-15 LAB — CBC
MCH: 28.4 pg (ref 26.0–34.0)
MCHC: 31.5 g/dL (ref 30.0–36.0)
Platelets: 237 10*3/uL (ref 150–400)
RBC: 3.73 MIL/uL — ABNORMAL LOW (ref 3.87–5.11)
RDW: 16.4 % — ABNORMAL HIGH (ref 11.5–15.5)

## 2012-06-15 LAB — COMPREHENSIVE METABOLIC PANEL
ALT: 11 U/L (ref 0–35)
AST: 19 U/L (ref 0–37)
Albumin: 3.1 g/dL — ABNORMAL LOW (ref 3.5–5.2)
Calcium: 9.2 mg/dL (ref 8.4–10.5)
Creatinine, Ser: 0.97 mg/dL (ref 0.50–1.10)
Sodium: 130 mEq/L — ABNORMAL LOW (ref 135–145)
Total Protein: 6.2 g/dL (ref 6.0–8.3)

## 2012-06-15 LAB — URINALYSIS, ROUTINE W REFLEX MICROSCOPIC
Bilirubin Urine: NEGATIVE
Hgb urine dipstick: NEGATIVE
Nitrite: NEGATIVE
Specific Gravity, Urine: 1.013 (ref 1.005–1.030)
pH: 5 (ref 5.0–8.0)

## 2012-06-15 LAB — PREALBUMIN: Prealbumin: 7 mg/dL — ABNORMAL LOW (ref 17.0–34.0)

## 2012-06-15 LAB — PRO B NATRIURETIC PEPTIDE: Pro B Natriuretic peptide (BNP): 2390 pg/mL — ABNORMAL HIGH (ref 0–450)

## 2012-06-15 LAB — MRSA PCR SCREENING: MRSA by PCR: NEGATIVE

## 2012-06-15 LAB — TROPONIN I: Troponin I: <0.3 ng/mL (ref <0.30)

## 2012-06-15 LAB — URINE MICROSCOPIC-ADD ON

## 2012-06-15 MED ORDER — LISINOPRIL 10 MG PO TABS
10.0000 mg | ORAL_TABLET | Freq: Every day | ORAL | Status: DC
  Administered 2012-06-16: 10 mg via ORAL
  Filled 2012-06-15: qty 1

## 2012-06-15 MED ORDER — CALCIUM CARBONATE-VITAMIN D 500-200 MG-UNIT PO TABS
1.0000 | ORAL_TABLET | Freq: Two times a day (BID) | ORAL | Status: DC
  Administered 2012-06-15 – 2012-06-23 (×12): 1 via ORAL
  Filled 2012-06-15 (×19): qty 1

## 2012-06-15 MED ORDER — METOPROLOL TARTRATE 50 MG PO TABS
75.0000 mg | ORAL_TABLET | Freq: Two times a day (BID) | ORAL | Status: DC
  Administered 2012-06-16 – 2012-06-24 (×17): 75 mg via ORAL
  Filled 2012-06-15 (×19): qty 1

## 2012-06-15 MED ORDER — FOLIC ACID 1 MG PO TABS
2.0000 mg | ORAL_TABLET | Freq: Every day | ORAL | Status: DC
  Administered 2012-06-16 – 2012-06-24 (×8): 2 mg via ORAL
  Filled 2012-06-15 (×9): qty 2

## 2012-06-15 MED ORDER — SODIUM CHLORIDE 0.9 % IV SOLN: 250.0000 mL | INTRAVENOUS | Status: DC | PRN

## 2012-06-15 MED ORDER — MAGNESIUM HYDROXIDE 400 MG/5ML PO SUSP: 30.0000 mL | Freq: Every day | ORAL | Status: DC | PRN

## 2012-06-15 MED ORDER — DOCUSATE SODIUM 100 MG PO CAPS
100.0000 mg | ORAL_CAPSULE | Freq: Two times a day (BID) | ORAL | Status: DC
  Administered 2012-06-16 – 2012-06-21 (×8): 100 mg via ORAL
  Filled 2012-06-15 (×19): qty 1

## 2012-06-15 MED ORDER — ROFLUMILAST 500 MCG PO TABS
500.0000 ug | ORAL_TABLET | Freq: Every day | ORAL | Status: DC
  Administered 2012-06-16 – 2012-06-24 (×9): 500 ug via ORAL
  Filled 2012-06-15 (×9): qty 1

## 2012-06-15 MED ORDER — SODIUM CHLORIDE 0.9 % IV BOLUS (SEPSIS)
500.0000 mL | Freq: Once | INTRAVENOUS | Status: AC
  Administered 2012-06-15: 500 mL via INTRAVENOUS

## 2012-06-15 MED ORDER — ONDANSETRON HCL 4 MG/2ML IJ SOLN: 4.0000 mg | Freq: Four times a day (QID) | INTRAMUSCULAR | Status: DC | PRN

## 2012-06-15 MED ORDER — ASPIRIN EC 81 MG PO TBEC
81.0000 mg | DELAYED_RELEASE_TABLET | Freq: Every day | ORAL | Status: DC
  Administered 2012-06-18 – 2012-06-22 (×3): 81 mg via ORAL
  Filled 2012-06-15 (×9): qty 1

## 2012-06-15 MED ORDER — PREDNISONE 2.5 MG PO TABS
2.5000 mg | ORAL_TABLET | ORAL | Status: DC
  Administered 2012-06-16 – 2012-06-24 (×5): 2.5 mg via ORAL
  Filled 2012-06-15 (×5): qty 1

## 2012-06-15 MED ORDER — WARFARIN - PHARMACIST DOSING INPATIENT
Freq: Every day | Status: DC
  Administered 2012-06-18 – 2012-06-21 (×4)

## 2012-06-15 MED ORDER — ONDANSETRON HCL 4 MG PO TABS: 4.0000 mg | ORAL_TABLET | Freq: Four times a day (QID) | ORAL | Status: DC | PRN

## 2012-06-15 MED ORDER — LEVALBUTEROL TARTRATE 45 MCG/ACT IN AERO
2.0000 | INHALATION_SPRAY | Freq: Four times a day (QID) | RESPIRATORY_TRACT | Status: DC | PRN
  Administered 2012-06-19: 2 via RESPIRATORY_TRACT
  Filled 2012-06-15: qty 15

## 2012-06-15 MED ORDER — SODIUM CHLORIDE 0.9 % IJ SOLN
3.0000 mL | INTRAMUSCULAR | Status: DC | PRN
  Administered 2012-06-23: 3 mL via INTRAVENOUS

## 2012-06-15 MED ORDER — CALCIUM CARBONATE-VITAMIN D 600-400 MG-UNIT PO TABS: 1.0000 | ORAL_TABLET | Freq: Two times a day (BID) | ORAL | Status: DC

## 2012-06-15 MED ORDER — FUROSEMIDE 10 MG/ML IJ SOLN
40.0000 mg | Freq: Two times a day (BID) | INTRAMUSCULAR | Status: DC
  Administered 2012-06-16 (×2): 40 mg via INTRAVENOUS
  Filled 2012-06-15 (×4): qty 4

## 2012-06-15 MED ORDER — SODIUM CHLORIDE 0.9 % IJ SOLN
3.0000 mL | Freq: Two times a day (BID) | INTRAMUSCULAR | Status: DC
  Administered 2012-06-15 – 2012-06-24 (×12): 3 mL via INTRAVENOUS

## 2012-06-15 MED ORDER — SODIUM CHLORIDE 0.9 % IJ SOLN
3.0000 mL | Freq: Two times a day (BID) | INTRAMUSCULAR | Status: DC
  Administered 2012-06-16 – 2012-06-23 (×8): 3 mL via INTRAVENOUS

## 2012-06-15 MED ORDER — POTASSIUM CHLORIDE CRYS ER 20 MEQ PO TBCR
20.0000 meq | EXTENDED_RELEASE_TABLET | Freq: Every day | ORAL | Status: DC
  Administered 2012-06-16 – 2012-06-17 (×2): 20 meq via ORAL
  Filled 2012-06-15 (×2): qty 1

## 2012-06-15 MED ORDER — FUROSEMIDE 10 MG/ML IJ SOLN
40.0000 mg | Freq: Once | INTRAMUSCULAR | Status: AC
  Administered 2012-06-15: 40 mg via INTRAVENOUS
  Filled 2012-06-15: qty 4

## 2012-06-15 NOTE — ED Notes (Signed)
Per [facility], pt has requested that if she gets admitted that she would like to get admitted by Dr. Tenny Craw.

## 2012-06-15 NOTE — ED Provider Notes (Signed)
History     CSN: [id number]  Arrival date & time 06/15/12  1624   First MD Initiated Contact with Patient 06/15/12 1625      Chief Complaint  Patient presents with  . Weakness    (Consider location/radiation/quality/duration/timing/severity/associated sxs/prior treatment) HPI Pt presents with generalized weakness and mild shortness of breath with lower exremity edema.  She was admitted in October for mitral valve replacement and pacemaker placement.  Since that time has been feeling progressively weak and more short of breath.  She has had lower extremity edema in the past and states that usually the lasix takes care of it.  No fever or cough.  No chest pain.  Denies abdominal pain, vomiting/diarrhea or urinary symptoms.  She has had decreased appetite.  She has f/u with cardiology and cardiothoracic surgery and plan is for repeat echo this week.  There are no other associated systemic symptoms, there are no other alleviating or modifying factors.   Past Medical History  Diagnosis Date  . MVP (mitral valve prolapse)   . Pulmonary hypertension     MILD TO MODERATE BY ECHO  . History of chest pain   . Mitral regurgitation     MILD  . Asthma   . Rheumatoid arthritis   . Shortness of breath   . H/O: hysterectomy   . LBBB (left bundle branch block)     per discharge note 2003  . PVC (premature ventricular contraction)   . Atrial fibrillation   . Hypertension   . Heart murmur   . S/P mitral valve replacement with bioprosthetic valve 05/18/2012    27mm Edwards Magna Mitral pericardial bioprosthesis   . S/P Maze operation for atrial fibrillation 05/18/2012    Complete biatrial lesion set using cryothermy with oversewing of LA appendage  . COPD (chronic obstructive pulmonary disease)   . CHF (congestive heart failure)   . Pacemaker     Past Surgical History  Procedure Date  . Cholecystectomy   . Ovarian cyst removal   . Hysterectomy   . Appendectomy   . Cataract extraction    . Tee without cardioversion 02/09/2012    Procedure: TRANSESOPHAGEAL ECHOCARDIOGRAM (TEE);  Surgeon: Pricilla Riffle, MD;  Location: [facility] ENDOSCOPY;  Service: Cardiovascular;  Laterality: N/A;  . Maze 05/18/2012    Procedure: MAZE;  Surgeon: Purcell Nails, MD;  Location: [facility] OR;  Service: Open Heart Surgery;  Laterality: Right;  . Mitral valve replacement 05/18/2012    Procedure: MINIMALLY INVASIVE MITRAL VALVE (MV) REPLACEMENT;  Surgeon: Purcell Nails, MD;  Location: [facility] OR;  Service: Open Heart Surgery;  Laterality: N/A;  . Pacemaker insertion   . Abdominal hysterectomy     History reviewed. No pertinent family history.  History  Substance Use Topics  . Smoking status: Never Smoker   . Smokeless tobacco: Not on file  . Alcohol Use: No    OB History    Grav Para Term Preterm Abortions TAB SAB Ect Mult Living                  Review of Systems ROS reviewed and all otherwise negative except for mentioned in HPI  Allergies  Promethazine hcl; Sulfonamide derivatives; and Tequin  Home Medications   No current outpatient prescriptions on file.  BP 103/45  Pulse 74  Temp 97.7 F (36.5 C) (Oral)  Resp 20  Ht 5' (1.524 m)  Wt 109 lb 12.8 oz (49.805 kg)  BMI 21.44 kg/m2  SpO2 95% Vitals reviewed  Physical Exam Physical Examination: General appearance - alert, tired appearaing appearing, and in no distress Mental status - alert, oriented to person, place, and time Eyes - no conjunctival injection, no scleral icterus Mouth - mucous membranes moist, pharynx normal without lesions Chest - clear to auscultation, no wheezes, rales or rhonchi, symmetric air entry, no increased respiratory effort, decreased breath sounds at bases bilaterally Heart - normal rate, regular rhythm, normal S1, S2, no murmurs, rubs, clicks or gallops Abdomen - soft, nontender, nondistended, no masses or organomegaly Extremities - peripheral pulses normal, 2+ symmetric pitteng  Edema of lower extremities, no  clubbing or cyanosis Skin - normal coloration and turgor, no rashes  ED Course  Procedures (including critical care time)  Date: 06/15/2012  Rate: 70  Rhythm: atrial fibrillation  QRS Axis: left  Intervals: indeterminate  ST/T Wave abnormalities: nonspecific ST/T changes  Conduction Disutrbances:none  Narrative Interpretation:   Old EKG Reviewed: unchanged compared to prior, except on prior pacer spikes more evident  8:08 PM d/w Dr. Conley Rolls, triad- pt to be admitted to [facility] team 10, telemetry  Labs Reviewed  CBC - Abnormal; Notable for the following:    RBC 3.73 (*)     Hemoglobin 10.6 (*)     HCT 33.7 (*)     RDW 16.4 (*)     All other components within normal limits  COMPREHENSIVE METABOLIC PANEL - Abnormal; Notable for the following:    Sodium 130 (*)     Chloride 92 (*)     Glucose, Bld 137 (*)     Albumin 3.1 (*)     GFR calc non Af Amer 53 (*)     GFR calc Af Amer 61 (*)     All other components within normal limits  URINALYSIS, ROUTINE W REFLEX MICROSCOPIC - Abnormal; Notable for the following:    Leukocytes, UA TRACE (*)     All other components within normal limits  PRO B NATRIURETIC PEPTIDE - Abnormal; Notable for the following:    Pro B Natriuretic peptide (BNP) 2390.0 (*)     All other components within normal limits  URINE MICROSCOPIC-ADD ON - Abnormal; Notable for the following:    Squamous Epithelial / LPF FEW (*)     Casts HYALINE CASTS (*)     All other components within normal limits  PROTIME-INR - Abnormal; Notable for the following:    Prothrombin Time 33.4 (*)     INR 3.54 (*)     All other components within normal limits  PROTIME-INR - Abnormal; Notable for the following:    Prothrombin Time 27.6 (*)     INR 2.73 (*)     All other components within normal limits  TROPONIN I  MRSA PCR SCREENING  TSH  PROTIME-INR   Dg Chest 2 View  06/15/2012  *RADIOLOGY REPORT*  Clinical Data: Left chest pain, weakness  CHEST - 2 VIEW  Comparison: Chest x-ray  of 06/14/2012  Findings: There has been some increase in opacities at the lung bases most consistent with slight increase in effusions and basilar atelectasis.  Mild cardiomegaly is stable.  A dual lead permanent pacemaker remains.  The bones are osteopenic.  IMPRESSION: Some increase in volume of small effusions and basilar atelectasis bilaterally.   Original Report Authenticated By: [name], M.D.      1. Acute on chronic diastolic heart failure   2. Long term (current) use of anticoagulants   3. Shortness of breath  MDM  Pt presenting with generalized weakness and sob.  BNP elevated, with increased bilateral pleural effusion and lower extremity edema.  Pt has not gotten back to her baseline since surgery 10/15.  D/w triad for admission, given IV lasix in the ED.        Ethelda Chick, MD 06/16/12 1758

## 2012-06-15 NOTE — ED Notes (Signed)
Pt undressed, in gown, on monitor, continuous pulse oximetry and blood pressure cuff; EKG performed; warm blankets given 

## 2012-06-15 NOTE — ED Notes (Signed)
Per EMS, pt has had increased weakness and shortness of breath since a mitral valve replacement since 10/15; pt also had a pacemaker placed; pt also had +2 pitting edema to ankles bilaterally; hx COPD; VSS; from [facility]

## 2012-06-15 NOTE — ED Notes (Signed)
Patient transported to X-ray 

## 2012-06-15 NOTE — ED Notes (Signed)
Pt's sister given update on the telephone; pt gave permission to give update and spoke to sister on the phone.

## 2012-06-15 NOTE — Progress Notes (Signed)
ANTICOAGULATION CONSULT NOTE - Initial Consult  Pharmacy Consult for Coumadin Indication: atrial fibrillation  Allergies  Allergen Reactions  . Promethazine Hcl Other (See Comments)    unknown  . Sulfonamide Derivatives Other (See Comments)    unknown  . Tequin Other (See Comments)    hallucinations       Vital Signs: Temp: 97.5 F (36.4 C) (11/12 1833) Temp src: Oral (11/12 1833) BP: 132/74 mmHg (11/12 1904) Pulse Rate: 70  (11/12 1904)  Labs:  Basename 06/15/12 1939 06/15/12 1714 06/15/12 1708 06/14/12 1400  HGB -- -- 10.6* 10.5*  HCT -- -- 33.7* 30.8*  PLT -- -- 237 245  APTT -- -- -- --  LABPROT 33.4* -- -- --  INR 3.54* -- -- --  HEPARINUNFRC -- -- -- --  CREATININE -- -- 0.97 0.80  CKTOTAL -- -- -- --  CKMB -- -- -- --  TROPONINI -- <0.30 -- --    The CrCl is unknown because both a height and weight (above a minimum accepted value) are required for this calculation.   Medical History: Past Medical History  Diagnosis Date  . MVP (mitral valve prolapse)   . Pulmonary hypertension     MILD TO MODERATE BY ECHO  . History of chest pain   . Mitral regurgitation     MILD  . Asthma   . Rheumatoid arthritis   . Shortness of breath   . H/O: hysterectomy   . LBBB (left bundle branch block)     per discharge note 2003  . PVC (premature ventricular contraction)   . Atrial fibrillation   . Hypertension   . Heart murmur   . S/P mitral valve replacement with bioprosthetic valve 05/18/2012    27mm Edwards Magna Mitral pericardial bioprosthesis   . S/P Maze operation for atrial fibrillation 05/18/2012    Complete biatrial lesion set using cryothermy with oversewing of LA appendage    Medications:  See med rec  Assessment: Patient is an 78 y.o F on coumadin PTA for Afib.  She recently had her mitral valve replaced with [facility] Mitral Bioprosthetic Tissue Valve on 05/21/12. INR is supratherapeutic today at 3.54.  Patient was on coumadin 3mg  daily  except 1mg  on Fridays at [facility].  Staff at [facility] facility reported that her coumadin has been on hold since Monday d/t elevated INR value.  Goal of Therapy:  INR 2-3     Plan:  1) Hold coumadin today  2) f/u with INR in AM and resume if/when appropriate  Londen Bok P 06/15/2012,9:02 PM

## 2012-06-15 NOTE — H&P (Signed)
[facility] History and Physical  DELMI FULFER ZOX:[id number] DOB: [birth year]    PCP:   [name], MD   Chief Complaint: SOB, DOE, and bilateral pedal edema.  HPI: Nancy Bush is an 78 y.o. female with recent bioprosthetic MVR, afib s/p MAZE, ppm, Hx of RA, asthma, presents to the ER with increase orthopnea, bilateral pedal edema, and increased pleural effusion by CXR.  She saw Dr Cornelius Moras in the office, and was recommended resuming her Lasix and obtain a cardiac echo with follow up visit in a week.  She presents to the ER because of her increase shortness of breath.  She has no chest pain.  She has afib, with rate control, and is on anticoagulation.  Evaluation in the ER included an EKG with LBBB, rate controlled afib, normal renal fx tests, with Na of 130, ProBNP of 2500 (not a significant change), INR of 3.5, with normal WBC and Hb of 10.6 grams/DL.  Her CXR showed increased pleural effusions especially on the left.  Hospitalist was asked to admit her for volume overload.  Rewiew of Systems:  Constitutional: Negative for malaise, fever and chills. No significant weight loss or weight gain Eyes: Negative for eye pain, redness and discharge, diplopia, visual changes, or flashes of light. ENMT: Negative for ear pain, hoarseness, nasal congestion, sinus pressure and sore throat. No headaches; tinnitus, drooling, or problem swallowing. Cardiovascular: Negative for chest pain, palpitations, diaphoresis,  Respiratory: Negative for cough, hemoptysis, wheezing and stridor. No pleuritic chestpain. Gastrointestinal: Negative for nausea, vomiting, diarrhea, constipation, abdominal pain, melena, blood in stool, hematemesis, jaundice and rectal bleeding.    Genitourinary: Negative for frequency, dysuria, incontinence,flank pain and hematuria; Musculoskeletal: Negative for back pain and neck pain. Negative for  trauma.;  Skin: . Negative for pruritus, rash, abrasions, bruising and  skin lesion.; ulcerations Neuro: Negative for headache, lightheadedness and neck stiffness. Negative for weakness, altered level of consciousness , altered mental status, extremity weakness, burning feet, involuntary movement, seizure and syncope.  Psych: negative for anxiety, depression, insomnia, tearfulness, panic attacks, hallucinations, paranoia, suicidal or homicidal ideation    Past Medical History  Diagnosis Date  . MVP (mitral valve prolapse)   . Pulmonary hypertension     MILD TO MODERATE BY ECHO  . History of chest pain   . Mitral regurgitation     MILD  . Asthma   . Rheumatoid arthritis   . Shortness of breath   . H/O: hysterectomy   . LBBB (left bundle branch block)     per discharge note 2003  . PVC (premature ventricular contraction)   . Atrial fibrillation   . Hypertension   . Heart murmur   . S/P mitral valve replacement with bioprosthetic valve 05/18/2012    27mm Edwards Magna Mitral pericardial bioprosthesis   . S/P Maze operation for atrial fibrillation 05/18/2012    Complete biatrial lesion set using cryothermy with oversewing of LA appendage    Past Surgical History  Procedure Date  . Cholecystectomy   . Ovarian cyst removal   . Hysterectomy   . Appendectomy   . Cataract extraction   . Tee without cardioversion 02/09/2012    Procedure: TRANSESOPHAGEAL ECHOCARDIOGRAM (TEE);  Surgeon: Pricilla Riffle, MD;  Location: [facility] ENDOSCOPY;  Service: Cardiovascular;  Laterality: N/A;  . Maze 05/18/2012    Procedure: MAZE;  Surgeon: Purcell Nails, MD;  Location: [facility] OR;  Service: Open Heart Surgery;  Laterality: Right;  . Mitral valve replacement 05/18/2012    Procedure:  MINIMALLY INVASIVE MITRAL VALVE (MV) REPLACEMENT;  Surgeon: Purcell Nails, MD;  Location: [facility] OR;  Service: Open Heart Surgery;  Laterality: N/A;  . Pacemaker insertion     Medications:  HOME MEDS: Prior to Admission medications   Medication Sig Start Date End Date Taking? Authorizing Provider    Calcium Carbonate-Vitamin D (CALTRATE 600+D) 600-400 MG-UNIT per tablet Take 1 tablet by mouth 2 (two) times daily.   Yes Historical Provider, MD  docusate sodium (COLACE) 100 MG capsule Take 100 mg by mouth 2 (two) times daily.   Yes Historical Provider, MD  folic acid (FOLVITE) 1 MG tablet Take 2 mg by mouth daily.    Yes Historical Provider, MD  furosemide (LASIX) 40 MG tablet Take 40 mg by mouth 2 (two) times daily.   Yes Historical Provider, MD  levalbuterol [facility] HFA) 45 MCG/ACT inhaler Inhale 2 puffs into the lungs every 6 (six) hours as needed. For wheezing   Yes Historical Provider, MD  metoprolol tartrate (LOPRESSOR) 25 MG tablet Take 75 mg by mouth 2 (two) times daily.   Yes Historical Provider, MD  ondansetron (ZOFRAN) 4 MG tablet Take 4 mg by mouth every 6 (six) hours as needed. For nausea   Yes Historical Provider, MD  potassium chloride SA (K-DUR,KLOR-CON) 20 MEQ tablet Take 20 mEq by mouth See admin instructions. Potassium twice daily for 3 days (stop date 06/17/12), then once daily (start on 06/18/12)   Yes Historical Provider, MD  predniSONE (DELTASONE) 2.5 MG tablet Take 2.5 mg by mouth every other day.   Yes Historical Provider, MD  roflumilast (DALIRESP) 500 MCG TABS tablet Take 500 mcg by mouth daily.   Yes Historical Provider, MD  traMADol (ULTRAM) 50 MG tablet Take 50 mg by mouth every 4 (four) hours as needed. For pain   Yes Historical Provider, MD  warfarin (COUMADIN) 1 MG tablet Take 1-3 mg by mouth daily. Takes 3mg  daily except 1mg  on Fridays   Yes Historical Provider, MD     Allergies:  Allergies  Allergen Reactions  . Promethazine Hcl Other (See Comments)    unknown  . Sulfonamide Derivatives Other (See Comments)    unknown  . Tequin Other (See Comments)    hallucinations     Social History:   reports that she has never smoked. She does not have any smokeless tobacco history on file. She reports that she does not drink alcohol or use illicit  drugs.  Family History: No family history on file.   Physical Exam: Filed Vitals:   06/15/12 1857 06/15/12 1904 06/15/12 2122 06/15/12 2212  BP: 132/74 132/74 131/60 79/60  Pulse: 70 70 74 70  Temp:   97.6 F (36.4 C) 97.9 F (36.6 C)  TempSrc:   Oral Oral  Resp: 19 19 14 16   Height:    5' (1.524 m)  Weight:    49.805 kg (109 lb 12.8 oz)  SpO2: 100% 100% 100% 100%   Blood pressure 79/60, pulse 70, temperature 97.9 F (36.6 C), temperature source Oral, resp. rate 16, height 5' (1.524 m), weight 49.805 kg (109 lb 12.8 oz), SpO2 100.00%.  GEN:  Pleasant  patient lying in the stretcher in no acute distress; cooperative with exam. PSYCH:  alert and oriented x4; does not appear anxious or depressed; affect is appropriate. HEENT: Mucous membranes pink and anicteric; PERRLA; EOM intact; no cervical lymphadenopathy nor thyromegaly or carotid bruit; 7cm JVD; There were no stridor. Neck is very supple. Breasts:: Not examined  CHEST WALL: No tenderness CHEST: Normal respiration, clear to auscultation bilaterally.  HEART: Regular rate and rhythm.  There are no murmur, rub, or gallops.   BACK: No kyphosis or scoliosis; no CVA tenderness ABDOMEN: soft and non-tender; no masses, no organomegaly, normal abdominal bowel sounds; no pannus; no intertriginous candida. There is no rebound and no distention. Rectal Exam: Not done EXTREMITIES: No bone or joint deformity; age-appropriate arthropathy of the hands and knees; 2+ edema bilaterally. no ulcerations.  There is no calf tenderness. Genitalia: not examined PULSES: 2+ and symmetric SKIN: Normal hydration no rash or ulceration CNS: Cranial nerves 2-12 grossly intact no focal lateralizing neurologic deficit.  Speech is fluent; uvula elevated with phonation, facial symmetry and tongue midline. DTR are normal bilaterally, cerebella exam is intact, barbinski is negative and strengths are equaled bilaterally.  No sensory loss.   Labs on Admission:    Basic Metabolic Panel:  Lab 06/15/12 1610 06/14/12 1400  NA 130* 131*  K 4.6 4.7  CL 92* 96  CO2 30 30  GLUCOSE 137* 105*  BUN 10 8  CREATININE 0.97 0.80  CALCIUM 9.2 9.2  MG -- --  PHOS -- --   Liver Function Tests:  Lab 06/15/12 1708 06/14/12 1400  AST 19 19  ALT 11 12  ALKPHOS 99 77  BILITOT 0.4 0.5  PROT 6.2 5.7*  ALBUMIN 3.1* 3.2*   No results found for this basename: LIPASE:5,AMYLASE:5 in the last 168 hours No results found for this basename: AMMONIA:5 in the last 168 hours CBC:  Lab 06/15/12 1708 06/14/12 1400  WBC 9.4 8.9  NEUTROABS -- --  HGB 10.6* 10.5*  HCT 33.7* 30.8*  MCV 90.3 86.8  PLT 237 245   Cardiac Enzymes:  Lab 06/15/12 1714  CKTOTAL --  CKMB --  CKMBINDEX --  TROPONINI <0.30    CBG: No results found for this basename: GLUCAP:5 in the last 168 hours   Radiological Exams on Admission: Dg Chest 2 View  06/15/2012  *RADIOLOGY REPORT*  Clinical Data: Left chest pain, weakness  CHEST - 2 VIEW  Comparison: Chest x-ray of 06/14/2012  Findings: There has been some increase in opacities at the lung bases most consistent with slight increase in effusions and basilar atelectasis.  Mild cardiomegaly is stable.  A dual lead permanent pacemaker remains.  The bones are osteopenic.  IMPRESSION: Some increase in volume of small effusions and basilar atelectasis bilaterally.   Original Report Authenticated By: [name], M.D.    Dg Chest 2 View  06/14/2012  *RADIOLOGY REPORT*  Clinical Data: Post MVR October 2013, shortness of breath  CHEST - 2 VIEW  Comparison: 05/27/2012  Findings: Left subclavian sequential transvenous pacemaker leads project at right atrium and right ventricle. Enlargement of cardiac silhouette post MVR. Mediastinal contours normal. Minimal pulmonary vascular congestion. Bibasilar effusions and atelectasis, increased on left. No pulmonary infiltrate or pneumothorax. Surgical clips right upper quadrant question cholecystectomy.  Additional surgical clips right axilla. Bones appear demineralized. Question tiny left renal calculus.  IMPRESSION: Bibasilar effusions atelectasis, increased on left since previous study. Mild enlargement of cardiac silhouette minimal pulmonary vascular congestion post MVR and pacemaker.   Original Report Authenticated By: [name], M.D.     EKG: Independently reviewed. Afib, LBBB. Rate controlled.   Assessment/Plan Present on Admission:  . Shortness of breath . [employer] investment [employer] . COPD . Atrial fibrillation . Acute on chronic diastolic heart failure   PLAN:  She is clearly volume overload.  Given normal creatinine, I  will give her Lasix 40mg  IV BID.  Will obtain ECHO.  She is to be anticoagulated and her meds are continued.  Will add Lisinopril at 10mg  per day to her regimen as well.  She has been on low dose steroid, and will continue.  There is no evidence of adrenal crisis.  She is stable, full code, and will be admitted to [facility] service.  Other plans as per orders.  Code Status: FULL Unk Lightning, MD. [facility] Pager [phone] 7pm to 7am.  06/15/2012, 10:40 PM

## 2012-06-16 ENCOUNTER — Other Ambulatory Visit (HOSPITAL_COMMUNITY): Payer: Medicare Other

## 2012-06-16 ENCOUNTER — Ambulatory Visit: Payer: Medicare Other | Admitting: Cardiology

## 2012-06-16 DIAGNOSIS — I059 Rheumatic mitral valve disease, unspecified: Secondary | ICD-10-CM

## 2012-06-16 LAB — TSH: TSH: 1.385 u[IU]/mL (ref 0.350–4.500)

## 2012-06-16 MED ORDER — WARFARIN SODIUM 1 MG PO TABS
1.0000 mg | ORAL_TABLET | Freq: Once | ORAL | Status: AC
  Administered 2012-06-16: 1 mg via ORAL
  Filled 2012-06-16: qty 1

## 2012-06-16 MED ORDER — ENSURE PUDDING PO PUDG
1.0000 | Freq: Three times a day (TID) | ORAL | Status: DC
  Administered 2012-06-16 – 2012-06-19 (×9): 1 via ORAL

## 2012-06-16 MED ORDER — LORAZEPAM 2 MG/ML IJ SOLN
0.5000 mg | Freq: Once | INTRAMUSCULAR | Status: DC
  Filled 2012-06-16: qty 1

## 2012-06-16 NOTE — Progress Notes (Signed)
Pt stating that she is going to leave. Refusing to wear heart monitor. Continues to call 911 to find someone to pick her up. MD notified. Family contacted. Will continue to monitor

## 2012-06-16 NOTE — Progress Notes (Signed)
[facility] PROGRESS NOTE  Nancy Bush ZOX:[id number] DOB: [birth year] DOA: 06/15/2012 PCP: [name], MD  Assessment/Plan: Acute on chronic diastolic CHF -Ejection fraction 60-65% -Continue furosemide 40 mg IV every 12 -Morning BMP -Hold lisinopril for now as ejection fraction is preserved and patient's blood pressure is marginal -Fluid restriction, daily weights -neg 782cc for the admission Atrial fibrillation -Rate controlled -Continue warfarin -Continue metoprolol Status post mitral valve replacement -bioprosthetic valve COPD -Continue roflumilast      Family Communication:   sister at beside Disposition Plan:   SNF when medically stable      Procedures/Studies: Dg Chest 2 View  06/15/2012  *RADIOLOGY REPORT*  Clinical Data: Left chest pain, weakness  CHEST - 2 VIEW  Comparison: Chest x-ray of 06/14/2012  Findings: There has been some increase in opacities at the lung bases most consistent with slight increase in effusions and basilar atelectasis.  Mild cardiomegaly is stable.  A dual lead permanent pacemaker remains.  The bones are osteopenic.  IMPRESSION: Some increase in volume of small effusions and basilar atelectasis bilaterally.   Original Report Authenticated By: [name], M.D.    Dg Chest 2 View  06/14/2012  *RADIOLOGY REPORT*  Clinical Data: Post MVR October 2013, shortness of breath  CHEST - 2 VIEW  Comparison: 05/27/2012  Findings: Left subclavian sequential transvenous pacemaker leads project at right atrium and right ventricle. Enlargement of cardiac silhouette post MVR. Mediastinal contours normal. Minimal pulmonary vascular congestion. Bibasilar effusions and atelectasis, increased on left. No pulmonary infiltrate or pneumothorax. Surgical clips right upper quadrant question cholecystectomy. Additional surgical clips right axilla. Bones appear demineralized. Question tiny left renal calculus.  IMPRESSION: Bibasilar effusions  atelectasis, increased on left since previous study. Mild enlargement of cardiac silhouette minimal pulmonary vascular congestion post MVR and pacemaker.   Original Report Authenticated By: [name], M.D.    Dg Chest 2 View  05/27/2012  *RADIOLOGY REPORT*  Clinical Data: Weakness.  Pleural effusion.  CHEST - 2 VIEW  Comparison: PA and lateral chest 05/24/2012.  Findings: Small bilateral pleural effusions and basilar atelectasis persist.  The patient's right pleural effusion appears decreased in size. Small right apical pneumothorax, less than 5%, has decreased in size.  Associated basilar atelectasis is noted.  There is cardiomegaly without edema.  IMPRESSION:  1.  Some decrease in a small right pleural effusion.  Tiny right apical pneumothorax is also decreased in size. 2.  No change in a very small left pleural effusion.   Original Report Authenticated By: [name]. [name], M.D.    Dg Chest 2 View  05/24/2012  *RADIOLOGY REPORT*  Clinical Data: Pacemaker placement  CHEST - 2 VIEW  Comparison: 05/23/2012  Findings: Stable asymmetric elevation of the right hemidiaphragm. Tiny bilateral pleural effusions persist.  Emphysema noted.  Tiny right apical pneumothorax is not substantially changed in the interval.  Left dual lead permanent pacemaker again noted. The cardiopericardial silhouette is enlarged.  IMPRESSION: Emphysema with tiny right apical pneumothorax, unchanged.  Tiny bilateral pleural effusions.   Original Report Authenticated By: [name], M.D.    Ct Head Wo Contrast  05/26/2012  *RADIOLOGY REPORT*  Clinical Data: Change in mental status.   Pt was ambulatory and talking yesterday. Not responding today.  CT HEAD WITHOUT CONTRAST  Technique:  Contiguous axial images were obtained from the base of the skull through the vertex without contrast.  Comparison: None.  Findings: The brain stem, cerebellum, cerebral peduncles, thalami, basal ganglia, basilar cisterns, and ventricular system  appear unremarkable.  Minimal periventricular white matter hypodensity suggest mild chronic ischemic microvascular white matter disease.  No intracranial hemorrhage, mass lesion, or acute infarction is identified.  Incidental failure of fusion of the posterior arch of C1 noted.  IMPRESSION:  1.  No acute intracranial findings. 2.  Mild chronic ischemic microvascular white matter disease.   Original Report Authenticated By: [name], M.D.    Dg Chest Port 1 View  05/23/2012  *RADIOLOGY REPORT*  Clinical Data: Post mitral valve replacement  PORTABLE CHEST - 1 VIEW  Comparison: 05/22/2012; 05/21/2012; 05/20/2012  Findings: Unchanged cardiac silhouette and mediastinal contours post mitral valve replacement.  Interval removal of right-sided chest tubes with unchanged tiny right apical pneumothorax. Unchanged small bilateral effusions, right greater than left. There is persistent mild elevation right hemidiaphragm.  Unchanged bibasilar heterogeneous opacities.  No new focal airspace opacity. No definite evidence of pulmonary edema.  Unchanged bones.  Post cholecystectomy.  IMPRESSION: 1.  Interval removal of right-sided chest tubes with unchanged tiny right apical pneumothorax. 2.  Unchanged small bilateral effusions and bibasilar opacities, right greater than left. 3.  No definite evidence of pulmonary edema.   Original Report Authenticated By: Waynard Reeds, M.D.    Dg Chest Portable 1 View  05/22/2012  *RADIOLOGY REPORT*  Clinical Data: Post pacemaker insertion  PORTABLE CHEST - 1 VIEW  Comparison: 05/21/2012; 05/20/2012  Findings:  Grossly unchanged cardiac silhouette and mediastinal contours post mitral valve repair.  Interval removal of left jugular approach central venous catheter.  Interval placement of left anterior chest wall dual lead pacemaker with tips overlying expected location of the right atrium and ventricle.  Two right-sided chest tubes are unchanged in positioning.  Note is made  of a tiny right apical pneumothorax.  There is persistent mild elevation of the right hemidiaphragm.  Lung volumes appear hyperexpanded.  Minimal improved aeration of the bilateral lung bases with persistent bibasilar opacities favored to represent atelectasis versus scar. Unchanged bones.  Post cholecystectomy.  IMPRESSION: 1.  Interval placement of left anterior chest wall dual lead pacemaker with tips overlying the expected location of the right atrium and ventricle. 2.  Stable positioning of two right-sided chest tubes, now with tiny right apical pneumothorax. 3.  Overall improved aeration of the lungs with persistent bibasilar opacities favored to represent atelectasis.   Original Report Authenticated By: Waynard Reeds, M.D.    Dg Chest Port 1 View  05/21/2012  *RADIOLOGY REPORT*  Clinical Data: Chest tube  PORTABLE CHEST - 1 VIEW  Comparison: 05/20/2012; 05/19/2012; 05/18/2012  Findings:  Grossly unchanged cardiac silhouette and mediastinal contours post aortic valve repair.  Interval removal of a left jugular approach central venous catheter.  A remaining left jugular approach central venous catheter tip overlies the superior cavoatrial junction. Stable positioning of two right-sided chest tubes.  No definite pneumothorax.  Unchanged small bilateral effusions and bibasilar opacities, right greater than left. No new focal airspace opacities.  No definite evidence of edema.  Unchanged bones.  Post cholecystectomy.  IMPRESSION: 1.  Interval removal of a left jugular approach central venous catheter.  Otherwise, stable position of support apparatus.  No pneumothorax. 2.  Unchanged small bilateral effusions and bibasilar opacities, right greater than left, possibly atelectasis   Original Report Authenticated By: Waynard Reeds, M.D.    Dg Chest Portable 1 View In Am  05/20/2012  *RADIOLOGY REPORT*  Clinical Data: Post mitral valve replacement.  PORTABLE CHEST - 1 VIEW  Comparison:  05/19/2012.  Findings:  Post valve replacement.  Right-sided chest tube remains in place.  No gross pneumothorax.  Swan-Ganz catheter has been removed.  Introducer remains in place with the tip projecting over the aortic arch level possibly at the left bracheocephalic vein level.  Kink at the level of the skin insertion site.  Left central line tip mid superior vena cava level.  Cardiomegaly.  Basilar subsegmental atelectasis with small bilateral pleural effusions suspected.  Mild central pulmonary vascular prominence without pulmonary edema.  IMPRESSION: Swan-Ganz catheter removed.  Introducer remains in place as noted above.  Basilar atelectatic changes with small pleural effusions suspected.  Cardiomegaly post valve replacement.   Original Report Authenticated By: Fuller Canada, M.D.    Dg Chest Portable 1 View In Am  05/19/2012  *RADIOLOGY REPORT*  Clinical Data: Status post minimally invasive mitral valve replacement and MAZE procedure.  PORTABLE CHEST - 1 VIEW  Comparison: Chest x-ray 05/18/2012.  Findings: The patient has been extubated.  There is a right-sided chest tube which is unchanged in position.  An additional drainage catheter is seen projecting over the lower right hemithorax extending across the midline with tip injecting over the region of the left lower hemithorax.  The position of this catheter is uncertain. There is a left-sided internal jugular central venous catheter with tip terminating in the distal superior vena cava. Left internal jugular central venous [place] through which a Swan Ganz catheter has been passed into the distal pulmonic trunk. Epicardial pacing wires remain in position.  There appears to be a stented bioprosthesis in the mitral valve position.  Lung volumes are low.  Bibasilar opacities favored to represent postoperative atelectasis with a superimposed small bilateral pleural effusions (left greater than right).  No definite pneumothorax.  No definite consolidative airspace disease.  Mild  pulmonary venous congestion, without frank pulmonary edema.  Mild enlargement of the cardiopericardial silhouette is similar to the recent prior examinations.  IMPRESSION: 1.  Postoperative changes and support apparatus, as above. 2.  Low lung volumes with slight worsening of bibasilar subsegmental postoperative atelectasis and small bilateral pleural effusions following extubation.   Original Report Authenticated By: Florencia Reasons, M.D.    Dg Chest Portable 1 View  05/18/2012  *RADIOLOGY REPORT*  Clinical Data: Postoperative evaluation.  PORTABLE CHEST - 1 VIEW  Comparison: 05/14/2012  Findings: 1558 hours.  Endotracheal tube tip is 1.6 cm above the base of the carina.  NG tube is looped in the fundus of the stomach with the tip in the body of the stomach.  Left IJ pulmonary catheter tip is in the main pulmonary artery.  The right chest tube noted without evidence for pneumothorax.  No evidence for overt pulmonary edema or substantial pleural effusion. The cardiopericardial silhouette is enlarged.  Vascular congestion noted with some questionable right perihilar atelectasis or edema.  IMPRESSION: Endotracheal tube tip is 1.6 cm above the base of the carina, directed towards the right mainstem bronchus.  Remaining support apparatus as described.  Cardiomegaly with vascular congestion and right perihilar atelectasis or edema.   Original Report Authenticated By: [name], M.D.          Subjective: Patient states that she is breathing 10-15% better. She denies any fevers, chills, chest pain, nausea, vomiting, diarrhea, abdominal pain, dysuria, hematuria.  Objective: Filed Vitals:   06/16/12 0428 06/16/12 1057 06/16/12 1300 06/16/12 1900  BP: 128/60 123/45 103/45 105/54  Pulse: 77 74 74 70  Temp: 98.2 F (36.8 C)  97.7  F (36.5 C) 97.6 F (36.4 C)  TempSrc: Oral  Oral Oral  Resp: 18  20 18   Height:      Weight:      SpO2: 100% 100% 95% 98%    Intake/Output Summary (Last 24  hours) at 06/16/12 1951 Last data filed at 06/16/12 1900  Gross per 24 hour  Intake    363 ml  Output   2026 ml  Net  -1663 ml   Weight change:  Exam:   General:  Pt is alert, follows commands appropriately, not in acute distress  HEENT: No icterus, No thrush,  [place]/AT  Cardiovascular: RRR, S1/S2, no rubs, no gallops  Respiratory: Bibasilar crackles. No wheezes or rhonchi.  Abdomen: Soft/+BS, non tender, non distended, no guarding  Extremities: 2+ edema, No lymphangitis, No petechiae, No rashes, no synovitis  Data Reviewed: Basic Metabolic Panel:  Lab 06/15/12 7829 06/14/12 1400  NA 130* 131*  K 4.6 4.7  CL 92* 96  CO2 30 30  GLUCOSE 137* 105*  BUN 10 8  CREATININE 0.97 0.80  CALCIUM 9.2 9.2  MG -- --  PHOS -- --   Liver Function Tests:  Lab 06/15/12 1708 06/14/12 1400  AST 19 19  ALT 11 12  ALKPHOS 99 77  BILITOT 0.4 0.5  PROT 6.2 5.7*  ALBUMIN 3.1* 3.2*   No results found for this basename: LIPASE:5,AMYLASE:5 in the last 168 hours No results found for this basename: AMMONIA:5 in the last 168 hours CBC:  Lab 06/15/12 1708 06/14/12 1400  WBC 9.4 8.9  NEUTROABS -- --  HGB 10.6* 10.5*  HCT 33.7* 30.8*  MCV 90.3 86.8  PLT 237 245   Cardiac Enzymes:  Lab 06/15/12 1714  CKTOTAL --  CKMB --  CKMBINDEX --  TROPONINI <0.30   BNP: No components found with this basename: POCBNP:5 CBG: No results found for this basename: GLUCAP:5 in the last 168 hours  Recent Results (from the past 240 hour(s))  MRSA PCR SCREENING     Status: Normal   Collection Time   06/15/12 10:29 PM      Component Value Range Status Comment   MRSA by PCR NEGATIVE  NEGATIVE Final      Scheduled Meds:   . aspirin EC  81 mg Oral Daily  . calcium-vitamin D  1 tablet Oral BID  . docusate sodium  100 mg Oral BID  . feeding supplement  1 Container Oral TID BM  . folic acid  2 mg Oral Daily  . furosemide  40 mg Intravenous BID  . lisinopril  10 mg Oral Daily  . LORazepam  0.5  mg Intravenous Once  . metoprolol  75 mg Oral BID  . potassium chloride SA  20 mEq Oral Daily  . predniSONE  2.5 mg Oral QODAY  . roflumilast  500 mcg Oral Daily  . [COMPLETED] sodium chloride  500 mL Intravenous Once  . sodium chloride  3 mL Intravenous Q12H  . sodium chloride  3 mL Intravenous Q12H  . [COMPLETED] warfarin  1 mg Oral ONCE-1800  . Warfarin - Pharmacist Dosing Inpatient   Does not apply q1800  . [DISCONTINUED] Calcium Carbonate-Vitamin D  1 tablet Oral BID   Continuous Infusions:    Shellia Hartl, DO  [facility] Pager [phone]  If 7PM-7AM, please contact night-coverage [email or web address] Password [email or web address] 06/16/2012, 7:51 PM   LOS: 1 day

## 2012-06-16 NOTE — Progress Notes (Signed)
ANTICOAGULATION CONSULT NOTE - Follow Up Consult  Pharmacy Consult for Coumadin  Indication: atrial fibrillation  Allergies  Allergen Reactions  . Promethazine Hcl Other (See Comments)    unknown  . Sulfonamide Derivatives Other (See Comments)    unknown  . Tequin Other (See Comments)    hallucinations     Patient Measurements: Height: 5' (152.4 cm) Weight: 109 lb 12.8 oz (49.805 kg) (SCALE B) IBW/kg (Calculated) : 45.5  Vital Signs: Temp: 98.2 F (36.8 C) (11/13 0428) Temp src: Oral (11/13 0428) BP: 128/60 mmHg (11/13 0428) Pulse Rate: 77  (11/13 0428)  Labs:  Basename 06/16/12 0829 06/15/12 1939 06/15/12 1714 06/15/12 1708 06/14/12 1400  HGB -- -- -- 10.6* 10.5*  HCT -- -- -- 33.7* 30.8*  PLT -- -- -- 237 245  APTT -- -- -- -- --  LABPROT 27.6* 33.4* -- -- --  INR 2.73* 3.54* -- -- --  HEPARINUNFRC -- -- -- -- --  CREATININE -- -- -- 0.97 0.80  CKTOTAL -- -- -- -- --  CKMB -- -- -- -- --  TROPONINI -- -- <0.30 -- --    Estimated Creatinine Clearance: 31.6 ml/min (by C-G formula based on Cr of 0.97).  Assessment: Nancy Bush is a 78 y.o F with SOB and edema on coumadin PTA for Afib and mitral valve replaced with  [facility] Mitral Bioprosthetic Tissue Valve on 05/18/12. She has not received coumadin since 11/11 d/t elevated INR. INR now down to 2.73. Yesterday's cbc shows anemia but plts wnl. No bleeding noted.   Per notes, patient is refusing medications.   Goal of Therapy:  INR 2-3 Monitor platelets by anticoagulation protocol: Yes   Plan:  Coumadin 1mg  po x 1 dose tonight F/u INR in the AM  Thank you,  Brett Fairy, PharmD 06/16/2012 11:03 AM

## 2012-06-16 NOTE — Progress Notes (Signed)
CSW will meet with patient in am to discuss placement or returning to [facility] Sabino Niemann, MSW, [employer]  [phone]

## 2012-06-16 NOTE — Progress Notes (Signed)
UR Chart Review Completed  

## 2012-06-17 ENCOUNTER — Inpatient Hospital Stay (HOSPITAL_COMMUNITY): Payer: Medicare Other

## 2012-06-17 ENCOUNTER — Encounter (HOSPITAL_COMMUNITY): Payer: Self-pay | Admitting: Physician Assistant

## 2012-06-17 DIAGNOSIS — I369 Nonrheumatic tricuspid valve disorder, unspecified: Secondary | ICD-10-CM

## 2012-06-17 DIAGNOSIS — I4891 Unspecified atrial fibrillation: Secondary | ICD-10-CM

## 2012-06-17 DIAGNOSIS — I5033 Acute on chronic diastolic (congestive) heart failure: Principal | ICD-10-CM

## 2012-06-17 DIAGNOSIS — R079 Chest pain, unspecified: Secondary | ICD-10-CM

## 2012-06-17 DIAGNOSIS — Z954 Presence of other heart-valve replacement: Secondary | ICD-10-CM

## 2012-06-17 LAB — BASIC METABOLIC PANEL
BUN: 9 mg/dL (ref 6–23)
Calcium: 9 mg/dL (ref 8.4–10.5)
GFR calc Af Amer: 77 mL/min — ABNORMAL LOW (ref >90)
GFR calc non Af Amer: 66 mL/min — ABNORMAL LOW (ref >90)
Glucose, Bld: 119 mg/dL — ABNORMAL HIGH (ref 70–99)
Potassium: 3.6 mEq/L (ref 3.5–5.1)
Sodium: 137 mEq/L (ref 135–145)

## 2012-06-17 LAB — CBC
Hemoglobin: 10.2 g/dL — ABNORMAL LOW (ref 12.0–15.0)
MCH: 29.2 pg (ref 26.0–34.0)
MCHC: 31.6 g/dL (ref 30.0–36.0)
Platelets: 228 10*3/uL (ref 150–400)
RBC: 3.49 MIL/uL — ABNORMAL LOW (ref 3.87–5.11)

## 2012-06-17 LAB — PROTIME-INR: Prothrombin Time: 24.2 seconds — ABNORMAL HIGH (ref 11.6–15.2)

## 2012-06-17 LAB — TROPONIN I
Troponin I: <0.3 ng/mL (ref <0.30)
Troponin I: <0.3 ng/mL (ref <0.30)

## 2012-06-17 MED ORDER — NITROGLYCERIN 0.4 MG SL SUBL
SUBLINGUAL_TABLET | SUBLINGUAL | Status: AC
  Administered 2012-06-17: 0.4 mg
  Filled 2012-06-17: qty 25

## 2012-06-17 MED ORDER — FUROSEMIDE 10 MG/ML IJ SOLN
40.0000 mg | Freq: Three times a day (TID) | INTRAMUSCULAR | Status: DC
  Administered 2012-06-18 – 2012-06-19 (×4): 40 mg via INTRAVENOUS
  Filled 2012-06-17 (×7): qty 4

## 2012-06-17 MED ORDER — POTASSIUM CHLORIDE CRYS ER 20 MEQ PO TBCR
40.0000 meq | EXTENDED_RELEASE_TABLET | Freq: Every day | ORAL | Status: DC
  Administered 2012-06-17 – 2012-06-24 (×8): 40 meq via ORAL
  Filled 2012-06-17 (×8): qty 2

## 2012-06-17 MED ORDER — FUROSEMIDE 10 MG/ML IJ SOLN
40.0000 mg | Freq: Three times a day (TID) | INTRAMUSCULAR | Status: DC
  Administered 2012-06-17 (×2): 40 mg via INTRAVENOUS

## 2012-06-17 MED ORDER — SODIUM CHLORIDE 0.9 % IV SOLN
INTRAVENOUS | Status: DC
  Administered 2012-06-17: 06:00:00 via INTRAVENOUS

## 2012-06-17 MED ORDER — WARFARIN SODIUM 2 MG PO TABS
2.0000 mg | ORAL_TABLET | Freq: Once | ORAL | Status: AC
  Administered 2012-06-17: 2 mg via ORAL
  Filled 2012-06-17: qty 1

## 2012-06-17 MED ORDER — NITROGLYCERIN 0.4 MG SL SUBL
0.4000 mg | SUBLINGUAL_TABLET | SUBLINGUAL | Status: DC | PRN
  Administered 2012-06-17 (×3): 0.4 mg via SUBLINGUAL

## 2012-06-17 NOTE — Progress Notes (Signed)
ANTICOAGULATION CONSULT NOTE - Follow Up Consult  Pharmacy Consult for Coumadin  Indication: atrial fibrillation  Allergies  Allergen Reactions  . Promethazine Hcl Other (See Comments)    unknown  . Sulfonamide Derivatives Other (See Comments)    unknown  . Tequin Other (See Comments)    hallucinations     Patient Measurements: Height: 5' (152.4 cm) Weight: 106 lb 0.7 oz (48.1 kg) (scale B) IBW/kg (Calculated) : 45.5  Vital Signs: Temp: 98.3 F (36.8 C) (11/14 0517) Temp src: Oral (11/14 0517) BP: 112/54 mmHg (11/14 0535) Pulse Rate: 72  (11/14 0535)  Labs:  Basename 06/17/12 0500 06/16/12 0829 06/15/12 1939 06/15/12 1714 06/15/12 1708 06/14/12 1400  HGB 10.2* -- -- -- 10.6* --  HCT 32.3* -- -- -- 33.7* 30.8*  PLT 228 -- -- -- 237 245  APTT -- -- -- -- -- --  LABPROT 24.2* 27.6* 33.4* -- -- --  INR 2.29* 2.73* 3.54* -- -- --  HEPARINUNFRC -- -- -- -- -- --  CREATININE 0.80 -- -- -- 0.97 0.80  CKTOTAL -- -- -- -- -- --  CKMB -- -- -- -- -- --  TROPONINI -- -- -- <0.30 -- --    Estimated Creatinine Clearance: 38.3 ml/min (by C-G formula based on Cr of 0.8).  Assessment: Nancy Bush is a 78 y.o F with SOB and edema on coumadin PTA for Afib and mitral valve replaced with  [facility] Mitral Bioprosthetic Tissue Valve on 05/18/12. Coumadin was held 11/11>>11/12 d/t elevated INR. INR now down to 229. CBC low but stable No bleeding noted.   Goal of Therapy:  INR 2-3 Monitor platelets by anticoagulation protocol: Yes   Plan:  Coumadin 2mg  po x 1 dose tonight F/u INR in the AM  Thank you,  Talbert Cage, PharmD 06/17/2012 11:12 AM

## 2012-06-17 NOTE — Clinical Social Work Psychosocial (Signed)
Clinical Social Work Department BRIEF PSYCHOSOCIAL ASSESSMENT 06/17/2012  Patient:  Nancy Bush, Nancy Bush     Account Number:  [id number]     Admit date:  06/15/2012  Clinical Social Worker:  Juliette Mangle  Date/Time:  06/17/2012 11:00 AM  Referred by:  Physician  Date Referred:  06/17/2012 Referred for  SNF Placement   Other Referral:   Interview type:  Patient Other interview type:   Patient's siter Nancy Bush was at the bedside    PSYCHOSOCIAL DATA Living Status:  ALONE Admitted from facility:   Level of care:   Primary support name:  Emmit Alexanders Primary support relationship to patient:  SIBLING Degree of support available:   strong    CURRENT CONCERNS Current Concerns  Post-Acute Placement   Other Concerns:    SOCIAL WORK ASSESSMENT / PLAN Clinical Social Worker received referral that patient is from a SNF. CSW reviewed chart and met with patient and patient's sister at bedside. CSW introduced self, explained role, and provided emotional support.Patient isagreeable to returning to [facility]. CSW will begin all necessary paperwork for patient to return to the facility. CSW will continue to follow.   Assessment/plan status:  Psychosocial Support/Ongoing Assessment of Needs Other assessment/ plan:   Information/referral to community resources:   none noted    PATIENT'S/FAMILY'S RESPONSE TO PLAN OF CARE: Patient and patient's siter were very appreciative and receptive to information and support provided by CSW. CSW will continue to follow      Sabino Niemann, MSW, [place] [phone]

## 2012-06-17 NOTE — Progress Notes (Signed)
[facility] PROGRESS NOTE  Nancy Bush ZOX:[id number] DOB: [birth year] DOA: 06/15/2012 PCP: [name], MD  Assessment/Plan: Acute on chronic diastolic CHF  -Ejection fraction 50-55%  -increase furosemide to 40 mg IV every 8 -Fluid restriction, daily weights  -neg 2254cc/ -1.7 kg for the admission  Atrial fibrillation s/p MAZE procedure -Rate controlled  -Continue warfarin  -Continue metoprolol  -Consider switching metoprolol to carvedilol Tricuspid regurgitation -Echocardiogram on 06/16/2012 revealed worsening tricuspid regurgitation compared to TEE on 02/09/2012 -Consult cardiology for evaluation in the face of decompensated CHF Status post mitral valve replacement  -bioprosthetic valve  COPD  -Continue roflumilast, low dose prednisone Chest pain -EKG -cycle troponins -Chronic left bundle-branch block on EKG Normocytic anemia -Likely anemia of chronic disease -Hemoglobin has remained stable     Family Communication:   Updated family at the bedside Disposition Plan:   SNF when medically stable      Procedures/Studies: Dg Chest 2 View  06/15/2012  *RADIOLOGY REPORT*  Clinical Data: Left chest pain, weakness  CHEST - 2 VIEW  Comparison: Chest x-ray of 06/14/2012  Findings: There has been some increase in opacities at the lung bases most consistent with slight increase in effusions and basilar atelectasis.  Mild cardiomegaly is stable.  A dual lead permanent pacemaker remains.  The bones are osteopenic.  IMPRESSION: Some increase in volume of small effusions and basilar atelectasis bilaterally.   Original Report Authenticated By: [name], M.D.    Dg Chest 2 View  06/14/2012  *RADIOLOGY REPORT*  Clinical Data: Post MVR October 2013, shortness of breath  CHEST - 2 VIEW  Comparison: 05/27/2012  Findings: Left subclavian sequential transvenous pacemaker leads project at right atrium and right ventricle. Enlargement of cardiac silhouette post MVR.  Mediastinal contours normal. Minimal pulmonary vascular congestion. Bibasilar effusions and atelectasis, increased on left. No pulmonary infiltrate or pneumothorax. Surgical clips right upper quadrant question cholecystectomy. Additional surgical clips right axilla. Bones appear demineralized. Question tiny left renal calculus.  IMPRESSION: Bibasilar effusions atelectasis, increased on left since previous study. Mild enlargement of cardiac silhouette minimal pulmonary vascular congestion post MVR and pacemaker.   Original Report Authenticated By: [name], M.D.    Dg Chest 2 View  05/27/2012  *RADIOLOGY REPORT*  Clinical Data: Weakness.  Pleural effusion.  CHEST - 2 VIEW  Comparison: PA and lateral chest 05/24/2012.  Findings: Small bilateral pleural effusions and basilar atelectasis persist.  The patient's right pleural effusion appears decreased in size. Small right apical pneumothorax, less than 5%, has decreased in size.  Associated basilar atelectasis is noted.  There is cardiomegaly without edema.  IMPRESSION:  1.  Some decrease in a small right pleural effusion.  Tiny right apical pneumothorax is also decreased in size. 2.  No change in a very small left pleural effusion.   Original Report Authenticated By: [name]. [name], M.D.    Dg Chest 2 View  05/24/2012  *RADIOLOGY REPORT*  Clinical Data: Pacemaker placement  CHEST - 2 VIEW  Comparison: 05/23/2012  Findings: Stable asymmetric elevation of the right hemidiaphragm. Tiny bilateral pleural effusions persist.  Emphysema noted.  Tiny right apical pneumothorax is not substantially changed in the interval.  Left dual lead permanent pacemaker again noted. The cardiopericardial silhouette is enlarged.  IMPRESSION: Emphysema with tiny right apical pneumothorax, unchanged.  Tiny bilateral pleural effusions.   Original Report Authenticated By: [name], M.D.    Ct Head Wo Contrast  05/26/2012  *RADIOLOGY REPORT*  Clinical Data: Change in  mental status.  Pt was ambulatory and talking yesterday. Not responding today.  CT HEAD WITHOUT CONTRAST  Technique:  Contiguous axial images were obtained from the base of the skull through the vertex without contrast.  Comparison: None.  Findings: The brain stem, cerebellum, cerebral peduncles, thalami, basal ganglia, basilar cisterns, and ventricular system appear unremarkable.  Minimal periventricular white matter hypodensity suggest mild chronic ischemic microvascular white matter disease.  No intracranial hemorrhage, mass lesion, or acute infarction is identified.  Incidental failure of fusion of the posterior arch of C1 noted.  IMPRESSION:  1.  No acute intracranial findings. 2.  Mild chronic ischemic microvascular white matter disease.   Original Report Authenticated By: [name], M.D.    Dg Chest Port 1 View  05/23/2012  *RADIOLOGY REPORT*  Clinical Data: Post mitral valve replacement  PORTABLE CHEST - 1 VIEW  Comparison: 05/22/2012; 05/21/2012; 05/20/2012  Findings: Unchanged cardiac silhouette and mediastinal contours post mitral valve replacement.  Interval removal of right-sided chest tubes with unchanged tiny right apical pneumothorax. Unchanged small bilateral effusions, right greater than left. There is persistent mild elevation right hemidiaphragm.  Unchanged bibasilar heterogeneous opacities.  No new focal airspace opacity. No definite evidence of pulmonary edema.  Unchanged bones.  Post cholecystectomy.  IMPRESSION: 1.  Interval removal of right-sided chest tubes with unchanged tiny right apical pneumothorax. 2.  Unchanged small bilateral effusions and bibasilar opacities, right greater than left. 3.  No definite evidence of pulmonary edema.   Original Report Authenticated By: Waynard Reeds, M.D.    Dg Chest Portable 1 View  05/22/2012  *RADIOLOGY REPORT*  Clinical Data: Post pacemaker insertion  PORTABLE CHEST - 1 VIEW  Comparison: 05/21/2012; 05/20/2012  Findings:  Grossly  unchanged cardiac silhouette and mediastinal contours post mitral valve repair.  Interval removal of left jugular approach central venous catheter.  Interval placement of left anterior chest wall dual lead pacemaker with tips overlying expected location of the right atrium and ventricle.  Two right-sided chest tubes are unchanged in positioning.  Note is made of a tiny right apical pneumothorax.  There is persistent mild elevation of the right hemidiaphragm.  Lung volumes appear hyperexpanded.  Minimal improved aeration of the bilateral lung bases with persistent bibasilar opacities favored to represent atelectasis versus scar. Unchanged bones.  Post cholecystectomy.  IMPRESSION: 1.  Interval placement of left anterior chest wall dual lead pacemaker with tips overlying the expected location of the right atrium and ventricle. 2.  Stable positioning of two right-sided chest tubes, now with tiny right apical pneumothorax. 3.  Overall improved aeration of the lungs with persistent bibasilar opacities favored to represent atelectasis.   Original Report Authenticated By: Waynard Reeds, M.D.    Dg Chest Port 1 View  05/21/2012  *RADIOLOGY REPORT*  Clinical Data: Chest tube  PORTABLE CHEST - 1 VIEW  Comparison: 05/20/2012; 05/19/2012; 05/18/2012  Findings:  Grossly unchanged cardiac silhouette and mediastinal contours post aortic valve repair.  Interval removal of a left jugular approach central venous catheter.  A remaining left jugular approach central venous catheter tip overlies the superior cavoatrial junction. Stable positioning of two right-sided chest tubes.  No definite pneumothorax.  Unchanged small bilateral effusions and bibasilar opacities, right greater than left. No new focal airspace opacities.  No definite evidence of edema.  Unchanged bones.  Post cholecystectomy.  IMPRESSION: 1.  Interval removal of a left jugular approach central venous catheter.  Otherwise, stable position of support apparatus.   No pneumothorax. 2.  Unchanged  small bilateral effusions and bibasilar opacities, right greater than left, possibly atelectasis   Original Report Authenticated By: Waynard Reeds, M.D.    Dg Chest Portable 1 View In Am  05/20/2012  *RADIOLOGY REPORT*  Clinical Data: Post mitral valve replacement.  PORTABLE CHEST - 1 VIEW  Comparison: 05/19/2012.  Findings: Post valve replacement.  Right-sided chest tube remains in place.  No gross pneumothorax.  Swan-Ganz catheter has been removed.  Introducer remains in place with the tip projecting over the aortic arch level possibly at the left bracheocephalic vein level.  Kink at the level of the skin insertion site.  Left central line tip mid superior vena cava level.  Cardiomegaly.  Basilar subsegmental atelectasis with small bilateral pleural effusions suspected.  Mild central pulmonary vascular prominence without pulmonary edema.  IMPRESSION: Swan-Ganz catheter removed.  Introducer remains in place as noted above.  Basilar atelectatic changes with small pleural effusions suspected.  Cardiomegaly post valve replacement.   Original Report Authenticated By: Fuller Canada, M.D.    Dg Chest Portable 1 View In Am  05/19/2012  *RADIOLOGY REPORT*  Clinical Data: Status post minimally invasive mitral valve replacement and MAZE procedure.  PORTABLE CHEST - 1 VIEW  Comparison: Chest x-ray 05/18/2012.  Findings: The patient has been extubated.  There is a right-sided chest tube which is unchanged in position.  An additional drainage catheter is seen projecting over the lower right hemithorax extending across the midline with tip injecting over the region of the left lower hemithorax.  The position of this catheter is uncertain. There is a left-sided internal jugular central venous catheter with tip terminating in the distal superior vena cava. Left internal jugular central venous [place] through which a Swan Ganz catheter has been passed into the distal pulmonic trunk.  Epicardial pacing wires remain in position.  There appears to be a stented bioprosthesis in the mitral valve position.  Lung volumes are low.  Bibasilar opacities favored to represent postoperative atelectasis with a superimposed small bilateral pleural effusions (left greater than right).  No definite pneumothorax.  No definite consolidative airspace disease.  Mild pulmonary venous congestion, without frank pulmonary edema.  Mild enlargement of the cardiopericardial silhouette is similar to the recent prior examinations.  IMPRESSION: 1.  Postoperative changes and support apparatus, as above. 2.  Low lung volumes with slight worsening of bibasilar subsegmental postoperative atelectasis and small bilateral pleural effusions following extubation.   Original Report Authenticated By: Florencia Reasons, M.D.    Dg Chest Portable 1 View  05/18/2012  *RADIOLOGY REPORT*  Clinical Data: Postoperative evaluation.  PORTABLE CHEST - 1 VIEW  Comparison: 05/14/2012  Findings: 1558 hours.  Endotracheal tube tip is 1.6 cm above the base of the carina.  NG tube is looped in the fundus of the stomach with the tip in the body of the stomach.  Left IJ pulmonary catheter tip is in the main pulmonary artery.  The right chest tube noted without evidence for pneumothorax.  No evidence for overt pulmonary edema or substantial pleural effusion. The cardiopericardial silhouette is enlarged.  Vascular congestion noted with some questionable right perihilar atelectasis or edema.  IMPRESSION: Endotracheal tube tip is 1.6 cm above the base of the carina, directed towards the right mainstem bronchus.  Remaining support apparatus as described.  Cardiomegaly with vascular congestion and right perihilar atelectasis or edema.   Original Report Authenticated By: [name], M.D.          Subjective: Patient states that breathing  is 25% better. She had some chest discomfort last night. Currently pain-free. Denies fevers, chills,  vomiting, diarrhea, abdominal pain. Appetite is poor. Denies coughing or hemoptysis.  Objective: Filed Vitals:   06/17/12 0535 06/17/12 0638 06/17/12 1100 06/17/12 1406  BP: 112/54  114/54 112/52  Pulse: 72  73 74  Temp:    98.7 F (37.1 C)  TempSrc:    Oral  Resp:    18  Height:      Weight:  48.1 kg (106 lb 0.7 oz)    SpO2: 100%   100%    Intake/Output Summary (Last 24 hours) at 06/17/12 1428 Last data filed at 06/17/12 0902  Gross per 24 hour  Intake    380 ml  Output   1852 ml  Net  -1472 ml   Weight change: -1.705 kg (-3 lb 12.1 oz) Exam:   General:  Pt is alert, follows commands appropriately, not in acute distress  HEENT: No icterus, No thrush,  [place]/AT  Cardiovascular: RRR, S1/S2, no rubs, no gallops  Respiratory: Bibasilar crackles. No wheezes or rhonchi. Good air movement.  Abdomen: Soft/+BS, non tender, non distended, no guarding  Extremities: 2+ edema, No lymphangitis, No petechiae, No rashes, no synovitis  Data Reviewed: Basic Metabolic Panel:  Lab 06/17/12 4782 06/15/12 1708 06/14/12 1400  NA 137 130* 131*  K 3.6 4.6 4.7  CL 95* 92* 96  CO2 35* 30 30  GLUCOSE 119* 137* 105*  BUN 9 10 8   CREATININE 0.80 0.97 0.80  CALCIUM 9.0 9.2 9.2  MG -- -- --  PHOS -- -- --   Liver Function Tests:  Lab 06/15/12 1708 06/14/12 1400  AST 19 19  ALT 11 12  ALKPHOS 99 77  BILITOT 0.4 0.5  PROT 6.2 5.7*  ALBUMIN 3.1* 3.2*   No results found for this basename: LIPASE:5,AMYLASE:5 in the last 168 hours No results found for this basename: AMMONIA:5 in the last 168 hours CBC:  Lab 06/17/12 0500 06/15/12 1708 06/14/12 1400  WBC 11.4* 9.4 8.9  NEUTROABS -- -- --  HGB 10.2* 10.6* 10.5*  HCT 32.3* 33.7* 30.8*  MCV 92.6 90.3 86.8  PLT 228 237 245   Cardiac Enzymes:  Lab 06/15/12 1714  CKTOTAL --  CKMB --  CKMBINDEX --  TROPONINI <0.30   BNP: No components found with this basename: POCBNP:5 CBG: No results found for this basename: GLUCAP:5 in the  last 168 hours  Recent Results (from the past 240 hour(s))  MRSA PCR SCREENING     Status: Normal   Collection Time   06/15/12 10:29 PM      Component Value Range Status Comment   MRSA by PCR NEGATIVE  NEGATIVE Final      Scheduled Meds:   . aspirin EC  81 mg Oral Daily  . calcium-vitamin D  1 tablet Oral BID  . docusate sodium  100 mg Oral BID  . feeding supplement  1 Container Oral TID BM  . folic acid  2 mg Oral Daily  . furosemide  40 mg Intravenous Q8H  . LORazepam  0.5 mg Intravenous Once  . metoprolol  75 mg Oral BID  . [COMPLETED] nitroGLYCERIN      . potassium chloride SA  20 mEq Oral Daily  . predniSONE  2.5 mg Oral QODAY  . roflumilast  500 mcg Oral Daily  . sodium chloride  3 mL Intravenous Q12H  . sodium chloride  3 mL Intravenous Q12H  . [COMPLETED] warfarin  1  mg Oral ONCE-1800  . warfarin  2 mg Oral ONCE-1800  . Warfarin - Pharmacist Dosing Inpatient   Does not apply q1800  . [DISCONTINUED] furosemide  40 mg Intravenous BID  . [DISCONTINUED] lisinopril  10 mg Oral Daily   Continuous Infusions:   . sodium chloride 10 mL/hr at 06/17/12 0530     Audra Bellard, DO  [facility] Pager [phone]  If 7PM-7AM, please contact night-coverage [email or web address] Password [email or web address] 06/17/2012, 2:28 PM   LOS: 2 days

## 2012-06-17 NOTE — Progress Notes (Signed)
  Echocardiogram 2D Echocardiogram has been performed.  Elna Radovich 06/17/2012, 10:09 AM

## 2012-06-17 NOTE — Consult Note (Signed)
CARDIOLOGY CONSULT NOTE   Patient ID: KAIDYNCE SPYKER MRN: [id number] DOB/AGE: February 23, 1926 78 y.o.  Admit date: 06/15/2012  Primary Physician   [name], MD Primary Cardiologist    JH/GT  Reason for Consultation   CHF  [id number] H Ferrigno is a 78 y.o. female with a history of MR/MVP. She was hospitalized 10/15-10/25 for minimally invasive MVR with bioprosthetic MV and Maze procedure. She was discharged on Lasix 40 mg daily. She was seen in the office by GT 11/8 and she had mild peripheral edema, but no acute SOB. She was not on a diuretic. When she saw Dr Cornelius Moras on 11/11, she had increased LE edema as well as chest tightness.  She was started on oral Lasix at 80 mg bid, but her SOB worsened and she came to the ER and was admitted on 11/12.  She received Lasix 40 mg IV x 1 on 11/12, BID on 11/13, dose increased to 40 mg q 8 hours today.  Her BUN/Cr have been stable since admission and her potassium has decreased but is still WNL. She had an echo which showed increased TR, and cardiology was asked to evaluate her for this.  Pt c/o increasing SOB prior to admission and this has improved some, but she still requires oxygen and c/o orthopnea. Her edema has not improved much. She is having no chest pain and no bleeding issues. She has not had fevers or chills.    Past Medical History  Diagnosis Date  . MVP (mitral valve prolapse)   . Pulmonary hypertension     MILD TO MODERATE BY ECHO  . History of chest pain   . Mitral regurgitation     MILD  . Asthma   . Rheumatoid arthritis   . Shortness of breath   . H/O: hysterectomy   . LBBB (left bundle branch block)     per discharge note 2003  . PVC (premature ventricular contraction)   . Atrial fibrillation   . Hypertension   . Heart murmur   . S/P mitral valve replacement with bioprosthetic valve 05/18/2012    27mm Edwards Magna Mitral pericardial bioprosthesis   . S/P Maze operation for atrial fibrillation 05/18/2012   Complete biatrial lesion set using cryothermy with oversewing of LA appendage  . COPD (chronic obstructive pulmonary disease)   . CHF (congestive heart failure)   . Pacemaker      Past Surgical History  Procedure Date  . Cholecystectomy   . Ovarian cyst removal   . Hysterectomy   . Appendectomy   . Cataract extraction   . Tee without cardioversion 02/09/2012    Procedure: TRANSESOPHAGEAL ECHOCARDIOGRAM (TEE);  Surgeon: Pricilla Riffle, MD;  Location: [facility] ENDOSCOPY;  Service: Cardiovascular;  Laterality: N/A;  . Maze 05/18/2012    Procedure: MAZE;  Surgeon: Purcell Nails, MD;  Location: [facility] OR;  Service: Open Heart Surgery;  Laterality: Right;  . Mitral valve replacement 05/18/2012    Procedure: MINIMALLY INVASIVE MITRAL VALVE (MV) REPLACEMENT;  Surgeon: Purcell Nails, MD;  Location: [facility] OR;  Service: Open Heart Surgery;  Laterality: N/A;  . Pacemaker insertion   . Abdominal hysterectomy     Allergies  Allergen Reactions  . Promethazine Hcl Other (See Comments)    unknown  . Sulfonamide Derivatives Other (See Comments)    unknown  . Tequin Other (See Comments)    hallucinations     I have reviewed the patient's current medications    . aspirin EC  81  mg Oral Daily  . calcium-vitamin D  1 tablet Oral BID  . docusate sodium  100 mg Oral BID  . feeding supplement  1 Container Oral TID BM  . folic acid  2 mg Oral Daily  . furosemide  40 mg Intravenous Q8H  . LORazepam  0.5 mg Intravenous Once  . metoprolol  75 mg Oral BID  . [COMPLETED] nitroGLYCERIN      . potassium chloride SA  20 mEq Oral Daily  . predniSONE  2.5 mg Oral QODAY  . roflumilast  500 mcg Oral Daily  . sodium chloride  3 mL Intravenous Q12H  . sodium chloride  3 mL Intravenous Q12H  . [COMPLETED] warfarin  1 mg Oral ONCE-1800  . warfarin  2 mg Oral ONCE-1800  . Warfarin - Pharmacist Dosing Inpatient   Does not apply q1800  . [DISCONTINUED] furosemide  40 mg Intravenous BID  . [DISCONTINUED] lisinopril  10  mg Oral Daily      . sodium chloride 10 mL/hr at 06/17/12 0530   sodium chloride, levalbuterol, magnesium hydroxide, nitroGLYCERIN, ondansetron (ZOFRAN) IV, ondansetron, sodium chloride  Medication Sig  Calcium Carbonate-Vitamin D (CALTRATE 600+D) 600-400 MG-UNIT per tablet Take 1 tablet by mouth 2 (two) times daily.  docusate sodium (COLACE) 100 MG capsule Take 100 mg by mouth 2 (two) times daily.  folic acid (FOLVITE) 1 MG tablet Take 2 mg by mouth daily.   furosemide (LASIX) 40 MG tablet Take 40 mg by mouth 2 (two) times daily.  levalbuterol (XOPENEX HFA) 45 MCG/ACT inhaler Inhale 2 puffs into the lungs every 6 (six) hours as needed. For wheezing  metoprolol tartrate (LOPRESSOR) 25 MG tablet Take 75 mg by mouth 2 (two) times daily.  ondansetron (ZOFRAN) 4 MG tablet Take 4 mg by mouth every 6 (six) hours as needed. For nausea  potassium chloride SA (K-DUR,KLOR-CON) 20 MEQ tablet Take 20 mEq by mouth See admin instructions. Potassium twice daily for 3 days (stop date 06/17/12), then once daily (start on 06/18/12)  predniSONE (DELTASONE) 2.5 MG tablet Take 2.5 mg by mouth every other day.  roflumilast (DALIRESP) 500 MCG TABS tablet Take 500 mcg by mouth daily.  traMADol (ULTRAM) 50 MG tablet Take 50 mg by mouth every 4 (four) hours as needed. For pain  warfarin (COUMADIN) 1 MG tablet Take 1-3 mg by mouth daily. Takes 3mg  daily except 1mg  on Fridays     History   Social History  . Marital Status: Single    Spouse Name: N/A    Number of Children: N/A  . Years of Education: N/A   Occupational History  . Not on file.   Social History Main Topics  . Smoking status: Never Smoker   . Smokeless tobacco: Not on file  . Alcohol Use: No  . Drug Use: No  . Sexually Active: Not on file   Other Topics Concern  . Not on file   Social History Narrative  . Went to [facility] for rehab after surgery.    Family Status  Relation Status Death Age  . Mother Deceased   .  Father Deceased    ROS:  Full 14 point review of systems complete and found to be negative unless listed above.  Physical Exam: Blood pressure 112/52, pulse 74, temperature 98.7 F (37.1 C), temperature source Oral, resp. rate 18, height 5' (1.524 m), weight 106 lb 0.7 oz (48.1 kg), SpO2 100.00%.  General:  Frail elderly female in no acute distress Head: Eyes  PERRLA, No xanthomas.   Normocephalic and atraumatic, oropharynx without edema or exudate. Dentition Lungs: Decreased breath sounds about 1/3 up the lung fields bilaterally.  Heart: HRRR S1 S2, no rub/gallop, Heart irregular rate and rhythm with S1, S2 1/6 systolic murmur LLSB. pulses are 2+ extrem.   Neck: No carotid bruits. No lymphadenopathy.  JVP 8-9 cm Abdomen: Bowel sounds present, abdomen soft and non-tender without masses or hernias noted. Msk:  No spine or cva tenderness. No weakness, no joint deformities or effusions. Extremities: No clubbing or cyanosis.  edema.  Neuro: Alert and oriented X 3. No focal deficits noted. Psych:  Good affect, responds appropriately Skin: No rashes or lesions noted.  Labs:   Lab Results  Component Value Date   WBC 11.4* 06/17/2012   HGB 10.2* 06/17/2012   HCT 32.3* 06/17/2012   MCV 92.6 06/17/2012   PLT 228 06/17/2012    Basename 06/17/12 0500  INR 2.29*     Lab 06/17/12 0500 06/15/12 1708  NA 137 --  K 3.6 --  CL 95* --  CO2 35* --  BUN 9 --  CREATININE 0.80 --  CALCIUM 9.0 --  PROT -- 6.2  BILITOT -- 0.4  ALKPHOS -- 99  ALT -- 11  AST -- 19  GLUCOSE 119* --   Magnesium  Date Value Range Status  05/19/2012 2.5  1.5 - 2.5 mg/dL Final    Basename 95/62/13 1536 06/15/12 1714  CKTOTAL -- --  CKMB -- --  TROPONINI <0.30 <0.30    Pro B Natriuretic peptide (BNP)  Date/Time Value Range Status  06/15/2012  5:14 PM 2390.0* 0 - 450 pg/mL Final  06/14/2012  2:00 PM 2527.00* <451 pg/mL Final   TSH  Date/Time Value Range Status  06/16/2012  8:29 AM 1.385  0.350 - 4.500  uIU/mL Final    Echo:  - Left ventricle: The cavity size was normal. Wall thickness was normal. Systolic function was normal. The estimated ejection fraction was in the range of 50% to 55%. - Mitral valve: A bioprosthesis was present. - Right atrium: The atrium was moderately dilated. - Tricuspid valve: Severe regurgitation. - Pulmonary arteries: PA peak pressure: 34mm Hg (S). - Pericardium, extracardiac: There was a left pleural effusion.   ECG:  Looks like atypical flutter with 2:1 AVB  Radiology:  Dg Chest 2 View 06/15/2012  *RADIOLOGY REPORT*  Clinical Data: Left chest pain, weakness  CHEST - 2 VIEW  Comparison: Chest x-ray of 06/14/2012  Findings: There has been some increase in opacities at the lung bases most consistent with slight increase in effusions and basilar atelectasis.  Mild cardiomegaly is stable.  A dual lead permanent pacemaker remains.  The bones are osteopenic.  IMPRESSION: Some increase in volume of small effusions and basilar atelectasis bilaterally.   Original Report Authenticated By: [name], M.D.    Dg Chest 2 View 06/14/2012  *RADIOLOGY REPORT*  Clinical Data: Post MVR October 2013, shortness of breath  CHEST - 2 VIEW  Comparison: 05/27/2012  Findings: Left subclavian sequential transvenous pacemaker leads project at right atrium and right ventricle. Enlargement of cardiac silhouette post MVR. Mediastinal contours normal. Minimal pulmonary vascular congestion. Bibasilar effusions and atelectasis, increased on left. No pulmonary infiltrate or pneumothorax. Surgical clips right upper quadrant question cholecystectomy. Additional surgical clips right axilla. Bones appear demineralized. Question tiny left renal calculus.  IMPRESSION: Bibasilar effusions atelectasis, increased on left since previous study. Mild enlargement of cardiac silhouette minimal pulmonary vascular congestion post MVR and pacemaker.  Original Report Authenticated By: [name], M.D.      ASSESSMENT AND PLAN:   The patient was seen today by Dr Shirlee Latch, the patient evaluated and the data reviewed.  Active Problems:  COPD  Shortness of breath  Atrial fibrillation  Encounter for long-term (current) use of anticoagulants  Acute on chronic diastolic heart failure  S/P mitral valve replacement with bioprosthetic valve  S/P Maze operation for atrial fibrillation  Pacemaker-[employer]  S/P MVR (mitral valve replacement)   Signed: Theodore Demark 06/17/2012, 5:12 PM Co-Sign MD  Patient seen with PA, agree with note.    1. CHF: Acute on chronic diastolic CHF.  She was admitted with volume overload, bilateral pleural effusions, and atypical atrial flutter.  She remains volume overloaded on exam.  - Agree with increasing Lasix to 40 mg IV every 8 hrs with strict I/Os.   - Follow creatinine.  2. Pleural effusions: Bilateral pleural effusions.  Will get CXR with decubitus views to see if thoracentesis would be an option. 3. Atrial flutter: Admission ECG showed atypical atrial flutter.  INR is therapeutic today but per Dr. Lubertha Basque last note, has not been consistently therapeutic.  Will repeat an ECG today.  Would consider TEE-guided cardioversion (could attempt pace termination prior to cardioversion) during this admission.  4. Asthma: Stable, continue prednisone/inhalers.  5. S/p MV replacement: Bioprosthetic, stable on echo this admission.  6. Tricuspid regurgitation: At least moderate, possibly severe on echo this admission. RV systolic function looks normal.  She now has a pacemaker, and the RV lead is one likely contributor to TR.  For now, treatment will be diuresis which may decrease RV size and decrease TR.  Will plan on repeat echo in a few months.   Marca Ancona 06/17/2012 5:28 PM

## 2012-06-18 DIAGNOSIS — J449 Chronic obstructive pulmonary disease, unspecified: Secondary | ICD-10-CM

## 2012-06-18 LAB — BASIC METABOLIC PANEL
CO2: 35 mEq/L — ABNORMAL HIGH (ref 19–32)
Chloride: 94 mEq/L — ABNORMAL LOW (ref 96–112)
GFR calc Af Amer: 87 mL/min — ABNORMAL LOW (ref >90)
Potassium: 3.9 mEq/L (ref 3.5–5.1)
Sodium: 135 mEq/L (ref 135–145)

## 2012-06-18 LAB — CBC
HCT: 33.8 % — ABNORMAL LOW (ref 36.0–46.0)
Hemoglobin: 10.2 g/dL — ABNORMAL LOW (ref 12.0–15.0)
MCV: 92.3 fL (ref 78.0–100.0)
RBC: 3.66 MIL/uL — ABNORMAL LOW (ref 3.87–5.11)
WBC: 10.9 10*3/uL — ABNORMAL HIGH (ref 4.0–10.5)

## 2012-06-18 LAB — PROTIME-INR: INR: 2.06 — ABNORMAL HIGH (ref 0.00–1.49)

## 2012-06-18 MED ORDER — WARFARIN SODIUM 2 MG PO TABS
2.0000 mg | ORAL_TABLET | Freq: Once | ORAL | Status: AC
  Administered 2012-06-18: 2 mg via ORAL
  Filled 2012-06-18: qty 1

## 2012-06-18 NOTE — Progress Notes (Signed)
Pt being tx to 2030.  Report has been called to receiving RN who denies any questions or concerns at this time.  Pt leaving unit via bed accompanied by RN and NT.  Pt appears in no acute distress. Nino Glow RN

## 2012-06-18 NOTE — Progress Notes (Signed)
[facility] PROGRESS NOTE  Nancy Bush YNW:[id number] DOB: 1925-11-17 DOA: 06/15/2012 PCP: [name], MD  Assessment/Plan: Acute on chronic diastolic CHF  -Ejection fraction 50-55%  -continue furosemide to 40 mg IV every 8  -Fluid restriction, daily weights  -neg 4454cc/ -3.8 kg for the admission  -Chest x-ray reveals unchanged small pleural effusions Atrial fibrillation s/p MAZE procedure  -Rate controlled  -Continue warfarin  -Continue metoprolol  -Consider switching metoprolol to carvedilol  -Possible cardioversion per cardiology Tricuspid regurgitation  -Echocardiogram on 06/16/2012 revealed worsening tricuspid regurgitation compared to TEE on 02/09/2012  -Appreciate cardiology recommendations Dyspnea -Likely multifactorial due to the patient's decompensated CHF, deconditioning, COPD, atrial fibrillation Status post mitral valve replacement  -bioprosthetic valve  COPD  -Continue roflumilast, low dose prednisone  Chest pain  -Resolved -troponins-negative -Chronic left bundle-branch block on EKG  Normocytic anemia  -Likely anemia of chronic disease  -Hemoglobin has remained stable    Disposition Plan:   SNF when medically stable      Procedures/Studies: Dg Chest 2 View  06/17/2012  *RADIOLOGY REPORT*  Clinical Data: Follow up effusions.  CHF.  CHEST - 2 VIEW  Comparison: 06/15/2012  Findings: Left chest wall pacer device is identified with leads in the right atrial appendage and right ventricle.  Heart size is normal.  Similar appearance of moderate bilateral pleural effusions with overlying atelectasis.  No interstitial edema.  No airspace consolidation.  IMPRESSION:  1.  No change in bilateral pleural effusions.   Original Report Authenticated By: [name], M.D.    Dg Chest 2 View  06/15/2012  *RADIOLOGY REPORT*  Clinical Data: Left chest pain, weakness  CHEST - 2 VIEW  Comparison: Chest x-ray of 06/14/2012  Findings: There has been  some increase in opacities at the lung bases most consistent with slight increase in effusions and basilar atelectasis.  Mild cardiomegaly is stable.  A dual lead permanent pacemaker remains.  The bones are osteopenic.  IMPRESSION: Some increase in volume of small effusions and basilar atelectasis bilaterally.   Original Report Authenticated By: [name], M.D.    Dg Chest 2 View  06/14/2012  *RADIOLOGY REPORT*  Clinical Data: Post MVR October 2013, shortness of breath  CHEST - 2 VIEW  Comparison: 05/27/2012  Findings: Left subclavian sequential transvenous pacemaker leads project at right atrium and right ventricle. Enlargement of cardiac silhouette post MVR. Mediastinal contours normal. Minimal pulmonary vascular congestion. Bibasilar effusions and atelectasis, increased on left. No pulmonary infiltrate or pneumothorax. Surgical clips right upper quadrant question cholecystectomy. Additional surgical clips right axilla. Bones appear demineralized. Question tiny left renal calculus.  IMPRESSION: Bibasilar effusions atelectasis, increased on left since previous study. Mild enlargement of cardiac silhouette minimal pulmonary vascular congestion post MVR and pacemaker.   Original Report Authenticated By: [name], M.D.    Dg Chest 2 View  05/27/2012  *RADIOLOGY REPORT*  Clinical Data: Weakness.  Pleural effusion.  CHEST - 2 VIEW  Comparison: PA and lateral chest 05/24/2012.  Findings: Small bilateral pleural effusions and basilar atelectasis persist.  The patient's right pleural effusion appears decreased in size. Small right apical pneumothorax, less than 5%, has decreased in size.  Associated basilar atelectasis is noted.  There is cardiomegaly without edema.  IMPRESSION:  1.  Some decrease in a small right pleural effusion.  Tiny right apical pneumothorax is also decreased in size. 2.  No change in a very small left pleural effusion.   Original Report Authenticated By: [name]. [name], M.D.    Dg  Chest 2 View  05/24/2012  *RADIOLOGY REPORT*  Clinical Data: Pacemaker placement  CHEST - 2 VIEW  Comparison: 05/23/2012  Findings: Stable asymmetric elevation of the right hemidiaphragm. Tiny bilateral pleural effusions persist.  Emphysema noted.  Tiny right apical pneumothorax is not substantially changed in the interval.  Left dual lead permanent pacemaker again noted. The cardiopericardial silhouette is enlarged.  IMPRESSION: Emphysema with tiny right apical pneumothorax, unchanged.  Tiny bilateral pleural effusions.   Original Report Authenticated By: [name], M.D.    Ct Head Wo Contrast  05/26/2012  *RADIOLOGY REPORT*  Clinical Data: Change in mental status.   Pt was ambulatory and talking yesterday. Not responding today.  CT HEAD WITHOUT CONTRAST  Technique:  Contiguous axial images were obtained from the base of the skull through the vertex without contrast.  Comparison: None.  Findings: The brain stem, cerebellum, cerebral peduncles, thalami, basal ganglia, basilar cisterns, and ventricular system appear unremarkable.  Minimal periventricular white matter hypodensity suggest mild chronic ischemic microvascular white matter disease.  No intracranial hemorrhage, mass lesion, or acute infarction is identified.  Incidental failure of fusion of the posterior arch of C1 noted.  IMPRESSION:  1.  No acute intracranial findings. 2.  Mild chronic ischemic microvascular white matter disease.   Original Report Authenticated By: [name], M.D.    Dg Chest Bilateral Decubitus  06/17/2012  *RADIOLOGY REPORT*  Clinical Data: Bilateral pleural effusions.  CHEST - BILATERAL DECUBITUS VIEW 6:05 p.m.  Comparison: 06/17/2012 at 5:56 p.m.  Findings: The left pleural effusion layers laterally in the decubitus position with a small amount loculated at the base.  There is only a tiny right effusion with a tiny amount of layering laterally at the base but not extending superiorly.  There is also chronic  blunting of the costophrenic angles bilaterally.  IMPRESSION: Partial layering of the bilateral pleural effusions laterally.   Original Report Authenticated By: Francene Boyers, M.D.    Dg Chest Port 1 View  05/23/2012  *RADIOLOGY REPORT*  Clinical Data: Post mitral valve replacement  PORTABLE CHEST - 1 VIEW  Comparison: 05/22/2012; 05/21/2012; 05/20/2012  Findings: Unchanged cardiac silhouette and mediastinal contours post mitral valve replacement.  Interval removal of right-sided chest tubes with unchanged tiny right apical pneumothorax. Unchanged small bilateral effusions, right greater than left. There is persistent mild elevation right hemidiaphragm.  Unchanged bibasilar heterogeneous opacities.  No new focal airspace opacity. No definite evidence of pulmonary edema.  Unchanged bones.  Post cholecystectomy.  IMPRESSION: 1.  Interval removal of right-sided chest tubes with unchanged tiny right apical pneumothorax. 2.  Unchanged small bilateral effusions and bibasilar opacities, right greater than left. 3.  No definite evidence of pulmonary edema.   Original Report Authenticated By: Waynard Reeds, M.D.    Dg Chest Portable 1 View  05/22/2012  *RADIOLOGY REPORT*  Clinical Data: Post pacemaker insertion  PORTABLE CHEST - 1 VIEW  Comparison: 05/21/2012; 05/20/2012  Findings:  Grossly unchanged cardiac silhouette and mediastinal contours post mitral valve repair.  Interval removal of left jugular approach central venous catheter.  Interval placement of left anterior chest wall dual lead pacemaker with tips overlying expected location of the right atrium and ventricle.  Two right-sided chest tubes are unchanged in positioning.  Note is made of a tiny right apical pneumothorax.  There is persistent mild elevation of the right hemidiaphragm.  Lung volumes appear hyperexpanded.  Minimal improved aeration of the bilateral lung bases with persistent bibasilar opacities favored to represent atelectasis  versus scar.  Unchanged bones.  Post cholecystectomy.  IMPRESSION: 1.  Interval placement of left anterior chest wall dual lead pacemaker with tips overlying the expected location of the right atrium and ventricle. 2.  Stable positioning of two right-sided chest tubes, now with tiny right apical pneumothorax. 3.  Overall improved aeration of the lungs with persistent bibasilar opacities favored to represent atelectasis.   Original Report Authenticated By: Waynard Reeds, M.D.    Dg Chest Port 1 View  05/21/2012  *RADIOLOGY REPORT*  Clinical Data: Chest tube  PORTABLE CHEST - 1 VIEW  Comparison: 05/20/2012; 05/19/2012; 05/18/2012  Findings:  Grossly unchanged cardiac silhouette and mediastinal contours post aortic valve repair.  Interval removal of a left jugular approach central venous catheter.  A remaining left jugular approach central venous catheter tip overlies the superior cavoatrial junction. Stable positioning of two right-sided chest tubes.  No definite pneumothorax.  Unchanged small bilateral effusions and bibasilar opacities, right greater than left. No new focal airspace opacities.  No definite evidence of edema.  Unchanged bones.  Post cholecystectomy.  IMPRESSION: 1.  Interval removal of a left jugular approach central venous catheter.  Otherwise, stable position of support apparatus.  No pneumothorax. 2.  Unchanged small bilateral effusions and bibasilar opacities, right greater than left, possibly atelectasis   Original Report Authenticated By: Waynard Reeds, M.D.    Dg Chest Portable 1 View In Am  05/20/2012  *RADIOLOGY REPORT*  Clinical Data: Post mitral valve replacement.  PORTABLE CHEST - 1 VIEW  Comparison: 05/19/2012.  Findings: Post valve replacement.  Right-sided chest tube remains in place.  No gross pneumothorax.  Swan-Ganz catheter has been removed.  Introducer remains in place with the tip projecting over the aortic arch level possibly at the left bracheocephalic vein level.  Kink at the  level of the skin insertion site.  Left central line tip mid superior vena cava level.  Cardiomegaly.  Basilar subsegmental atelectasis with small bilateral pleural effusions suspected.  Mild central pulmonary vascular prominence without pulmonary edema.  IMPRESSION: Swan-Ganz catheter removed.  Introducer remains in place as noted above.  Basilar atelectatic changes with small pleural effusions suspected.  Cardiomegaly post valve replacement.   Original Report Authenticated By: Fuller Canada, M.D.          Subjective: Patient continues to complain of shortness of breath, but states that it is approximately 25% better than at the time of admission. Denies any chest pain, nausea, vomiting, dizziness, abdominal pain, dysuria, rashes. No fevers or chills.  Objective: Filed Vitals:   06/17/12 1406 06/17/12 2017 06/18/12 0428 06/18/12 1033  BP: 112/52 124/60 122/57 111/69  Pulse: 74 70 70 71  Temp: 98.7 F (37.1 C) 98.2 F (36.8 C) 98.1 F (36.7 C)   TempSrc: Oral Oral Oral   Resp: 18 18 18    Height:      Weight:   46.04 kg (101 lb 8 oz)   SpO2: 100% 99% 99%     Intake/Output Summary (Last 24 hours) at 06/18/12 1117 Last data filed at 06/18/12 8413  Gross per 24 hour  Intake    300 ml  Output   2500 ml  Net  -2200 ml   Weight change: -2.06 kg (-4 lb 8.7 oz) Exam:   General:  Pt is alert, follows commands appropriately, not in acute distress  HEENT: No icterus, No thrush,  [place]/AT  Cardiovascular: Irregular, S1/S2, no rubs  Respiratory: Bibasilar crackles. No wheezes or rhonchi. Good air  movement.  Abdomen: Soft/+BS, non tender, non distended, no guarding  Extremities: 2+ edema, No lymphangitis, No petechiae, No rashes, no synovitis  Data Reviewed: Basic Metabolic Panel:  Lab 06/18/12 4098 06/17/12 0500 06/15/12 1708 06/14/12 1400  NA 135 137 130* 131*  K 3.9 3.6 4.6 4.7  CL 94* 95* 92* 96  CO2 35* 35* 30 30  GLUCOSE 113* 119* 137* 105*  BUN 8 9 10 8   CREATININE  0.78 0.80 0.97 0.80  CALCIUM 8.8 9.0 9.2 9.2  MG -- -- -- --  PHOS -- -- -- --   Liver Function Tests:  Lab 06/15/12 1708 06/14/12 1400  AST 19 19  ALT 11 12  ALKPHOS 99 77  BILITOT 0.4 0.5  PROT 6.2 5.7*  ALBUMIN 3.1* 3.2*   No results found for this basename: LIPASE:5,AMYLASE:5 in the last 168 hours No results found for this basename: AMMONIA:5 in the last 168 hours CBC:  Lab 06/18/12 0430 06/17/12 0500 06/15/12 1708 06/14/12 1400  WBC 10.9* 11.4* 9.4 8.9  NEUTROABS -- -- -- --  HGB 10.2* 10.2* 10.6* 10.5*  HCT 33.8* 32.3* 33.7* 30.8*  MCV 92.3 92.6 90.3 86.8  PLT 207 228 237 245   Cardiac Enzymes:  Lab 06/17/12 2016 06/17/12 1536 06/15/12 1714  CKTOTAL -- -- --  CKMB -- -- --  CKMBINDEX -- -- --  TROPONINI <0.30 <0.30 <0.30   BNP: No components found with this basename: POCBNP:5 CBG: No results found for this basename: GLUCAP:5 in the last 168 hours  Recent Results (from the past 240 hour(s))  MRSA PCR SCREENING     Status: Normal   Collection Time   06/15/12 10:29 PM      Component Value Range Status Comment   MRSA by PCR NEGATIVE  NEGATIVE Final      Scheduled Meds:   . aspirin EC  81 mg Oral Daily  . calcium-vitamin D  1 tablet Oral BID  . docusate sodium  100 mg Oral BID  . feeding supplement  1 Container Oral TID BM  . folic acid  2 mg Oral Daily  . furosemide  40 mg Intravenous Q8H  . LORazepam  0.5 mg Intravenous Once  . metoprolol  75 mg Oral BID  . potassium chloride  40 mEq Oral Daily  . predniSONE  2.5 mg Oral QODAY  . roflumilast  500 mcg Oral Daily  . sodium chloride  3 mL Intravenous Q12H  . sodium chloride  3 mL Intravenous Q12H  . [COMPLETED] warfarin  2 mg Oral ONCE-1800  . Warfarin - Pharmacist Dosing Inpatient   Does not apply q1800  . [DISCONTINUED] furosemide  40 mg Intravenous Q8H  . [DISCONTINUED] potassium chloride SA  20 mEq Oral Daily   Continuous Infusions:   . sodium chloride 10 mL/hr at 06/17/12 0530     Korey Prashad,  Shameria Trimarco, DO  [facility] Pager [phone]  If 7PM-7AM, please contact night-coverage [email or web address] Password [email or web address] 06/18/2012, 11:17 AM   LOS: 3 days

## 2012-06-18 NOTE — Progress Notes (Signed)
Pt transferred to 2030 from 4700; pt alert and oriented; family at bedside; VSS; no complaints of pain; will cont. To monitor.

## 2012-06-18 NOTE — Progress Notes (Signed)
SUBJECTIVE:  Very frail appearing.  She still reports that she is short of breath   PHYSICAL EXAM Filed Vitals:   06/17/12 1100 06/17/12 1406 06/17/12 2017 06/18/12 0428  BP: 114/54 112/52 124/60 122/57  Pulse: 73 74 70 70  Temp:  98.7 F (37.1 C) 98.2 F (36.8 C) 98.1 F (36.7 C)  TempSrc:  Oral Oral Oral  Resp:  18 18 18   Height:      Weight:    101 lb 8 oz (46.04 kg)  SpO2:  100% 99% 99%   General:  No distress Lungs:  Decreased breath sounds Heart:  RRR Abdomen:  Positive bowel sounds, no rebound no guarding Extremities:  No edema.  LABS: Lab Results  Component Value Date   TROPONINI <0.30 06/17/2012   Results for orders placed during the hospital encounter of 06/15/12 (from the past 24 hour(s))  TROPONIN I     Status: Normal   Collection Time   06/17/12  3:36 PM      Component Value Range   Troponin I <0.30  <0.30 ng/mL  TROPONIN I     Status: Normal   Collection Time   06/17/12  8:16 PM      Component Value Range   Troponin I <0.30  <0.30 ng/mL  BASIC METABOLIC PANEL     Status: Abnormal   Collection Time   06/18/12  4:30 AM      Component Value Range   Sodium 135  135 - 145 mEq/L   Potassium 3.9  3.5 - 5.1 mEq/L   Chloride 94 (*) 96 - 112 mEq/L   CO2 35 (*) 19 - 32 mEq/L   Glucose, Bld 113 (*) 70 - 99 mg/dL   BUN 8  6 - 23 mg/dL   Creatinine, Ser 2.95  0.50 - 1.10 mg/dL   Calcium 8.8  8.4 - 62.1 mg/dL   GFR calc non Af Amer 75 (*) >90 mL/min   GFR calc Af Amer 87 (*) >90 mL/min  CBC     Status: Abnormal   Collection Time   06/18/12  4:30 AM      Component Value Range   WBC 10.9 (*) 4.0 - 10.5 K/uL   RBC 3.66 (*) 3.87 - 5.11 MIL/uL   Hemoglobin 10.2 (*) 12.0 - 15.0 g/dL   HCT 30.8 (*) 65.7 - 84.6 %   MCV 92.3  78.0 - 100.0 fL   MCH 27.9  26.0 - 34.0 pg   MCHC 30.2  30.0 - 36.0 g/dL   RDW 96.2 (*) 95.2 - 84.1 %   Platelets 207  150 - 400 K/uL  PROTIME-INR     Status: Abnormal   Collection Time   06/18/12  4:30 AM      Component Value  Range   Prothrombin Time 22.4 (*) 11.6 - 15.2 seconds   INR 2.06 (*) 0.00 - 1.49    Intake/Output Summary (Last 24 hours) at 06/18/12 0805 Last data filed at 06/18/12 0800  Gross per 24 hour  Intake    420 ml  Output   2476 ml  Net  -2056 ml    ASSESSMENT AND PLAN:  CHF:  Acute on chronic diastolic CHF.  Continue current diuretic IV today.  Atrial flutter:  We could consider TEE cardioversion prior to discharge.  However, today she is quite frail and I would like to defer and see how she responds over the weekend.   S/p MV replacement:  Bioprosthetic, stable on echo this  admission.   Tricuspid regurgitation:  Continue diuresis and we will follow this.  (Note:  She requested transfer to 2000.  I will arrange this.)  Rollene Rotunda 06/18/2012 8:05 AM

## 2012-06-18 NOTE — Progress Notes (Signed)
ANTICOAGULATION CONSULT NOTE - Follow Up Consult  Pharmacy Consult for Coumadin Indication: atrial fibrillation & MVR  Allergies  Allergen Reactions  . Promethazine Hcl Other (See Comments)    unknown  . Sulfonamide Derivatives Other (See Comments)    unknown  . Tequin Other (See Comments)    hallucinations     Patient Measurements: Height: 5' (152.4 cm) Weight: 101 lb 8 oz (46.04 kg) (scale b) IBW/kg (Calculated) : 45.5  Heparin Dosing Weight:   Vital Signs: Temp: 97.7 F (36.5 C) (11/15 1217) Temp src: Oral (11/15 1217) BP: 116/58 mmHg (11/15 1217) Pulse Rate: 68  (11/15 1217)  Labs:  Basename 06/18/12 0430 06/17/12 2016 06/17/12 1536 06/17/12 0500 06/16/12 0829 06/15/12 1714 06/15/12 1708  HGB 10.2* -- -- 10.2* -- -- --  HCT 33.8* -- -- 32.3* -- -- 33.7*  PLT 207 -- -- 228 -- -- 237  APTT -- -- -- -- -- -- --  LABPROT 22.4* -- -- 24.2* 27.6* -- --  INR 2.06* -- -- 2.29* 2.73* -- --  HEPARINUNFRC -- -- -- -- -- -- --  CREATININE 0.78 -- -- 0.80 -- -- 0.97  CKTOTAL -- -- -- -- -- -- --  CKMB -- -- -- -- -- -- --  TROPONINI -- <0.30 <0.30 -- -- <0.30 --    Estimated Creatinine Clearance: 38.3 ml/min (by C-G formula based on Cr of 0.78).   Medications:  Scheduled:    . aspirin EC  81 mg Oral Daily  . calcium-vitamin D  1 tablet Oral BID  . docusate sodium  100 mg Oral BID  . feeding supplement  1 Container Oral TID BM  . folic acid  2 mg Oral Daily  . furosemide  40 mg Intravenous Q8H  . LORazepam  0.5 mg Intravenous Once  . metoprolol  75 mg Oral BID  . potassium chloride  40 mEq Oral Daily  . predniSONE  2.5 mg Oral QODAY  . roflumilast  500 mcg Oral Daily  . sodium chloride  3 mL Intravenous Q12H  . sodium chloride  3 mL Intravenous Q12H  . [COMPLETED] warfarin  2 mg Oral ONCE-1800  . Warfarin - Pharmacist Dosing Inpatient   Does not apply q1800  . [DISCONTINUED] furosemide  40 mg Intravenous Q8H  . [DISCONTINUED] potassium chloride SA  20 mEq Oral  Daily    Assessment: 78yo female with AFib and bioprosthetic MVR, admitted with supratherapeutic INR, now within goal range.  INR 2.06 this AM- down further reflecting held doses.  Though pt has been refusing some medications, Coumadin doses have been taken since resumed.  No bleeding problems noted.  Goal of Therapy:  INR 2-3 Monitor platelets by anticoagulation protocol: Yes   Plan:  1.  Coumadin 2mg  2.  F/U in AM  Marisue Humble, PharmD Clinical Pharmacist [facility]

## 2012-06-19 LAB — BASIC METABOLIC PANEL
BUN: 10 mg/dL (ref 6–23)
Chloride: 93 mEq/L — ABNORMAL LOW (ref 96–112)
GFR calc Af Amer: 75 mL/min — ABNORMAL LOW (ref >90)
GFR calc non Af Amer: 64 mL/min — ABNORMAL LOW (ref >90)
Potassium: 3.8 mEq/L (ref 3.5–5.1)

## 2012-06-19 LAB — PROTIME-INR
INR: 2.34 — ABNORMAL HIGH (ref 0.00–1.49)
Prothrombin Time: 24.6 seconds — ABNORMAL HIGH (ref 11.6–15.2)

## 2012-06-19 MED ORDER — ALBUTEROL SULFATE (5 MG/ML) 0.5% IN NEBU
2.5000 mg | INHALATION_SOLUTION | Freq: Once | RESPIRATORY_TRACT | Status: AC
  Administered 2012-06-19: 2.5 mg via RESPIRATORY_TRACT
  Filled 2012-06-19: qty 0.5

## 2012-06-19 MED ORDER — MEGESTROL ACETATE 400 MG/10ML PO SUSP
400.0000 mg | Freq: Every day | ORAL | Status: DC
  Administered 2012-06-19 – 2012-06-23 (×5): 400 mg via ORAL
  Filled 2012-06-19 (×6): qty 10

## 2012-06-19 MED ORDER — FUROSEMIDE 10 MG/ML IJ SOLN
40.0000 mg | Freq: Two times a day (BID) | INTRAMUSCULAR | Status: DC
  Administered 2012-06-19 – 2012-06-22 (×7): 40 mg via INTRAVENOUS
  Filled 2012-06-19 (×8): qty 4

## 2012-06-19 MED ORDER — WARFARIN SODIUM 1 MG PO TABS
1.0000 mg | ORAL_TABLET | Freq: Once | ORAL | Status: AC
  Administered 2012-06-19: 1 mg via ORAL
  Filled 2012-06-19: qty 1

## 2012-06-19 NOTE — Progress Notes (Signed)
Received critical value CO2=40. Pt resting on the bed. Resp=14, SpO2= 100 on 2L . Symmetrical chest expansion without labor breathing. Notified on-call MD. Received no order at this time. Will continue to monitor.

## 2012-06-19 NOTE — Progress Notes (Signed)
Pt complains of SOB. VSS. O2 @ 100% on 2L. Offered pt Xopenex INH. Pt wanted to wait until she was finished with her grape juice before admin. Pt in no acute distress. Admin roughly 30 mins later. Pt verbalized relief from SOB. Will continue to monitor pt closely.

## 2012-06-19 NOTE — Progress Notes (Signed)
Patient ID: LOA IDLER, female   DOB: 04/09/1926, 78 y.o.   MRN: [id number]   Patient Name: Nancy Bush Date of Encounter: 06/19/2012    SUBJECTIVE  Is feeling a little bit better today. Still very weak and frail as outlined by previous notes. Her shortness of breath is improved. Remarkably, she is converted to sinus rhythm with V. pacing. Telemetry reviewed with technician. She diuresed 800 cc yesterday.  CURRENT MEDS    . albuterol  2.5 mg Nebulization Once  . aspirin EC  81 mg Oral Daily  . calcium-vitamin D  1 tablet Oral BID  . docusate sodium  100 mg Oral BID  . feeding supplement  1 Container Oral TID BM  . folic acid  2 mg Oral Daily  . furosemide  40 mg Intravenous Q8H  . LORazepam  0.5 mg Intravenous Once  . metoprolol  75 mg Oral BID  . potassium chloride  40 mEq Oral Daily  . predniSONE  2.5 mg Oral QODAY  . roflumilast  500 mcg Oral Daily  . sodium chloride  3 mL Intravenous Q12H  . sodium chloride  3 mL Intravenous Q12H  . [COMPLETED] warfarin  2 mg Oral ONCE-1800  . Warfarin - Pharmacist Dosing Inpatient   Does not apply q1800    OBJECTIVE  Filed Vitals:   06/18/12 1033 06/18/12 1217 06/18/12 2006 06/19/12 0501  BP: 111/69 116/58 122/59 130/64  Pulse: 71 68 70 95  Temp:  97.7 F (36.5 C) 97.7 F (36.5 C) 98.2 F (36.8 C)  TempSrc:  Oral Oral Oral  Resp:   16 16  Height:      Weight:    99 lb 6.4 oz (45.088 kg)  SpO2: 100% 100% 99% 100%    Intake/Output Summary (Last 24 hours) at 06/19/12 1045 Last data filed at 06/19/12 0700  Gross per 24 hour  Intake    364 ml  Output   1200 ml  Net   -836 ml   Filed Weights   06/17/12 0638 06/18/12 0428 06/19/12 0501  Weight: 106 lb 0.7 oz (48.1 kg) 101 lb 8 oz (46.04 kg) 99 lb 6.4 oz (45.088 kg)    PHYSICAL EXAM  General: Pleasant, NAD. Frail, sitting at bedside Neuro: Alert and oriented X 3. Moves all extremities spontaneously. Psych: Depressed affect HEENT:  Normal  Neck: Supple  without bruits JVD Lungs:  Resp regular and unlabored, decreased breath sounds in both bases Heart: RRR no s3, s4, or murmurs. Abdomen: Soft, non-tender, non-distended, BS + x 4.  Extremities: No clubbing, cyanosis or edema. DP/PT/Radials 2+ and equal bilaterally.  Accessory Clinical Findings  CBC  Basename 06/18/12 0430 06/17/12 0500  WBC 10.9* 11.4*  NEUTROABS -- --  HGB 10.2* 10.2*  HCT 33.8* 32.3*  MCV 92.3 92.6  PLT 207 228   Basic Metabolic Panel  Basename 06/19/12 0511 06/18/12 0430  NA 138 135  K 3.8 3.9  CL 93* 94*  CO2 40* 35*  GLUCOSE 138* 113*  BUN 10 8  CREATININE 0.82 0.78  CALCIUM 9.5 8.8  MG -- --  PHOS -- --   Liver Function Tests No results found for this basename: AST:2,ALT:2,ALKPHOS:2,BILITOT:2,PROT:2,ALBUMIN:2 in the last 72 hours No results found for this basename: LIPASE:2,AMYLASE:2 in the last 72 hours Cardiac Enzymes  Basename 06/17/12 2016 06/17/12 1536  CKTOTAL -- --  CKMB -- --  CKMBINDEX -- --  TROPONINI <0.30 <0.30   BNP No components found with this basename: POCBNP:3 D-Dimer  No results found for this basename: DDIMER:2 in the last 72 hours Hemoglobin A1C No results found for this basename: HGBA1C in the last 72 hours Fasting Lipid Panel No results found for this basename: CHOL,HDL,LDLCALC,TRIG,CHOLHDL,LDLDIRECT in the last 72 hours Thyroid Function Tests No results found for this basename: TSH,T4TOTAL,FREET3,T3FREE,THYROIDAB in the last 72 hours  TELE  Sinus rhythm with V. pacing.  ECG    Radiology/Studies  Dg Chest 2 View  06/17/2012  *RADIOLOGY REPORT*  Clinical Data: Follow up effusions.  CHF.  CHEST - 2 VIEW  Comparison: 06/15/2012  Findings: Left chest Tristian Sickinger pacer device is identified with leads in the right atrial appendage and right ventricle.  Heart size is normal.  Similar appearance of moderate bilateral pleural effusions with overlying atelectasis.  No interstitial edema.  No airspace consolidation.   IMPRESSION:  1.  No change in bilateral pleural effusions.   Original Report Authenticated By: [name], M.D.    Dg Chest 2 View  06/15/2012  *RADIOLOGY REPORT*  Clinical Data: Left chest pain, weakness  CHEST - 2 VIEW  Comparison: Chest x-ray of 06/14/2012  Findings: There has been some increase in opacities at the lung bases most consistent with slight increase in effusions and basilar atelectasis.  Mild cardiomegaly is stable.  A dual lead permanent pacemaker remains.  The bones are osteopenic.  IMPRESSION: Some increase in volume of small effusions and basilar atelectasis bilaterally.   Original Report Authenticated By: [name], M.D.    Dg Chest 2 View  06/14/2012  *RADIOLOGY REPORT*  Clinical Data: Post MVR October 2013, shortness of breath  CHEST - 2 VIEW  Comparison: 05/27/2012  Findings: Left subclavian sequential transvenous pacemaker leads project at right atrium and right ventricle. Enlargement of cardiac silhouette post MVR. Mediastinal contours normal. Minimal pulmonary vascular congestion. Bibasilar effusions and atelectasis, increased on left. No pulmonary infiltrate or pneumothorax. Surgical clips right upper quadrant question cholecystectomy. Additional surgical clips right axilla. Bones appear demineralized. Question tiny left renal calculus.  IMPRESSION: Bibasilar effusions atelectasis, increased on left since previous study. Mild enlargement of cardiac silhouette minimal pulmonary vascular congestion post MVR and pacemaker.   Original Report Authenticated By: [name], M.D.    Dg Chest 2 View  05/27/2012  *RADIOLOGY REPORT*  Clinical Data: Weakness.  Pleural effusion.  CHEST - 2 VIEW  Comparison: PA and lateral chest 05/24/2012.  Findings: Small bilateral pleural effusions and basilar atelectasis persist.  The patient's right pleural effusion appears decreased in size. Small right apical pneumothorax, less than 5%, has decreased in size.  Associated basilar atelectasis is  noted.  There is cardiomegaly without edema.  IMPRESSION:  1.  Some decrease in a small right pleural effusion.  Tiny right apical pneumothorax is also decreased in size. 2.  No change in a very small left pleural effusion.   Original Report Authenticated By: [name]. [name], M.D.    Dg Chest 2 View  05/24/2012  *RADIOLOGY REPORT*  Clinical Data: Pacemaker placement  CHEST - 2 VIEW  Comparison: 05/23/2012  Findings: Stable asymmetric elevation of the right hemidiaphragm. Tiny bilateral pleural effusions persist.  Emphysema noted.  Tiny right apical pneumothorax is not substantially changed in the interval.  Left dual lead permanent pacemaker again noted. The cardiopericardial silhouette is enlarged.  IMPRESSION: Emphysema with tiny right apical pneumothorax, unchanged.  Tiny bilateral pleural effusions.   Original Report Authenticated By: [name], M.D.    Ct Head Wo Contrast  05/26/2012  *RADIOLOGY REPORT*  Clinical Data:  Change in mental status.   Pt was ambulatory and talking yesterday. Not responding today.  CT HEAD WITHOUT CONTRAST  Technique:  Contiguous axial images were obtained from the base of the skull through the vertex without contrast.  Comparison: None.  Findings: The brain stem, cerebellum, cerebral peduncles, thalami, basal ganglia, basilar cisterns, and ventricular system appear unremarkable.  Minimal periventricular white matter hypodensity suggest mild chronic ischemic microvascular white matter disease.  No intracranial hemorrhage, mass lesion, or acute infarction is identified.  Incidental failure of fusion of the posterior arch of C1 noted.  IMPRESSION:  1.  No acute intracranial findings. 2.  Mild chronic ischemic microvascular white matter disease.   Original Report Authenticated By: [name], M.D.    Dg Chest Bilateral Decubitus  06/17/2012  *RADIOLOGY REPORT*  Clinical Data: Bilateral pleural effusions.  CHEST - BILATERAL DECUBITUS VIEW 6:05 p.m.   Comparison: 06/17/2012 at 5:56 p.m.  Findings: The left pleural effusion layers laterally in the decubitus position with a small amount loculated at the base.  There is only a tiny right effusion with a tiny amount of layering laterally at the base but not extending superiorly.  There is also chronic blunting of the costophrenic angles bilaterally.  IMPRESSION: Partial layering of the bilateral pleural effusions laterally.   Original Report Authenticated By: Francene Boyers, M.D.    Dg Chest Port 1 View  05/23/2012  *RADIOLOGY REPORT*  Clinical Data: Post mitral valve replacement  PORTABLE CHEST - 1 VIEW  Comparison: 05/22/2012; 05/21/2012; 05/20/2012  Findings: Unchanged cardiac silhouette and mediastinal contours post mitral valve replacement.  Interval removal of right-sided chest tubes with unchanged tiny right apical pneumothorax. Unchanged small bilateral effusions, right greater than left. There is persistent mild elevation right hemidiaphragm.  Unchanged bibasilar heterogeneous opacities.  No new focal airspace opacity. No definite evidence of pulmonary edema.  Unchanged bones.  Post cholecystectomy.  IMPRESSION: 1.  Interval removal of right-sided chest tubes with unchanged tiny right apical pneumothorax. 2.  Unchanged small bilateral effusions and bibasilar opacities, right greater than left. 3.  No definite evidence of pulmonary edema.   Original Report Authenticated By: Waynard Reeds, M.D.    Dg Chest Portable 1 View  05/22/2012  *RADIOLOGY REPORT*  Clinical Data: Post pacemaker insertion  PORTABLE CHEST - 1 VIEW  Comparison: 05/21/2012; 05/20/2012  Findings:  Grossly unchanged cardiac silhouette and mediastinal contours post mitral valve repair.  Interval removal of left jugular approach central venous catheter.  Interval placement of left anterior chest Laquita Harlan dual lead pacemaker with tips overlying expected location of the right atrium and ventricle.  Two right-sided chest tubes are unchanged in  positioning.  Note is made of a tiny right apical pneumothorax.  There is persistent mild elevation of the right hemidiaphragm.  Lung volumes appear hyperexpanded.  Minimal improved aeration of the bilateral lung bases with persistent bibasilar opacities favored to represent atelectasis versus scar. Unchanged bones.  Post cholecystectomy.  IMPRESSION: 1.  Interval placement of left anterior chest Kmya Placide dual lead pacemaker with tips overlying the expected location of the right atrium and ventricle. 2.  Stable positioning of two right-sided chest tubes, now with tiny right apical pneumothorax. 3.  Overall improved aeration of the lungs with persistent bibasilar opacities favored to represent atelectasis.   Original Report Authenticated By: Waynard Reeds, M.D.    Dg Chest Port 1 View  05/21/2012  *RADIOLOGY REPORT*  Clinical Data: Chest tube  PORTABLE CHEST - 1 VIEW  Comparison:  05/20/2012; 05/19/2012; 05/18/2012  Findings:  Grossly unchanged cardiac silhouette and mediastinal contours post aortic valve repair.  Interval removal of a left jugular approach central venous catheter.  A remaining left jugular approach central venous catheter tip overlies the superior cavoatrial junction. Stable positioning of two right-sided chest tubes.  No definite pneumothorax.  Unchanged small bilateral effusions and bibasilar opacities, right greater than left. No new focal airspace opacities.  No definite evidence of edema.  Unchanged bones.  Post cholecystectomy.  IMPRESSION: 1.  Interval removal of a left jugular approach central venous catheter.  Otherwise, stable position of support apparatus.  No pneumothorax. 2.  Unchanged small bilateral effusions and bibasilar opacities, right greater than left, possibly atelectasis   Original Report Authenticated By: Waynard Reeds, M.D.     ASSESSMENT AND PLAN  Active Problems:  COPD  Shortness of breath  Atrial fibrillation  Encounter for long-term (current) use of  anticoagulants  Acute on chronic diastolic heart failure  S/P mitral valve replacement with bioprosthetic valve  S/P Maze operation for atrial fibrillation  Pacemaker-[employer]  S/P MVR (mitral valve replacement)    Doing better today but still very weak and frail. Good diuresis the last 2 days with a slight increase in BUN and creatinine. CO2 is 40. She is back in sinus rhythm with the pacing which should help with cardiac output. I will cut back on her diuretic regimen and followup electrolytes and renal function in the morning. We need to be aggressive pushing nutritional needs with her catabolic state.  Signed, Valera Castle MD

## 2012-06-19 NOTE — Progress Notes (Signed)
CRITICAL VALUE ALERT  Critical value received:  CO2  Date of notification:  06/19/2012 Time of notification:  0626    Critical value read back: yes Nurse who received alert:  Dahlia Client MD notified (1st page): Claiborne Billings Time of first page: 0629   Responding MD:  Claiborne Billings Time MD responded:  [phone]

## 2012-06-19 NOTE — Evaluation (Signed)
Physical Therapy Evaluation Patient Details Name: Nancy Bush MRN: [id number] DOB: 1925-08-25 Today's Date: 06/19/2012 Time: 4098-1191 PT Time Calculation (min): 29 min  PT Assessment / Plan / Recommendation Clinical Impression  pt adm from [facility] for SOB and overt fluid overload.  Recently s/p MVR and still recovering from that surgery.  Will continue to see her on acute.  May still need to return for further therapy    PT Assessment  Patient needs continued PT services    Follow Up Recommendations  SNF;Supervision for mobility/OOB    Does the patient have the potential to tolerate intense rehabilitation      Barriers to Discharge Decreased caregiver support      Equipment Recommendations  None recommended by PT    Recommendations for Other Services     Frequency Min 3X/week    Precautions / Restrictions Precautions Precautions: Fall Restrictions Weight Bearing Restrictions: No   Pertinent Vitals/Pain EHR 99bpm, SaO2 during and just after gait was 97% on RA      Mobility  Bed Mobility Bed Mobility: Supine to Sit;Sitting - Scoot to Edge of Bed;Sit to Supine Supine to Sit: 5: Supervision;HOB elevated Sitting - Scoot to Edge of Bed: 5: Supervision Sit to Supine: 5: Supervision Details for Bed Mobility Assistance: no assist necessary Transfers Transfers: Sit to Stand;Stand to Sit Sit to Stand: 4: Min guard;With upper extremity assist;From bed Stand to Sit: With upper extremity assist;To bed;4: Min guard Details for Transfer Assistance: Minimal use of arms. Ambulation/Gait Ambulation/Gait Assistance: 5: Supervision;4: Min guard Ambulation Distance (Feet): 110 Feet Assistive device: Rolling walker Ambulation/Gait Assistance Details: visibly fatigued on return stating she was out of breath though did not appear in distress Gait Pattern: Step-through pattern;Decreased stride length;Trunk flexed Gait velocity: decr Stairs: No Wheelchair  Mobility Wheelchair Mobility: No    Shoulder Instructions     Exercises     PT Diagnosis: Generalized weakness;Other (comment) (decr activity tolerance)  PT Problem List: Decreased strength;Decreased activity tolerance;Decreased balance;Decreased knowledge of use of DME;Cardiopulmonary status limiting activity PT Treatment Interventions: DME instruction;Gait training;Functional mobility training;Therapeutic activities;Patient/family education   PT Goals Acute Rehab PT Goals PT Goal Formulation: With patient Time For Goal Achievement: 07/03/12 Potential to Achieve Goals: Good PT Goal: Supine/Side to Sit - Progress: Goal set today PT Goal: Sit to Supine/Side - Progress: Goal set today Pt will go Sit to Stand: with modified independence PT Goal: Sit to Stand - Progress: Goal set today Pt will go Stand to Sit: with modified independence PT Goal: Stand to Sit - Progress: Goal set today PT Goal: Ambulate - Progress: Goal set today Pt will Go Up / Down Stairs: 1-2 stairs;with supervision;with least restrictive assistive device (if to go directly home) PT Goal: Up/Down Stairs - Progress: Goal set today  Visit Information  Last PT Received On: 06/19/12 Assistance Needed: +1    Subjective Data  Subjective: Nope, I haven't made it home yet. Patient Stated Goal: Get home, get Independent   Prior Functioning  Home Living Lives With: Alone Available Help at Discharge: Family Type of Home: House Home Access: Stairs to enter Secretary/administrator of Steps: 2 Entrance Stairs-Rails: None Home Layout: One level Firefighter: Standard Home Adaptive Equipment: Walker - rolling Prior Function Level of Independence: Independent Able to Take Stairs?: Yes Driving: Yes Vocation: Retired Musician: No difficulties Dominant Hand: Right    Cognition  Overall Cognitive Status: Appears within functional limits for tasks assessed/performed Arousal/Alertness:  Awake/alert Orientation Level: Appears intact for tasks  assessed Behavior During Session: [facility] for tasks performed    Extremity/Trunk Assessment Right Lower Extremity Assessment RLE ROM/Strength/Tone: [facility] for tasks assessed;Deficits RLE ROM/Strength/Tone Deficits: grossly weak bilaterally at 4/5 Left Lower Extremity Assessment LLE ROM/Strength/Tone: [facility] for tasks assessed;Deficits LLE ROM/Strength/Tone Deficits: see RLE   Balance    End of Session PT - End of Session Activity Tolerance: Patient limited by fatigue Patient left: in bed;with call bell/phone within reach;with family/visitor present Nurse Communication: Mobility status  GP     Tishie Altmann, Eliseo Gum 06/19/2012, 4:46 PM   06/19/2012  [place] Bing, PT [phone] (pager)

## 2012-06-19 NOTE — Progress Notes (Signed)
ANTICOAGULATION CONSULT NOTE - Follow Up Consult  Pharmacy Consult for Coumadin Indication: atrial fibrillation, MVR  Allergies  Allergen Reactions  . Promethazine Hcl Other (See Comments)    unknown  . Sulfonamide Derivatives Other (See Comments)    unknown  . Tequin Other (See Comments)    hallucinations     Patient Measurements: Height: 5' (152.4 cm) Weight: 99 lb 6.4 oz (45.088 kg) IBW/kg (Calculated) : 45.5  Heparin Dosing Weight:   Vital Signs: Temp: 97.5 F (36.4 C) (11/16 1417) Temp src: Oral (11/16 1417) BP: 108/56 mmHg (11/16 1417) Pulse Rate: 91  (11/16 1417)  Labs:  Basename 06/19/12 0511 06/18/12 0430 06/17/12 2016 06/17/12 1536 06/17/12 0500  HGB -- 10.2* -- -- 10.2*  HCT -- 33.8* -- -- 32.3*  PLT -- 207 -- -- 228  APTT -- -- -- -- --  LABPROT 24.6* 22.4* -- -- 24.2*  INR 2.34* 2.06* -- -- 2.29*  HEPARINUNFRC -- -- -- -- --  CREATININE 0.82 0.78 -- -- 0.80  CKTOTAL -- -- -- -- --  CKMB -- -- -- -- --  TROPONINI -- -- <0.30 <0.30 --    Estimated Creatinine Clearance: 37 ml/min (by C-G formula based on Cr of 0.82).   Medications:  Scheduled:    . [COMPLETED] albuterol  2.5 mg Nebulization Once  . aspirin EC  81 mg Oral Daily  . calcium-vitamin D  1 tablet Oral BID  . docusate sodium  100 mg Oral BID  . feeding supplement  1 Container Oral TID BM  . folic acid  2 mg Oral Daily  . furosemide  40 mg Intravenous BID  . LORazepam  0.5 mg Intravenous Once  . megestrol  400 mg Oral Daily  . metoprolol  75 mg Oral BID  . potassium chloride  40 mEq Oral Daily  . predniSONE  2.5 mg Oral QODAY  . roflumilast  500 mcg Oral Daily  . sodium chloride  3 mL Intravenous Q12H  . sodium chloride  3 mL Intravenous Q12H  . [COMPLETED] warfarin  2 mg Oral ONCE-1800  . Warfarin - Pharmacist Dosing Inpatient   Does not apply q1800  . [DISCONTINUED] furosemide  40 mg Intravenous Q8H    Assessment: 78yo female with AFib, now in SR.  INR therapeutic at 2.34  today.  No bleeding problems noted.  Pt was supratherapeutic on admission on home dose of 3mg  daily x 1 mg Fridays; pt has not been eating well.  Goal of Therapy:  INR 2-3 Monitor platelets by anticoagulation protocol: Yes   Plan:  1.  Coumadin 1mg  today 2.  F/U in AM  Marisue Humble, PharmD Clinical Pharmacist [facility]

## 2012-06-19 NOTE — Progress Notes (Signed)
[facility] PROGRESS NOTE  Nancy Bush ZOX:[id number] DOB: 04/11/1926 DOA: 06/15/2012 PCP: [name], MD  Assessment/Plan: Acute on chronic diastolic CHF  -Ejection fraction 50-55%  -continue furosemide to 40 mg IV every 8  -neg 5290cc/ -4.7 kg for the admission  -LE edema improving -CXR in am Atrial fibrillation s/p MAZE procedure  -Rate controlled  -Continue warfarin  -Continue metoprolol  -Consider switching metoprolol to carvedilol  -Possible cardioversion per cardiology  Dyspnea  -Likely multifactorial due to the patient's decompensated CHF, deconditioning, COPD, atrial fibrillation, anemia -PRN xopenex -OOB, PT eval -recheck CBC in am Tricuspid regurgitation  -Echocardiogram on 06/16/2012 revealed worsening tricuspid regurgitation compared to TEE on 02/09/2012  -Appreciate cardiology recommendations  Status post mitral valve replacement  -bioprosthetic valve  COPD  -Continue roflumilast, low dose prednisone  Chest pain  -Resolved  -troponins-negative  -Chronic left bundle-branch block on EKG  Normocytic anemia  -Likely anemia of chronic disease  -Hemoglobin has remained stable    Disposition Plan:   SNF when medically stable        Procedures/Studies: Dg Chest 2 View  06/17/2012  *RADIOLOGY REPORT*  Clinical Data: Follow up effusions.  CHF.  CHEST - 2 VIEW  Comparison: 06/15/2012  Findings: Left chest wall pacer device is identified with leads in the right atrial appendage and right ventricle.  Heart size is normal.  Similar appearance of moderate bilateral pleural effusions with overlying atelectasis.  No interstitial edema.  No airspace consolidation.  IMPRESSION:  1.  No change in bilateral pleural effusions.   Original Report Authenticated By: [name], M.D.    Dg Chest 2 View  06/15/2012  *RADIOLOGY REPORT*  Clinical Data: Left chest pain, weakness  CHEST - 2 VIEW  Comparison: Chest x-ray of 06/14/2012  Findings: There has  been some increase in opacities at the lung bases most consistent with slight increase in effusions and basilar atelectasis.  Mild cardiomegaly is stable.  A dual lead permanent pacemaker remains.  The bones are osteopenic.  IMPRESSION: Some increase in volume of small effusions and basilar atelectasis bilaterally.   Original Report Authenticated By: [name], M.D.    Dg Chest 2 View  06/14/2012  *RADIOLOGY REPORT*  Clinical Data: Post MVR October 2013, shortness of breath  CHEST - 2 VIEW  Comparison: 05/27/2012  Findings: Left subclavian sequential transvenous pacemaker leads project at right atrium and right ventricle. Enlargement of cardiac silhouette post MVR. Mediastinal contours normal. Minimal pulmonary vascular congestion. Bibasilar effusions and atelectasis, increased on left. No pulmonary infiltrate or pneumothorax. Surgical clips right upper quadrant question cholecystectomy. Additional surgical clips right axilla. Bones appear demineralized. Question tiny left renal calculus.  IMPRESSION: Bibasilar effusions atelectasis, increased on left since previous study. Mild enlargement of cardiac silhouette minimal pulmonary vascular congestion post MVR and pacemaker.   Original Report Authenticated By: [name], M.D.    Dg Chest 2 View  05/27/2012  *RADIOLOGY REPORT*  Clinical Data: Weakness.  Pleural effusion.  CHEST - 2 VIEW  Comparison: PA and lateral chest 05/24/2012.  Findings: Small bilateral pleural effusions and basilar atelectasis persist.  The patient's right pleural effusion appears decreased in size. Small right apical pneumothorax, less than 5%, has decreased in size.  Associated basilar atelectasis is noted.  There is cardiomegaly without edema.  IMPRESSION:  1.  Some decrease in a small right pleural effusion.  Tiny right apical pneumothorax is also decreased in size. 2.  No change in a very small left pleural effusion.   Original  Report Authenticated By: [name]. [name], M.D.     Dg Chest 2 View  05/24/2012  *RADIOLOGY REPORT*  Clinical Data: Pacemaker placement  CHEST - 2 VIEW  Comparison: 05/23/2012  Findings: Stable asymmetric elevation of the right hemidiaphragm. Tiny bilateral pleural effusions persist.  Emphysema noted.  Tiny right apical pneumothorax is not substantially changed in the interval.  Left dual lead permanent pacemaker again noted. The cardiopericardial silhouette is enlarged.  IMPRESSION: Emphysema with tiny right apical pneumothorax, unchanged.  Tiny bilateral pleural effusions.   Original Report Authenticated By: [name], M.D.    Ct Head Wo Contrast  05/26/2012  *RADIOLOGY REPORT*  Clinical Data: Change in mental status.   Pt was ambulatory and talking yesterday. Not responding today.  CT HEAD WITHOUT CONTRAST  Technique:  Contiguous axial images were obtained from the base of the skull through the vertex without contrast.  Comparison: None.  Findings: The brain stem, cerebellum, cerebral peduncles, thalami, basal ganglia, basilar cisterns, and ventricular system appear unremarkable.  Minimal periventricular white matter hypodensity suggest mild chronic ischemic microvascular white matter disease.  No intracranial hemorrhage, mass lesion, or acute infarction is identified.  Incidental failure of fusion of the posterior arch of C1 noted.  IMPRESSION:  1.  No acute intracranial findings. 2.  Mild chronic ischemic microvascular white matter disease.   Original Report Authenticated By: [name], M.D.    Dg Chest Bilateral Decubitus  06/17/2012  *RADIOLOGY REPORT*  Clinical Data: Bilateral pleural effusions.  CHEST - BILATERAL DECUBITUS VIEW 6:05 p.m.  Comparison: 06/17/2012 at 5:56 p.m.  Findings: The left pleural effusion layers laterally in the decubitus position with a small amount loculated at the base.  There is only a tiny right effusion with a tiny amount of layering laterally at the base but not extending superiorly.  There is also  chronic blunting of the costophrenic angles bilaterally.  IMPRESSION: Partial layering of the bilateral pleural effusions laterally.   Original Report Authenticated By: Francene Boyers, M.D.    Dg Chest Port 1 View  05/23/2012  *RADIOLOGY REPORT*  Clinical Data: Post mitral valve replacement  PORTABLE CHEST - 1 VIEW  Comparison: 05/22/2012; 05/21/2012; 05/20/2012  Findings: Unchanged cardiac silhouette and mediastinal contours post mitral valve replacement.  Interval removal of right-sided chest tubes with unchanged tiny right apical pneumothorax. Unchanged small bilateral effusions, right greater than left. There is persistent mild elevation right hemidiaphragm.  Unchanged bibasilar heterogeneous opacities.  No new focal airspace opacity. No definite evidence of pulmonary edema.  Unchanged bones.  Post cholecystectomy.  IMPRESSION: 1.  Interval removal of right-sided chest tubes with unchanged tiny right apical pneumothorax. 2.  Unchanged small bilateral effusions and bibasilar opacities, right greater than left. 3.  No definite evidence of pulmonary edema.   Original Report Authenticated By: Waynard Reeds, M.D.    Dg Chest Portable 1 View  05/22/2012  *RADIOLOGY REPORT*  Clinical Data: Post pacemaker insertion  PORTABLE CHEST - 1 VIEW  Comparison: 05/21/2012; 05/20/2012  Findings:  Grossly unchanged cardiac silhouette and mediastinal contours post mitral valve repair.  Interval removal of left jugular approach central venous catheter.  Interval placement of left anterior chest wall dual lead pacemaker with tips overlying expected location of the right atrium and ventricle.  Two right-sided chest tubes are unchanged in positioning.  Note is made of a tiny right apical pneumothorax.  There is persistent mild elevation of the right hemidiaphragm.  Lung volumes appear hyperexpanded.  Minimal improved aeration of  the bilateral lung bases with persistent bibasilar opacities favored to represent atelectasis  versus scar. Unchanged bones.  Post cholecystectomy.  IMPRESSION: 1.  Interval placement of left anterior chest wall dual lead pacemaker with tips overlying the expected location of the right atrium and ventricle. 2.  Stable positioning of two right-sided chest tubes, now with tiny right apical pneumothorax. 3.  Overall improved aeration of the lungs with persistent bibasilar opacities favored to represent atelectasis.   Original Report Authenticated By: Waynard Reeds, M.D.    Dg Chest Port 1 View  05/21/2012  *RADIOLOGY REPORT*  Clinical Data: Chest tube  PORTABLE CHEST - 1 VIEW  Comparison: 05/20/2012; 05/19/2012; 05/18/2012  Findings:  Grossly unchanged cardiac silhouette and mediastinal contours post aortic valve repair.  Interval removal of a left jugular approach central venous catheter.  A remaining left jugular approach central venous catheter tip overlies the superior cavoatrial junction. Stable positioning of two right-sided chest tubes.  No definite pneumothorax.  Unchanged small bilateral effusions and bibasilar opacities, right greater than left. No new focal airspace opacities.  No definite evidence of edema.  Unchanged bones.  Post cholecystectomy.  IMPRESSION: 1.  Interval removal of a left jugular approach central venous catheter.  Otherwise, stable position of support apparatus.  No pneumothorax. 2.  Unchanged small bilateral effusions and bibasilar opacities, right greater than left, possibly atelectasis   Original Report Authenticated By: Waynard Reeds, M.D.          Subjective: Patient continues to complain of shortness of breath. She states that this is unchanged from yesterday,, but better than at the time of admission. Denies any fevers, chills, chest pain, dizziness, nausea, vomiting, abdominal pain, rashes.  Objective: Filed Vitals:   06/18/12 1033 06/18/12 1217 06/18/12 2006 06/19/12 0501  BP: 111/69 116/58 122/59 130/64  Pulse: 71 68 70 95  Temp:  97.7 F (36.5 C)  97.7 F (36.5 C) 98.2 F (36.8 C)  TempSrc:  Oral Oral Oral  Resp:   16 16  Height:      Weight:    45.088 kg (99 lb 6.4 oz)  SpO2: 100% 100% 99% 100%    Intake/Output Summary (Last 24 hours) at 06/19/12 1028 Last data filed at 06/19/12 0700  Gross per 24 hour  Intake    364 ml  Output   1200 ml  Net   -836 ml   Weight change: -0.953 kg (-2 lb 1.6 oz) Exam:   General:  Pt is alert, follows commands appropriately, not in acute distress  HEENT: No icterus, No thrush, [place]/AT  Cardiovascular: RRR, S1/S2, no rubs,   Respiratory: Bibasilar crackles, left greater than right. Air movement. No wheezes.  Abdomen: Soft/+BS, non tender, non distended, no guarding  Extremities: 2+ edema, No lymphangitis, No petechiae, No rashes, no synovitis  Data Reviewed: Basic Metabolic Panel:  Lab 06/19/12 5284 06/18/12 0430 06/17/12 0500 06/15/12 1708 06/14/12 1400  NA 138 135 137 130* 131*  K 3.8 3.9 3.6 4.6 4.7  CL 93* 94* 95* 92* 96  CO2 40* 35* 35* 30 30  GLUCOSE 138* 113* 119* 137* 105*  BUN 10 8 9 10 8   CREATININE 0.82 0.78 0.80 0.97 0.80  CALCIUM 9.5 8.8 9.0 9.2 9.2  MG -- -- -- -- --  PHOS -- -- -- -- --   Liver Function Tests:  Lab 06/15/12 1708 06/14/12 1400  AST 19 19  ALT 11 12  ALKPHOS 99 77  BILITOT 0.4 0.5  PROT  6.2 5.7*  ALBUMIN 3.1* 3.2*   No results found for this basename: LIPASE:5,AMYLASE:5 in the last 168 hours No results found for this basename: AMMONIA:5 in the last 168 hours CBC:  Lab 06/18/12 0430 06/17/12 0500 06/15/12 1708 06/14/12 1400  WBC 10.9* 11.4* 9.4 8.9  NEUTROABS -- -- -- --  HGB 10.2* 10.2* 10.6* 10.5*  HCT 33.8* 32.3* 33.7* 30.8*  MCV 92.3 92.6 90.3 86.8  PLT 207 228 237 245   Cardiac Enzymes:  Lab 06/17/12 2016 06/17/12 1536 06/15/12 1714  CKTOTAL -- -- --  CKMB -- -- --  CKMBINDEX -- -- --  TROPONINI <0.30 <0.30 <0.30   BNP: No components found with this basename: POCBNP:5 CBG: No results found for this basename: GLUCAP:5  in the last 168 hours  Recent Results (from the past 240 hour(s))  MRSA PCR SCREENING     Status: Normal   Collection Time   06/15/12 10:29 PM      Component Value Range Status Comment   MRSA by PCR NEGATIVE  NEGATIVE Final      Scheduled Meds:   . albuterol  2.5 mg Nebulization Once  . aspirin EC  81 mg Oral Daily  . calcium-vitamin D  1 tablet Oral BID  . docusate sodium  100 mg Oral BID  . feeding supplement  1 Container Oral TID BM  . folic acid  2 mg Oral Daily  . furosemide  40 mg Intravenous Q8H  . LORazepam  0.5 mg Intravenous Once  . metoprolol  75 mg Oral BID  . potassium chloride  40 mEq Oral Daily  . predniSONE  2.5 mg Oral QODAY  . roflumilast  500 mcg Oral Daily  . sodium chloride  3 mL Intravenous Q12H  . sodium chloride  3 mL Intravenous Q12H  . [COMPLETED] warfarin  2 mg Oral ONCE-1800  . Warfarin - Pharmacist Dosing Inpatient   Does not apply q1800   Continuous Infusions:   . sodium chloride 10 mL/hr at 06/17/12 0530     Aarin Bluett, DO  [facility] Pager [phone]  If 7PM-7AM, please contact night-coverage [email or web address] Password [email or web address] 06/19/2012, 10:28 AM   LOS: 4 days

## 2012-06-20 LAB — CBC
MCV: 92.4 fL (ref 78.0–100.0)
Platelets: 192 10*3/uL (ref 150–400)
RDW: 16.5 % — ABNORMAL HIGH (ref 11.5–15.5)
WBC: 9.3 10*3/uL (ref 4.0–10.5)

## 2012-06-20 LAB — BASIC METABOLIC PANEL
CO2: 40 mEq/L (ref 19–32)
Calcium: 9 mg/dL (ref 8.4–10.5)
Creatinine, Ser: 0.78 mg/dL (ref 0.50–1.10)
GFR calc Af Amer: 87 mL/min — ABNORMAL LOW (ref >90)
GFR calc non Af Amer: 75 mL/min — ABNORMAL LOW (ref >90)

## 2012-06-20 LAB — PREALBUMIN: Prealbumin: 6.5 mg/dL — ABNORMAL LOW (ref 17.0–34.0)

## 2012-06-20 LAB — PROTIME-INR
INR: 2.68 — ABNORMAL HIGH (ref 0.00–1.49)
Prothrombin Time: 27.2 seconds — ABNORMAL HIGH (ref 11.6–15.2)

## 2012-06-20 MED ORDER — WARFARIN SODIUM 1 MG PO TABS
1.0000 mg | ORAL_TABLET | Freq: Once | ORAL | Status: AC
  Administered 2012-06-20: 1 mg via ORAL
  Filled 2012-06-20: qty 1

## 2012-06-20 MED ORDER — ENSURE COMPLETE PO LIQD
237.0000 mL | Freq: Two times a day (BID) | ORAL | Status: DC
  Administered 2012-06-20 – 2012-06-21 (×4): 237 mL via ORAL

## 2012-06-20 MED ORDER — WHITE PETROLATUM GEL
Status: DC | PRN
  Filled 2012-06-20: qty 28.35
  Filled 2012-06-20: qty 5

## 2012-06-20 NOTE — Progress Notes (Signed)
CRITICAL VALUE ALERT  Critical value received:  CO2 40  Date of notification:  06/20/2012  Time of notification:  0754  Critical value read back:yes  Nurse who received alert:  Jeremy Johann, RN  MD notified (1st page):  Tat, MD   Time of first page:  0800  MD notified (2nd page):  Time of second page:  Responding MD:  Tat, MD  Time MD responded:  [phone]

## 2012-06-20 NOTE — Progress Notes (Signed)
Patient ID: AUNYA LEMLER, female   DOB: 1926-07-01, 78 y.o.   MRN: [id number]   Patient Name: Nancy Bush Date of Encounter: 06/20/2012    SUBJECTIVE  Sitting up in chair actually looks better. Despite Megace her appetite is still poor. Says her nose is very drop the oxygen. Edema is gone.  Not sure I.'s and O.'s are complete. I did back off somewhat on her Lasix regimen yesterday. Electrolytes and renal function are stable.  CURRENT MEDS    . [COMPLETED] albuterol  2.5 mg Nebulization Once  . aspirin EC  81 mg Oral Daily  . calcium-vitamin D  1 tablet Oral BID  . docusate sodium  100 mg Oral BID  . feeding supplement  1 Container Oral TID BM  . folic acid  2 mg Oral Daily  . furosemide  40 mg Intravenous BID  . LORazepam  0.5 mg Intravenous Once  . megestrol  400 mg Oral Daily  . metoprolol  75 mg Oral BID  . potassium chloride  40 mEq Oral Daily  . predniSONE  2.5 mg Oral QODAY  . roflumilast  500 mcg Oral Daily  . sodium chloride  3 mL Intravenous Q12H  . sodium chloride  3 mL Intravenous Q12H  . [COMPLETED] warfarin  1 mg Oral ONCE-1800  . Warfarin - Pharmacist Dosing Inpatient   Does not apply q1800  . [DISCONTINUED] furosemide  40 mg Intravenous Q8H    OBJECTIVE  Filed Vitals:   06/19/12 1417 06/19/12 1941 06/19/12 2138 06/20/12 0442  BP: 108/56 100/66 105/66 122/58  Pulse: 91 84 105 74  Temp: 97.5 F (36.4 C) 98.2 F (36.8 C)  98.1 F (36.7 C)  TempSrc: Oral Oral  Oral  Resp: 16 18  16   Height:      Weight:    97 lb 12.8 oz (44.362 kg)  SpO2: 100% 99%  100%    Intake/Output Summary (Last 24 hours) at 06/20/12 1043 Last data filed at 06/20/12 0700  Gross per 24 hour  Intake    240 ml  Output    101 ml  Net    139 ml   Filed Weights   06/18/12 0428 06/19/12 0501 06/20/12 0442  Weight: 101 lb 8 oz (46.04 kg) 99 lb 6.4 oz (45.088 kg) 97 lb 12.8 oz (44.362 kg)    PHYSICAL EXAM  General: Pleasant, NAD. Frail Neuro: Alert and oriented X  3. Moves all extremities spontaneously. Psych: Normal affect. HEENT:  Normal  Neck: Supple without bruits or JVD. Lungs:  Resp regular and unlabored, decreased breath sounds in both bases but no crackles. Heart: RRR no s3, s4, or murmurs. Abdomen: Soft, non-tender, non-distended, BS + x 4.  Extremities: No clubbing, cyanosis or edema. DP/PT/Radials 2+ and equal bilaterally.  Accessory Clinical Findings  CBC  Basename 06/20/12 0650 06/18/12 0430  WBC 9.3 10.9*  NEUTROABS -- --  HGB 10.3* 10.2*  HCT 34.0* 33.8*  MCV 92.4 92.3  PLT 192 207   Basic Metabolic Panel  Basename 06/20/12 0650 06/19/12 0511  NA 138 138  K 3.5 3.8  CL 93* 93*  CO2 40* 40*  GLUCOSE 112* 138*  BUN 10 10  CREATININE 0.78 0.82  CALCIUM 9.0 9.5  MG -- --  PHOS -- --   Liver Function Tests No results found for this basename: AST:2,ALT:2,ALKPHOS:2,BILITOT:2,PROT:2,ALBUMIN:2 in the last 72 hours No results found for this basename: LIPASE:2,AMYLASE:2 in the last 72 hours Cardiac Enzymes  Basename 06/17/12 2016 06/17/12  1536  CKTOTAL -- --  CKMB -- --  CKMBINDEX -- --  TROPONINI <0.30 <0.30   BNP No components found with this basename: POCBNP:3 D-Dimer No results found for this basename: DDIMER:2 in the last 72 hours Hemoglobin A1C No results found for this basename: HGBA1C in the last 72 hours Fasting Lipid Panel No results found for this basename: CHOL,HDL,LDLCALC,TRIG,CHOLHDL,LDLDIRECT in the last 72 hours Thyroid Function Tests No results found for this basename: TSH,T4TOTAL,FREET3,T3FREE,THYROIDAB in the last 72 hours  TELE Sinus rhythm with the V pacing  ECG   Radiology/Studies  Dg Chest 2 View  06/17/2012  *RADIOLOGY REPORT*  Clinical Data: Follow up effusions.  CHF.  CHEST - 2 VIEW  Comparison: 06/15/2012  Findings: Left chest Amitai Delaughter pacer device is identified with leads in the right atrial appendage and right ventricle.  Heart size is normal.  Similar appearance of moderate  bilateral pleural effusions with overlying atelectasis.  No interstitial edema.  No airspace consolidation.  IMPRESSION:  1.  No change in bilateral pleural effusions.   Original Report Authenticated By: [name], M.D.    Dg Chest 2 View  06/15/2012  *RADIOLOGY REPORT*  Clinical Data: Left chest pain, weakness  CHEST - 2 VIEW  Comparison: Chest x-ray of 06/14/2012  Findings: There has been some increase in opacities at the lung bases most consistent with slight increase in effusions and basilar atelectasis.  Mild cardiomegaly is stable.  A dual lead permanent pacemaker remains.  The bones are osteopenic.  IMPRESSION: Some increase in volume of small effusions and basilar atelectasis bilaterally.   Original Report Authenticated By: [name], M.D.    Dg Chest 2 View  06/14/2012  *RADIOLOGY REPORT*  Clinical Data: Post MVR October 2013, shortness of breath  CHEST - 2 VIEW  Comparison: 05/27/2012  Findings: Left subclavian sequential transvenous pacemaker leads project at right atrium and right ventricle. Enlargement of cardiac silhouette post MVR. Mediastinal contours normal. Minimal pulmonary vascular congestion. Bibasilar effusions and atelectasis, increased on left. No pulmonary infiltrate or pneumothorax. Surgical clips right upper quadrant question cholecystectomy. Additional surgical clips right axilla. Bones appear demineralized. Question tiny left renal calculus.  IMPRESSION: Bibasilar effusions atelectasis, increased on left since previous study. Mild enlargement of cardiac silhouette minimal pulmonary vascular congestion post MVR and pacemaker.   Original Report Authenticated By: [name], M.D.    Dg Chest 2 View  05/27/2012  *RADIOLOGY REPORT*  Clinical Data: Weakness.  Pleural effusion.  CHEST - 2 VIEW  Comparison: PA and lateral chest 05/24/2012.  Findings: Small bilateral pleural effusions and basilar atelectasis persist.  The patient's right pleural effusion appears decreased in  size. Small right apical pneumothorax, less than 5%, has decreased in size.  Associated basilar atelectasis is noted.  There is cardiomegaly without edema.  IMPRESSION:  1.  Some decrease in a small right pleural effusion.  Tiny right apical pneumothorax is also decreased in size. 2.  No change in a very small left pleural effusion.   Original Report Authenticated By: [name]. [name], M.D.    Dg Chest 2 View  05/24/2012  *RADIOLOGY REPORT*  Clinical Data: Pacemaker placement  CHEST - 2 VIEW  Comparison: 05/23/2012  Findings: Stable asymmetric elevation of the right hemidiaphragm. Tiny bilateral pleural effusions persist.  Emphysema noted.  Tiny right apical pneumothorax is not substantially changed in the interval.  Left dual lead permanent pacemaker again noted. The cardiopericardial silhouette is enlarged.  IMPRESSION: Emphysema with tiny right apical pneumothorax, unchanged.  Tiny bilateral  pleural effusions.   Original Report Authenticated By: [name], M.D.    Ct Head Wo Contrast  05/26/2012  *RADIOLOGY REPORT*  Clinical Data: Change in mental status.   Pt was ambulatory and talking yesterday. Not responding today.  CT HEAD WITHOUT CONTRAST  Technique:  Contiguous axial images were obtained from the base of the skull through the vertex without contrast.  Comparison: None.  Findings: The brain stem, cerebellum, cerebral peduncles, thalami, basal ganglia, basilar cisterns, and ventricular system appear unremarkable.  Minimal periventricular white matter hypodensity suggest mild chronic ischemic microvascular white matter disease.  No intracranial hemorrhage, mass lesion, or acute infarction is identified.  Incidental failure of fusion of the posterior arch of C1 noted.  IMPRESSION:  1.  No acute intracranial findings. 2.  Mild chronic ischemic microvascular white matter disease.   Original Report Authenticated By: [name], M.D.    Dg Chest Bilateral Decubitus  06/17/2012   *RADIOLOGY REPORT*  Clinical Data: Bilateral pleural effusions.  CHEST - BILATERAL DECUBITUS VIEW 6:05 p.m.  Comparison: 06/17/2012 at 5:56 p.m.  Findings: The left pleural effusion layers laterally in the decubitus position with a small amount loculated at the base.  There is only a tiny right effusion with a tiny amount of layering laterally at the base but not extending superiorly.  There is also chronic blunting of the costophrenic angles bilaterally.  IMPRESSION: Partial layering of the bilateral pleural effusions laterally.   Original Report Authenticated By: Francene Boyers, M.D.    Dg Chest Port 1 View  05/23/2012  *RADIOLOGY REPORT*  Clinical Data: Post mitral valve replacement  PORTABLE CHEST - 1 VIEW  Comparison: 05/22/2012; 05/21/2012; 05/20/2012  Findings: Unchanged cardiac silhouette and mediastinal contours post mitral valve replacement.  Interval removal of right-sided chest tubes with unchanged tiny right apical pneumothorax. Unchanged small bilateral effusions, right greater than left. There is persistent mild elevation right hemidiaphragm.  Unchanged bibasilar heterogeneous opacities.  No new focal airspace opacity. No definite evidence of pulmonary edema.  Unchanged bones.  Post cholecystectomy.  IMPRESSION: 1.  Interval removal of right-sided chest tubes with unchanged tiny right apical pneumothorax. 2.  Unchanged small bilateral effusions and bibasilar opacities, right greater than left. 3.  No definite evidence of pulmonary edema.   Original Report Authenticated By: Waynard Reeds, M.D.    Dg Chest Portable 1 View  05/22/2012  *RADIOLOGY REPORT*  Clinical Data: Post pacemaker insertion  PORTABLE CHEST - 1 VIEW  Comparison: 05/21/2012; 05/20/2012  Findings:  Grossly unchanged cardiac silhouette and mediastinal contours post mitral valve repair.  Interval removal of left jugular approach central venous catheter.  Interval placement of left anterior chest Preslee Regas dual lead pacemaker with tips  overlying expected location of the right atrium and ventricle.  Two right-sided chest tubes are unchanged in positioning.  Note is made of a tiny right apical pneumothorax.  There is persistent mild elevation of the right hemidiaphragm.  Lung volumes appear hyperexpanded.  Minimal improved aeration of the bilateral lung bases with persistent bibasilar opacities favored to represent atelectasis versus scar. Unchanged bones.  Post cholecystectomy.  IMPRESSION: 1.  Interval placement of left anterior chest Gwendolyn Nishi dual lead pacemaker with tips overlying the expected location of the right atrium and ventricle. 2.  Stable positioning of two right-sided chest tubes, now with tiny right apical pneumothorax. 3.  Overall improved aeration of the lungs with persistent bibasilar opacities favored to represent atelectasis.   Original Report Authenticated By: Waynard Reeds,  M.D.     ASSESSMENT AND PLAN  Active Problems:  COPD  Shortness of breath  Atrial fibrillation  Encounter for long-term (current) use of anticoagulants  Acute on chronic diastolic heart failure  S/P mitral valve replacement with bioprosthetic valve  S/P Maze operation for atrial fibrillation  Pacemaker-[employer]  S/P MVR (mitral valve replacement)    We'll order some Vaseline for her nose. Continue with 40 mg of IV Lasix twice a day. Continue daily weights. Today is the only one I think recorded. Push nutrition with Ensure or boost shakes. Doing very little with pudding. Check metabolic profile in morning. Need to start increasing activity.  Valera Castle MD

## 2012-06-20 NOTE — Progress Notes (Signed)
[facility] PROGRESS NOTE  Nancy Bush ZOX:[id number] DOB: 1925/08/14 DOA: 06/15/2012 PCP: [name], MD  Assessment/Plan: Acute on chronic diastolic CHF  -Ejection fraction 50-55%  -continue furosemide to 40 mg IV every 12 -I/Os not accurate in past 24 hrs but lost 0.7kg  -neg 5151cc/ -5.4 kg for the admission  -LE edema improving   Atrial fibrillation s/p MAZE procedure  -Rate controlled, sinus -Continue warfarin  -Continue metoprolol  -Consider switching metoprolol to carvedilol  Dyspnea  -Likely multifactorial due to the patient's decompensated CHF, deconditioning, COPD, atrial fibrillation, anemia  -PRN xopenex  -OOB, PT eval  -hemoglobin stable Tricuspid regurgitation  -Echocardiogram on 06/16/2012 revealed worsening tricuspid regurgitation compared to TEE on 02/09/2012  -Appreciate cardiology recommendations  Failure to thrive -Pre-albumin is still pending -Megace added yesterday (11/16) -continue Ensure Status post mitral valve replacement  -bioprosthetic valve  COPD  -Continue roflumilast, low dose prednisone  Chest pain  -Resolved  -troponins-negative  -Chronic left bundle-branch block on EKG  Normocytic anemia  -Likely anemia of chronic disease  -Hemoglobin has remained stable    Disposition Plan: Back to SNF this week         Procedures/Studies: Dg Chest 2 View  06/17/2012  *RADIOLOGY REPORT*  Clinical Data: Follow up effusions.  CHF.  CHEST - 2 VIEW  Comparison: 06/15/2012  Findings: Left chest wall pacer device is identified with leads in the right atrial appendage and right ventricle.  Heart size is normal.  Similar appearance of moderate bilateral pleural effusions with overlying atelectasis.  No interstitial edema.  No airspace consolidation.  IMPRESSION:  1.  No change in bilateral pleural effusions.   Original Report Authenticated By: [name], M.D.    Dg Chest 2 View  06/15/2012  *RADIOLOGY REPORT*  Clinical  Data: Left chest pain, weakness  CHEST - 2 VIEW  Comparison: Chest x-ray of 06/14/2012  Findings: There has been some increase in opacities at the lung bases most consistent with slight increase in effusions and basilar atelectasis.  Mild cardiomegaly is stable.  A dual lead permanent pacemaker remains.  The bones are osteopenic.  IMPRESSION: Some increase in volume of small effusions and basilar atelectasis bilaterally.   Original Report Authenticated By: [name], M.D.    Dg Chest 2 View  06/14/2012  *RADIOLOGY REPORT*  Clinical Data: Post MVR October 2013, shortness of breath  CHEST - 2 VIEW  Comparison: 05/27/2012  Findings: Left subclavian sequential transvenous pacemaker leads project at right atrium and right ventricle. Enlargement of cardiac silhouette post MVR. Mediastinal contours normal. Minimal pulmonary vascular congestion. Bibasilar effusions and atelectasis, increased on left. No pulmonary infiltrate or pneumothorax. Surgical clips right upper quadrant question cholecystectomy. Additional surgical clips right axilla. Bones appear demineralized. Question tiny left renal calculus.  IMPRESSION: Bibasilar effusions atelectasis, increased on left since previous study. Mild enlargement of cardiac silhouette minimal pulmonary vascular congestion post MVR and pacemaker.   Original Report Authenticated By: [name], M.D.    Dg Chest 2 View  05/27/2012  *RADIOLOGY REPORT*  Clinical Data: Weakness.  Pleural effusion.  CHEST - 2 VIEW  Comparison: PA and lateral chest 05/24/2012.  Findings: Small bilateral pleural effusions and basilar atelectasis persist.  The patient's right pleural effusion appears decreased in size. Small right apical pneumothorax, less than 5%, has decreased in size.  Associated basilar atelectasis is noted.  There is cardiomegaly without edema.  IMPRESSION:  1.  Some decrease in a small right pleural effusion.  Tiny right apical pneumothorax is  also decreased in size. 2.  No  change in a very small left pleural effusion.   Original Report Authenticated By: [name]. [name], M.D.    Dg Chest 2 View  05/24/2012  *RADIOLOGY REPORT*  Clinical Data: Pacemaker placement  CHEST - 2 VIEW  Comparison: 05/23/2012  Findings: Stable asymmetric elevation of the right hemidiaphragm. Tiny bilateral pleural effusions persist.  Emphysema noted.  Tiny right apical pneumothorax is not substantially changed in the interval.  Left dual lead permanent pacemaker again noted. The cardiopericardial silhouette is enlarged.  IMPRESSION: Emphysema with tiny right apical pneumothorax, unchanged.  Tiny bilateral pleural effusions.   Original Report Authenticated By: [name], M.D.    Ct Head Wo Contrast  05/26/2012  *RADIOLOGY REPORT*  Clinical Data: Change in mental status.   Pt was ambulatory and talking yesterday. Not responding today.  CT HEAD WITHOUT CONTRAST  Technique:  Contiguous axial images were obtained from the base of the skull through the vertex without contrast.  Comparison: None.  Findings: The brain stem, cerebellum, cerebral peduncles, thalami, basal ganglia, basilar cisterns, and ventricular system appear unremarkable.  Minimal periventricular white matter hypodensity suggest mild chronic ischemic microvascular white matter disease.  No intracranial hemorrhage, mass lesion, or acute infarction is identified.  Incidental failure of fusion of the posterior arch of C1 noted.  IMPRESSION:  1.  No acute intracranial findings. 2.  Mild chronic ischemic microvascular white matter disease.   Original Report Authenticated By: [name], M.D.    Dg Chest Bilateral Decubitus  06/17/2012  *RADIOLOGY REPORT*  Clinical Data: Bilateral pleural effusions.  CHEST - BILATERAL DECUBITUS VIEW 6:05 p.m.  Comparison: 06/17/2012 at 5:56 p.m.  Findings: The left pleural effusion layers laterally in the decubitus position with a small amount loculated at the base.  There is only a tiny  right effusion with a tiny amount of layering laterally at the base but not extending superiorly.  There is also chronic blunting of the costophrenic angles bilaterally.  IMPRESSION: Partial layering of the bilateral pleural effusions laterally.   Original Report Authenticated By: Francene Boyers, M.D.    Dg Chest Port 1 View  05/23/2012  *RADIOLOGY REPORT*  Clinical Data: Post mitral valve replacement  PORTABLE CHEST - 1 VIEW  Comparison: 05/22/2012; 05/21/2012; 05/20/2012  Findings: Unchanged cardiac silhouette and mediastinal contours post mitral valve replacement.  Interval removal of right-sided chest tubes with unchanged tiny right apical pneumothorax. Unchanged small bilateral effusions, right greater than left. There is persistent mild elevation right hemidiaphragm.  Unchanged bibasilar heterogeneous opacities.  No new focal airspace opacity. No definite evidence of pulmonary edema.  Unchanged bones.  Post cholecystectomy.  IMPRESSION: 1.  Interval removal of right-sided chest tubes with unchanged tiny right apical pneumothorax. 2.  Unchanged small bilateral effusions and bibasilar opacities, right greater than left. 3.  No definite evidence of pulmonary edema.   Original Report Authenticated By: Waynard Reeds, M.D.    Dg Chest Portable 1 View  05/22/2012  *RADIOLOGY REPORT*  Clinical Data: Post pacemaker insertion  PORTABLE CHEST - 1 VIEW  Comparison: 05/21/2012; 05/20/2012  Findings:  Grossly unchanged cardiac silhouette and mediastinal contours post mitral valve repair.  Interval removal of left jugular approach central venous catheter.  Interval placement of left anterior chest wall dual lead pacemaker with tips overlying expected location of the right atrium and ventricle.  Two right-sided chest tubes are unchanged in positioning.  Note is made of a tiny right apical pneumothorax.  There  is persistent mild elevation of the right hemidiaphragm.  Lung volumes appear hyperexpanded.  Minimal  improved aeration of the bilateral lung bases with persistent bibasilar opacities favored to represent atelectasis versus scar. Unchanged bones.  Post cholecystectomy.  IMPRESSION: 1.  Interval placement of left anterior chest wall dual lead pacemaker with tips overlying the expected location of the right atrium and ventricle. 2.  Stable positioning of two right-sided chest tubes, now with tiny right apical pneumothorax. 3.  Overall improved aeration of the lungs with persistent bibasilar opacities favored to represent atelectasis.   Original Report Authenticated By: Waynard Reeds, M.D.          Subjective: Patient continues to complain of shortness of breath which is not any worse. She states that it has improved since admission. Shortness of breath mostly with exertion. Denies any chest pain, nausea, vomiting, abdominal pain, dysuria, fevers, chills, coughing. She is eating a little bit better.   Objective: Filed Vitals:   06/19/12 1941 06/19/12 2138 06/20/12 0442 06/20/12 1100  BP: 100/66 105/66 122/58 119/62  Pulse: 84 105 74 106  Temp: 98.2 F (36.8 C)  98.1 F (36.7 C)   TempSrc: Oral  Oral   Resp: 18  16   Height:      Weight:   44.362 kg (97 lb 12.8 oz)   SpO2: 99%  100% 100%    Intake/Output Summary (Last 24 hours) at 06/20/12 1203 Last data filed at 06/20/12 0700  Gross per 24 hour  Intake    240 ml  Output    101 ml  Net    139 ml   Weight change: -0.726 kg (-1 lb 9.6 oz) Exam:   General:  Pt is alert, follows commands appropriately, not in acute distress  HEENT: No icterus, No thrush,  [place]/AT  Cardiovascular: RRR, no rubs, no gallops  Respiratory: Left basilar crackles. Right side clear to auscultation Good air movement. No wheezes or rhonchi.   Abdomen: Soft/+BS, non tender, non distended, no guarding  Extremities: 1+ edema, No lymphangitis, No petechiae, No rashes, no synovitis  Data Reviewed: Basic Metabolic Panel:  Lab 06/20/12 1610 06/19/12 0511  06/18/12 0430 06/17/12 0500 06/15/12 1708  NA 138 138 135 137 130*  K 3.5 3.8 3.9 3.6 4.6  CL 93* 93* 94* 95* 92*  CO2 40* 40* 35* 35* 30  GLUCOSE 112* 138* 113* 119* 137*  BUN 10 10 8 9 10   CREATININE 0.78 0.82 0.78 0.80 0.97  CALCIUM 9.0 9.5 8.8 9.0 9.2  MG -- -- -- -- --  PHOS -- -- -- -- --   Liver Function Tests:  Lab 06/15/12 1708 06/14/12 1400  AST 19 19  ALT 11 12  ALKPHOS 99 77  BILITOT 0.4 0.5  PROT 6.2 5.7*  ALBUMIN 3.1* 3.2*   No results found for this basename: LIPASE:5,AMYLASE:5 in the last 168 hours No results found for this basename: AMMONIA:5 in the last 168 hours CBC:  Lab 06/20/12 0650 06/18/12 0430 06/17/12 0500 06/15/12 1708 06/14/12 1400  WBC 9.3 10.9* 11.4* 9.4 8.9  NEUTROABS -- -- -- -- --  HGB 10.3* 10.2* 10.2* 10.6* 10.5*  HCT 34.0* 33.8* 32.3* 33.7* 30.8*  MCV 92.4 92.3 92.6 90.3 86.8  PLT 192 207 228 237 245   Cardiac Enzymes:  Lab 06/17/12 2016 06/17/12 1536 06/15/12 1714  CKTOTAL -- -- --  CKMB -- -- --  CKMBINDEX -- -- --  TROPONINI <0.30 <0.30 <0.30   BNP: No components  found with this basename: POCBNP:5 CBG: No results found for this basename: GLUCAP:5 in the last 168 hours  Recent Results (from the past 240 hour(s))  MRSA PCR SCREENING     Status: Normal   Collection Time   06/15/12 10:29 PM      Component Value Range Status Comment   MRSA by PCR NEGATIVE  NEGATIVE Final      Scheduled Meds:   . [COMPLETED] albuterol  2.5 mg Nebulization Once  . aspirin EC  81 mg Oral Daily  . calcium-vitamin D  1 tablet Oral BID  . docusate sodium  100 mg Oral BID  . feeding supplement  237 mL Oral BID BM  . folic acid  2 mg Oral Daily  . furosemide  40 mg Intravenous BID  . LORazepam  0.5 mg Intravenous Once  . megestrol  400 mg Oral Daily  . metoprolol  75 mg Oral BID  . potassium chloride  40 mEq Oral Daily  . predniSONE  2.5 mg Oral QODAY  . roflumilast  500 mcg Oral Daily  . sodium chloride  3 mL Intravenous Q12H  . sodium  chloride  3 mL Intravenous Q12H  . [COMPLETED] warfarin  1 mg Oral ONCE-1800  . warfarin  1 mg Oral ONCE-1800  . Warfarin - Pharmacist Dosing Inpatient   Does not apply q1800  . [DISCONTINUED] feeding supplement  1 Container Oral TID BM   Continuous Infusions:   . [DISCONTINUED] sodium chloride 10 mL/hr at 06/17/12 0530     Yurem Viner, DO  [facility] Pager [phone]  If 7PM-7AM, please contact night-coverage [email or web address] Password [email or web address] 06/20/2012, 12:03 PM   LOS: 5 days

## 2012-06-20 NOTE — Progress Notes (Signed)
ANTICOAGULATION CONSULT NOTE - Follow Up Consult  Pharmacy Consult for Coumadin Indication: atrial fibrillation  Allergies  Allergen Reactions  . Promethazine Hcl Other (See Comments)    unknown  . Sulfonamide Derivatives Other (See Comments)    unknown  . Tequin Other (See Comments)    hallucinations     Patient Measurements: Height: 5' (152.4 cm) Weight: 97 lb 12.8 oz (44.362 kg) IBW/kg (Calculated) : 45.5  Heparin Dosing Weight:   Vital Signs: Temp: 98.1 F (36.7 C) (11/17 0442) Temp src: Oral (11/17 0442) BP: 119/62 mmHg (11/17 1100) Pulse Rate: 106  (11/17 1100)  Labs:  Basename 06/20/12 0650 06/19/12 0511 06/18/12 0430 06/17/12 2016 06/17/12 1536  HGB 10.3* -- 10.2* -- --  HCT 34.0* -- 33.8* -- --  PLT 192 -- 207 -- --  APTT -- -- -- -- --  LABPROT 27.2* 24.6* 22.4* -- --  INR 2.68* 2.34* 2.06* -- --  HEPARINUNFRC -- -- -- -- --  CREATININE 0.78 0.82 0.78 -- --  CKTOTAL -- -- -- -- --  CKMB -- -- -- -- --  TROPONINI -- -- -- <0.30 <0.30    Estimated Creatinine Clearance: 37.3 ml/min (by C-G formula based on Cr of 0.78).   Medications:  Scheduled:    . [COMPLETED] albuterol  2.5 mg Nebulization Once  . aspirin EC  81 mg Oral Daily  . calcium-vitamin D  1 tablet Oral BID  . docusate sodium  100 mg Oral BID  . feeding supplement  237 mL Oral BID BM  . folic acid  2 mg Oral Daily  . furosemide  40 mg Intravenous BID  . LORazepam  0.5 mg Intravenous Once  . megestrol  400 mg Oral Daily  . metoprolol  75 mg Oral BID  . potassium chloride  40 mEq Oral Daily  . predniSONE  2.5 mg Oral QODAY  . roflumilast  500 mcg Oral Daily  . sodium chloride  3 mL Intravenous Q12H  . sodium chloride  3 mL Intravenous Q12H  . [COMPLETED] warfarin  1 mg Oral ONCE-1800  . Warfarin - Pharmacist Dosing Inpatient   Does not apply q1800  . [DISCONTINUED] feeding supplement  1 Container Oral TID BM    Assessment: 78yo female with AFib and bioprosthetic MVR.  INR 2.68  today- up a bit, reflecting 2mg  doses.  Hg is stable.  No bleeding problems noted.  Goal of Therapy:  INR 2-3 Monitor platelets by anticoagulation protocol: Yes   Plan:  1.  Repeat Coumadin 1mg  today 2.  F/U in AM  Marisue Humble, PharmD Clinical Pharmacist [facility]

## 2012-06-21 ENCOUNTER — Ambulatory Visit: Payer: Medicare Other | Admitting: Thoracic Surgery (Cardiothoracic Vascular Surgery)

## 2012-06-21 LAB — BASIC METABOLIC PANEL
BUN: 11 mg/dL (ref 6–23)
Chloride: 94 mEq/L — ABNORMAL LOW (ref 96–112)
Creatinine, Ser: 0.72 mg/dL (ref 0.50–1.10)
GFR calc Af Amer: 90 mL/min — ABNORMAL LOW (ref >90)

## 2012-06-21 LAB — PROTIME-INR: Prothrombin Time: 26.6 seconds — ABNORMAL HIGH (ref 11.6–15.2)

## 2012-06-21 MED ORDER — BENEPROTEIN PO POWD
1.0000 | Freq: Three times a day (TID) | ORAL | Status: DC
  Administered 2012-06-21 – 2012-06-22 (×3): 6 g via ORAL
  Filled 2012-06-21 (×2): qty 227

## 2012-06-21 MED ORDER — WARFARIN SODIUM 1 MG PO TABS
1.0000 mg | ORAL_TABLET | Freq: Once | ORAL | Status: AC
  Administered 2012-06-21: 1 mg via ORAL
  Filled 2012-06-21: qty 1

## 2012-06-21 NOTE — Progress Notes (Signed)
    SUBJECTIVE:  Very frail appearing but slightly better than last week.  She denies any acute complaints.    PHYSICAL EXAM Filed Vitals:   06/20/12 1500 06/20/12 2018 06/20/12 2135 06/21/12 0408  BP: 94/52 106/63 108/64 98/51  Pulse: 96 74 76 63  Temp: 97.7 F (36.5 C) 98.3 F (36.8 C)  98.4 F (36.9 C)  TempSrc: Oral Oral  Oral  Resp: 16 18  16   Height:      Weight:    97 lb 4.8 oz (44.135 kg)  SpO2: 100% 100%  100%   General:  No distress Lungs:  Decreased breath sounds Heart:  Irregular Abdomen:  Positive bowel sounds, no rebound no guarding Extremities:  No edema.  LABS:  Results for orders placed during the hospital encounter of 06/15/12 (from the past 24 hour(s))  CLOSTRIDIUM DIFFICILE BY PCR     Status: Normal   Collection Time   06/20/12  2:33 PM      Component Value Range   C difficile by pcr NEGATIVE  NEGATIVE  PROTIME-INR     Status: Abnormal   Collection Time   06/21/12  6:30 AM      Component Value Range   Prothrombin Time 26.6 (*) 11.6 - 15.2 seconds   INR 2.60 (*) 0.00 - 1.49  BASIC METABOLIC PANEL     Status: Abnormal   Collection Time   06/21/12  6:30 AM      Component Value Range   Sodium 137  135 - 145 mEq/L   Potassium 3.8  3.5 - 5.1 mEq/L   Chloride 94 (*) 96 - 112 mEq/L   CO2 38 (*) 19 - 32 mEq/L   Glucose, Bld 108 (*) 70 - 99 mg/dL   BUN 11  6 - 23 mg/dL   Creatinine, Ser 5.36  0.50 - 1.10 mg/dL   Calcium 9.2  8.4 - 64.4 mg/dL   GFR calc non Af Amer 77 (*) >90 mL/min   GFR calc Af Amer 90 (*) >90 mL/min    Intake/Output Summary (Last 24 hours) at 06/21/12 0835 Last data filed at 06/21/12 0656  Gross per 24 hour  Intake    120 ml  Output    300 ml  Net   -180 ml    ASSESSMENT AND PLAN:  CHF:  Acute on chronic diastolic CHF.  Continue current diuretic IV today.  I will order a pro BNP to follow the delta.  Another chest Xray in the next day or so would be helpful to follow effusions.   Atrial flutter:  Rate OK.  I will  consider TEE/DCCV before discharge.     S/p MV replacement:  Bioprosthetic, stable on echo this admission.   Tricuspid regurgitation:  Continue diuresis and we will follow this.   Rollene Rotunda 06/21/2012 8:35 AM

## 2012-06-21 NOTE — Progress Notes (Signed)
    SUBJECTIVE:  Very frail appearing.  She still reports that she is short of breath   PHYSICAL EXAM Filed Vitals:   06/20/12 1500 06/20/12 2018 06/20/12 2135 06/21/12 0408  BP: 94/52 106/63 108/64 98/51  Pulse: 96 74 76 63  Temp: 97.7 F (36.5 C) 98.3 F (36.8 C)  98.4 F (36.9 C)  TempSrc: Oral Oral  Oral  Resp: 16 18  16   Height:      Weight:    97 lb 4.8 oz (44.135 kg)  SpO2: 100% 100%  100%   General:  No distress Lungs:  Decreased breath sounds Heart:  RRR Abdomen:  Positive bowel sounds, no rebound no guarding Extremities:  No edema.  LABS: Lab Results  Component Value Date   TROPONINI <0.30 06/17/2012   Results for orders placed during the hospital encounter of 06/15/12 (from the past 24 hour(s))  CLOSTRIDIUM DIFFICILE BY PCR     Status: Normal   Collection Time   06/20/12  2:33 PM      Component Value Range   C difficile by pcr NEGATIVE  NEGATIVE  PROTIME-INR     Status: Abnormal   Collection Time   06/21/12  6:30 AM      Component Value Range   Prothrombin Time 26.6 (*) 11.6 - 15.2 seconds   INR 2.60 (*) 0.00 - 1.49  BASIC METABOLIC PANEL     Status: Abnormal   Collection Time   06/21/12  6:30 AM      Component Value Range   Sodium 137  135 - 145 mEq/L   Potassium 3.8  3.5 - 5.1 mEq/L   Chloride 94 (*) 96 - 112 mEq/L   CO2 38 (*) 19 - 32 mEq/L   Glucose, Bld 108 (*) 70 - 99 mg/dL   BUN 11  6 - 23 mg/dL   Creatinine, Ser 1.47  0.50 - 1.10 mg/dL   Calcium 9.2  8.4 - 82.9 mg/dL   GFR calc non Af Amer 77 (*) >90 mL/min   GFR calc Af Amer 90 (*) >90 mL/min    Intake/Output Summary (Last 24 hours) at 06/21/12 0825 Last data filed at 06/21/12 0656  Gross per 24 hour  Intake    120 ml  Output    300 ml  Net   -180 ml    ASSESSMENT AND PLAN:  CHF:  Acute on chronic diastolic CHF.  Continue current diuretic IV today.  Atrial flutter:  We could consider TEE cardioversion prior to discharge.  However, today she is quite frail and I would like to  defer and see how she responds over the weekend.   S/p MV replacement:  Bioprosthetic, stable on echo this admission.   Tricuspid regurgitation:  Continue diuresis and we will follow this.  (Note:  She requested transfer to [facility].  I will arrange this.)  Rollene Rotunda 06/21/2012 8:25 AM

## 2012-06-21 NOTE — Progress Notes (Signed)
ANTICOAGULATION CONSULT NOTE - Follow Up Consult  Pharmacy Consult for Coumadin Indication: atrial fibrillation  Assessment: 78yo female with AFib and bioprosthetic MVR being continued on coumadin. Prior to arrival dosing at nursing facility was 3mg  daily except 1mg  on Fridays with supra-therapuetic INR on admission to hospital.   INR remains therapeutic today at 2.60.  Hgb stable, no bleeding noted in chart.   Goal of Therapy:  INR 2-3 Monitor platelets by anticoagulation protocol: Yes   Plan:  1.  Repeat Coumadin 1mg  today 2.  Follow-up INR in AM   Benjaman Pott, PharmD    06/21/2012   9:16 AM   ------- Allergies  Allergen Reactions  . Promethazine Hcl Other (See Comments)    unknown  . Sulfonamide Derivatives Other (See Comments)    unknown  . Tequin Other (See Comments)    hallucinations     Patient Measurements: Height: 5' (152.4 cm) Weight: 97 lb 4.8 oz (44.135 kg) IBW/kg (Calculated) : 45.5     Vital Signs: Temp: 98.4 F (36.9 C) (11/18 0408) Temp src: Oral (11/18 0408) BP: 98/51 mmHg (11/18 0408) Pulse Rate: 63  (11/18 0408)  Labs:  Basename 06/21/12 0630 06/20/12 0650 06/19/12 0511  HGB -- 10.3* --  HCT -- 34.0* --  PLT -- 192 --  APTT -- -- --  LABPROT 26.6* 27.2* 24.6*  INR 2.60* 2.68* 2.34*  HEPARINUNFRC -- -- --  CREATININE 0.72 0.78 0.82  CKTOTAL -- -- --  CKMB -- -- --  TROPONINI -- -- --    Estimated Creatinine Clearance: 37.1 ml/min (by C-G formula based on Cr of 0.72).

## 2012-06-21 NOTE — Progress Notes (Signed)
Physical Therapy Treatment Patient Details Name: Nancy Bush MRN: [id number] DOB: [birth year] Today's Date: 06/21/2012 Time: 4098-1191 PT Time Calculation (min): 23 min  PT Assessment / Plan / Recommendation Comments on Treatment Session  Pt very frail appearing, tries to communicated non verbally instead of ?expending energy talking although SaO2 on RA with exertion are in the mid 90's.  EHr incr to 101 bpm.  Even with encouragement and feedback, pt does not breathe deeply enough.    Follow Up Recommendations  SNF;Supervision for mobility/OOB     Does the patient have the potential to tolerate intense rehabilitation  No, Recommend SNF  Barriers to Discharge        Equipment Recommendations  None recommended by PT    Recommendations for Other Services    Frequency Min 3X/week   Plan Discharge plan remains appropriate;Frequency remains appropriate    Precautions / Restrictions Precautions Precautions: Fall Restrictions Weight Bearing Restrictions: No   Pertinent Vitals/Pain EHR 101 bpm, SaO2 95% on RA during gait    Mobility  Bed Mobility Bed Mobility: Supine to Sit;Sitting - Scoot to Edge of Bed Supine to Sit: 5: Supervision;HOB elevated Sitting - Scoot to [place] of Bed: 5: Supervision Details for Bed Mobility Assistance: n assist needed, but the [facility] is up in sitting position Transfers Transfers: Sit to Stand;Stand to Sit Sit to Stand: With upper extremity assist;4: Min guard Stand to Sit: 4: Min guard;With upper extremity assist;To chair/3-in-1 Ambulation/Gait Ambulation/Gait Assistance: 4: Min guard Ambulation Distance (Feet): 85 Feet Assistive device: Rolling walker Ambulation/Gait Assistance Details: Gait characterized by short even steps, slow cadence with slumped frail posture Gait Pattern: Decreased step length - right;Decreased step length - left;Step-through pattern;Decreased stride length Gait velocity: decr Stairs: No Wheelchair Mobility Wheelchair  Mobility: No    Exercises     PT Diagnosis:    PT Problem List:   PT Treatment Interventions:     PT Goals Acute Rehab PT Goals PT Goal Formulation: With patient Time For Goal Achievement: 07/03/12 Potential to Achieve Goals: Good PT Goal: Supine/Side to Sit - Progress: Progressing toward goal PT Goal: Sit to Supine/Side - Progress: Progressing toward goal PT Goal: Sit to Stand - Progress: Progressing toward goal PT Goal: Stand to Sit - Progress: Progressing toward goal PT Goal: Ambulate - Progress: Progressing toward goal  Visit Information  Last PT Received On: 06/21/12 Assistance Needed: +1    Subjective Data  Subjective: Don't put my head down...   Cognition  Overall Cognitive Status: Appears within functional limits for tasks assessed/performed Arousal/Alertness: Awake/alert Orientation Level: Appears intact for tasks assessed Behavior During Session: Flat affect    Balance  Balance Balance Assessed: No Static Standing Balance Static Standing - Balance Support: No upper extremity supported;Bilateral upper extremity supported;During functional activity Static Standing - Level of Assistance: 5: Stand by assistance  End of Session PT - End of Session Activity Tolerance: Patient limited by fatigue Patient left: in chair;with call bell/phone within reach;with family/visitor present Nurse Communication: Mobility status   GP     Nancy Bush, Nancy Bush 06/21/2012, 12:09 PM 06/21/2012   Bing, PT [phone] (pager)

## 2012-06-21 NOTE — Clinical Social Work Note (Signed)
CSW following to assist with dc plans once pt is medically stable. Pt came to hospital from [facility] where she was staying for short-term rehab. Pt's family was paying to hold her bed at the snf but have since let the bed go. CSW spoke to facility who are agreeable to Pt returning once medically cleared. CSW will alert facility of pt's return as her care progresses.  No family at bedside at this time, though CSW has seen one of her sisters walking on the unit. CSW will continue to follow up with Pt and her family.   Frederico Hamman, LCSW [phone]

## 2012-06-21 NOTE — Progress Notes (Signed)
CARDIAC REHAB PHASE I   PRE:  Rate/Rhythm: paced 95  BP:  Supine:   Sitting: 108/62  Standing:    SaO2: 99%1L,96%RA  MODE:  Ambulation: 152 ft   POST:  Rate/Rhythem: 100  BP:  Supine:   Sitting: 92/60  Standing:    SaO2: 96%RA 1405-1443 Pt walked 152 ft on RA with rollator and asst x 2. Had to sit down twice to rest. Wanted to sit down more. Needs encouragement. Requested oxygen. Told pt her sats were good. Sats running 96% on RA. Encouraged pt to go to recliner. Tried to get pt to drink her ensure but she said she had to rest first. Very deconditioned. Call bell in reach.   Nancy Bush

## 2012-06-21 NOTE — Progress Notes (Signed)
[facility] PROGRESS NOTE  Nancy Bush UJW:[id number] DOB: Nancy Bush DOA: 06/15/2012 PCP: [name], MD  Assessment/Plan: Acute on chronic diastolic CHF  -Ejection fraction 50-55%  -continue furosemide to 40 mg IV every 12  -I/Os remain inaccurate in past 24 hrs but lost 0.3kg  -neg 5551cc/ -5.7 kg for the admission  -LE edema improving  -ProBNP 2527>>> 1745 Atrial fibrillation s/p MAZE procedure  -Rate controlled, sinus  -Continue warfarin  -Continue metoprolol  -Consider switching metoprolol to carvedilol  Dyspnea  -Likely multifactorial due to the patient's decompensated CHF, deconditioning, COPD, atrial fibrillation, anemia  -PRN xopenex  -OOB, PT eval  -hemoglobin stable  Tricuspid regurgitation  -Echocardiogram on 06/16/2012 revealed worsening tricuspid regurgitation compared to TEE on 02/09/2012  -Appreciate cardiology recommendations  Failure to thrive  -Pre-albumin 6.5 -Megace added yesterday (11/16)  -continue Ensure  -add beneprotein Status post mitral valve replacement  -bioprosthetic valve  COPD  -Continue roflumilast, low dose prednisone  Chest pain  -Resolved  -troponins-negative  -Chronic left bundle-branch block on EKG  Normocytic anemia  -Likely anemia of chronic disease  -Hemoglobin has remained stable   Disposition Plan: Back to SNF this week when ok with cardiology    Family Communication:   Updated family at beside      Procedures/Studies: Dg Chest 2 View  06/17/2012  *RADIOLOGY REPORT*  Clinical Data: Follow up effusions.  CHF.  CHEST - 2 VIEW  Comparison: 06/15/2012  Findings: Left chest wall pacer device is identified with leads in the right atrial appendage and right ventricle.  Heart size is normal.  Similar appearance of moderate bilateral pleural effusions with overlying atelectasis.  No interstitial edema.  No airspace consolidation.  IMPRESSION:  1.  No change in bilateral pleural effusions.   Original  Report Authenticated By: [name], M.D.    Dg Chest 2 View  06/15/2012  *RADIOLOGY REPORT*  Clinical Data: Left chest pain, weakness  CHEST - 2 VIEW  Comparison: Chest x-ray of 06/14/2012  Findings: There has been some increase in opacities at the lung bases most consistent with slight increase in effusions and basilar atelectasis.  Mild cardiomegaly is stable.  A dual lead permanent pacemaker remains.  The bones are osteopenic.  IMPRESSION: Some increase in volume of small effusions and basilar atelectasis bilaterally.   Original Report Authenticated By: [name], M.D.    Dg Chest 2 View  06/14/2012  *RADIOLOGY REPORT*  Clinical Data: Post MVR October 2013, shortness of breath  CHEST - 2 VIEW  Comparison: 05/27/2012  Findings: Left subclavian sequential transvenous pacemaker leads project at right atrium and right ventricle. Enlargement of cardiac silhouette post MVR. Mediastinal contours normal. Minimal pulmonary vascular congestion. Bibasilar effusions and atelectasis, increased on left. No pulmonary infiltrate or pneumothorax. Surgical clips right upper quadrant question cholecystectomy. Additional surgical clips right axilla. Bones appear demineralized. Question tiny left renal calculus.  IMPRESSION: Bibasilar effusions atelectasis, increased on left since previous study. Mild enlargement of cardiac silhouette minimal pulmonary vascular congestion post MVR and pacemaker.   Original Report Authenticated By: [name], M.D.    Dg Chest 2 View  05/27/2012  *RADIOLOGY REPORT*  Clinical Data: Weakness.  Pleural effusion.  CHEST - 2 VIEW  Comparison: PA and lateral chest 05/24/2012.  Findings: Small bilateral pleural effusions and basilar atelectasis persist.  The patient's right pleural effusion appears decreased in size. Small right apical pneumothorax, less than 5%, has decreased in size.  Associated basilar atelectasis is noted.  There is cardiomegaly without edema.  IMPRESSION:  1.  Some  decrease in a small right pleural effusion.  Tiny right apical pneumothorax is also decreased in size. 2.  No change in a very small left pleural effusion.   Original Report Authenticated By: [name]. [name], M.D.    Dg Chest 2 View  05/24/2012  *RADIOLOGY REPORT*  Clinical Data: Pacemaker placement  CHEST - 2 VIEW  Comparison: 05/23/2012  Findings: Stable asymmetric elevation of the right hemidiaphragm. Tiny bilateral pleural effusions persist.  Emphysema noted.  Tiny right apical pneumothorax is not substantially changed in the interval.  Left dual lead permanent pacemaker again noted. The cardiopericardial silhouette is enlarged.  IMPRESSION: Emphysema with tiny right apical pneumothorax, unchanged.  Tiny bilateral pleural effusions.   Original Report Authenticated By: [name], M.D.    Ct Head Wo Contrast  05/26/2012  *RADIOLOGY REPORT*  Clinical Data: Change in mental status.   Pt was ambulatory and talking yesterday. Not responding today.  CT HEAD WITHOUT CONTRAST  Technique:  Contiguous axial images were obtained from the base of the skull through the vertex without contrast.  Comparison: None.  Findings: The brain stem, cerebellum, cerebral peduncles, thalami, basal ganglia, basilar cisterns, and ventricular system appear unremarkable.  Minimal periventricular white matter hypodensity suggest mild chronic ischemic microvascular white matter disease.  No intracranial hemorrhage, mass lesion, or acute infarction is identified.  Incidental failure of fusion of the posterior arch of C1 noted.  IMPRESSION:  1.  No acute intracranial findings. 2.  Mild chronic ischemic microvascular white matter disease.   Original Report Authenticated By: [name], M.D.    Dg Chest Bilateral Decubitus  06/17/2012  *RADIOLOGY REPORT*  Clinical Data: Bilateral pleural effusions.  CHEST - BILATERAL DECUBITUS VIEW 6:05 p.m.  Comparison: 06/17/2012 at 5:56 p.m.  Findings: The left pleural effusion  layers laterally in the decubitus position with a small amount loculated at the base.  There is only a tiny right effusion with a tiny amount of layering laterally at the base but not extending superiorly.  There is also chronic blunting of the costophrenic angles bilaterally.  IMPRESSION: Partial layering of the bilateral pleural effusions laterally.   Original Report Authenticated By: Francene Boyers, M.D.    Dg Chest Port 1 View  05/23/2012  *RADIOLOGY REPORT*  Clinical Data: Post mitral valve replacement  PORTABLE CHEST - 1 VIEW  Comparison: 05/22/2012; 05/21/2012; 05/20/2012  Findings: Unchanged cardiac silhouette and mediastinal contours post mitral valve replacement.  Interval removal of right-sided chest tubes with unchanged tiny right apical pneumothorax. Unchanged small bilateral effusions, right greater than left. There is persistent mild elevation right hemidiaphragm.  Unchanged bibasilar heterogeneous opacities.  No new focal airspace opacity. No definite evidence of pulmonary edema.  Unchanged bones.  Post cholecystectomy.  IMPRESSION: 1.  Interval removal of right-sided chest tubes with unchanged tiny right apical pneumothorax. 2.  Unchanged small bilateral effusions and bibasilar opacities, right greater than left. 3.  No definite evidence of pulmonary edema.   Original Report Authenticated By: Waynard Reeds, M.D.          Subjective: Patient states that she is gradually improving, but remained short of breath. Breathing is better than at the time of admission. Denies any fevers, chills, chest pain, nausea, vomiting, diarrhea, abdominal pain. Appetite is a little better.  Objective: Filed Vitals:   06/20/12 2135 06/21/12 0408 06/21/12 0900 06/21/12 1040  BP: 108/64 98/51 107/66 111/56  Pulse: 76 63 102 102  Temp:  98.4 F (  36.9 C) 97.7 F (36.5 C)   TempSrc:  Oral Oral   Resp:  16 20   Height:      Weight:  44.135 kg (97 lb 4.8 oz)    SpO2:  100% 100%     Intake/Output  Summary (Last 24 hours) at 06/21/12 1315 Last data filed at 06/21/12 1106  Gross per 24 hour  Intake    120 ml  Output    550 ml  Net   -430 ml   Weight change: -0.227 kg (-8 oz) Exam:   General:  Pt is alert, follows commands appropriately, not in acute distress  HEENT: No icterus, No thrush,[place]/AT  Cardiovascular: RRR, no rubs, no gallops  Respiratory: Bibasilar crackles. No wheezes or rhonchi. Good air movement.  Abdomen: Soft/+BS, non tender, non distended, no guarding  Extremities: 1+ edema, No lymphangitis, No petechiae, No rashes, no synovitis  Data Reviewed: Basic Metabolic Panel:  Lab 06/21/12 1191 06/20/12 0650 06/19/12 0511 06/18/12 0430 06/17/12 0500  NA 137 138 138 135 137  K 3.8 3.5 3.8 3.9 3.6  CL 94* 93* 93* 94* 95*  CO2 38* 40* 40* 35* 35*  GLUCOSE 108* 112* 138* 113* 119*  BUN 11 10 10 8 9   CREATININE 0.72 0.78 0.82 0.78 0.80  CALCIUM 9.2 9.0 9.5 8.8 9.0  MG -- -- -- -- --  PHOS -- -- -- -- --   Liver Function Tests:  Lab 06/15/12 1708 06/14/12 1400  AST 19 19  ALT 11 12  ALKPHOS 99 77  BILITOT 0.4 0.5  PROT 6.2 5.7*  ALBUMIN 3.1* 3.2*   No results found for this basename: LIPASE:5,AMYLASE:5 in the last 168 hours No results found for this basename: AMMONIA:5 in the last 168 hours CBC:  Lab 06/20/12 0650 06/18/12 0430 06/17/12 0500 06/15/12 1708 06/14/12 1400  WBC 9.3 10.9* 11.4* 9.4 8.9  NEUTROABS -- -- -- -- --  HGB 10.3* 10.2* 10.2* 10.6* 10.5*  HCT 34.0* 33.8* 32.3* 33.7* 30.8*  MCV 92.4 92.3 92.6 90.3 86.8  PLT 192 207 228 237 245   Cardiac Enzymes:  Lab 06/17/12 2016 06/17/12 1536 06/15/12 1714  CKTOTAL -- -- --  CKMB -- -- --  CKMBINDEX -- -- --  TROPONINI <0.30 <0.30 <0.30   BNP: No components found with this basename: POCBNP:5 CBG: No results found for this basename: GLUCAP:5 in the last 168 hours  Recent Results (from the past 240 hour(s))  MRSA PCR SCREENING     Status: Normal   Collection Time   06/15/12 10:29 PM        Component Value Range Status Comment   MRSA by PCR NEGATIVE  NEGATIVE Final   CLOSTRIDIUM DIFFICILE BY PCR     Status: Normal   Collection Time   06/20/12  2:33 PM      Component Value Range Status Comment   C difficile by pcr NEGATIVE  NEGATIVE Final      Scheduled Meds:   . aspirin EC  81 mg Oral Daily  . calcium-vitamin D  1 tablet Oral BID  . docusate sodium  100 mg Oral BID  . feeding supplement  237 mL Oral BID BM  . folic acid  2 mg Oral Daily  . furosemide  40 mg Intravenous BID  . LORazepam  0.5 mg Intravenous Once  . megestrol  400 mg Oral Daily  . metoprolol  75 mg Oral BID  . potassium chloride  40 mEq Oral Daily  . predniSONE  2.5 mg Oral QODAY  . roflumilast  500 mcg Oral Daily  . sodium chloride  3 mL Intravenous Q12H  . sodium chloride  3 mL Intravenous Q12H  . [COMPLETED] warfarin  1 mg Oral ONCE-1800  . warfarin  1 mg Oral ONCE-1800  . Warfarin - Pharmacist Dosing Inpatient   Does not apply q1800   Continuous Infusions:    Davetta Olliff, DO  [facility] Pager [phone]  If 7PM-7AM, please contact night-coverage [email or web address] Password [email or web address] 06/21/2012, 1:15 PM   LOS: 6 days

## 2012-06-21 NOTE — Progress Notes (Addendum)
   CARDIOTHORACIC SURGERY PROGRESS NOTE  Subjective: Nancy Bush is well known to me.  I last saw her in the office 1 week ago.  I was not aware that she had been hospitalized, although I am not at all surprised.  This evening she reports slow improvement over the past few days.  Marginal appetite but eating some.  No chest pain.  No SOB.  Exercise tolerance still quite limited based upon Cardiac Rehab notes.  Objective: Vital signs in last 24 hours: Temp:  [97.7 F (36.5 C)-98.4 F (36.9 C)] 97.9 F (36.6 C) (11/18 1413) Pulse Rate:  [63-102] 96  (11/18 1413) Cardiac Rhythm:  [-] Atrial paced (11/18 0900) Resp:  [16-20] 20  (11/18 1413) BP: (98-111)/(51-66) 108/62 mmHg (11/18 1413) SpO2:  [100 %] 100 % (11/18 1413) Weight:  [44.135 kg (97 lb 4.8 oz)] 44.135 kg (97 lb 4.8 oz) (11/18 0408)  Physical Exam:  Rhythm:   Aflutter vs sinus w/ V-pacing  Breath sounds: Diminished at bases, R>L  Heart sounds:  RRR  Incisions:  Healing nicely  Abdomen:  Soft, non-tender  Extremities:  Warm, edema has resolved   Intake/Output from previous day: 11/17 0701 - 11/18 0700 In: 120 [P.O.:120] Out: 450 [Urine:450] Intake/Output this shift: Total I/O In: 120 [P.O.:120] Out: 251 [Urine:250; Stool:1]  Lab Results:  [facility] 06/20/12 0650  WBC 9.3  HGB 10.3*  HCT 34.0*  PLT 192   BMET:  Basename 06/21/12 0630 06/20/12 0650  NA 137 138  K 3.8 3.5  CL 94* 93*  CO2 38* 40*  GLUCOSE 108* 112*  BUN 11 10  CREATININE 0.72 0.78  CALCIUM 9.2 9.0    CBG (last 3)  No results found for this basename: GLUCAP:3 in the last 72 hours PT/INR:   Basename 06/21/12 0630  LABPROT 26.6*  INR 2.60*    CXR:  N/A  Assessment/Plan:  I agree with assessment and plans as outlined by Drs Antoine Poche and Tat.  Will continue to follow.  OWEN,CLARENCE H 06/21/2012 5:54 PM

## 2012-06-21 NOTE — Care Management Note (Unsigned)
    Page 1 of 1   06/21/2012     4:19:40 PM   CARE MANAGEMENT NOTE 06/21/2012  Patient:  Nancy Bush, Nancy Bush   Account Number:  [id number]  Date Initiated:  06/21/2012  Documentation initiated by:  Tamsin Nader  Subjective/Objective Assessment:   PT ADM WITH CHF EXACERBATION ON 06/15/12.  RECENT  MVR AND PPM INSERTION 10/13.  PTA, PT RESIDES AT [facility], WHERE SHE IS RECEIVING REHAB.     Action/Plan:   PT PLANS TO RETURN TO SNF AT DC.  WILL CONSULT CSW TO FACILITATE RETURN TO SNF WHEN MEDICALLY STABLE FOR DC.   Anticipated DC Date:  06/25/2012   Anticipated DC Plan:  SKILLED NURSING FACILITY      DC Planning Services  CM consult      Choice offered to / List presented to:             Status of service:  In process, will continue to follow Medicare Important Message given?   (If response is "NO", the following Medicare IM given date fields will be blank) Date Medicare IM given:   Date Additional Medicare IM given:    Discharge Disposition:    Per UR Regulation:  Reviewed for med. necessity/level of care/duration of stay  If discussed at Long Length of Stay Meetings, dates discussed:    Comments:

## 2012-06-22 MED ORDER — ACETAMINOPHEN 500 MG PO TABS
500.0000 mg | ORAL_TABLET | Freq: Four times a day (QID) | ORAL | Status: DC | PRN
  Filled 2012-06-22: qty 1

## 2012-06-22 MED ORDER — ENSURE COMPLETE PO LIQD
237.0000 mL | Freq: Three times a day (TID) | ORAL | Status: DC
  Administered 2012-06-22 – 2012-06-23 (×3): 237 mL via ORAL

## 2012-06-22 MED ORDER — WARFARIN SODIUM 1 MG PO TABS
1.0000 mg | ORAL_TABLET | Freq: Once | ORAL | Status: AC
  Administered 2012-06-22: 1 mg via ORAL
  Filled 2012-06-22: qty 1

## 2012-06-22 NOTE — Progress Notes (Signed)
ANTICOAGULATION CONSULT NOTE - Follow Up Consult  Pharmacy Consult for Coumadin Indication: atrial fibrillation  Assessment: 78yo female with AFib and bioprosthetic MVR being continued on coumadin. Prior to arrival dosing at nursing facility was 3mg  daily except 1mg  on Fridays with supra-therapuetic INR (3.54) on admission to hospital.   INR remains stable & therapeutic today at 2.60.  Hgb stable, no bleeding issues noted in chart.   Goal of Therapy:  INR 2-3 Monitor platelets by anticoagulation protocol: Yes   Plan:  1.  Repeat Coumadin 1mg  today 2.  Follow-up INR in AM  Benjaman Pott, PharmD    06/22/2012   9:55 AM   ------- Allergies  Allergen Reactions  . Promethazine Hcl Other (See Comments)    unknown  . Sulfonamide Derivatives Other (See Comments)    unknown  . Tequin Other (See Comments)    hallucinations     Patient Measurements: Height: 5' (152.4 cm) Weight: 97 lb (44 kg) IBW/kg (Calculated) : 45.5     Vital Signs: Temp: 98.3 F (36.8 C) (11/19 0946) Temp src: Oral (11/19 0946) BP: 112/76 mmHg (11/19 0946) Pulse Rate: 103  (11/19 0946)  Labs:  Basename 06/22/12 0500 06/21/12 0630 06/20/12 0650  HGB -- -- 10.3*  HCT -- -- 34.0*  PLT -- -- 192  APTT -- -- --  LABPROT 26.6* 26.6* 27.2*  INR 2.60* 2.60* 2.68*  HEPARINUNFRC -- -- --  CREATININE -- 0.72 0.78  CKTOTAL -- -- --  CKMB -- -- --  TROPONINI -- -- --    Estimated Creatinine Clearance: 37 ml/min (by C-G formula based on Cr of 0.72).

## 2012-06-22 NOTE — Progress Notes (Signed)
Patient Name: Nancy Bush Date of Encounter: 06/22/2012  Active Problems:  COPD  Shortness of breath  Atrial fibrillation  Encounter for long-term (current) use of anticoagulants  Acute on chronic diastolic heart failure  S/P mitral valve replacement with bioprosthetic valve  S/P Maze operation for atrial fibrillation  Pacemaker-[employer]  S/P MVR (mitral valve replacement)    SUBJECTIVE: Says she is breathing a little better today. Has to sit @ 45 degrees to breathe. Eating poorly by her report. Very weak.  OBJECTIVE Filed Vitals:   06/21/12 1413 06/21/12 2027 06/22/12 0205 06/22/12 0443  BP: 108/62 104/62  107/64  Pulse: 96 103  96  Temp: 97.9 F (36.6 C) 98 F (36.7 C)  97.5 F (36.4 C)  TempSrc: Oral Oral  Oral  Resp: 20 16  16   Height:      Weight:   97 lb (44 kg)   SpO2: 100% 97%  100%    Intake/Output Summary (Last 24 hours) at 06/22/12 0708 Last data filed at 06/22/12 0206  Gross per 24 hour  Intake    240 ml  Output    402 ml  Net   -162 ml   Filed Weights   06/20/12 0442 06/21/12 0408 06/22/12 0205  Weight: 97 lb 12.8 oz (44.362 kg) 97 lb 4.8 oz (44.135 kg) 97 lb (44 kg)   PHYSICAL EXAM General: Well developed, frail elderly female, in no acute distress. Head: Normocephalic, atraumatic.  Neck: Supple without bruits, JVD at 10 cm. Lungs:  Resp regular and unlabored, decreased BS bases, left > right. Heart: Irregular, S1, S2, no S3, S4, ?soft murmur. Abdomen: Soft, non-tender, non-distended, BS + x 4.  Extremities: No clubbing, cyanosis, no edema.  Neuro: Alert and oriented X 3. Moves all extremities spontaneously. Psych: Normal affect.  LABS: CBC: Basename 06/20/12 0650  WBC 9.3  NEUTROABS --  HGB 10.3*  HCT 34.0*  MCV 92.4  PLT 192   INR: Basename 06/22/12 0500  INR 2.60*   Basic Metabolic Panel: Basename 06/21/12 0630 06/20/12 0650  NA 137 138  K 3.8 3.5  CL 94* 93*  CO2 38* 40*  GLUCOSE 108* 112*  BUN 11 10    CREATININE 0.72 0.78  CALCIUM 9.2 9.0  MG -- --  PHOS -- --   BNP: Pro B Natriuretic peptide (BNP)  Date/Time Value Range Status  06/21/2012  9:15 AM 1745.0* 0 - 450 pg/mL Final  06/15/2012  5:14 PM 2390.0* 0 - 450 pg/mL Final   TELE:  V pacing, ?run of NSVT  Radiology/Studies: Dg Chest 2 View 06/17/2012  *RADIOLOGY REPORT*  Clinical Data: Follow up effusions.  CHF.  CHEST - 2 VIEW  Comparison: 06/15/2012  Findings: Left chest wall pacer device is identified with leads in the right atrial appendage and right ventricle.  Heart size is normal.  Similar appearance of moderate bilateral pleural effusions with overlying atelectasis.  No interstitial edema.  No airspace consolidation.  IMPRESSION:  1.  No change in bilateral pleural effusions.   Original Report Authenticated By: [name], M.D.   Dg Chest Bilateral Decubitus 06/17/2012  *RADIOLOGY REPORT*  Clinical Data: Bilateral pleural effusions.  CHEST - BILATERAL DECUBITUS VIEW 6:05 p.m.  Comparison: 06/17/2012 at 5:56 p.m.  Findings: The left pleural effusion layers laterally in the decubitus position with a small amount loculated at the base.  There is only a tiny right effusion with a tiny amount of layering laterally at the base but not extending superiorly.  There is also chronic blunting of the costophrenic angles bilaterally.  IMPRESSION: Partial layering of the bilateral pleural effusions laterally.   Original Report Authenticated By: Francene Boyers, M.D.    Current Medications:    . aspirin EC  81 mg Oral Daily  . calcium-vitamin D  1 tablet Oral BID  . docusate sodium  100 mg Oral BID  . feeding supplement  237 mL Oral BID BM  . folic acid  2 mg Oral Daily  . furosemide  40 mg Intravenous BID  . LORazepam  0.5 mg Intravenous Once  . megestrol  400 mg Oral Daily  . metoprolol  75 mg Oral BID  . potassium chloride  40 mEq Oral Daily  . predniSONE  2.5 mg Oral QODAY  . protein supplement  1 scoop Oral TID WC  .  roflumilast  500 mcg Oral Daily  . sodium chloride  3 mL Intravenous Q12H  . sodium chloride  3 mL Intravenous Q12H  . [COMPLETED] warfarin  1 mg Oral ONCE-1800  . Warfarin - Pharmacist Dosing Inpatient   Does not apply q1800      ASSESSMENT AND PLAN: Active Problems:  COPD  Shortness of breath  Atrial fibrillation  Encounter for long-term (current) use of anticoagulants  Acute on chronic diastolic heart failure  S/P mitral valve replacement with bioprosthetic valve  S/P Maze operation for atrial fibrillation  Pacemaker-[employer]  S/P MVR (mitral valve replacement)  CHF:  Acute on chronic diastolic CHF. Continue current diuretic IV today. Pro BNP has decreased but no change in weight. I/O show minimal PO intake, not much UOP either, ?completeness. On previous decub film, left effusion was layering. MD advise on repeating 2 view and decub to follow effusion, may need thoracentesis but INR too high today.   Atrial flutter:  Rate OK. Consider TEE/DCCV before discharge. On metoprolol, no doses held, would continue metoprolol (as opposed to Coreg) as her EF is preserved and she needs rate control.  S/p MV replacement:  Bioprosthetic, stable on echo this admission.   Tricuspid regurgitation:  Continue diuresis and we will follow this.  Signed, Theodore Demark , PA-C 7:08 AM 06/22/2012  History and all data above reviewed.  Patient examined.  I agree with the findings as above.  The patient exam reveals COR: Irregular  ,  Lungs: Clear  ,  Abd: Positive bowel sounds, no rebound no guarding, Ext No edema  .  All available labs, radiology testing, previous records reviewed. Agree with documented assessment and plan. Today we will continue diuresis.  Telemetry seems to have paroxysms of flutter with slow rate with sinus tachycardia with paced rate.  I will repeat an EKG.   I will continue IV diuresis and change in PO in the am pending repeat CXR.   Rollene Rotunda  6:47 PM   06/22/2012

## 2012-06-22 NOTE — Progress Notes (Signed)
[facility] PROGRESS NOTE  Manar Smalling Dao ZOX:[id number] DOB: Oct 09, 1925 DOA: 06/15/2012 PCP: [name], MD  Assessment/Plan: Acute on chronic diastolic CHF  -Ejection fraction 50-55%  -continue furosemide to 40 mg IV every 12  -I/Os remain inaccurate in past 48 hrs but lost 0.4kg (48hr) -neg 5693cc/ -5.8 kg for the admission  -LE edema improving  -ProBNP 2527>>> 1745  -CXR in am Atrial fibrillation s/p MAZE procedure  -Rate controlled, sinus  -Continue warfarin  -Continue metoprolol  -Consider switching metoprolol to carvedilol  Dyspnea  -Likely multifactorial due to the patient's decompensated CHF, deconditioning, COPD, atrial fibrillation, anemia  -PRN xopenex  -OOB, PT eval  -hemoglobin stable  Tricuspid regurgitation  -Echocardiogram on 06/16/2012 revealed worsening tricuspid regurgitation compared to TEE on 02/09/2012  -Appreciate cardiology recommendations  -Appreciate CT surgery f/u Failure to thrive  -Pre-albumin 6.5  -Megace added yesterday (11/16)  -continue Ensure  -add beneprotein  Status post mitral valve replacement  -bioprosthetic valve  COPD  -Continue roflumilast, low dose prednisone  Chest pain  -Resolved  -troponins-negative  -Chronic left bundle-branch block on EKG  Normocytic anemia  -Likely anemia of chronic disease  -Hemoglobin has remained stable  Disposition Plan: Back to SNF this week when ok with cardiology  Family Communication: Updated family at beside     Family Communication:   Pt at beside Disposition Plan:   Home when medically stable     Procedures/Studies: Dg Chest 2 View  06/17/2012  *RADIOLOGY REPORT*  Clinical Data: Follow up effusions.  CHF.  CHEST - 2 VIEW  Comparison: 06/15/2012  Findings: Left chest wall pacer device is identified with leads in the right atrial appendage and right ventricle.  Heart size is normal.  Similar appearance of moderate bilateral pleural effusions with overlying  atelectasis.  No interstitial edema.  No airspace consolidation.  IMPRESSION:  1.  No change in bilateral pleural effusions.   Original Report Authenticated By: [name], M.D.    Dg Chest 2 View  06/15/2012  *RADIOLOGY REPORT*  Clinical Data: Left chest pain, weakness  CHEST - 2 VIEW  Comparison: Chest x-ray of 06/14/2012  Findings: There has been some increase in opacities at the lung bases most consistent with slight increase in effusions and basilar atelectasis.  Mild cardiomegaly is stable.  A dual lead permanent pacemaker remains.  The bones are osteopenic.  IMPRESSION: Some increase in volume of small effusions and basilar atelectasis bilaterally.   Original Report Authenticated By: [name], M.D.    Dg Chest 2 View  06/14/2012  *RADIOLOGY REPORT*  Clinical Data: Post MVR October 2013, shortness of breath  CHEST - 2 VIEW  Comparison: 05/27/2012  Findings: Left subclavian sequential transvenous pacemaker leads project at right atrium and right ventricle. Enlargement of cardiac silhouette post MVR. Mediastinal contours normal. Minimal pulmonary vascular congestion. Bibasilar effusions and atelectasis, increased on left. No pulmonary infiltrate or pneumothorax. Surgical clips right upper quadrant question cholecystectomy. Additional surgical clips right axilla. Bones appear demineralized. Question tiny left renal calculus.  IMPRESSION: Bibasilar effusions atelectasis, increased on left since previous study. Mild enlargement of cardiac silhouette minimal pulmonary vascular congestion post MVR and pacemaker.   Original Report Authenticated By: [name], M.D.    Dg Chest 2 View  05/27/2012  *RADIOLOGY REPORT*  Clinical Data: Weakness.  Pleural effusion.  CHEST - 2 VIEW  Comparison: PA and lateral chest 05/24/2012.  Findings: Small bilateral pleural effusions and basilar atelectasis persist.  The patient's right pleural effusion appears decreased in size.  Small right apical pneumothorax, less  than 5%, has decreased in size.  Associated basilar atelectasis is noted.  There is cardiomegaly without edema.  IMPRESSION:  1.  Some decrease in a small right pleural effusion.  Tiny right apical pneumothorax is also decreased in size. 2.  No change in a very small left pleural effusion.   Original Report Authenticated By: [name]. [name], M.D.    Dg Chest 2 View  05/24/2012  *RADIOLOGY REPORT*  Clinical Data: Pacemaker placement  CHEST - 2 VIEW  Comparison: 05/23/2012  Findings: Stable asymmetric elevation of the right hemidiaphragm. Tiny bilateral pleural effusions persist.  Emphysema noted.  Tiny right apical pneumothorax is not substantially changed in the interval.  Left dual lead permanent pacemaker again noted. The cardiopericardial silhouette is enlarged.  IMPRESSION: Emphysema with tiny right apical pneumothorax, unchanged.  Tiny bilateral pleural effusions.   Original Report Authenticated By: [name], M.D.    Ct Head Wo Contrast  05/26/2012  *RADIOLOGY REPORT*  Clinical Data: Change in mental status.   Pt was ambulatory and talking yesterday. Not responding today.  CT HEAD WITHOUT CONTRAST  Technique:  Contiguous axial images were obtained from the base of the skull through the vertex without contrast.  Comparison: None.  Findings: The brain stem, cerebellum, cerebral peduncles, thalami, basal ganglia, basilar cisterns, and ventricular system appear unremarkable.  Minimal periventricular white matter hypodensity suggest mild chronic ischemic microvascular white matter disease.  No intracranial hemorrhage, mass lesion, or acute infarction is identified.  Incidental failure of fusion of the posterior arch of C1 noted.  IMPRESSION:  1.  No acute intracranial findings. 2.  Mild chronic ischemic microvascular white matter disease.   Original Report Authenticated By: [name], M.D.    Dg Chest Bilateral Decubitus  06/17/2012  *RADIOLOGY REPORT*  Clinical Data: Bilateral  pleural effusions.  CHEST - BILATERAL DECUBITUS VIEW 6:05 p.m.  Comparison: 06/17/2012 at 5:56 p.m.  Findings: The left pleural effusion layers laterally in the decubitus position with a small amount loculated at the base.  There is only a tiny right effusion with a tiny amount of layering laterally at the base but not extending superiorly.  There is also chronic blunting of the costophrenic angles bilaterally.  IMPRESSION: Partial layering of the bilateral pleural effusions laterally.   Original Report Authenticated By: Francene Boyers, M.D.          Subjective: Patient continues to do better. She continues to complain of some dyspnea on exertion. Denies any chest pain, nausea, vomiting, diarrhea, dysuria, fevers, chills  Objective: Filed Vitals:   06/22/12 0205 06/22/12 0443 06/22/12 0946 06/22/12 1315  BP:  107/64 112/76 104/64  Pulse:  96 103 78  Temp:  97.5 F (36.4 C) 98.3 F (36.8 C) 98 F (36.7 C)  TempSrc:  Oral Oral Oral  Resp:  16 18 19   Height:      Weight: 44 kg (97 lb)     SpO2:  100% 100% 99%    Intake/Output Summary (Last 24 hours) at 06/22/12 1729 Last data filed at 06/22/12 1230  Gross per 24 hour  Intake    610 ml  Output    751 ml  Net   -141 ml   Weight change: -0.135 kg (-4.8 oz) Exam:   General:  Pt is alert, follows commands appropriately, not in acute distress  HEENT: No icterus, No thrush, No neck mass, [place]/AT  Cardiovascular: RRR, no rubs, no gallops  Respiratory: Fine basilar crackles. No  wheezes or rhonchi. Good air movement.  Abdomen: Soft/+BS, non tender, non distended, no guarding  Extremities: trace edema, No lymphangitis, No petechiae, No rashes, no synovitis  Data Reviewed: Basic Metabolic Panel:  Lab 06/21/12 4540 06/20/12 0650 06/19/12 0511 06/18/12 0430 06/17/12 0500  NA 137 138 138 135 137  K 3.8 3.5 3.8 3.9 3.6  CL 94* 93* 93* 94* 95*  CO2 38* 40* 40* 35* 35*  GLUCOSE 108* 112* 138* 113* 119*  BUN 11 10 10 8 9     CREATININE 0.72 0.78 0.82 0.78 0.80  CALCIUM 9.2 9.0 9.5 8.8 9.0  MG -- -- -- -- --  PHOS -- -- -- -- --   Liver Function Tests: No results found for this basename: AST:5,ALT:5,ALKPHOS:5,BILITOT:5,PROT:5,ALBUMIN:5 in the last 168 hours No results found for this basename: LIPASE:5,AMYLASE:5 in the last 168 hours No results found for this basename: AMMONIA:5 in the last 168 hours CBC:  Lab 06/20/12 0650 06/18/12 0430 06/17/12 0500  WBC 9.3 10.9* 11.4*  NEUTROABS -- -- --  HGB 10.3* 10.2* 10.2*  HCT 34.0* 33.8* 32.3*  MCV 92.4 92.3 92.6  PLT 192 207 228   Cardiac Enzymes:  Lab 06/17/12 2016 06/17/12 1536  CKTOTAL -- --  CKMB -- --  CKMBINDEX -- --  TROPONINI <0.30 <0.30   BNP: No components found with this basename: POCBNP:5 CBG: No results found for this basename: GLUCAP:5 in the last 168 hours  Recent Results (from the past 240 hour(s))  MRSA PCR SCREENING     Status: Normal   Collection Time   06/15/12 10:29 PM      Component Value Range Status Comment   MRSA by PCR NEGATIVE  NEGATIVE Final   CLOSTRIDIUM DIFFICILE BY PCR     Status: Normal   Collection Time   06/20/12  2:33 PM      Component Value Range Status Comment   C difficile by pcr NEGATIVE  NEGATIVE Final      Scheduled Meds:   . aspirin EC  81 mg Oral Daily  . calcium-vitamin D  1 tablet Oral BID  . docusate sodium  100 mg Oral BID  . feeding supplement  237 mL Oral TID WC  . folic acid  2 mg Oral Daily  . furosemide  40 mg Intravenous BID  . LORazepam  0.5 mg Intravenous Once  . megestrol  400 mg Oral Daily  . metoprolol  75 mg Oral BID  . potassium chloride  40 mEq Oral Daily  . predniSONE  2.5 mg Oral QODAY  . protein supplement  1 scoop Oral TID WC  . roflumilast  500 mcg Oral Daily  . sodium chloride  3 mL Intravenous Q12H  . sodium chloride  3 mL Intravenous Q12H  . warfarin  1 mg Oral ONCE-1800  . Warfarin - Pharmacist Dosing Inpatient   Does not apply q1800  . [DISCONTINUED] feeding  supplement  237 mL Oral BID BM   Continuous Infusions:    Laurance Heide, DO  [facility] Pager [phone]  If 7PM-7AM, please contact night-coverage [email or web address] Password [email or web address] 06/22/2012, 5:29 PM   LOS: 7 days

## 2012-06-22 NOTE — Clinical Documentation Improvement (Signed)
MALNUTRITION DOCUMENTATION CLARIFICATION   THIS DOCUMENT IS NOT A PERMANENT PART OF THE MEDICAL RECORD  Please update your documentation within the medical record to reflect your response to this query.                                                                                         06/22/12   Dr. Malachi Bonds and/or Associates,  In a better effort to capture your patient's Severity of Illness, reflect appropriate Length of Stay and Utilization of Resources, a review of the patient medical record has revealed the following indicators:  "Comments on Treatment Session Still not eating well and it shows with poor tolerance to activity."  Kate Sable, PT 06/22/12  1532 Progress Note  "Failure to thrive  -Pre-albumin 6.5  -Megace added yesterday (11/16)  -continue Ensure  -add beneprotein" TAT, DAVID, DO  06/21/2012, 1:15 PM Progress Note  Weight Trend    (has been receiving Lasix for Heart Failure this Admission) 05/11/12    115 lbs   Pre-Cabg 05/17/12    114 lbs   Pre-Cabg 05/18/12    Surgery 06/14/12    110 lbs   Post-Cabg 06/15/12    109 lbs 06/17/12    106 lbs 06/18/12    101 lbs 06/19/12      99 lbs 06/20/12      97 lbs 06/21/12      97 lbs 06/22/12      97 lbs  Appetite described as "marginal" and "poor".     Based on your clinical judgment, please document in the progress notes and discharge summary if a condition below provides greater specificity regarding the patient's nutritional status:   - malnutrition, mild, moderate, severe   - other condition   - unable to clinically determine    In responding to this query please exercise your independent judgment.    The fact that a query is asked, does not imply that any particular answer is desired or expected.   Reviewed: 06/25/12 - never addressed after reassignment and emails.  Mathis Dad RN Thank You,  Jerral Ralph  RN BSN CCDS Certified Clinical Documentation Specialist: Cell    [phone]  Health Information Management [place]   TO RESPOND TO THE THIS QUERY, FOLLOW THE INSTRUCTIONS BELOW:  1. If needed, update documentation for the patient's encounter via the notes activity.  2. Access this query again and click edit on the In [employer].  3. After updating, or not, click F2 to complete all highlighted (required) fields concerning your review. Select "additional documentation in the medical record" OR "no additional documentation provided".  4. Click Sign note button.  5. The deficiency will fall out of your In Basket *Please let us know if you are not able to complete this workflow by phone or e-mail (listed below).

## 2012-06-22 NOTE — Progress Notes (Signed)
CARDIAC REHAB PHASE I   PRE:  Rate/Rhythm: paced 90  BP:  Supine:   Sitting: 102/60  Standing:    SaO2: 100%2L  MODE:  Ambulation: 200 ft   POST:  Rate/Rhythem: 94  BP:  Supine:   Sitting: 106/66  Standing:    SaO2: 100%2L 1120-1152 Pt walked 200 ft with rollator and gait belt on 2L oxygen with two sitting rest breaks. Encouraged pt to go farther without sitting. Pt had requested that we take oxygen on walk. To recliner after walk and set up lunch. Sister in room to help encourage pt to eat.  Encouragement given to pt that she was able to go farther today.  Duanne Limerick

## 2012-06-22 NOTE — Progress Notes (Signed)
Physical Therapy Treatment Patient Details Name: Nancy Bush MRN: [id number] DOB: 1925/08/23 Today's Date: 06/22/2012 Time: 4098-1191 PT Time Calculation (min): 25 min  PT Assessment / Plan / Recommendation Comments on Treatment Session  Pt appears improve and was wearing a smile for the first time.  Still not eating well and it shows with poor tolerance to activity.    Follow Up Recommendations  SNF;Supervision for mobility/OOB     Does the patient have the potential to tolerate intense rehabilitation  No, Recommend SNF  Barriers to Discharge        Equipment Recommendations  None recommended by PT    Recommendations for Other Services    Frequency Min 3X/week   Plan Discharge plan remains appropriate;Frequency remains appropriate    Precautions / Restrictions Precautions Precautions: Fall   Pertinent Vitals/Pain EHR 100 bpm;  SaO2 on 2L during gait  99% on 2L    Mobility  Bed Mobility Bed Mobility: Not assessed Transfers Transfers: Sit to Stand;Stand to Sit;Stand Pivot Transfers Sit to Stand: 4: Min guard;From chair/3-in-1 Stand to Sit: 4: Min guard;With upper extremity assist;To chair/3-in-1 Stand Pivot Transfers: 4: Min guard Details for Transfer Assistance: reinforced using UE's for safety Ambulation/Gait Ambulation/Gait Assistance: 4: Min guard Ambulation Distance (Feet): 50 Feet (times 3 with short sitting rests between) Assistive device: Rolling walker Ambulation/Gait Assistance Details: short guarded steps, slowed cadence, frequent need to cue her to stand upright and look down the hall Gait Pattern: Decreased step length - right;Decreased step length - left;Step-through pattern;Decreased stride length Gait velocity: decr Stairs: No Wheelchair Mobility Wheelchair Mobility: No    Exercises     PT Diagnosis:    PT Problem List:   PT Treatment Interventions:     PT Goals Acute Rehab PT Goals Time For Goal Achievement: 07/03/12 Potential to  Achieve Goals: Good PT Goal: Supine/Side to Sit - Progress: Progressing toward goal PT Goal: Sit to Supine/Side - Progress: Progressing toward goal PT Goal: Sit to Stand - Progress: Progressing toward goal PT Goal: Stand to Sit - Progress: Progressing toward goal PT Goal: Ambulate - Progress: Progressing toward goal  Visit Information  Last PT Received On: 06/22/12 Assistance Needed: +1    Subjective Data  Subjective: I'm feeling a little better.... I need to sit down   Cognition  Overall Cognitive Status: Appears within functional limits for tasks assessed/performed Arousal/Alertness: Awake/alert Orientation Level: Appears intact for tasks assessed Behavior During Session: [facility] for tasks performed (had a smile on her face for the first time today)    Balance     End of Session PT - End of Session Activity Tolerance: Patient limited by fatigue Patient left: in chair;with call bell/phone within reach;with family/visitor present Nurse Communication: Mobility status   GP     Remmi Armenteros, Eliseo Gum 06/22/2012, 3:31 PM 06/22/2012  [place], PT [phone] (pager)

## 2012-06-22 NOTE — Progress Notes (Addendum)
                   [address]            [name] [address]          [phone]           Subjective: Patient states breathing is better.  Objective: Vital signs in last 24 hours: Temp:  [97.5 F (36.4 C)-98 F (36.7 C)] 97.5 F (36.4 C) (11/19 0443) Pulse Rate:  [96-103] 96  (11/19 0443) Cardiac Rhythm:  [-] Atrial paced (11/18 2100) Resp:  [16-20] 16  (11/19 0443) BP: (104-111)/(56-66) 107/64 mmHg (11/19 0443) SpO2:  [97 %-100 %] 100 % (11/19 0443) Weight:  [97 lb (44 kg)] 97 lb (44 kg) (11/19 0205)   Current Weight  06/22/12 97 lb (44 kg)      Intake/Output from previous day: 11/18 0701 - 11/19 0700 In: 240 [P.O.:240] Out: 402 [Urine:400; Stool:2]   Physical Exam:  Cardiovascular: RRR, Pulmonary: Diminished at bases R>L; no rales, wheezes, or rhonchi. Abdomen: Soft, non tender, bowel sounds present. Extremities: Trace bilateral lower extremity edema. Wound: Clean and dry.  No erythema or signs of infection.  Lab Results: CBC: Basename 06/20/12 0650  WBC 9.3  HGB 10.3*  HCT 34.0*  PLT 192   BMET:  Basename 06/21/12 0630 06/20/12 0650  NA 137 138  K 3.8 3.5  CL 94* 93*  CO2 38* 40*  GLUCOSE 108* 112*  BUN 11 10  CREATININE 0.72 0.78  CALCIUM 9.2 9.0    PT/INR:  Lab Results  Component Value Date   INR 2.60* 06/22/2012   INR 2.60* 06/21/2012   INR 2.68* 06/20/2012   ABG:  INR: Will add last result for INR, ABG once components are confirmed Will add last 4 CBG results once components are confirmed  Assessment/Plan:  1. CV - s/p PPM 10/18 (by Dr. Ladona Ridgel) and  minimally invasive mitral valve replacement and Maze on 05/18/2012 by Dr. Cornelius Moras. On Lopressor 75 bid and Coumadin. Previous Aflutter now SR. 2. Acute on chronic diastolic CHF - BNP down to 1745 this am (previously 2390). On Lasix 40 IV bid 3.  Acute blood loss anemia - Last H and H 10.3 and 34 4.Will order CXR for am to evaluate pleural effusions 5.Management per  cardiology and medicine  ZIMMERMAN,DONIELLE MPA-C 06/22/2012,7:12 AM   I have seen and examined the patient and agree with the assessment and plan as outlined.  Overall clinically stable and CHF improved but still extremely weak and appetite poor.  Needs continuous encouragement.  OWEN,CLARENCE H 06/22/2012 7:49 AM

## 2012-06-23 ENCOUNTER — Inpatient Hospital Stay (HOSPITAL_COMMUNITY): Payer: Medicare Other

## 2012-06-23 DIAGNOSIS — Z950 Presence of cardiac pacemaker: Secondary | ICD-10-CM

## 2012-06-23 DIAGNOSIS — Z9889 Other specified postprocedural states: Secondary | ICD-10-CM

## 2012-06-23 LAB — BASIC METABOLIC PANEL
Chloride: 93 mEq/L — ABNORMAL LOW (ref 96–112)
GFR calc Af Amer: >90 mL/min (ref >90)
Potassium: 3.9 mEq/L (ref 3.5–5.1)

## 2012-06-23 LAB — PROTIME-INR
INR: 2.14 — ABNORMAL HIGH (ref 0.00–1.49)
Prothrombin Time: 23 seconds — ABNORMAL HIGH (ref 11.6–15.2)

## 2012-06-23 MED ORDER — WHITE PETROLATUM GEL: Status: DC | PRN

## 2012-06-23 MED ORDER — FUROSEMIDE 40 MG PO TABS
40.0000 mg | ORAL_TABLET | Freq: Two times a day (BID) | ORAL | Status: DC
  Administered 2012-06-23 – 2012-06-24 (×3): 40 mg via ORAL
  Filled 2012-06-23 (×5): qty 1

## 2012-06-23 MED ORDER — WARFARIN SODIUM 2 MG PO TABS
2.0000 mg | ORAL_TABLET | Freq: Once | ORAL | Status: AC
  Administered 2012-06-23: 2 mg via ORAL
  Filled 2012-06-23: qty 1

## 2012-06-23 NOTE — Progress Notes (Signed)
   SUBJECTIVE:  Very frail appearing.  Denies SOB but she has some soreness on her bottom   PHYSICAL EXAM Filed Vitals:   06/22/12 0946 06/22/12 1315 06/22/12 1940 06/23/12 0422  BP: 112/76 104/64 110/59 92/61  Pulse: 103 78 102 97  Temp: 98.3 F (36.8 C) 98 F (36.7 C) 97.5 F (36.4 C) 97.8 F (36.6 C)  TempSrc: Oral Oral Oral Oral  Resp: 18 19 18 18   Height:      Weight:    95 lb 10.9 oz (43.4 kg)  SpO2: 100% 99% 100% 100%   General:  No distress Lungs:  Decreased breath sounds Heart:  Regular Abdomen:  Positive bowel sounds, no rebound no guarding Extremities:  No edema.  LABS:  Results for orders placed during the hospital encounter of 06/15/12 (from the past 24 hour(s))  PROTIME-INR     Status: Abnormal   Collection Time   06/23/12  4:10 AM      Component Value Range   Prothrombin Time 23.0 (*) 11.6 - 15.2 seconds   INR 2.14 (*) 0.00 - 1.49  BASIC METABOLIC PANEL     Status: Abnormal   Collection Time   06/23/12  4:10 AM      Component Value Range   Sodium 134 (*) 135 - 145 mEq/L   Potassium 3.9  3.5 - 5.1 mEq/L   Chloride 93 (*) 96 - 112 mEq/L   CO2 35 (*) 19 - 32 mEq/L   Glucose, Bld 104 (*) 70 - 99 mg/dL   BUN 12  6 - 23 mg/dL   Creatinine, Ser 1.61  0.50 - 1.10 mg/dL   Calcium 8.9  8.4 - 09.6 mg/dL   GFR calc non Af Amer 79 (*) >90 mL/min   GFR calc Af Amer >90  >90 mL/min    Intake/Output Summary (Last 24 hours) at 06/23/12 0630 Last data filed at 06/23/12 0438  Gross per 24 hour  Intake    730 ml  Output   1800 ml  Net  -1070 ml   CXR:  Partial layering of the bilateral pleural effusions laterally.   ASSESSMENT AND PLAN:  CHF:  Acute on chronic diastolic CHF.  Weight is down 14 lbs since this admit.  Breathing better but not at baseline.  Bilateral pleural effusions persist.  I will discuss this with Dr. Cornelius Moras.    Atrial flutter:  I will consider DCCV before discharge.    S/p MV replacement:  Bioprosthetic, stable on echo this admission.    Tricuspid regurgitation:  Continue diuresis and we will follow this.   Nancy Bush [facility] 06/23/2012 6:30 AM

## 2012-06-23 NOTE — Progress Notes (Addendum)
                   [address]            [place] [address]          [phone]           Subjective: Patient has been frail appearing, denies SOB  Objective: Vital signs in last 24 hours: Temp:  [97.5 F (36.4 C)-98.3 F (36.8 C)] 97.8 F (36.6 C) (11/20 0422) Pulse Rate:  [78-103] 97  (11/20 0422) Cardiac Rhythm:  [-] Ventricular paced (11/19 2000) Resp:  [18-19] 18  (11/20 0422) BP: (92-112)/(59-76) 92/61 mmHg (11/20 0422) SpO2:  [99 %-100 %] 100 % (11/20 0422) Weight:  [95 lb 10.9 oz (43.4 kg)] 95 lb 10.9 oz (43.4 kg) (11/20 0422)   Current Weight  06/23/12 95 lb 10.9 oz (43.4 kg)      Intake/Output from previous day: 11/19 0701 - 11/20 0700 In: 730 [P.O.:730] Out: 1800 [Urine:1800]   Physical Exam:  Cardiovascular: V paced on tele, RRR Pulmonary: Diminished at bases ; no rales, wheezes, or rhonchi. Abdomen: Soft, non tender, bowel sounds present. Extremities: Trace bilateral lower extremity edema. Wound: Clean and dry.  No erythema or signs of infection.  Lab Results: CBC:No results found for this basename: WBC:2,HGB:2,HCT:2,PLT:2 in the last 72 hours BMET:   [facility] 06/23/12 0410 06/21/12 0630  NA 134* 137  K 3.9 3.8  CL 93* 94*  CO2 35* 38*  GLUCOSE 104* 108*  BUN 12 11  CREATININE 0.67 0.72  CALCIUM 8.9 9.2    PT/INR:  Lab Results  Component Value Date   INR 2.14* 06/23/2012   INR 2.60* 06/22/2012   INR 2.60* 06/21/2012   ABG:  INR: Will add last result for INR, ABG once components are confirmed Will add last 4 CBG results once components are confirmed  Assessment/Plan:  1. CV - s/p PPM 10/18 (by Dr. Ladona Ridgel) and  minimally invasive mitral valve replacement and Maze on 05/18/2012 by Dr. Cornelius Moras. On Lopressor 75 bid and Coumadin. Previous Aflutter now SR. 2. Acute on chronic diastolic CHF - BNP down to 1745  (previously 2390). On Lasix 40 IV bid 3.  Acute blood loss anemia - Last H and H 10.3 and  34 4.Pulmonary-Initial CXR upon admission did show layering of pleural effusions. She has been diuresed. CXR this am shows bilateral pleural effusions (perhaps slightly improved on left on lateral view from previous CXR). Dr. Cornelius Moras to evaluate 5.Failure to thrive-on Megace, ensure, beneprotein 6.Management per cardiology and medicine  ZIMMERMAN,DONIELLE MPA-C 06/23/2012,7:19 AM    I have seen and examined the patient and agree with the assessment and plan as outlined.  Pleural effusions are very small, not large enough to warrant thoracentesis at this time.  Sabrea Sankey H 06/23/2012 9:16 AM

## 2012-06-23 NOTE — Progress Notes (Signed)
CARDIAC REHAB PHASE I   PRE:  Rate/Rhythm: 94paced  BP:  Supine:   Sitting: 114/63  Standing:    SaO2: 100%1.5L  MODE:  Ambulation: 250 ft   POST:  Rate/Rhythem: 95  BP:  Supine:   Sitting: 111/67  Standing:    SaO2: 99%2L 1050-1125 Pt assisted to [facility] and then helped to get cleaned up. Pt walked 250 ft on 2L with rollator and gait belt. Sat twice to rest. Today is pt's birthday. Gave pt lots of encouragement and positive reinforcement. To recliner after walk. Sister in room.  Duanne Limerick

## 2012-06-23 NOTE — Progress Notes (Signed)
ANTICOAGULATION CONSULT NOTE - Follow Up Consult  Pharmacy Consult for Coumadin Indication: atrial fibrillation  Assessment: 78 yo female with AFib and bioprosthetic MVR being continued on coumadin. Prior to arrival dosing at nursing facility was 3mg  daily except 1mg  on Fridays with supra-therapuetic INR (3.54) on admission to hospital.   INR remains  therapeutic today at 2.14. CBC pm 11/17 stable, no bleeding issues noted in chart.   Goal of Therapy:  INR 2-3 Monitor platelets by anticoagulation protocol: Yes   Plan:  1.  Coumadin 2 mg  Coumadin  today 2.  Follow-up INR in AM  Herby Abraham, Pharm.D. [phone] 06/23/2012 10:20 AM    ------- Allergies  Allergen Reactions  . Promethazine Hcl Other (See Comments)    unknown  . Sulfonamide Derivatives Other (See Comments)    unknown  . Tequin Other (See Comments)    hallucinations     Patient Measurements: Height: 5' (152.4 cm) Weight: 95 lb 10.9 oz (43.4 kg) IBW/kg (Calculated) : 45.5     Vital Signs: Temp: 97.8 F (36.6 C) (11/20 0422) Temp src: Oral (11/20 0422) BP: 119/58 mmHg (11/20 1007) Pulse Rate: 101  (11/20 1007)  Labs:  Basename 06/23/12 0410 06/22/12 0500 06/21/12 0630  HGB -- -- --  HCT -- -- --  PLT -- -- --  APTT -- -- --  LABPROT 23.0* 26.6* 26.6*  INR 2.14* 2.60* 2.60*  HEPARINUNFRC -- -- --  CREATININE 0.67 -- 0.72  CKTOTAL -- -- --  CKMB -- -- --  TROPONINI -- -- --    Estimated Creatinine Clearance: 35.9 ml/min (by C-G formula based on Cr of 0.67).

## 2012-06-23 NOTE — Progress Notes (Signed)
[facility] PROGRESS NOTE  Nancy Bush UJW:[id number] DOB: 1925-11-18 DOA: 06/15/2012 PCP: [name], MD  Assessment/Plan: Acute on chronic diastolic CHF  -Ejection fraction 50-55%  -continue furosemide to 40 mg PO every 12  -I/Os demonstrate -1L yesterday -neg 6.5L for the admission  -CXR demonstrates persistent effusions, but improvement on the left side:  Drs. Cornelius Moras and Hochrein discussing  Atrial fibrillation and flutter s/p MAZE procedure, INR at goal and currently paced rhythm on monitor.  Chronic left bundle-branch block on ECG -Continue warfarin  -Continue metoprolol  -Continue metoprolol and cardiology to consider DCCV prior to discharge  Dyspnea, stable from yesterday -Likely multifactorial due to the patient's decompensated CHF, deconditioning, COPD, atrial fibrillation, anemia  -PRN xopenex  -hemoglobin stable as of 11/17  Tricuspid regurgitation  -Echocardiogram on 06/16/2012 revealed worsening tricuspid regurgitation compared to TEE on 02/09/2012  -Appreciate cardiology recommendations  -Appreciate CT surgery f/u  Failure to thrive  -Pre-albumin 6.5  -Megace added 11/16 -continue Ensure and beneprotein  Status post mitral valve replacement  -bioprosthetic valve   COPD  -Continue roflumilast, low dose prednisone   Normocytic anemia  -Likely anemia of chronic disease  -Hemoglobin has remained stable   DIET:  Healthy heart  ACCESS:  PIV IVF:  OFF PROPH:  Therapeutic warfarin  CODE:  full Family Communication:   Pt at beside Disposition Plan:   Back to SNF after diuresed further    Procedures/Studies: Dg Chest 2 View  06/17/2012  *RADIOLOGY REPORT*  Clinical Data: Follow up effusions.  CHF.  CHEST - 2 VIEW  Comparison: 06/15/2012  Findings: Left chest wall pacer device is identified with leads in the right atrial appendage and right ventricle.  Heart size is normal.  Similar appearance of moderate bilateral pleural effusions with  overlying atelectasis.  No interstitial edema.  No airspace consolidation.  IMPRESSION:  1.  No change in bilateral pleural effusions.   Original Report Authenticated By: [name], M.D.    Dg Chest 2 View  06/15/2012  *RADIOLOGY REPORT*  Clinical Data: Left chest pain, weakness  CHEST - 2 VIEW  Comparison: Chest x-ray of 06/14/2012  Findings: There has been some increase in opacities at the lung bases most consistent with slight increase in effusions and basilar atelectasis.  Mild cardiomegaly is stable.  A dual lead permanent pacemaker remains.  The bones are osteopenic.  IMPRESSION: Some increase in volume of small effusions and basilar atelectasis bilaterally.   Original Report Authenticated By: [name], M.D.    Dg Chest 2 View  06/14/2012  *RADIOLOGY REPORT*  Clinical Data: Post MVR October 2013, shortness of breath  CHEST - 2 VIEW  Comparison: 05/27/2012  Findings: Left subclavian sequential transvenous pacemaker leads project at right atrium and right ventricle. Enlargement of cardiac silhouette post MVR. Mediastinal contours normal. Minimal pulmonary vascular congestion. Bibasilar effusions and atelectasis, increased on left. No pulmonary infiltrate or pneumothorax. Surgical clips right upper quadrant question cholecystectomy. Additional surgical clips right axilla. Bones appear demineralized. Question tiny left renal calculus.  IMPRESSION: Bibasilar effusions atelectasis, increased on left since previous study. Mild enlargement of cardiac silhouette minimal pulmonary vascular congestion post MVR and pacemaker.   Original Report Authenticated By: [name], M.D.    Dg Chest 2 View  05/27/2012  *RADIOLOGY REPORT*  Clinical Data: Weakness.  Pleural effusion.  CHEST - 2 VIEW  Comparison: PA and lateral chest 05/24/2012.  Findings: Small bilateral pleural effusions and basilar atelectasis persist.  The patient's right pleural effusion appears decreased in  size. Small right apical  pneumothorax, less than 5%, has decreased in size.  Associated basilar atelectasis is noted.  There is cardiomegaly without edema.  IMPRESSION:  1.  Some decrease in a small right pleural effusion.  Tiny right apical pneumothorax is also decreased in size. 2.  No change in a very small left pleural effusion.   Original Report Authenticated By: [name]. [name], M.D.    Dg Chest 2 View  05/24/2012  *RADIOLOGY REPORT*  Clinical Data: Pacemaker placement  CHEST - 2 VIEW  Comparison: 05/23/2012  Findings: Stable asymmetric elevation of the right hemidiaphragm. Tiny bilateral pleural effusions persist.  Emphysema noted.  Tiny right apical pneumothorax is not substantially changed in the interval.  Left dual lead permanent pacemaker again noted. The cardiopericardial silhouette is enlarged.  IMPRESSION: Emphysema with tiny right apical pneumothorax, unchanged.  Tiny bilateral pleural effusions.   Original Report Authenticated By: [name], M.D.    Ct Head Wo Contrast  05/26/2012  *RADIOLOGY REPORT*  Clinical Data: Change in mental status.   Pt was ambulatory and talking yesterday. Not responding today.  CT HEAD WITHOUT CONTRAST  Technique:  Contiguous axial images were obtained from the base of the skull through the vertex without contrast.  Comparison: None.  Findings: The brain stem, cerebellum, cerebral peduncles, thalami, basal ganglia, basilar cisterns, and ventricular system appear unremarkable.  Minimal periventricular white matter hypodensity suggest mild chronic ischemic microvascular white matter disease.  No intracranial hemorrhage, mass lesion, or acute infarction is identified.  Incidental failure of fusion of the posterior arch of C1 noted.  IMPRESSION:  1.  No acute intracranial findings. 2.  Mild chronic ischemic microvascular white matter disease.   Original Report Authenticated By: [name], M.D.    Dg Chest Bilateral Decubitus  06/17/2012  *RADIOLOGY REPORT*  Clinical  Data: Bilateral pleural effusions.  CHEST - BILATERAL DECUBITUS VIEW 6:05 p.m.  Comparison: 06/17/2012 at 5:56 p.m.  Findings: The left pleural effusion layers laterally in the decubitus position with a small amount loculated at the base.  There is only a tiny right effusion with a tiny amount of layering laterally at the base but not extending superiorly.  There is also chronic blunting of the costophrenic angles bilaterally.  IMPRESSION: Partial layering of the bilateral pleural effusions laterally.   Original Report Authenticated By: Francene Boyers, M.D.          Subjective: Patient states she feels about the same as yesterday.  She continues to complain of some dyspnea on exertion and a little at rest also. Denies any chest pain, nausea, vomiting, diarrhea.  She has some pain around her anus which is an intermittent problem for her and which she states gets better with vaseline.    Objective: Filed Vitals:   06/22/12 1315 06/22/12 1940 06/23/12 0422 06/23/12 1007  BP: 104/64 110/59 92/61 119/58  Pulse: 78 102 97 101  Temp: 98 F (36.7 C) 97.5 F (36.4 C) 97.8 F (36.6 C)   TempSrc: Oral Oral Oral   Resp: 19 18 18    Height:      Weight:   43.4 kg (95 lb 10.9 oz)   SpO2: 99% 100% 100%     Intake/Output Summary (Last 24 hours) at 06/23/12 1039 Last data filed at 06/23/12 0730  Gross per 24 hour  Intake    730 ml  Output   1700 ml  Net   -970 ml   Weight change: -0.6 kg (-1 lb 5.2 oz) Exam:  General:  CF, no acute distress  HEENT:  MMM  Cardiovascular: RRR, no rubs, no gallops  Respiratory: Persistent fine basilar crackles. No wheezes or rhonchi.   Abdomen: Soft/+BS, non tender, non distended, no guarding  Extremities:  No LEE  Data Reviewed: Basic Metabolic Panel:  Lab 06/23/12 8119 06/21/12 0630 06/20/12 0650 06/19/12 0511 06/18/12 0430  NA 134* 137 138 138 135  K 3.9 3.8 3.5 3.8 3.9  CL 93* 94* 93* 93* 94*  CO2 35* 38* 40* 40* 35*  GLUCOSE 104* 108* 112*  138* 113*  BUN 12 11 10 10 8   CREATININE 0.67 0.72 0.78 0.82 0.78  CALCIUM 8.9 9.2 9.0 9.5 8.8  MG -- -- -- -- --  PHOS -- -- -- -- --   Liver Function Tests: No results found for this basename: AST:5,ALT:5,ALKPHOS:5,BILITOT:5,PROT:5,ALBUMIN:5 in the last 168 hours No results found for this basename: LIPASE:5,AMYLASE:5 in the last 168 hours No results found for this basename: AMMONIA:5 in the last 168 hours CBC:  Lab 06/20/12 0650 06/18/12 0430 06/17/12 0500  WBC 9.3 10.9* 11.4*  NEUTROABS -- -- --  HGB 10.3* 10.2* 10.2*  HCT 34.0* 33.8* 32.3*  MCV 92.4 92.3 92.6  PLT 192 207 228   Cardiac Enzymes:  Lab 06/17/12 2016 06/17/12 1536  CKTOTAL -- --  CKMB -- --  CKMBINDEX -- --  TROPONINI <0.30 <0.30   BNP: No components found with this basename: POCBNP:5 CBG: No results found for this basename: GLUCAP:5 in the last 168 hours  Recent Results (from the past 240 hour(s))  MRSA PCR SCREENING     Status: Normal   Collection Time   06/15/12 10:29 PM      Component Value Range Status Comment   MRSA by PCR NEGATIVE  NEGATIVE Final   CLOSTRIDIUM DIFFICILE BY PCR     Status: Normal   Collection Time   06/20/12  2:33 PM      Component Value Range Status Comment   C difficile by pcr NEGATIVE  NEGATIVE Final      Scheduled Meds:    . aspirin EC  81 mg Oral Daily  . calcium-vitamin D  1 tablet Oral BID  . docusate sodium  100 mg Oral BID  . feeding supplement  237 mL Oral TID WC  . folic acid  2 mg Oral Daily  . furosemide  40 mg Oral BID  . LORazepam  0.5 mg Intravenous Once  . megestrol  400 mg Oral Daily  . metoprolol  75 mg Oral BID  . potassium chloride  40 mEq Oral Daily  . predniSONE  2.5 mg Oral QODAY  . protein supplement  1 scoop Oral TID WC  . roflumilast  500 mcg Oral Daily  . sodium chloride  3 mL Intravenous Q12H  . sodium chloride  3 mL Intravenous Q12H  . [COMPLETED] warfarin  1 mg Oral ONCE-1800  . warfarin  2 mg Oral ONCE-1800  . Warfarin -  Pharmacist Dosing Inpatient   Does not apply q1800  . [DISCONTINUED] furosemide  40 mg Intravenous BID   Continuous Infusions:    Renae Fickle, MD  [facility] Pager [phone]  If 7PM-7AM, please contact night-coverage [email or web address] Password [email or web address] 06/23/2012, 10:39 AM   LOS: 8 days

## 2012-06-24 ENCOUNTER — Telehealth: Payer: Self-pay | Admitting: Cardiology

## 2012-06-24 DIAGNOSIS — Z954 Presence of other heart-valve replacement: Secondary | ICD-10-CM | POA: Diagnosis not present

## 2012-06-24 DIAGNOSIS — I4891 Unspecified atrial fibrillation: Secondary | ICD-10-CM | POA: Diagnosis not present

## 2012-06-24 DIAGNOSIS — I5033 Acute on chronic diastolic (congestive) heart failure: Secondary | ICD-10-CM | POA: Diagnosis not present

## 2012-06-24 DIAGNOSIS — I442 Atrioventricular block, complete: Secondary | ICD-10-CM | POA: Diagnosis not present

## 2012-06-24 DIAGNOSIS — E039 Hypothyroidism, unspecified: Secondary | ICD-10-CM | POA: Diagnosis not present

## 2012-06-24 DIAGNOSIS — R5381 Other malaise: Secondary | ICD-10-CM | POA: Diagnosis not present

## 2012-06-24 DIAGNOSIS — H9390 Unspecified disorder of ear, unspecified ear: Secondary | ICD-10-CM | POA: Diagnosis not present

## 2012-06-24 DIAGNOSIS — J309 Allergic rhinitis, unspecified: Secondary | ICD-10-CM | POA: Diagnosis not present

## 2012-06-24 DIAGNOSIS — Z9089 Acquired absence of other organs: Secondary | ICD-10-CM | POA: Diagnosis not present

## 2012-06-24 DIAGNOSIS — I4892 Unspecified atrial flutter: Secondary | ICD-10-CM | POA: Diagnosis not present

## 2012-06-24 DIAGNOSIS — I509 Heart failure, unspecified: Secondary | ICD-10-CM | POA: Diagnosis not present

## 2012-06-24 DIAGNOSIS — I270 Primary pulmonary hypertension: Secondary | ICD-10-CM | POA: Diagnosis not present

## 2012-06-24 DIAGNOSIS — R627 Adult failure to thrive: Secondary | ICD-10-CM | POA: Diagnosis not present

## 2012-06-24 DIAGNOSIS — I059 Rheumatic mitral valve disease, unspecified: Secondary | ICD-10-CM | POA: Diagnosis not present

## 2012-06-24 DIAGNOSIS — R0602 Shortness of breath: Secondary | ICD-10-CM | POA: Diagnosis not present

## 2012-06-24 DIAGNOSIS — J398 Other specified diseases of upper respiratory tract: Secondary | ICD-10-CM | POA: Diagnosis not present

## 2012-06-24 DIAGNOSIS — I5032 Chronic diastolic (congestive) heart failure: Secondary | ICD-10-CM | POA: Diagnosis not present

## 2012-06-24 DIAGNOSIS — N185 Chronic kidney disease, stage 5: Secondary | ICD-10-CM | POA: Diagnosis not present

## 2012-06-24 DIAGNOSIS — E785 Hyperlipidemia, unspecified: Secondary | ICD-10-CM | POA: Diagnosis not present

## 2012-06-24 DIAGNOSIS — I10 Essential (primary) hypertension: Secondary | ICD-10-CM | POA: Diagnosis not present

## 2012-06-24 DIAGNOSIS — F4321 Adjustment disorder with depressed mood: Secondary | ICD-10-CM | POA: Diagnosis not present

## 2012-06-24 DIAGNOSIS — D649 Anemia, unspecified: Secondary | ICD-10-CM | POA: Diagnosis not present

## 2012-06-24 DIAGNOSIS — I079 Rheumatic tricuspid valve disease, unspecified: Secondary | ICD-10-CM | POA: Diagnosis not present

## 2012-06-24 DIAGNOSIS — R279 Unspecified lack of coordination: Secondary | ICD-10-CM | POA: Diagnosis not present

## 2012-06-24 DIAGNOSIS — I5040 Unspecified combined systolic (congestive) and diastolic (congestive) heart failure: Secondary | ICD-10-CM | POA: Diagnosis not present

## 2012-06-24 DIAGNOSIS — Z9889 Other specified postprocedural states: Secondary | ICD-10-CM | POA: Diagnosis not present

## 2012-06-24 DIAGNOSIS — J449 Chronic obstructive pulmonary disease, unspecified: Secondary | ICD-10-CM | POA: Diagnosis not present

## 2012-06-24 DIAGNOSIS — M6281 Muscle weakness (generalized): Secondary | ICD-10-CM | POA: Diagnosis not present

## 2012-06-24 DIAGNOSIS — J90 Pleural effusion, not elsewhere classified: Secondary | ICD-10-CM

## 2012-06-24 DIAGNOSIS — E559 Vitamin D deficiency, unspecified: Secondary | ICD-10-CM | POA: Diagnosis not present

## 2012-06-24 DIAGNOSIS — I495 Sick sinus syndrome: Secondary | ICD-10-CM | POA: Diagnosis not present

## 2012-06-24 DIAGNOSIS — I2510 Atherosclerotic heart disease of native coronary artery without angina pectoris: Secondary | ICD-10-CM | POA: Diagnosis not present

## 2012-06-24 DIAGNOSIS — N390 Urinary tract infection, site not specified: Secondary | ICD-10-CM | POA: Diagnosis not present

## 2012-06-24 DIAGNOSIS — E46 Unspecified protein-calorie malnutrition: Secondary | ICD-10-CM | POA: Diagnosis not present

## 2012-06-24 DIAGNOSIS — R109 Unspecified abdominal pain: Secondary | ICD-10-CM | POA: Diagnosis not present

## 2012-06-24 LAB — BASIC METABOLIC PANEL
GFR calc Af Amer: >90 mL/min (ref >90)
GFR calc non Af Amer: 78 mL/min — ABNORMAL LOW (ref >90)
Potassium: 3.9 mEq/L (ref 3.5–5.1)
Sodium: 137 mEq/L (ref 135–145)

## 2012-06-24 LAB — PROTIME-INR: Prothrombin Time: 20.5 seconds — ABNORMAL HIGH (ref 11.6–15.2)

## 2012-06-24 MED ORDER — ACETAMINOPHEN 500 MG PO TABS: 500.0000 mg | ORAL_TABLET | Freq: Four times a day (QID) | ORAL | Status: DC | PRN

## 2012-06-24 MED ORDER — ASPIRIN 81 MG PO TBEC: 81.0000 mg | DELAYED_RELEASE_TABLET | Freq: Every day | ORAL | Status: DC

## 2012-06-24 MED ORDER — LORATADINE 10 MG PO TABS: 10.0000 mg | ORAL_TABLET | Freq: Every day | ORAL | Status: DC

## 2012-06-24 MED ORDER — MEGESTROL ACETATE 400 MG/10ML PO SUSP: 400.0000 mg | Freq: Every day | ORAL | Status: DC

## 2012-06-24 MED ORDER — NITROGLYCERIN 0.4 MG SL SUBL: 0.4000 mg | SUBLINGUAL_TABLET | SUBLINGUAL | Status: DC | PRN

## 2012-06-24 MED ORDER — POTASSIUM CHLORIDE CRYS ER 20 MEQ PO TBCR: 40.0000 meq | EXTENDED_RELEASE_TABLET | Freq: Every day | ORAL | Status: DC

## 2012-06-24 MED ORDER — BENEPROTEIN PO POWD: 1.0000 | Freq: Three times a day (TID) | ORAL | Status: DC

## 2012-06-24 MED ORDER — WARFARIN SODIUM 2 MG PO TABS
2.0000 mg | ORAL_TABLET | Freq: Every day | ORAL | Status: DC
  Filled 2012-06-24: qty 1

## 2012-06-24 MED ORDER — WARFARIN SODIUM 1 MG PO TABS: 2.0000 mg | ORAL_TABLET | Freq: Every day | ORAL | Status: DC

## 2012-06-24 MED ORDER — LORATADINE 10 MG PO TABS
10.0000 mg | ORAL_TABLET | Freq: Every day | ORAL | Status: DC
  Administered 2012-06-24: 10 mg via ORAL
  Filled 2012-06-24: qty 1

## 2012-06-24 MED ORDER — ENSURE COMPLETE PO LIQD: 237.0000 mL | Freq: Three times a day (TID) | ORAL | Status: DC

## 2012-06-24 MED ORDER — MAGNESIUM HYDROXIDE 400 MG/5ML PO SUSP: 30.0000 mL | Freq: Every day | ORAL | Status: DC | PRN

## 2012-06-24 NOTE — Progress Notes (Signed)
Seen and agree with SPT note Manvir Thorson Tabor Bayley [place], PT [phone]  

## 2012-06-24 NOTE — Progress Notes (Signed)
CARDIAC REHAB PHASE I   PRE:  Rate/Rhythm: 104 Pacing  BP:  Supine:   Sitting: 98/58  Standing:    SaO2: 100 1 1/2 L  MODE:  Ambulation: 300 ft   POST:  Rate/Rhythem: 108  BP:  Supine:   Sitting: 110/66  Standing:    SaO2: 100 2L 0940-1005 Assisted X 2 used rollater and gait belt to ambulate. Gait steady with walker. VS stable Pt able to walk 300 feet with one sitting rest stop. Pt back to recliner after walk with call light in reach. Pt mood seems a little brighter today.  Beatrix Fetters

## 2012-06-24 NOTE — Telephone Encounter (Signed)
Transitional Care Patient per Ward Givens

## 2012-06-24 NOTE — Progress Notes (Addendum)
                   [address]            [place] [address]          [phone]           Subjective: Patient has been frail appearing, denies SOB. Has complaints of sinus drainage. Has allergies and is requesting Claritin  Objective: Vital signs in last 24 hours: Temp:  [97.7 F (36.5 C)-98 F (36.7 C)] 97.9 F (36.6 C) (11/21 0457) Pulse Rate:  [89-106] 89  (11/21 0457) Cardiac Rhythm:  [-] Ventricular paced (11/20 2030) Resp:  [16-18] 18  (11/21 0457) BP: (96-121)/(58-72) 96/63 mmHg (11/21 0457) SpO2:  [100 %] 100 % (11/21 0457) Weight:  [94 lb 14.4 oz (43.046 kg)] 94 lb 14.4 oz (43.046 kg) (11/21 0457)   Current Weight  06/24/12 94 lb 14.4 oz (43.046 kg)      Intake/Output from previous day: 11/20 0701 - 11/21 0700 In: 480 [P.O.:480] Out: 100 [Urine:100]   Physical Exam:  Cardiovascular: V paced on tele, tachy Pulmonary: Diminished at bases ; no rales, wheezes, or rhonchi. Abdomen: Soft, non tender, bowel sounds present. Extremities: Trace bilateral lower extremity edema. Wound: Clean and dry.  No erythema or signs of infection.  Lab Results: CBC:No results found for this basename: WBC:2,HGB:2,HCT:2,PLT:2 in the last 72 hours BMET:   [facility] 06/24/12 0500 06/23/12 0410  NA 137 134*  K 3.9 3.9  CL 95* 93*  CO2 33* 35*  GLUCOSE 98 104*  BUN 11 12  CREATININE 0.68 0.67  CALCIUM 9.1 8.9    PT/INR:  Lab Results  Component Value Date   INR 1.83* 06/24/2012   INR 2.14* 06/23/2012   INR 2.60* 06/22/2012   ABG:  INR: Will add last result for INR, ABG once components are confirmed Will add last 4 CBG results once components are confirmed  Assessment/Plan:  1. CV - s/p PPM 10/18 (by Dr. Ladona Ridgel) and  minimally invasive mitral valve replacement and Maze on 05/18/2012 by Dr. Cornelius Moras. On Lopressor 75 bid and Coumadin. Previous Aflutter now SR. 2. Acute on chronic diastolic CHF - BNP down to 1745  (previously 2390). On Lasix 40 po  bid 3.  Acute blood loss anemia - Last H and H 10.3 and 34 4.Pulmonary-Initial CXR upon admission did show layering of pleural effusions. She has been diuresed. No need for thoracentesis at this time 5.Failure to thrive-on Megace, ensure, beneprotein 6.Claritin daily 7.Management per cardiology and medicine  ZIMMERMAN,DONIELLE MPA-C 06/24/2012,8:14 AM      I have seen and examined the patient and agree with the assessment and plan as outlined.  Jovan Schickling H 06/24/2012 8:34 AM

## 2012-06-24 NOTE — Clinical Social Work Note (Signed)
CSW confirmed SNF bed ready at [facility]. Pt medically cleared for dc to SNF. CSW faxed dc info to SNF and called ambulance for transfer. Pt's sisters have been at bedside all day. Pt is generally in good spirits.   Frederico Hamman, LCSW [phone]

## 2012-06-24 NOTE — Progress Notes (Signed)
Physical Therapy Treatment Patient Details Name: Nancy Bush MRN: [id number] DOB: 04/25/1926 Today's Date: 06/24/2012 Time: 7829-5621 PT Time Calculation (min): 26 min  PT Assessment / Plan / Recommendation Comments on Treatment Session  Pt. admitted with SOB and volume overload; Pt. progressing with activity tolerance but requires max encouragement to continue. Pt. requests O2 even though she does not need it; stayed above 96% during activity on room air. Pt. instructed to perform LE exercises 3x/day on her own; pt. stated she can only so do much at a time. Pts. sister present during tx; stated that pt. will be d/c to SNF today.    Follow Up Recommendations  SNF;Supervision for mobility/OOB           Equipment Recommendations  None recommended by PT          Plan Discharge plan remains appropriate;Frequency remains appropriate    Precautions / Restrictions Precautions Precautions: Fall Restrictions Weight Bearing Restrictions: No   Pertinent Vitals/Pain O2 96% room air at lowest during ambulation; 99% room air at end of tx but pt. requested to nasal cannula placed.    Mobility  Transfers Sit to Stand: 4: Min guard;From chair/3-in-1 Stand to Sit: 4: Min guard;To chair/3-in-1 Details for Transfer Assistance: Performed x2 from chair to [facility] after ambulation the [facility] to chair. Pt. able to use proper hand placement without cueing. Ambulation/Gait Ambulation/Gait Assistance: 4: Min guard Ambulation Distance (Feet): 200 Feet Assistive device: Rolling walker Ambulation/Gait Assistance Details: Pt. required cueing to stay inside RW and to look up while ambulating. Max encouragement to continue walking; pt. stated she needed her O2 but informed pt. that her O2 was at 96% and above.  Gait Pattern: Step-through pattern;Decreased stride length Gait velocity: decreased Stairs: No    Exercises General Exercises - Lower Extremity Ankle Circles/Pumps: AROM;Both;10  reps;Seated Long Arc Quad: AROM;Both;10 reps;Seated Hip Flexion/Marching: AROM;Both;10 reps;Seated     PT Goals Acute Rehab PT Goals PT Goal: Sit to Stand - Progress: Progressing toward goal PT Goal: Stand to Sit - Progress: Progressing toward goal PT Goal: Ambulate - Progress: Progressing toward goal  Visit Information  Last PT Received On: 06/24/12 Assistance Needed: +1    Subjective Data  Subjective: "I'm leaving soon."   Cognition  Overall Cognitive Status: Appears within functional limits for tasks assessed/performed Arousal/Alertness: Awake/alert Orientation Level: Appears intact for tasks assessed Behavior During Session: [facility] for tasks performed       End of Session PT - End of Session Equipment Utilized During Treatment: Gait belt Activity Tolerance: Patient tolerated treatment well Patient left: in chair;with call bell/phone within reach;with family/visitor present (Pts. sister) Nurse Communication: Mobility status     Army Chaco SPT 06/24/2012, 4:45 PM

## 2012-06-24 NOTE — Clinical Social Work Note (Signed)
CSW confirmed SNF bed ready at [facility] for Pt today if she is medically cleared for dc.  CSW will continue to follow up to facilitate dc when Pt is ready.   Frederico Hamman, LCSW [phone]

## 2012-06-24 NOTE — Telephone Encounter (Signed)
No answer and no way to leave message. Pt. may not have been discharged from hosp. yet. Will call back.

## 2012-06-24 NOTE — Progress Notes (Signed)
Patient Name: Nancy Bush Date of Encounter: 06/24/2012   Principal Problem:  *Acute on chronic diastolic heart failure  **Weight down from 109->94 lbs this admission. Active Problems:  S/P mitral valve replacement with bioprosthetic valve  Atrial flutter  **V paced with underlying aflutter.  Pleural effusion  COPD  Atrial fibrillation s/p Maze  Encounter for long-term (current) use of anticoagulants  Pacemaker-[employer]  CKD (chronic kidney disease), stage III   SUBJECTIVE  No chest pain/dyspnea.  For d/c to SNF today.  CURRENT MEDS    . aspirin EC  81 mg Oral Daily  . calcium-vitamin D  1 tablet Oral BID  . docusate sodium  100 mg Oral BID  . feeding supplement  237 mL Oral TID WC  . folic acid  2 mg Oral Daily  . furosemide  40 mg Oral BID  . loratadine  10 mg Oral Daily  . LORazepam  0.5 mg Intravenous Once  . megestrol  400 mg Oral Daily  . metoprolol  75 mg Oral BID  . potassium chloride  40 mEq Oral Daily  . predniSONE  2.5 mg Oral QODAY  . protein supplement  1 scoop Oral TID WC  . roflumilast  500 mcg Oral Daily  . sodium chloride  3 mL Intravenous Q12H  . sodium chloride  3 mL Intravenous Q12H  . [COMPLETED] warfarin  2 mg Oral ONCE-1800  . warfarin  2 mg Oral q1800  . Warfarin - Pharmacist Dosing Inpatient   Does not apply q1800   OBJECTIVE  Filed Vitals:   06/23/12 2013 06/24/12 0457 06/24/12 1218 06/24/12 1350  BP: 111/72 96/63 109/64 102/66  Pulse: 106 89 111 97  Temp: 98 F (36.7 C) 97.9 F (36.6 C)  97.6 F (36.4 C)  TempSrc: Oral Oral  Oral  Resp: 16 18  18   Height:      Weight:  94 lb 14.4 oz (43.046 kg)    SpO2: 100% 100%  100%    Intake/Output Summary (Last 24 hours) at 06/24/12 1410 Last data filed at 06/24/12 1314  Gross per 24 hour  Intake      0 ml  Output    450 ml  Net   -450 ml   Filed Weights   06/22/12 0205 06/23/12 0422 06/24/12 0457  Weight: 97 lb (44 kg) 95 lb 10.9 oz (43.4 kg) 94 lb 14.4 oz  (43.046 kg)   PHYSICAL EXAM  General: No distress  Lungs: Decreased breath sounds  Heart: Regular  Abdomen: Positive bowel sounds, no rebound no guarding  Extremities: No edema. Neuro:  Intact and nonfocal  Accessory Clinical Findings  CBC Lab Results  Component Value Date   WBC 9.3 06/20/2012   HGB 10.3* 06/20/2012   HCT 34.0* 06/20/2012   MCV 92.4 06/20/2012   PLT 192 06/20/2012   Basic Metabolic Panel  Basename 06/24/12 0500 06/23/12 0410  NA 137 134*  K 3.9 3.9  CL 95* 93*  CO2 33* 35*  GLUCOSE 98 104*  BUN 11 12  CREATININE 0.68 0.67  CALCIUM 9.1 8.9  MG -- --  PHOS -- --   Liver Function Tests Lab Results  Component Value Date   ALT 11 06/15/2012   AST 19 06/15/2012   ALKPHOS 99 06/15/2012   BILITOT 0.4 06/15/2012   Cardiac Enzymes Lab Results  Component Value Date   TROPONINI <0.30 06/17/2012   Thyroid Function Tests Lab Results  Component Value Date   TSH 1.385 06/16/2012  TELE  V paced with underlying aflutter, rates 90's.  Radiology/Studies  Dg Chest 2 View  06/23/2012  *RADIOLOGY REPORT*  Clinical Data: Post mitral valve replacement.  Evaluate pleural effusions.  CHEST - 2 VIEW  Comparison: 06/17/2012 and 08/25/2010 chest x-ray.  08/25/2010 chest CT.  Findings: Post mitral valve replacement.  Sequential pacemaker in place.  Bilateral pleural effusions and basilar atelectasis slightly improved on the left.  No gross pneumothorax.  Central pulmonary vascular prominence.  CT detected pulmonary nodules/granulomas not well delineated on the present plain film examination.  Gas filled top normal size small bowel loops.  Tortuous aorta.  IMPRESSION: Bilateral pleural effusions and basilar atelectasis slightly improved on the left.  Please see above.   Original Report Authenticated By: Lacy Duverney, M.D.    ASSESSMENT AND PLAN  1.  Acute on chronic diastolic CHF:  Volume stable s/p diuresis with weight reduction from 109 lbs to 94 lbs (though  some of this may r/t FTT given baseline pre-op wt of 115 lbs).  Breathing improved.  HR/BP stable. Cont current dose of PO lasix and bb.  2.  Aflutter:  Rate reasonably well controlled in 80's to 90's.  Cont current BB dose along with coumadin.  Avoiding TEE/DCCV @ this time in setting of clinical improvement with persistent frailty s/p MVR 1 month ago.  Ideally, will follow her INR weekly and provided that it remains therapeutic (subRx today) over 3-4 wks, will reconsider DCCV as an outpt.  She will need to have INR's drawn weekly @ SNF with results faxed to our coumadin clinic (Attn: Weston Brass, PharmD - fax: [phone]).  Of note, despite notes that indicate that pt converted to sinus rhythm @ some point, review of ECG's and tele fails to prove this.  She remains in atypical aflutter.  3.  CKD III:  Creat has been stable on lasix 40mg  bid.  Cont current dose of KCl supplementation.  4.  Pleural Effusions:  Appreciate TCTS input.  No need for thoracentesis @ this time.  5.  MR:  S/p bioprosthetic MVR.  Stable by echo this admission.  Signed, Nicolasa Ducking NP   History and all data above reviewed.  Patient examined.  I agree with the findings as above.  The patient exam reveals COR:RRR (flutter)  ,  Lungs: Clear  ,  Abd: Positive bowel sounds, no rebound no guarding, Ext No edema  .  All available labs, radiology testing, previous records reviewed. Agree with documented assessment and plan. We will see her within the week for transition of care and she will need labs.  INR as above.    Fayrene Fearing Leopoldo Mazzie  6:16 PM  06/24/2012

## 2012-06-24 NOTE — Discharge Summary (Signed)
Physician Discharge Summary  Zavia Pullen Calderwood NFA:[id number] DOB: Feb 24, 1926 DOA: 06/15/2012  PCP: [name], MD  Admit date: 06/15/2012 Discharge date: 06/24/2012  Recommendations for Outpatient Follow-up:  1. Please give warfarin 3 mg tonight 11/21, and follow the following schedule:  3mg  on Sunday, Tuesday, Thursday, and Saturday, and 2mg  on Monday, Wednesday, and Friday.   2.   Weekly INR for next 3-4 wks and results faxed to coumadin clinic (Attn: Weston Brass, PharmD - fax: [phone]).  3.    Please check BMP in 10 days and fax results to PCP office, Dr. Tenny Craw, for review for potassium titration if needed 4.   Continue daily weights, see instructions below 5.   Follow up with Dr. Cornelius Moras CT surgery in 2-4 weeks 6.  Follow up with cardiology at already scheduled appointment 7.  Transfer to SNF for ongoing rehabilitation   Discharge Diagnoses:  Principal Problem:  *Acute on chronic diastolic heart failure Active Problems:  COPD  Atrial fibrillation  Encounter for long-term (current) use of anticoagulants  S/P mitral valve replacement with bioprosthetic valve  S/P Maze operation for atrial fibrillation  Pacemaker-[employer]  Atrial flutter  CKD (chronic kidney disease), stage III  Pleural effusion   Discharge Condition: stable, improved  Diet recommendation: healthy heart  Wt Readings from Last 3 Encounters:  06/24/12 43.046 kg (94 lb 14.4 oz)  06/14/12 49.442 kg (109 lb)  06/11/12 49.896 kg (110 lb)    History of present illness:   Nancy Bush is an 78 y.o. female with recent bioprosthetic MVR, afib s/p MAZE, ppm, Hx of RA, asthma, presents to the ER with increase orthopnea, bilateral pedal edema, and increased pleural effusion by CXR. She saw Dr Cornelius Moras in the office, and was recommended resuming her Lasix and obtain a cardiac echo with follow up visit in a week. She presents to the ER because of her increase shortness of breath. She has no chest  pain. She has afib, with rate control, and is on anticoagulation. Evaluation in the ER included an EKG with LBBB, rate controlled afib, normal renal fx tests, with Na of 130, ProBNP of 2500 (not a significant change), INR of 3.5, with normal WBC and Hb of 10.6 grams/DL. Her CXR showed increased pleural effusions especially on the left. Hospitalist was asked to admit her for volume overload.   Hospital Course:   Acute on chronic diastolic CHF.  Cardiology and cardiothoracic surgery were consulted.   She was diuresed with lasix 40mg  IV q8h for several days and finally tapered to her home dose of lasix 40mg  po BID.  Ejection fraction was 50-55%.  She was approximately 6.5L negative per flowsheet, weight down 109lb to 94 lb during her admission and her shortness of breathin improved.  She had CXR which demonstrated pleural effusions which improved with diuresis and at this time there is no need for thoracentesis of the remaining right pleural effusion.  Continue furosemide to 40 mg PO every 12 with daily weights.    Atrial fibrillation and flutter s/p MAZE procedure.  She continued in atrial flutter with paced rhythm.  Cardiology considered DCCV at this time because of clinical improvement and persistent frailty.  Her INR was at goal initially but is sub therapeutic at the time of discharge.  She should warfarin 3mg  daily except 2mg  on M,W, and Friday with weekly INR and results faxed to the coumadin clinic (see above).    Tricuspid regurgitation.  Echocardiogram on 06/16/2012 revealed worsening tricuspid regurgitation compared to  TEE on 02/09/2012.  She should follow up with cardiology and CT surgery as an outpatient.    Failure to thrive.  Her pre-albumin was 6.5.  She was started on ensure, beneprotein, and megace.    Status post mitral valve replacement with bioprosthetic valve.  CT surgery followed.    COPD, stable.  She continued roflumilast and low dose prednisone   Normocytic anemia.  Likely  anemia of chronic disease and her hemoglobin remained stable    Procedures:  ECHO 11/14  CXR 11/12, 11/14, and 11/20  Consultations:  CT surgery  Cardiology  Discharge Exam: Filed Vitals:   06/24/12 1350  BP: 102/66  Pulse: 97  Temp: 97.6 F (36.4 C)  Resp: 18   Filed Vitals:   06/23/12 2013 06/24/12 0457 06/24/12 1218 06/24/12 1350  BP: 111/72 96/63 109/64 102/66  Pulse: 106 89 111 97  Temp: 98 F (36.7 C) 97.9 F (36.6 C)  97.6 F (36.4 C)  TempSrc: Oral Oral  Oral  Resp: 16 18  18   Height:      Weight:  43.046 kg (94 lb 14.4 oz)    SpO2: 100% 100%  100%    General: CF, no acute distress  HEENT: MMM  Cardiovascular: RRR, no rubs, no gallops  Respiratory:  Diminished at bilateral bases Abdomen: Soft/+BS, non tender, non distended, no guarding  Extremities: No LEE   Discharge Instructions      Discharge Orders    Future Appointments: Provider: Department: Dept Phone: Center:   07/05/2012 8:50 AM Beatrice Lecher, PA [facility] Main Office [place]) [phone] [facility]   09/09/2012 2:15 PM Marinus Maw, MD [facility] Main Office [place]) [phone] [facility]     Future Orders Please Complete By Expires   Diet - low sodium heart healthy      Increase activity slowly      Discharge instructions      Comments:   Ms. Biffle was hospitalized with shortness of breath and was found to have acute on chronic heart failure. She was given lasix and her breathing improved.  She should continue rehabilitation.  Her INR was mildly elevated and her coumadin dose was adjusted.  She should follow a new warfarin schedule and have weekly INR until her INR is stable in the correct range.   Call MD for:  temperature >100.4      Call MD for:  persistant nausea and vomiting      Call MD for:  severe uncontrolled pain      Call MD for:  difficulty breathing, headache or visual disturbances      Call MD for:  hives      Call MD for:  persistant  dizziness or light-headedness      Call MD for:  extreme fatigue      (HEART FAILURE PATIENTS) Call MD:  Anytime you have any of the following symptoms: 1) 3 pound weight gain in 24 hours or 5 pounds in 1 week 2) shortness of breath, with or without a dry hacking cough 3) swelling in the hands, feet or stomach 4) if you have to sleep on extra pillows at night in order to breathe.      Call MD for:      Comments:   Please call 911 if you have chest pain with shortness of breath, slurred speech, weakness of an arm or leg, facial droop, or confusion.       Medication List     As of 06/24/2012  4:26 PM    TAKE these medications         acetaminophen 500 MG tablet   Commonly known as: TYLENOL   Take 1 tablet (500 mg total) by mouth every 6 (six) hours as needed.      aspirin 81 MG EC tablet   Take 1 tablet (81 mg total) by mouth daily.      CALTRATE 600+D 600-400 MG-UNIT per tablet   Generic drug: Calcium Carbonate-Vitamin D   Take 1 tablet by mouth 2 (two) times daily.      docusate sodium 100 MG capsule   Commonly known as: COLACE   Take 100 mg by mouth 2 (two) times daily.      feeding supplement Liqd   Take 237 mLs by mouth 3 (three) times daily with meals.      folic acid 1 MG tablet   Commonly known as: FOLVITE   Take 2 mg by mouth daily.      furosemide 40 MG tablet   Commonly known as: LASIX   Take 40 mg by mouth 2 (two) times daily.      levalbuterol 45 MCG/ACT inhaler   Commonly known as: XOPENEX HFA   Inhale 2 puffs into the lungs every 6 (six) hours as needed. For wheezing      loratadine 10 MG tablet   Commonly known as: CLARITIN   Take 1 tablet (10 mg total) by mouth daily.      magnesium hydroxide 400 MG/5ML suspension   Commonly known as: MILK OF MAGNESIA   Take 30 mLs by mouth daily as needed for constipation. For constipation      megestrol 400 MG/10ML suspension   Commonly known as: MEGACE   Take 10 mLs (400 mg total) by mouth daily.       metoprolol tartrate 25 MG tablet   Commonly known as: LOPRESSOR   Take 75 mg by mouth 2 (two) times daily.      nitroGLYCERIN 0.4 MG SL tablet   Commonly known as: NITROSTAT   Place 1 tablet (0.4 mg total) under the tongue every 5 (five) minutes as needed for chest pain.      ondansetron 4 MG tablet   Commonly known as: ZOFRAN   Take 4 mg by mouth every 6 (six) hours as needed. For nausea      potassium chloride SA 20 MEQ tablet   Commonly known as: K-DUR,KLOR-CON   Take 2 tablets (40 mEq total) by mouth daily.      predniSONE 2.5 MG tablet   Commonly known as: DELTASONE   Take 2.5 mg by mouth every other day.      protein supplement Powd   Take 6 g by mouth 3 (three) times daily with meals.      roflumilast 500 MCG Tabs tablet   Commonly known as: DALIRESP   Take 500 mcg by mouth daily.      traMADol 50 MG tablet   Commonly known as: ULTRAM   Take 50 mg by mouth every 4 (four) hours as needed. For pain      warfarin 1 MG tablet   Commonly known as: COUMADIN   Take 2-3 tablets (2-3 mg total) by mouth daily. Takes 3mg  daily except 2mg  on Monday, Wednesday, and Friday         Follow-up Information    Follow up with Tereso Newcomer, PA. On 07/05/2012. (8:50 AM)    Contact information:   [address]  [place] [address] [phone]       Follow up with Weekly INR's to be performed @ SNF starting 11/22.. (Please Fax INR results to Weston Brass, PharmD [phone])       Follow up with Purcell Nails, MD. Schedule an appointment as soon as possible for a visit in 4 weeks.   Contact information:   [address] [employer] [place] [address] [phone]       Follow up with [name], MD. (As needed.  please fax BMP results to Dr. Tenny Craw for review)    Contact information:   [address] [place] [address] [phone]           The results of significant diagnostics from this hospitalization (including imaging,  microbiology, ancillary and laboratory) are listed below for reference.    Significant Diagnostic Studies: Dg Chest 2 View  06/23/2012  *RADIOLOGY REPORT*  Clinical Data: Post mitral valve replacement.  Evaluate pleural effusions.  CHEST - 2 VIEW  Comparison: 06/17/2012 and 08/25/2010 chest x-ray.  08/25/2010 chest CT.  Findings: Post mitral valve replacement.  Sequential pacemaker in place.  Bilateral pleural effusions and basilar atelectasis slightly improved on the left.  No gross pneumothorax.  Central pulmonary vascular prominence.  CT detected pulmonary nodules/granulomas not well delineated on the present plain film examination.  Gas filled top normal size small bowel loops.  Tortuous aorta.  IMPRESSION: Bilateral pleural effusions and basilar atelectasis slightly improved on the left.  Please see above.   Original Report Authenticated By: Lacy Duverney, M.D.    Dg Chest 2 View  06/17/2012  *RADIOLOGY REPORT*  Clinical Data: Follow up effusions.  CHF.  CHEST - 2 VIEW  Comparison: 06/15/2012  Findings: Left chest wall pacer device is identified with leads in the right atrial appendage and right ventricle.  Heart size is normal.  Similar appearance of moderate bilateral pleural effusions with overlying atelectasis.  No interstitial edema.  No airspace consolidation.  IMPRESSION:  1.  No change in bilateral pleural effusions.   Original Report Authenticated By: [name], M.D.    Dg Chest 2 View  06/15/2012  *RADIOLOGY REPORT*  Clinical Data: Left chest pain, weakness  CHEST - 2 VIEW  Comparison: Chest x-ray of 06/14/2012  Findings: There has been some increase in opacities at the lung bases most consistent with slight increase in effusions and basilar atelectasis.  Mild cardiomegaly is stable.  A dual lead permanent pacemaker remains.  The bones are osteopenic.  IMPRESSION: Some increase in volume of small effusions and basilar atelectasis bilaterally.   Original Report Authenticated By: [name], M.D.    Dg Chest 2 View  06/14/2012  *RADIOLOGY REPORT*  Clinical Data: Post MVR October 2013, shortness of breath  CHEST - 2 VIEW  Comparison: 05/27/2012  Findings: Left subclavian sequential transvenous pacemaker leads project at right atrium and right ventricle. Enlargement of cardiac silhouette post MVR. Mediastinal contours normal. Minimal pulmonary vascular congestion. Bibasilar effusions and atelectasis, increased on left. No pulmonary infiltrate or pneumothorax. Surgical clips right upper quadrant question cholecystectomy. Additional surgical clips right axilla. Bones appear demineralized. Question tiny left renal calculus.  IMPRESSION: Bibasilar effusions atelectasis, increased on left since previous study. Mild enlargement of cardiac silhouette minimal pulmonary vascular congestion post MVR and pacemaker.   Original Report Authenticated By: [name], M.D.    Dg Chest 2 View  05/27/2012  *RADIOLOGY REPORT*  Clinical Data: Weakness.  Pleural effusion.  CHEST - 2 VIEW  Comparison:  PA and lateral chest 05/24/2012.  Findings: Small bilateral pleural effusions and basilar atelectasis persist.  The patient's right pleural effusion appears decreased in size. Small right apical pneumothorax, less than 5%, has decreased in size.  Associated basilar atelectasis is noted.  There is cardiomegaly without edema.  IMPRESSION:  1.  Some decrease in a small right pleural effusion.  Tiny right apical pneumothorax is also decreased in size. 2.  No change in a very small left pleural effusion.   Original Report Authenticated By: [name]. [name], M.D.    Ct Head Wo Contrast  05/26/2012  *RADIOLOGY REPORT*  Clinical Data: Change in mental status.   Pt was ambulatory and talking yesterday. Not responding today.  CT HEAD WITHOUT CONTRAST  Technique:  Contiguous axial images were obtained from the base of the skull through the vertex without contrast.  Comparison: None.  Findings: The brain stem,  cerebellum, cerebral peduncles, thalami, basal ganglia, basilar cisterns, and ventricular system appear unremarkable.  Minimal periventricular white matter hypodensity suggest mild chronic ischemic microvascular white matter disease.  No intracranial hemorrhage, mass lesion, or acute infarction is identified.  Incidental failure of fusion of the posterior arch of C1 noted.  IMPRESSION:  1.  No acute intracranial findings. 2.  Mild chronic ischemic microvascular white matter disease.   Original Report Authenticated By: [name], M.D.    Dg Chest Bilateral Decubitus  06/17/2012  *RADIOLOGY REPORT*  Clinical Data: Bilateral pleural effusions.  CHEST - BILATERAL DECUBITUS VIEW 6:05 p.m.  Comparison: 06/17/2012 at 5:56 p.m.  Findings: The left pleural effusion layers laterally in the decubitus position with a small amount loculated at the base.  There is only a tiny right effusion with a tiny amount of layering laterally at the base but not extending superiorly.  There is also chronic blunting of the costophrenic angles bilaterally.  IMPRESSION: Partial layering of the bilateral pleural effusions laterally.   Original Report Authenticated By: Francene Boyers, M.D.     Microbiology: Recent Results (from the past 240 hour(s))  MRSA PCR SCREENING     Status: Normal   Collection Time   06/15/12 10:29 PM      Component Value Range Status Comment   MRSA by PCR NEGATIVE  NEGATIVE Final   CLOSTRIDIUM DIFFICILE BY PCR     Status: Normal   Collection Time   06/20/12  2:33 PM      Component Value Range Status Comment   C difficile by pcr NEGATIVE  NEGATIVE Final      Labs: Basic Metabolic Panel:  Lab 06/24/12 1610 06/23/12 0410 06/21/12 0630 06/20/12 0650 06/19/12 0511  NA 137 134* 137 138 138  K 3.9 3.9 3.8 3.5 3.8  CL 95* 93* 94* 93* 93*  CO2 33* 35* 38* 40* 40*  GLUCOSE 98 104* 108* 112* 138*  BUN 11 12 11 10 10   CREATININE 0.68 0.67 0.72 0.78 0.82  CALCIUM 9.1 8.9 9.2 9.0 9.5  MG -- --  -- -- --  PHOS -- -- -- -- --   Liver Function Tests: No results found for this basename: AST:5,ALT:5,ALKPHOS:5,BILITOT:5,PROT:5,ALBUMIN:5 in the last 168 hours No results found for this basename: LIPASE:5,AMYLASE:5 in the last 168 hours No results found for this basename: AMMONIA:5 in the last 168 hours CBC:  Lab 06/20/12 0650 06/18/12 0430  WBC 9.3 10.9*  NEUTROABS -- --  HGB 10.3* 10.2*  HCT 34.0* 33.8*  MCV 92.4 92.3  PLT 192 207   Cardiac Enzymes:  Lab 06/17/12  2016  CKTOTAL --  CKMB --  CKMBINDEX --  TROPONINI <0.30   BNP: BNP (last 3 results)  Basename 06/21/12 0915 06/15/12 1714 06/14/12 1400  PROBNP 1745.0* 2390.0* 2527.00*   CBG: No results found for this basename: GLUCAP:5 in the last 168 hours  Time coordinating discharge: 45 minutes  Signed:  Melyssa Signor  [facility] 06/24/2012, 4:26 PM

## 2012-06-24 NOTE — Progress Notes (Signed)
ANTICOAGULATION CONSULT NOTE - Follow Up Consult  Pharmacy Consult for Coumadin Indication: atrial fibrillation  Assessment: 78 yo female with AFib and bioprosthetic MVR being continued on coumadin. Prior to arrival dosing at nursing facility was 3mg  daily except 1mg  on Fridays with supra-therapuetic INR (3.54) on admission to hospital.   INR suttherapeutic today at  1.83 - this probably reflects the 4 doses of 1 mg coumadin given 11/6 - 11/19. CBC pm 11/17 stable, no bleeding issues noted in chart.   Goal of Therapy:  INR 2-3  Plan:  1.  Coumadin 2 mg daily 2.  Follow-up INR in AM  Herby Abraham, Pharm.D. [phone] 06/24/2012 11:26 AM    ------- Allergies  Allergen Reactions  . Promethazine Hcl Other (See Comments)    unknown  . Sulfonamide Derivatives Other (See Comments)    unknown  . Tequin Other (See Comments)    hallucinations     Patient Measurements: Height: 5' (152.4 cm) Weight: 94 lb 14.4 oz (43.046 kg) IBW/kg (Calculated) : 45.5     Vital Signs: Temp: 97.9 F (36.6 C) (11/21 0457) Temp src: Oral (11/21 0457) BP: 96/63 mmHg (11/21 0457) Pulse Rate: 89  (11/21 0457)  Labs:  Basename 06/24/12 0500 06/23/12 0410 06/22/12 0500  HGB -- -- --  HCT -- -- --  PLT -- -- --  APTT -- -- --  LABPROT 20.5* 23.0* 26.6*  INR 1.83* 2.14* 2.60*  HEPARINUNFRC -- -- --  CREATININE 0.68 0.67 --  CKTOTAL -- -- --  CKMB -- -- --  TROPONINI -- -- --    Estimated Creatinine Clearance: 35.5 ml/min (by C-G formula based on Cr of 0.68).

## 2012-06-25 DIAGNOSIS — I5040 Unspecified combined systolic (congestive) and diastolic (congestive) heart failure: Secondary | ICD-10-CM | POA: Diagnosis not present

## 2012-06-25 DIAGNOSIS — E46 Unspecified protein-calorie malnutrition: Secondary | ICD-10-CM | POA: Diagnosis not present

## 2012-06-25 DIAGNOSIS — I442 Atrioventricular block, complete: Secondary | ICD-10-CM | POA: Diagnosis not present

## 2012-06-25 DIAGNOSIS — I4891 Unspecified atrial fibrillation: Secondary | ICD-10-CM | POA: Diagnosis not present

## 2012-06-25 NOTE — Telephone Encounter (Signed)
SNF bed  at [facility],

## 2012-06-25 NOTE — Telephone Encounter (Signed)
Unable to reach pt or leave a message  

## 2012-06-25 NOTE — Telephone Encounter (Signed)
Called [facility] and spoke with Nancy Bush/ nurse. States she is weak, admitting vitals bp 110/50 p 102. He states she c/o of feeling weak, but otherwise ok. I told him to call the office with any questions or concerns, he agreed to plan.

## 2012-06-25 NOTE — Progress Notes (Signed)
Late Entry: Assessment unchanged. Discussed discharge instructions with pt and family. Verbalized understanding. Instructions placed in packet to be sent to SNF. D/C'd tele and IV removed. Pt left via EMS.

## 2012-06-28 ENCOUNTER — Ambulatory Visit: Payer: Medicare Other | Admitting: Cardiology

## 2012-06-28 DIAGNOSIS — R627 Adult failure to thrive: Secondary | ICD-10-CM | POA: Diagnosis not present

## 2012-06-28 DIAGNOSIS — I2510 Atherosclerotic heart disease of native coronary artery without angina pectoris: Secondary | ICD-10-CM | POA: Diagnosis not present

## 2012-06-28 DIAGNOSIS — I509 Heart failure, unspecified: Secondary | ICD-10-CM | POA: Diagnosis not present

## 2012-06-28 DIAGNOSIS — J90 Pleural effusion, not elsewhere classified: Secondary | ICD-10-CM | POA: Diagnosis not present

## 2012-06-29 ENCOUNTER — Encounter: Payer: Self-pay | Admitting: Internal Medicine

## 2012-06-29 ENCOUNTER — Telehealth: Payer: Self-pay | Admitting: Internal Medicine

## 2012-06-29 NOTE — Telephone Encounter (Signed)
Per note from Trish at hospital pt needs the transition of care call before the appt

## 2012-06-29 NOTE — Telephone Encounter (Signed)
Spoke to patient's nurse Iris at [facility] she stated patient is doing okay, complains of nausea.States she is taking medication as prescribed.Advised to keep post hospital appointment with Tereso Newcomer PA 07/05/12.

## 2012-06-30 ENCOUNTER — Encounter: Payer: Self-pay | Admitting: Internal Medicine

## 2012-06-30 DIAGNOSIS — H9390 Unspecified disorder of ear, unspecified ear: Secondary | ICD-10-CM | POA: Diagnosis not present

## 2012-06-30 DIAGNOSIS — J398 Other specified diseases of upper respiratory tract: Secondary | ICD-10-CM | POA: Diagnosis not present

## 2012-07-05 ENCOUNTER — Ambulatory Visit (INDEPENDENT_AMBULATORY_CARE_PROVIDER_SITE_OTHER): Payer: Medicare Other | Admitting: Physician Assistant

## 2012-07-05 ENCOUNTER — Encounter: Payer: Self-pay | Admitting: Physician Assistant

## 2012-07-05 ENCOUNTER — Other Ambulatory Visit: Payer: Self-pay | Admitting: Physician Assistant

## 2012-07-05 ENCOUNTER — Ambulatory Visit (INDEPENDENT_AMBULATORY_CARE_PROVIDER_SITE_OTHER): Payer: Medicare Other | Admitting: *Deleted

## 2012-07-05 VITALS — BP 112/60 | HR 103 | Ht 60.0 in | Wt 93.0 lb

## 2012-07-05 DIAGNOSIS — I4892 Unspecified atrial flutter: Secondary | ICD-10-CM

## 2012-07-05 DIAGNOSIS — R109 Unspecified abdominal pain: Secondary | ICD-10-CM

## 2012-07-05 DIAGNOSIS — I4891 Unspecified atrial fibrillation: Secondary | ICD-10-CM

## 2012-07-05 DIAGNOSIS — I5033 Acute on chronic diastolic (congestive) heart failure: Secondary | ICD-10-CM

## 2012-07-05 DIAGNOSIS — I5032 Chronic diastolic (congestive) heart failure: Secondary | ICD-10-CM

## 2012-07-05 DIAGNOSIS — R0989 Other specified symptoms and signs involving the circulatory and respiratory systems: Secondary | ICD-10-CM

## 2012-07-05 LAB — PACEMAKER DEVICE OBSERVATION
AL IMPEDENCE PM: 547 Ohm
ATRIAL PACING PM: 1
VENTRICULAR PACING PM: 34

## 2012-07-05 LAB — BASIC METABOLIC PANEL
BUN: 15 mg/dL (ref 6–23)
CO2: 32 mEq/L (ref 19–32)
Calcium: 9.5 mg/dL (ref 8.4–10.5)
Creatinine, Ser: 0.7 mg/dL (ref 0.4–1.2)
GFR: 82.01 mL/min (ref >60.00)
Glucose, Bld: 115 mg/dL — ABNORMAL HIGH (ref 70–99)
Sodium: 133 mEq/L — ABNORMAL LOW (ref 135–145)

## 2012-07-05 NOTE — Progress Notes (Signed)
[address], [address] [place], [place]  [address] Phone: [phone], Fax:  [phone]  Date:  07/05/2012   Name:  KRYSTOL ROCCO   DOB:  [birth year]   MRN:  [id number]  PCP:  [name], MD  Primary Cardiologist:  Dr. Rollene Rotunda  Primary Electrophysiologist:  Dr. Lewayne Bunting    History of Present Illness: Nancy Bush is a 78 y.o. female who returns for follow up after a recent admission to the hospital for a/c diastolic CHF.  She has a hx of diastolic CHF, mitral valve prolapse with mitral regurgitation, rheumatoid arthritis, pulmonary hypertension and persistent atrial fibrillation. LHC 10/13: LAD 30%, mid RCA 30-40%, EF 65-70%, severe mitral regurgitation, MAC, patent renal arteries bilaterally, abdominal aorta without stenosis or dilatation. She underwent minimally invasive MVR with a bioprosthetic mitral valve and MAZE procedure with Dr. Tressie Stalker in 10/13. Her postoperative course was complicated by significant bradycardia and she underwent permanent pacemaker implantation with Dr. Ladona Ridgel.   She was admitted 11/12-11/21. She presented with progressively worsening volume overload. Echo 06/15/12: EF 50-55%, MVR ok, moderate RAE, severe TR, PASP 34. She was diuresed with IV Lasix (approximately -6.5 L). D/C weight was 94 lbs. She did have pleural effusions on CXR which improved with diuresis and she did not require thoracentesis. Patient was noted to be in atrial flutter throughout her hospitalization. INR was somewhat labile. DCCV was considered. However, the patient clinically improved and this was deferred. DCCV could be considered electively as an outpatient.   She is staying at [facility]. She notes that she feels tired and weak. There has been no change in her breathing. She denies chest pain or syncope. She denies orthopnea, PND or edema. She does work with physical therapy. She probably has class IIb-III symptoms. She has noted abdominal  pain recently. She states that she cannot stand her pants fitting tighter or anything else on her abdomen.  No fevers. She is passing flatus. She denies vomiting. She has fairly normal bowel movements. Her appetite is somewhat poor.  Labs (11/13):   K 3.9, creatinine 0.68, ALT 11 , Hgb 10.3, TSH 1.35 INR (06/25/12):  3.5 INR (06/29/12):  2.3  Wt Readings from Last 3 Encounters:  07/05/12 93 lb (42.185 kg)  06/24/12 94 lb 14.4 oz (43.046 kg)  06/14/12 109 lb (49.442 kg)     Past Medical History  Diagnosis Date  . MVP (mitral valve prolapse)   . Pulmonary hypertension     MILD TO MODERATE BY ECHO  . History of chest pain   . Mitral regurgitation     MILD  . Asthma   . Rheumatoid arthritis   . Shortness of breath   . H/O: hysterectomy   . LBBB (left bundle branch block)     per discharge note 2003  . PVC (premature ventricular contraction)   . Atrial fibrillation   . Hypertension   . Heart murmur   . S/P mitral valve replacement with bioprosthetic valve 05/18/2012    27mm Edwards Magna Mitral pericardial bioprosthesis   . S/P Maze operation for atrial fibrillation 05/18/2012    Complete biatrial lesion set using cryothermy with oversewing of LA appendage  . COPD (chronic obstructive pulmonary disease)   . CHF (congestive heart failure)   . Pacemaker     Current Outpatient Prescriptions  Medication Sig Dispense Refill  . acetaminophen (TYLENOL) 500 MG tablet Take 1 tablet (500 mg total) by mouth every 6 (six) hours as needed.  30 tablet    . aspirin EC 81 MG EC tablet Take 1 tablet (81 mg total) by mouth daily.      . Calcium Carbonate-Vitamin D (CALTRATE 600+D) 600-400 MG-UNIT per tablet Take 1 tablet by mouth 2 (two) times daily.      Marland Kitchen docusate sodium (COLACE) 100 MG capsule Take 100 mg by mouth 2 (two) times daily.      . feeding supplement (ENSURE COMPLETE) LIQD Take 237 mLs by mouth 3 (three) times daily with meals.      . folic acid (FOLVITE) 1 MG tablet Take 2 mg by  mouth daily.       . furosemide (LASIX) 40 MG tablet Take 40 mg by mouth 2 (two) times daily.      Marland Kitchen levalbuterol (XOPENEX HFA) 45 MCG/ACT inhaler Inhale 2 puffs into the lungs every 6 (six) hours as needed. For wheezing      . loratadine (CLARITIN) 10 MG tablet Take 1 tablet (10 mg total) by mouth daily.      . magnesium hydroxide (MILK OF MAGNESIA) 400 MG/5ML suspension Take 30 mLs by mouth daily as needed for constipation. For constipation  360 mL    . megestrol (MEGACE) 400 MG/10ML suspension Take 10 mLs (400 mg total) by mouth daily.  240 mL    . metoprolol tartrate (LOPRESSOR) 25 MG tablet Take 75 mg by mouth 2 (two) times daily.      . nitroGLYCERIN (NITROSTAT) 0.4 MG SL tablet Place 1 tablet (0.4 mg total) under the tongue every 5 (five) minutes as needed for chest pain.      Marland Kitchen ondansetron (ZOFRAN) 4 MG tablet Take 4 mg by mouth every 6 (six) hours as needed. For nausea      . potassium chloride SA (K-DUR,KLOR-CON) 20 MEQ tablet Take 2 tablets (40 mEq total) by mouth daily.      . predniSONE (DELTASONE) 2.5 MG tablet Take 2.5 mg by mouth every other day.      . protein supplement (RESOURCE BENEPROTEIN) POWD Take 6 g by mouth 3 (three) times daily with meals.      . roflumilast (DALIRESP) 500 MCG TABS tablet Take 500 mcg by mouth daily.      . traMADol (ULTRAM) 50 MG tablet Take 50 mg by mouth every 4 (four) hours as needed. For pain      . warfarin (COUMADIN) 1 MG tablet Take 2-3 tablets (2-3 mg total) by mouth daily. Takes 3mg  daily except 2mg  on Monday, Wednesday, and Friday        Allergies: Allergies  Allergen Reactions  . Promethazine Hcl Other (See Comments)    unknown  . Sulfonamide Derivatives Other (See Comments)    unknown  . Tequin Other (See Comments)    hallucinations     Social History:  The patient  reports that she has never smoked. She does not have any smokeless tobacco history on file. She reports that she does not drink alcohol or use illicit drugs.   ROS:   Please see the history of present illness.    All other systems reviewed and negative.   PHYSICAL EXAM: VS:  BP 112/60  Pulse 103  Ht 5' (1.524 m)  Wt 93 lb (42.185 kg)  BMI 18.16 kg/m2 Chronically ill-appearing female in no acute distress HEENT: normal Neck: no JVD at 90 Cardiac:  normal S1, S2; RRR; no murmur Lungs:  Decreased breath sounds bilaterally, no wheezing, rhonchi or rales Abd: Soft, no hepatomegaly, diffuse tenderness  noted with palpation Ext: no edema Skin: warm and dry Neuro:  CNs 2-12 intact, no focal abnormalities noted  EKG:  V. paced, HR 103  Pacemaker Interrogation:   Interrogation of her device today demonstrates underlying sinus rhythm. She did have one episode of nonsustained ventricular tachycardia of approximately 10 beats.      ASSESSMENT AND PLAN:  1. Chronic Diastolic CHF:   Overall, her volume appears stable. Check a follow up basic metabolic panel today. Continue current dose of Lasix for now.  2. Atrial Flutter:   As noted, interrogation of her device does demonstrate sinus rhythm. I reviewed this with Dr. Antoine Poche as well. At this point, would recommend continuing her Coumadin. We will continue to check it weekly for now should she go back into atrial flutter. Her heart rate is somewhat elevated. However, we will not adjust her rate controlling therapy. At this point, we will continue her current dose of metoprolol.  3. Abdominal Pain:   Etiology unclear. Arrange abdominal series. If this is negative, followup with PCP.  4. Status Post Mitral Valve Replacement:   Followup with Dr. Cornelius Moras as directed.  5. Bradycardia, s/p Pacer:  Follow up with Dr. Lewayne Bunting as directed.  6. Deconditioning:   Continue PT.  She has been encouraged to continue to increase protein in her diet.    7. Disposition:   Followup with Dr. Antoine Poche in one month.  Luna Glasgow, PA-C  8:54 AM 07/05/2012

## 2012-07-05 NOTE — Progress Notes (Signed)
Pacer interrogation only Pt seeing [employer]

## 2012-07-05 NOTE — Patient Instructions (Addendum)
SCOTT WEAVER, PAC WILL BE FAXING OVER HIS OFFICE NOTE FROM TODAY WITH ORDERS,  PLEASE MAKE SURE TO FAX COPY OF WEEKLY INR'S TO [place], [place] [phone]   Your physician recommends that you return for lab work in: TOADY BMET  YOU NEED TO SCHEDULE AN ACUTE ABDOMINAL SERIES XRAY TO BE DONE @ [facility] TO BE DONE WITHIN THE NEXT FEW DAYS  PLEASE FOLLOW UP WITH DR. HOCHREIN IN THE NEXT 3-4 WEEKS IF NOT AVAILABLE THEN SCHEDULE WITH SCOTT WEAVER, PAC SAME DAY DR. Antoine Poche IS IN THE OFFICE

## 2012-07-06 ENCOUNTER — Telehealth: Payer: Self-pay | Admitting: *Deleted

## 2012-07-06 ENCOUNTER — Encounter: Payer: Self-pay | Admitting: Physician Assistant

## 2012-07-06 DIAGNOSIS — R109 Unspecified abdominal pain: Secondary | ICD-10-CM | POA: Diagnosis not present

## 2012-07-06 NOTE — Telephone Encounter (Signed)
Message copied by Tarri Fuller on Tue Jul 06, 2012  9:56 AM ------      Message from: [place], [place] T      Created: Mon Jul 05, 2012  5:29 PM       Potassium and kidney function look good.      Continue with current treatment plan.      Tereso Newcomer, PA-C  4:49 PM 06/17/2012

## 2012-07-06 NOTE — Telephone Encounter (Signed)
s/w NHRN today about lab results. I will fax results to [facility] today [phone]

## 2012-07-07 ENCOUNTER — Ambulatory Visit: Payer: Medicare Other

## 2012-07-07 ENCOUNTER — Telehealth: Payer: Self-pay | Admitting: Physician Assistant

## 2012-07-07 DIAGNOSIS — J449 Chronic obstructive pulmonary disease, unspecified: Secondary | ICD-10-CM | POA: Diagnosis not present

## 2012-07-07 DIAGNOSIS — D649 Anemia, unspecified: Secondary | ICD-10-CM | POA: Diagnosis not present

## 2012-07-07 DIAGNOSIS — I509 Heart failure, unspecified: Secondary | ICD-10-CM | POA: Diagnosis not present

## 2012-07-07 DIAGNOSIS — R109 Unspecified abdominal pain: Secondary | ICD-10-CM

## 2012-07-07 DIAGNOSIS — R627 Adult failure to thrive: Secondary | ICD-10-CM | POA: Diagnosis not present

## 2012-07-07 DIAGNOSIS — R5381 Other malaise: Secondary | ICD-10-CM

## 2012-07-07 DIAGNOSIS — I495 Sick sinus syndrome: Secondary | ICD-10-CM | POA: Diagnosis not present

## 2012-07-07 DIAGNOSIS — N189 Chronic kidney disease, unspecified: Secondary | ICD-10-CM

## 2012-07-07 DIAGNOSIS — I071 Rheumatic tricuspid insufficiency: Secondary | ICD-10-CM

## 2012-07-07 LAB — HEPATIC FUNCTION PANEL
Bilirubin, Direct: 0.2 mg/dL (ref 0.0–0.3)
Total Bilirubin: 0.4 mg/dL (ref 0.3–1.2)

## 2012-07-07 NOTE — Telephone Encounter (Signed)
Order placed for Abdominal US complete dx abd pain, severe tricuspid regurg and to r/o ascites or hepatic congestion; I will forward this to [facility] for scheduling to be done @ [facility] with [facility] calling [facility] with date and time. 

## 2012-07-07 NOTE — Telephone Encounter (Signed)
Order placed for Abdominal US complete dx abd pain, severe tricuspid regurg and to r/o ascites or hepatic congestion; I will forward this to [facility] for scheduling to be done @ [facility] with [facility] calling [facility] with date and time.

## 2012-07-07 NOTE — Telephone Encounter (Signed)
Nancy Bush  Please arrange abdominal ultrasound (complete). Dx:  Abdominal pain; severe tricuspid regurgitation;  Rule out ascites and hepatic congestion. Also get LFTs (can we add on to blood from 12/2?). Thanks [place], PA-C  1:34 PM 07/07/2012

## 2012-07-08 ENCOUNTER — Telehealth: Payer: Self-pay | Admitting: *Deleted

## 2012-07-08 DIAGNOSIS — F4321 Adjustment disorder with depressed mood: Secondary | ICD-10-CM | POA: Diagnosis not present

## 2012-07-08 NOTE — Telephone Encounter (Signed)
Message copied by Tarri Fuller on Thu Jul 08, 2012 12:11 PM ------      Message from: [place], [place] T      Created: Wed Jul 07, 2012  4:32 PM       Normal LFTs.      Albumin low.  Patient to increase protein in diet, use Ensure, etc.      Tereso Newcomer, PA-C  4:32 PM 07/07/2012

## 2012-07-08 NOTE — Telephone Encounter (Signed)
s/w NHRN Minitra who has been notiifed about lab results. I will fax results to [facility] today [phone]

## 2012-07-09 ENCOUNTER — Encounter: Payer: Self-pay | Admitting: Physician Assistant

## 2012-07-09 LAB — PROTIME-INR

## 2012-07-12 ENCOUNTER — Telehealth: Payer: Self-pay | Admitting: *Deleted

## 2012-07-12 ENCOUNTER — Ambulatory Visit (HOSPITAL_COMMUNITY)
Admission: RE | Admit: 2012-07-12 | Discharge: 2012-07-12 | Disposition: A | Discharge status: Home / Self Care | Payer: Medicare Other | Source: Ambulatory Visit | Attending: Physician Assistant | Admitting: Physician Assistant

## 2012-07-12 DIAGNOSIS — Z9889 Other specified postprocedural states: Secondary | ICD-10-CM | POA: Insufficient documentation

## 2012-07-12 DIAGNOSIS — Z9089 Acquired absence of other organs: Secondary | ICD-10-CM | POA: Diagnosis not present

## 2012-07-12 DIAGNOSIS — R109 Unspecified abdominal pain: Secondary | ICD-10-CM | POA: Insufficient documentation

## 2012-07-12 DIAGNOSIS — I079 Rheumatic tricuspid valve disease, unspecified: Secondary | ICD-10-CM | POA: Insufficient documentation

## 2012-07-12 DIAGNOSIS — I071 Rheumatic tricuspid insufficiency: Secondary | ICD-10-CM

## 2012-07-12 NOTE — Telephone Encounter (Signed)
s/w NHRN about abdominal u/s results unremarkable. I will fax  results over to [facility] tonight [phone]

## 2012-07-12 NOTE — Telephone Encounter (Signed)
Message copied by Tarri Fuller on Mon Jul 12, 2012  6:11 PM ------      Message from: [place], [place] T      Created: Mon Jul 12, 2012  1:47 PM       Unremarkable abdominal US.      No findings of extra fluid to explain abdominal pain.      Continue with current treatment plan.      Tereso Newcomer, PA-C  1:46 PM 07/12/2012

## 2012-07-14 DIAGNOSIS — H9390 Unspecified disorder of ear, unspecified ear: Secondary | ICD-10-CM | POA: Diagnosis not present

## 2012-07-14 DIAGNOSIS — J398 Other specified diseases of upper respiratory tract: Secondary | ICD-10-CM | POA: Diagnosis not present

## 2012-07-24 DIAGNOSIS — R109 Unspecified abdominal pain: Secondary | ICD-10-CM | POA: Diagnosis not present

## 2012-07-24 DIAGNOSIS — I5033 Acute on chronic diastolic (congestive) heart failure: Secondary | ICD-10-CM | POA: Diagnosis not present

## 2012-07-24 DIAGNOSIS — I4892 Unspecified atrial flutter: Secondary | ICD-10-CM

## 2012-07-24 DIAGNOSIS — I5032 Chronic diastolic (congestive) heart failure: Secondary | ICD-10-CM

## 2012-08-04 DIAGNOSIS — D649 Anemia, unspecified: Secondary | ICD-10-CM | POA: Diagnosis not present

## 2012-08-04 DIAGNOSIS — M6281 Muscle weakness (generalized): Secondary | ICD-10-CM | POA: Diagnosis not present

## 2012-08-04 DIAGNOSIS — Z954 Presence of other heart-valve replacement: Secondary | ICD-10-CM | POA: Diagnosis not present

## 2012-08-04 DIAGNOSIS — I10 Essential (primary) hypertension: Secondary | ICD-10-CM | POA: Diagnosis not present

## 2012-08-04 DIAGNOSIS — R0602 Shortness of breath: Secondary | ICD-10-CM | POA: Diagnosis not present

## 2012-08-04 DIAGNOSIS — J398 Other specified diseases of upper respiratory tract: Secondary | ICD-10-CM | POA: Diagnosis not present

## 2012-08-04 DIAGNOSIS — I509 Heart failure, unspecified: Secondary | ICD-10-CM | POA: Diagnosis not present

## 2012-08-04 DIAGNOSIS — I4891 Unspecified atrial fibrillation: Secondary | ICD-10-CM | POA: Diagnosis not present

## 2012-08-04 DIAGNOSIS — E039 Hypothyroidism, unspecified: Secondary | ICD-10-CM | POA: Diagnosis not present

## 2012-08-04 DIAGNOSIS — I270 Primary pulmonary hypertension: Secondary | ICD-10-CM | POA: Diagnosis not present

## 2012-08-04 DIAGNOSIS — J449 Chronic obstructive pulmonary disease, unspecified: Secondary | ICD-10-CM | POA: Diagnosis not present

## 2012-08-04 DIAGNOSIS — R279 Unspecified lack of coordination: Secondary | ICD-10-CM | POA: Diagnosis not present

## 2012-08-04 DIAGNOSIS — R5381 Other malaise: Secondary | ICD-10-CM | POA: Diagnosis not present

## 2012-08-04 DIAGNOSIS — H9390 Unspecified disorder of ear, unspecified ear: Secondary | ICD-10-CM | POA: Diagnosis not present

## 2012-08-04 DIAGNOSIS — N185 Chronic kidney disease, stage 5: Secondary | ICD-10-CM | POA: Diagnosis not present

## 2012-08-05 DIAGNOSIS — H9390 Unspecified disorder of ear, unspecified ear: Secondary | ICD-10-CM | POA: Diagnosis not present

## 2012-08-05 DIAGNOSIS — J398 Other specified diseases of upper respiratory tract: Secondary | ICD-10-CM | POA: Diagnosis not present

## 2012-08-09 DIAGNOSIS — H9390 Unspecified disorder of ear, unspecified ear: Secondary | ICD-10-CM | POA: Diagnosis not present

## 2012-08-09 DIAGNOSIS — J398 Other specified diseases of upper respiratory tract: Secondary | ICD-10-CM | POA: Diagnosis not present

## 2012-08-10 ENCOUNTER — Ambulatory Visit (INDEPENDENT_AMBULATORY_CARE_PROVIDER_SITE_OTHER): Payer: Medicare Other | Admitting: Cardiology

## 2012-08-10 ENCOUNTER — Encounter: Payer: Self-pay | Admitting: Cardiology

## 2012-08-10 VITALS — BP 90/50 | HR 71 | Ht 60.0 in | Wt 84.1 lb

## 2012-08-10 DIAGNOSIS — R5381 Other malaise: Secondary | ICD-10-CM | POA: Diagnosis not present

## 2012-08-10 DIAGNOSIS — R531 Weakness: Secondary | ICD-10-CM

## 2012-08-10 DIAGNOSIS — R5383 Other fatigue: Secondary | ICD-10-CM

## 2012-08-10 DIAGNOSIS — R0602 Shortness of breath: Secondary | ICD-10-CM

## 2012-08-10 LAB — BASIC METABOLIC PANEL
CO2: 31 mEq/L (ref 19–32)
Calcium: 8.9 mg/dL (ref 8.4–10.5)
Chloride: 92 mEq/L — ABNORMAL LOW (ref 96–112)
Creatinine, Ser: 0.8 mg/dL (ref 0.4–1.2)
Sodium: 131 mEq/L — ABNORMAL LOW (ref 135–145)

## 2012-08-10 LAB — CBC
HCT: 37.6 % (ref 36.0–46.0)
Hemoglobin: 12.4 g/dL (ref 12.0–15.0)
MCHC: 33.1 g/dL (ref 30.0–36.0)
RDW: 17.1 % — ABNORMAL HIGH (ref 11.5–14.6)
WBC: 9.8 10*3/uL (ref 4.5–10.5)

## 2012-08-10 LAB — TSH: TSH: 1.37 u[IU]/mL (ref 0.35–5.50)

## 2012-08-10 LAB — BRAIN NATRIURETIC PEPTIDE: Pro B Natriuretic peptide (BNP): 247 pg/mL — ABNORMAL HIGH (ref 0.0–100.0)

## 2012-08-10 MED ORDER — METOPROLOL TARTRATE 50 MG PO TABS: 50.0000 mg | ORAL_TABLET | Freq: Two times a day (BID) | ORAL | Status: DC

## 2012-08-10 MED ORDER — POTASSIUM CHLORIDE CRYS ER 20 MEQ PO TBCR: 20.0000 meq | EXTENDED_RELEASE_TABLET | Freq: Every day | ORAL | Status: DC

## 2012-08-10 MED ORDER — FUROSEMIDE 20 MG PO TABS: 20.0000 mg | ORAL_TABLET | Freq: Two times a day (BID) | ORAL | Status: DC

## 2012-08-10 NOTE — Progress Notes (Signed)
HPI The patient presents for followup of her mitral valve replacement. She has unfortunately had a very slow recovery from this. She's had failure to thrive. She's been staying at a nursing home for rehabilitation but he is actually due to leave tomorrow. She's had a 10 pound weight loss in the last month related to anorexia. She's not describing abdominal pain, nausea vomiting diarrhea or constipation. She had an abdominal ultrasound that was unremarkable. She had liver enzymes that were unremarkable. She does do some rehabilitation getting around a little bit walking but for the most part is in a wheelchair. She does have shortness of breath with activities which she thinks has been going on for about a week. However, she's not having PND or orthopnea. She's not having chest pressure, neck or arm discomfort. She's not having palpitations, presyncope or syncope.   Allergies  Allergen Reactions  . Promethazine Hcl Other (See Comments)    unknown  . Sulfonamide Derivatives Other (See Comments)    unknown  . Tequin Other (See Comments)    hallucinations     Current Outpatient Prescriptions  Medication Sig Dispense Refill  . acetaminophen (TYLENOL) 500 MG tablet Take 1 tablet (500 mg total) by mouth every 6 (six) hours as needed.  30 tablet    . Calcium Carbonate-Vitamin D (CALTRATE 600+D) 600-400 MG-UNIT per tablet Take 1 tablet by mouth 2 (two) times daily.      Marland Kitchen docusate sodium (COLACE) 100 MG capsule Take 100 mg by mouth 2 (two) times daily.      . feeding supplement (ENSURE COMPLETE) LIQD Take 237 mLs by mouth 3 (three) times daily with meals.      . folic acid (FOLVITE) 1 MG tablet Take 2 mg by mouth daily.       . furosemide (LASIX) 40 MG tablet Take 40 mg by mouth 2 (two) times daily.      Marland Kitchen levalbuterol (XOPENEX HFA) 45 MCG/ACT inhaler Inhale 2 puffs into the lungs every 6 (six) hours as needed. For wheezing      . loratadine (CLARITIN) 10 MG tablet Take 1 tablet (10 mg total) by  mouth daily.      . megestrol (MEGACE) 400 MG/10ML suspension Take 10 mLs (400 mg total) by mouth daily.  240 mL    . metoprolol tartrate (LOPRESSOR) 25 MG tablet Take 75 mg by mouth 2 (two) times daily.      . mometasone (NASONEX) 50 MCG/ACT nasal spray Place 2 sprays into the nose daily.      . nitroGLYCERIN (NITROSTAT) 0.4 MG SL tablet Place 1 tablet (0.4 mg total) under the tongue every 5 (five) minutes as needed for chest pain.      Marland Kitchen ondansetron (ZOFRAN) 4 MG tablet Take 4 mg by mouth every 6 (six) hours as needed. For nausea      . potassium chloride SA (K-DUR,KLOR-CON) 20 MEQ tablet Take 2 tablets (40 mEq total) by mouth daily.      . predniSONE (DELTASONE) 2.5 MG tablet Take 2.5 mg by mouth every other day.      . protein supplement (RESOURCE BENEPROTEIN) POWD Take 6 g by mouth 3 (three) times daily with meals.      . roflumilast (DALIRESP) 500 MCG TABS tablet Take 500 mcg by mouth daily.      . traMADol (ULTRAM) 50 MG tablet Take 50 mg by mouth every 4 (four) hours as needed. For pain      . warfarin (COUMADIN) 1 MG  tablet Take 2-3 tablets (2-3 mg total) by mouth daily. Takes 3mg  daily except 2mg  on Monday, Wednesday, and Friday        Past Medical History  Diagnosis Date  . MVP (mitral valve prolapse)   . Pulmonary hypertension     MILD TO MODERATE BY ECHO  . History of chest pain   . Mitral regurgitation     MILD  . Asthma   . Rheumatoid arthritis   . Shortness of breath   . H/O: hysterectomy   . LBBB (left bundle branch block)     per discharge note 2003  . PVC (premature ventricular contraction)   . Atrial fibrillation   . Hypertension   . Heart murmur   . S/P mitral valve replacement with bioprosthetic valve 05/18/2012    27mm Edwards Magna Mitral pericardial bioprosthesis   . S/P Maze operation for atrial fibrillation 05/18/2012    Complete biatrial lesion set using cryothermy with oversewing of LA appendage  . COPD (chronic obstructive pulmonary disease)   . CHF  (congestive heart failure)   . Pacemaker     Past Surgical History  Procedure Date  . Cholecystectomy   . Ovarian cyst removal   . Hysterectomy   . Appendectomy   . Cataract extraction   . Tee without cardioversion 02/09/2012    Procedure: TRANSESOPHAGEAL ECHOCARDIOGRAM (TEE);  Surgeon: Pricilla Riffle, MD;  Location: [facility] ENDOSCOPY;  Service: Cardiovascular;  Laterality: N/A;  . Maze 05/18/2012    Procedure: MAZE;  Surgeon: Purcell Nails, MD;  Location: [facility] OR;  Service: Open Heart Surgery;  Laterality: Right;  . Mitral valve replacement 05/18/2012    Procedure: MINIMALLY INVASIVE MITRAL VALVE (MV) REPLACEMENT;  Surgeon: Purcell Nails, MD;  Location: [facility] OR;  Service: Open Heart Surgery;  Laterality: N/A;  . Pacemaker insertion   . Abdominal hysterectomy     ROS:  As stated in the HPI and negative for all other systems.  PHYSICAL EXAM BP 90/50  Pulse 71  Ht 5' (1.524 m)  Wt 84 lb 1.9 oz (38.157 kg)  BMI 16.43 kg/m2 GEN:  Very frail NECK:  No jugular venous distention at 90 degrees, waveform within normal limits, carotid upstroke brisk and symmetric, no bruits, no thyromegaly LYMPHATICS:  No cervical adenopathy LUNGS:  Clear to auscultation bilaterally BACK:  No CVA tenderness CHEST:  Well healed surgical scars. HEART:  S1 and S2 within normal limits, no S3, no S4, no clicks, no rubs, no murmurs ABD:  Positive bowel sounds normal in frequency in pitch, no bruits, no rebound, no guarding, unable to assess midline mass or bruit with the patient seated. EXT:  2 plus pulses throughout, moderate edema, no cyanosis no clubbing SKIN:  No rashes no nodules NEURO:  Cranial nerves II through XII grossly intact, motor grossly intact throughout PSYCH:  Cognitively intact, oriented to person place and time   ASSESSMENT AND PLAN  Chronic Diastolic CHF:  Overall, her volume appears stable. However, she does have some shortness of breath so I will check a BNP level. She apparently had a chest  x-ray recently that was unremarkable per her sister but I do not have this result.  Atrial Flutter: At this point, would recommend continuing her Coumadin although she appears to be maintaining sinus rhythm. I will reassess this at upcoming visit.. We will continue her current dose of metoprolol.   Anorexia:  I would suggest followup with her primary provider. Workup to date has been unremarkable.  Status  Post Mitral Valve Replacement: Followup with Dr. Cornelius Moras as directed.   Bradycardia, s/p Pacer: Follow up with Dr. Lewayne Bunting as directed.   Deconditioning/failure to thrive: I hope that she can continue some PT. With her polypharmacy and failure to thrive I'm going to try to back off on some of her medications. I will decrease her Lasix. I will decrease the potassium. I will reduce her beta blocker. I will also be checking some routine labs to include a CBC basic metabolic profile and TSH.

## 2012-08-10 NOTE — Patient Instructions (Addendum)
Please decrease Lasix to 20 mg twice a day Decrease Metoprolol to 50 mg twice a day Decrease Potassium to 20 MEQ daily Continue all other medications as listed.  Have blood work today (CBC, BMP, BNP and TSH)  and again in 2 weeks (BMP)  Please continue rehab for weakness-fatigue.  Follow up with Tereso Newcomer, PA in 6 weeks.

## 2012-08-13 DIAGNOSIS — R5381 Other malaise: Secondary | ICD-10-CM | POA: Diagnosis not present

## 2012-08-13 DIAGNOSIS — I509 Heart failure, unspecified: Secondary | ICD-10-CM | POA: Diagnosis not present

## 2012-08-13 DIAGNOSIS — F329 Major depressive disorder, single episode, unspecified: Secondary | ICD-10-CM | POA: Diagnosis not present

## 2012-08-13 DIAGNOSIS — Z950 Presence of cardiac pacemaker: Secondary | ICD-10-CM | POA: Diagnosis not present

## 2012-08-13 DIAGNOSIS — D649 Anemia, unspecified: Secondary | ICD-10-CM | POA: Diagnosis not present

## 2012-08-13 DIAGNOSIS — R5383 Other fatigue: Secondary | ICD-10-CM | POA: Diagnosis not present

## 2012-08-18 DIAGNOSIS — R0602 Shortness of breath: Secondary | ICD-10-CM | POA: Diagnosis not present

## 2012-08-18 DIAGNOSIS — I10 Essential (primary) hypertension: Secondary | ICD-10-CM | POA: Diagnosis not present

## 2012-08-19 DIAGNOSIS — R64 Cachexia: Secondary | ICD-10-CM | POA: Diagnosis not present

## 2012-08-19 DIAGNOSIS — R634 Abnormal weight loss: Secondary | ICD-10-CM | POA: Diagnosis not present

## 2012-08-19 DIAGNOSIS — R627 Adult failure to thrive: Secondary | ICD-10-CM | POA: Diagnosis not present

## 2012-08-20 ENCOUNTER — Inpatient Hospital Stay (HOSPITAL_COMMUNITY)
Admission: EM | Admit: 2012-08-20 | Discharge: 2012-08-24 | DRG: 308 | Disposition: A | Discharge status: Skilled Nursing Facility | Payer: Medicare Other | Attending: Internal Medicine | Admitting: Internal Medicine

## 2012-08-20 ENCOUNTER — Encounter (HOSPITAL_COMMUNITY): Payer: Self-pay | Admitting: Emergency Medicine

## 2012-08-20 ENCOUNTER — Emergency Department (HOSPITAL_COMMUNITY): Payer: Medicare Other

## 2012-08-20 DIAGNOSIS — I059 Rheumatic mitral valve disease, unspecified: Secondary | ICD-10-CM | POA: Diagnosis present

## 2012-08-20 DIAGNOSIS — R079 Chest pain, unspecified: Secondary | ICD-10-CM

## 2012-08-20 DIAGNOSIS — Z681 Body mass index (BMI) 19 or less, adult: Secondary | ICD-10-CM

## 2012-08-20 DIAGNOSIS — I2789 Other specified pulmonary heart diseases: Secondary | ICD-10-CM

## 2012-08-20 DIAGNOSIS — J449 Chronic obstructive pulmonary disease, unspecified: Secondary | ICD-10-CM | POA: Diagnosis present

## 2012-08-20 DIAGNOSIS — I270 Primary pulmonary hypertension: Secondary | ICD-10-CM | POA: Diagnosis not present

## 2012-08-20 DIAGNOSIS — Z8679 Personal history of other diseases of the circulatory system: Secondary | ICD-10-CM

## 2012-08-20 DIAGNOSIS — I080 Rheumatic disorders of both mitral and aortic valves: Secondary | ICD-10-CM

## 2012-08-20 DIAGNOSIS — J189 Pneumonia, unspecified organism: Secondary | ICD-10-CM | POA: Diagnosis not present

## 2012-08-20 DIAGNOSIS — I10 Essential (primary) hypertension: Secondary | ICD-10-CM | POA: Diagnosis present

## 2012-08-20 DIAGNOSIS — J309 Allergic rhinitis, unspecified: Secondary | ICD-10-CM

## 2012-08-20 DIAGNOSIS — Z79899 Other long term (current) drug therapy: Secondary | ICD-10-CM | POA: Diagnosis not present

## 2012-08-20 DIAGNOSIS — J329 Chronic sinusitis, unspecified: Secondary | ICD-10-CM

## 2012-08-20 DIAGNOSIS — J984 Other disorders of lung: Secondary | ICD-10-CM | POA: Diagnosis not present

## 2012-08-20 DIAGNOSIS — R1312 Dysphagia, oropharyngeal phase: Secondary | ICD-10-CM | POA: Diagnosis not present

## 2012-08-20 DIAGNOSIS — R5383 Other fatigue: Secondary | ICD-10-CM

## 2012-08-20 DIAGNOSIS — E46 Unspecified protein-calorie malnutrition: Secondary | ICD-10-CM

## 2012-08-20 DIAGNOSIS — IMO0002 Reserved for concepts with insufficient information to code with codable children: Secondary | ICD-10-CM

## 2012-08-20 DIAGNOSIS — I5033 Acute on chronic diastolic (congestive) heart failure: Secondary | ICD-10-CM

## 2012-08-20 DIAGNOSIS — Z950 Presence of cardiac pacemaker: Secondary | ICD-10-CM

## 2012-08-20 DIAGNOSIS — R9431 Abnormal electrocardiogram [ECG] [EKG]: Secondary | ICD-10-CM

## 2012-08-20 DIAGNOSIS — M069 Rheumatoid arthritis, unspecified: Secondary | ICD-10-CM | POA: Diagnosis present

## 2012-08-20 DIAGNOSIS — I4892 Unspecified atrial flutter: Secondary | ICD-10-CM

## 2012-08-20 DIAGNOSIS — H9390 Unspecified disorder of ear, unspecified ear: Secondary | ICD-10-CM | POA: Diagnosis not present

## 2012-08-20 DIAGNOSIS — I509 Heart failure, unspecified: Secondary | ICD-10-CM | POA: Diagnosis present

## 2012-08-20 DIAGNOSIS — E860 Dehydration: Secondary | ICD-10-CM | POA: Diagnosis present

## 2012-08-20 DIAGNOSIS — N185 Chronic kidney disease, stage 5: Secondary | ICD-10-CM | POA: Diagnosis not present

## 2012-08-20 DIAGNOSIS — M6281 Muscle weakness (generalized): Secondary | ICD-10-CM | POA: Diagnosis not present

## 2012-08-20 DIAGNOSIS — J90 Pleural effusion, not elsewhere classified: Secondary | ICD-10-CM

## 2012-08-20 DIAGNOSIS — I340 Nonrheumatic mitral (valve) insufficiency: Secondary | ICD-10-CM

## 2012-08-20 DIAGNOSIS — J40 Bronchitis, not specified as acute or chronic: Secondary | ICD-10-CM

## 2012-08-20 DIAGNOSIS — I4891 Unspecified atrial fibrillation: Principal | ICD-10-CM

## 2012-08-20 DIAGNOSIS — D72829 Elevated white blood cell count, unspecified: Secondary | ICD-10-CM | POA: Diagnosis not present

## 2012-08-20 DIAGNOSIS — N183 Chronic kidney disease, stage 3 unspecified: Secondary | ICD-10-CM

## 2012-08-20 DIAGNOSIS — Z953 Presence of xenogenic heart valve: Secondary | ICD-10-CM

## 2012-08-20 DIAGNOSIS — J398 Other specified diseases of upper respiratory tract: Secondary | ICD-10-CM | POA: Diagnosis not present

## 2012-08-20 DIAGNOSIS — Z954 Presence of other heart-valve replacement: Secondary | ICD-10-CM | POA: Diagnosis not present

## 2012-08-20 DIAGNOSIS — E876 Hypokalemia: Secondary | ICD-10-CM | POA: Diagnosis present

## 2012-08-20 DIAGNOSIS — R0602 Shortness of breath: Secondary | ICD-10-CM

## 2012-08-20 DIAGNOSIS — N189 Chronic kidney disease, unspecified: Secondary | ICD-10-CM

## 2012-08-20 DIAGNOSIS — R627 Adult failure to thrive: Secondary | ICD-10-CM

## 2012-08-20 DIAGNOSIS — R262 Difficulty in walking, not elsewhere classified: Secondary | ICD-10-CM | POA: Diagnosis not present

## 2012-08-20 DIAGNOSIS — R5381 Other malaise: Secondary | ICD-10-CM | POA: Diagnosis not present

## 2012-08-20 DIAGNOSIS — R279 Unspecified lack of coordination: Secondary | ICD-10-CM | POA: Diagnosis not present

## 2012-08-20 DIAGNOSIS — R531 Weakness: Secondary | ICD-10-CM

## 2012-08-20 DIAGNOSIS — Z7901 Long term (current) use of anticoagulants: Secondary | ICD-10-CM

## 2012-08-20 DIAGNOSIS — J9819 Other pulmonary collapse: Secondary | ICD-10-CM | POA: Diagnosis not present

## 2012-08-20 DIAGNOSIS — Z952 Presence of prosthetic heart valve: Secondary | ICD-10-CM | POA: Diagnosis not present

## 2012-08-20 DIAGNOSIS — R918 Other nonspecific abnormal finding of lung field: Secondary | ICD-10-CM | POA: Diagnosis not present

## 2012-08-20 DIAGNOSIS — R404 Transient alteration of awareness: Secondary | ICD-10-CM | POA: Diagnosis not present

## 2012-08-20 DIAGNOSIS — J4489 Other specified chronic obstructive pulmonary disease: Secondary | ICD-10-CM

## 2012-08-20 DIAGNOSIS — R001 Bradycardia, unspecified: Secondary | ICD-10-CM

## 2012-08-20 LAB — URINE MICROSCOPIC-ADD ON

## 2012-08-20 LAB — COMPREHENSIVE METABOLIC PANEL
ALT: 12 U/L (ref 0–35)
AST: 22 U/L (ref 0–37)
Albumin: 2.6 g/dL — ABNORMAL LOW (ref 3.5–5.2)
CO2: 27 mEq/L (ref 19–32)
Calcium: 9.8 mg/dL (ref 8.4–10.5)
Creatinine, Ser: 0.65 mg/dL (ref 0.50–1.10)
GFR calc non Af Amer: 79 mL/min — ABNORMAL LOW (ref >90)
Sodium: 140 mEq/L (ref 135–145)
Total Protein: 6.3 g/dL (ref 6.0–8.3)

## 2012-08-20 LAB — CBC WITH DIFFERENTIAL/PLATELET
Basophils Absolute: 0 10*3/uL (ref 0.0–0.1)
Basophils Relative: 0 % (ref 0–1)
Eosinophils Absolute: 0 10*3/uL (ref 0.0–0.7)
Eosinophils Relative: 0 % (ref 0–5)
HCT: 39.7 % (ref 36.0–46.0)
MCH: 27.1 pg (ref 26.0–34.0)
MCHC: 31.5 g/dL (ref 30.0–36.0)
MCV: 85.9 fL (ref 78.0–100.0)
Monocytes Absolute: 1 10*3/uL (ref 0.1–1.0)
Platelets: 207 10*3/uL (ref 150–400)
RDW: 16.5 % — ABNORMAL HIGH (ref 11.5–15.5)
WBC: 16 10*3/uL — ABNORMAL HIGH (ref 4.0–10.5)

## 2012-08-20 LAB — URINALYSIS, ROUTINE W REFLEX MICROSCOPIC
Hgb urine dipstick: NEGATIVE
Specific Gravity, Urine: 1.019 (ref 1.005–1.030)
Urobilinogen, UA: 0.2 mg/dL (ref 0.0–1.0)
pH: 5.5 (ref 5.0–8.0)

## 2012-08-20 LAB — PROTIME-INR
INR: 2.09 — ABNORMAL HIGH (ref 0.00–1.49)
Prothrombin Time: 22.6 seconds — ABNORMAL HIGH (ref 11.6–15.2)

## 2012-08-20 LAB — CG4 I-STAT (LACTIC ACID): Lactic Acid, Venous: 2.6 mmol/L — ABNORMAL HIGH (ref 0.5–2.2)

## 2012-08-20 LAB — D-DIMER, QUANTITATIVE: D-Dimer, Quant: 2 ug/mL-FEU — ABNORMAL HIGH (ref 0.00–0.48)

## 2012-08-20 LAB — TROPONIN I: Troponin I: <0.3 ng/mL (ref <0.30)

## 2012-08-20 MED ORDER — ESCITALOPRAM OXALATE 20 MG PO TABS
20.0000 mg | ORAL_TABLET | Freq: Every day | ORAL | Status: DC
  Administered 2012-08-21 – 2012-08-24 (×4): 20 mg via ORAL
  Filled 2012-08-20 (×5): qty 1

## 2012-08-20 MED ORDER — SODIUM CHLORIDE 0.9 % IJ SOLN
3.0000 mL | Freq: Two times a day (BID) | INTRAMUSCULAR | Status: DC
  Administered 2012-08-20 – 2012-08-23 (×6): 3 mL via INTRAVENOUS

## 2012-08-20 MED ORDER — FLUTICASONE PROPIONATE 50 MCG/ACT NA SUSP
1.0000 | Freq: Every day | NASAL | Status: DC
  Administered 2012-08-21 – 2012-08-24 (×4): 1 via NASAL
  Filled 2012-08-20: qty 16

## 2012-08-20 MED ORDER — ACETAMINOPHEN 650 MG RE SUPP: 650.0000 mg | Freq: Four times a day (QID) | RECTAL | Status: DC | PRN

## 2012-08-20 MED ORDER — FOLIC ACID 1 MG PO TABS
2.0000 mg | ORAL_TABLET | Freq: Every day | ORAL | Status: DC
  Administered 2012-08-21 – 2012-08-24 (×4): 2 mg via ORAL
  Filled 2012-08-20 (×5): qty 2

## 2012-08-20 MED ORDER — ROFLUMILAST 500 MCG PO TABS
500.0000 ug | ORAL_TABLET | Freq: Every day | ORAL | Status: DC
  Administered 2012-08-21 – 2012-08-24 (×4): 500 ug via ORAL
  Filled 2012-08-20 (×6): qty 1

## 2012-08-20 MED ORDER — METOPROLOL TARTRATE 50 MG PO TABS
50.0000 mg | ORAL_TABLET | Freq: Two times a day (BID) | ORAL | Status: DC
  Administered 2012-08-21 – 2012-08-22 (×2): 50 mg via ORAL
  Filled 2012-08-20 (×9): qty 1

## 2012-08-20 MED ORDER — ACETAMINOPHEN 325 MG PO TABS: 650.0000 mg | ORAL_TABLET | Freq: Four times a day (QID) | ORAL | Status: DC | PRN

## 2012-08-20 MED ORDER — WARFARIN SODIUM 3 MG PO TABS
3.0000 mg | ORAL_TABLET | Freq: Once | ORAL | Status: DC
  Filled 2012-08-20: qty 1

## 2012-08-20 MED ORDER — TRAMADOL HCL 50 MG PO TABS: 50.0000 mg | ORAL_TABLET | ORAL | Status: DC | PRN

## 2012-08-20 MED ORDER — ENSURE COMPLETE PO LIQD
237.0000 mL | Freq: Three times a day (TID) | ORAL | Status: DC
  Administered 2012-08-21 – 2012-08-24 (×9): 237 mL via ORAL
  Filled 2012-08-20: qty 237

## 2012-08-20 MED ORDER — SODIUM CHLORIDE 0.9 % IJ SOLN
3.0000 mL | Freq: Two times a day (BID) | INTRAMUSCULAR | Status: DC
  Administered 2012-08-20 – 2012-08-23 (×5): 3 mL via INTRAVENOUS

## 2012-08-20 MED ORDER — NITROGLYCERIN 0.4 MG SL SUBL: 0.4000 mg | SUBLINGUAL_TABLET | SUBLINGUAL | Status: DC | PRN

## 2012-08-20 MED ORDER — LORATADINE 10 MG PO TABS
10.0000 mg | ORAL_TABLET | Freq: Every day | ORAL | Status: DC
  Administered 2012-08-21 – 2012-08-24 (×4): 10 mg via ORAL
  Filled 2012-08-20 (×5): qty 1

## 2012-08-20 MED ORDER — SODIUM CHLORIDE 0.9 % IV SOLN: INTRAVENOUS | Status: DC

## 2012-08-20 MED ORDER — BENEPROTEIN PO POWD
1.0000 | Freq: Three times a day (TID) | ORAL | Status: DC
  Administered 2012-08-21 – 2012-08-23 (×6): 6 g via ORAL
  Filled 2012-08-20 (×2): qty 227

## 2012-08-20 MED ORDER — DOXYCYCLINE HYCLATE 100 MG IV SOLR
100.0000 mg | Freq: Two times a day (BID) | INTRAVENOUS | Status: DC
  Administered 2012-08-20: 100 mg via INTRAVENOUS
  Filled 2012-08-20 (×3): qty 100

## 2012-08-20 MED ORDER — ONDANSETRON HCL 4 MG PO TABS: 4.0000 mg | ORAL_TABLET | Freq: Four times a day (QID) | ORAL | Status: DC | PRN

## 2012-08-20 MED ORDER — MEGESTROL ACETATE 400 MG/10ML PO SUSP
400.0000 mg | Freq: Every day | ORAL | Status: DC
  Administered 2012-08-21 – 2012-08-23 (×3): 400 mg via ORAL
  Filled 2012-08-20 (×5): qty 10

## 2012-08-20 MED ORDER — DOCUSATE SODIUM 100 MG PO CAPS
100.0000 mg | ORAL_CAPSULE | Freq: Two times a day (BID) | ORAL | Status: DC
  Administered 2012-08-21 – 2012-08-23 (×6): 100 mg via ORAL
  Filled 2012-08-20 (×8): qty 1

## 2012-08-20 MED ORDER — WARFARIN - PHARMACIST DOSING INPATIENT: Freq: Every day | Status: DC

## 2012-08-20 MED ORDER — PREDNISONE 2.5 MG PO TABS
2.5000 mg | ORAL_TABLET | ORAL | Status: DC
  Administered 2012-08-22 – 2012-08-24 (×2): 2.5 mg via ORAL
  Filled 2012-08-20 (×3): qty 1

## 2012-08-20 MED ORDER — SODIUM CHLORIDE 0.9 % IV BOLUS (SEPSIS)
250.0000 mL | Freq: Once | INTRAVENOUS | Status: AC
  Administered 2012-08-20: 250 mL via INTRAVENOUS

## 2012-08-20 MED ORDER — ONDANSETRON HCL 4 MG/2ML IJ SOLN: 4.0000 mg | Freq: Four times a day (QID) | INTRAMUSCULAR | Status: DC | PRN

## 2012-08-20 MED ORDER — LEVALBUTEROL TARTRATE 45 MCG/ACT IN AERO
2.0000 | INHALATION_SPRAY | Freq: Four times a day (QID) | RESPIRATORY_TRACT | Status: DC | PRN
  Administered 2012-08-22 – 2012-08-24 (×4): 2 via RESPIRATORY_TRACT
  Filled 2012-08-20: qty 15

## 2012-08-20 NOTE — ED Provider Notes (Signed)
History     CSN: [id number]  Arrival date & time 08/20/12  1223   First MD Initiated Contact with Patient 08/20/12 1231      Chief Complaint  Patient presents with  . Respiratory Distress    (Consider location/radiation/quality/duration/timing/severity/associated sxs/prior treatment) The history is provided by the patient, the EMS personnel, the nursing home and a friend.   79 year old female brought in by EMS from [place] nursing facility. Patient's been there for several months following a cardiac surgery where she was getting rehabilitation. Initial report was the patient was in respiratory distress put on oxygen she was doing fine there also was noted she has had weakness and failure to thrive over the last several days. Patient appeared pale very weak very dehydrated but mentally was alert would answer questions. A little bit somnolent. Alternating fine on 2 L of oxygen. Patient did not have any specific complaints. Denied any shortness of breath or chest pain or abdominal pain headache.   Past Medical History  Diagnosis Date  . MVP (mitral valve prolapse)   . Pulmonary hypertension     MILD TO MODERATE BY ECHO  . History of chest pain   . Mitral regurgitation     MILD  . Asthma   . Rheumatoid arthritis   . Shortness of breath   . H/O: hysterectomy   . LBBB (left bundle branch block)     per discharge note 2003  . PVC (premature ventricular contraction)   . Atrial fibrillation   . Hypertension   . Heart murmur   . S/P mitral valve replacement with bioprosthetic valve 05/18/2012    27mm Edwards Magna Mitral pericardial bioprosthesis   . S/P Maze operation for atrial fibrillation 05/18/2012    Complete biatrial lesion set using cryothermy with oversewing of LA appendage  . COPD (chronic obstructive pulmonary disease)   . CHF (congestive heart failure)   . Pacemaker     Past Surgical History  Procedure Date  . Cholecystectomy   . Ovarian cyst removal   .  Hysterectomy   . Appendectomy   . Cataract extraction   . Tee without cardioversion 02/09/2012    Procedure: TRANSESOPHAGEAL ECHOCARDIOGRAM (TEE);  Surgeon: Pricilla Riffle, MD;  Location: [facility] ENDOSCOPY;  Service: Cardiovascular;  Laterality: N/A;  . Maze 05/18/2012    Procedure: MAZE;  Surgeon: Purcell Nails, MD;  Location: [facility] OR;  Service: Open Heart Surgery;  Laterality: Right;  . Mitral valve replacement 05/18/2012    Procedure: MINIMALLY INVASIVE MITRAL VALVE (MV) REPLACEMENT;  Surgeon: Purcell Nails, MD;  Location: [facility] OR;  Service: Open Heart Surgery;  Laterality: N/A;  . Pacemaker insertion   . Abdominal hysterectomy     No family history on file.  History  Substance Use Topics  . Smoking status: Never Smoker   . Smokeless tobacco: Not on file  . Alcohol Use: No    OB History    Grav Para Term Preterm Abortions TAB SAB Ect Mult Living                  Review of Systems  Constitutional: Positive for fatigue. Negative for fever and appetite change.  HENT: Negative for congestion.   Eyes: Negative for redness.  Respiratory: Positive for shortness of breath. Negative for cough.   Cardiovascular: Negative for chest pain.  Gastrointestinal: Negative for nausea, vomiting and abdominal pain.  Genitourinary: Negative for dysuria.  Musculoskeletal: Negative for back pain.  Skin: Negative for rash.  Neurological: Positive  for weakness. Negative for headaches.  Hematological: Does not bruise/bleed easily.    Allergies  Promethazine hcl; Sulfonamide derivatives; and Tequin  Home Medications   Current Outpatient Rx  Name  Route  Sig  Dispense  Refill  . ESCITALOPRAM OXALATE 20 MG PO TABS   Oral   Take 20 mg by mouth daily.         . WARFARIN SODIUM 5 MG PO TABS   Oral   Take 5 mg by mouth daily.         . ACETAMINOPHEN 500 MG PO TABS   Oral   Take 1 tablet (500 mg total) by mouth every 6 (six) hours as needed.   30 tablet      . CALCIUM CARBONATE-VITAMIN D  600-400 MG-UNIT PO TABS   Oral   Take 1 tablet by mouth 2 (two) times daily.         Marland Kitchen DOCUSATE SODIUM 100 MG PO CAPS   Oral   Take 100 mg by mouth 2 (two) times daily.         Marland Kitchen ENSURE COMPLETE PO LIQD   Oral   Take 237 mLs by mouth 3 (three) times daily with meals.         Marland Kitchen FOLIC ACID 1 MG PO TABS   Oral   Take 2 mg by mouth daily.          . FUROSEMIDE 20 MG PO TABS   Oral   Take 1 tablet (20 mg total) by mouth 2 (two) times daily.   30 tablet      . LEVALBUTEROL TARTRATE 45 MCG/ACT IN AERO   Inhalation   Inhale 2 puffs into the lungs every 6 (six) hours as needed. For wheezing         . LORATADINE 10 MG PO TABS   Oral   Take 1 tablet (10 mg total) by mouth daily.         . MEGESTROL ACETATE 400 MG/10ML PO SUSP   Oral   Take 10 mLs (400 mg total) by mouth daily.   240 mL      . METOPROLOL TARTRATE 50 MG PO TABS   Oral   Take 1 tablet (50 mg total) by mouth 2 (two) times daily.         . MOMETASONE FUROATE 50 MCG/ACT NA SUSP   Nasal   Place 2 sprays into the nose daily.         Marland Kitchen NITROGLYCERIN 0.4 MG SL SUBL   Sublingual   Place 1 tablet (0.4 mg total) under the tongue every 5 (five) minutes as needed for chest pain.         Marland Kitchen ONDANSETRON HCL 4 MG PO TABS   Oral   Take 4 mg by mouth every 6 (six) hours as needed. For nausea         . POTASSIUM CHLORIDE CRYS ER 20 MEQ PO TBCR   Oral   Take 1 tablet (20 mEq total) by mouth daily.         Marland Kitchen PREDNISONE 2.5 MG PO TABS   Oral   Take 2.5 mg by mouth every other day.         Scherrie November PO POWD   Oral   Take 6 g by mouth 3 (three) times daily with meals.         . ROFLUMILAST 500 MCG PO TABS   Oral   Take 500 mcg by mouth daily.         Marland Kitchen  TRAMADOL HCL 50 MG PO TABS   Oral   Take 50 mg by mouth every 4 (four) hours as needed. For pain         . WARFARIN SODIUM 1 MG PO TABS   Oral   Take 2-3 tablets (2-3 mg total) by mouth daily. Takes 3mg  daily except 2mg  on Monday,  Wednesday, and Friday           Dispense as written.     BP 113/44  Pulse 70  Temp 98.7 F (37.1 C) (Oral)  Resp 29  SpO2 98%  Physical Exam  Nursing note and vitals reviewed. Constitutional: She appears well-developed and well-nourished.  HENT:  Head: Normocephalic and atraumatic.       Mucous membranes are very dry.  Eyes: Conjunctivae normal and EOM are normal. Pupils are equal, round, and reactive to light.  Neck: Normal range of motion. Neck supple.  Cardiovascular: Normal rate and regular rhythm.        Tachycardic.  Pulmonary/Chest: Effort normal and breath sounds normal. No respiratory distress. She has no wheezes. She has no rales.  Abdominal: Soft. Bowel sounds are normal. There is no tenderness.  Musculoskeletal: Normal range of motion.  Neurological: She is alert. No cranial nerve deficit. Coordination normal.  Skin: Skin is warm. No rash noted.    ED Course  Procedures (including critical care time)  Labs Reviewed  CBC WITH DIFFERENTIAL - Abnormal; Notable for the following:    WBC 16.0 (*)     RDW 16.5 (*)     Neutrophils Relative 89 (*)     Neutro Abs 14.3 (*)     Lymphocytes Relative 4 (*)     All other components within normal limits  COMPREHENSIVE METABOLIC PANEL - Abnormal; Notable for the following:    Glucose, Bld 142 (*)     Albumin 2.6 (*)     GFR calc non Af Amer 79 (*)     All other components within normal limits  URINALYSIS, ROUTINE W REFLEX MICROSCOPIC - Abnormal; Notable for the following:    Glucose, UA 100 (*)     Leukocytes, UA SMALL (*)     All other components within normal limits  URINE MICROSCOPIC-ADD ON - Abnormal; Notable for the following:    Casts HYALINE CASTS (*)     All other components within normal limits  CG4 I-STAT (LACTIC ACID) - Abnormal; Notable for the following:    Lactic Acid, Venous 2.60 (*)     All other components within normal limits  TROPONIN I  URINE CULTURE  CULTURE, BLOOD (ROUTINE X 2)  CULTURE,  BLOOD (ROUTINE X 2)   Dg Chest Port 1 View  08/20/2012  *RADIOLOGY REPORT*  Clinical Data: Respiratory distress.  PORTABLE CHEST - 1 VIEW  Comparison: 06/23/2012  Findings: Left pacer remains in place, unchanged.  Prior mitral valve replacement.  Heart is borderline in size.  Small left effusion with left base atelectasis.  Right lung is clear.  IMPRESSION: Small left effusion.  Left base atelectasis.   Original Report Authenticated By: Charlett Nose, M.D.    Results for orders placed during the hospital encounter of 08/20/12  CBC WITH DIFFERENTIAL      Component Value Range   WBC 16.0 (*) 4.0 - 10.5 K/uL   RBC 4.62  3.87 - 5.11 MIL/uL   Hemoglobin 12.5  12.0 - 15.0 g/dL   HCT 40.9  81.1 - 91.4 %   MCV 85.9  78.0 - 100.0 fL  MCH 27.1  26.0 - 34.0 pg   MCHC 31.5  30.0 - 36.0 g/dL   RDW 40.9 (*) 81.1 - 91.4 %   Platelets 207  150 - 400 K/uL   Neutrophils Relative 89 (*) 43 - 77 %   Neutro Abs 14.3 (*) 1.7 - 7.7 K/uL   Lymphocytes Relative 4 (*) 12 - 46 %   Lymphs Abs 0.7  0.7 - 4.0 K/uL   Monocytes Relative 6  3 - 12 %   Monocytes Absolute 1.0  0.1 - 1.0 K/uL   Eosinophils Relative 0  0 - 5 %   Eosinophils Absolute 0.0  0.0 - 0.7 K/uL   Basophils Relative 0  0 - 1 %   Basophils Absolute 0.0  0.0 - 0.1 K/uL  COMPREHENSIVE METABOLIC PANEL      Component Value Range   Sodium 140  135 - 145 mEq/L   Potassium 3.8  3.5 - 5.1 mEq/L   Chloride 99  96 - 112 mEq/L   CO2 27  19 - 32 mEq/L   Glucose, Bld 142 (*) 70 - 99 mg/dL   BUN 21  6 - 23 mg/dL   Creatinine, Ser 7.82  0.50 - 1.10 mg/dL   Calcium 9.8  8.4 - 95.6 mg/dL   Total Protein 6.3  6.0 - 8.3 g/dL   Albumin 2.6 (*) 3.5 - 5.2 g/dL   AST 22  0 - 37 U/L   ALT 12  0 - 35 U/L   Alkaline Phosphatase 107  39 - 117 U/L   Total Bilirubin 0.7  0.3 - 1.2 mg/dL   GFR calc non Af Amer 79 (*) >90 mL/min   GFR calc Af Amer >90  >90 mL/min  URINALYSIS, ROUTINE W REFLEX MICROSCOPIC      Component Value Range   Color, Urine YELLOW  YELLOW    APPearance CLEAR  CLEAR   Specific Gravity, Urine 1.019  1.005 - 1.030   pH 5.5  5.0 - 8.0   Glucose, UA 100 (*) NEGATIVE mg/dL   Hgb urine dipstick NEGATIVE  NEGATIVE   Bilirubin Urine NEGATIVE  NEGATIVE   Ketones, ur NEGATIVE  NEGATIVE mg/dL   Protein, ur NEGATIVE  NEGATIVE mg/dL   Urobilinogen, UA 0.2  0.0 - 1.0 mg/dL   Nitrite NEGATIVE  NEGATIVE   Leukocytes, UA SMALL (*) NEGATIVE  URINE MICROSCOPIC-ADD ON      Component Value Range   WBC, UA 3-6  <3 WBC/hpf   Bacteria, UA RARE  RARE   Casts HYALINE CASTS (*) NEGATIVE   Urine-Other MUCOUS PRESENT    TROPONIN I      Component Value Range   Troponin I <0.30  <0.30 ng/mL  CG4 I-STAT (LACTIC ACID)      Component Value Range   Lactic Acid, Venous 2.60 (*) 0.5 - 2.2 mmol/L    Date: 08/20/2012  Rate: 115  Rhythm: sinus tachycardia  QRS Axis: normal  Intervals: normal  ST/T Wave abnormalities: nonspecific ST/T changes  Conduction Disutrbances:nonspecific intraventricular conduction delay  Narrative Interpretation:   Old EKG Reviewed: unchanged No significant changes in EKG compared to 07/06/2012   1. Dehydration   2. Shortness of breath       MDM   Patient will be admitted by the triad hospitalist. She is a resident of [facility] nursing facility. She followed by [facility] cardiology primary care Dr. Is with [facility] physicians.  Patient brought in by EMS for respiratory distress. At the nursing facility she  was started on oxygen again in a few days ago and was given some IV fluid yesterday so for the past 2-3 days they will be concerned about dehydration some worsening of her breathing status. She right ear tachycardic blood pressure was not technically hypotensive but she was systolic around 92 heart rate around 1:15 117. With fluids she improved some. She had a leukocytosis that was unexplained no evidence urinary tract infection chest x-ray with a little bit of pleural effusion but no pneumonia. Afebrile here. Further workup like  acid was done which was elevated at 2.2 troponin was negative EKG didn't have any acute changes but was a sinus tachycardia a little bit of a nonspecific ventricular delay. Patient has had the cardiac surgery in the past and has a history of congestive heart fair of as per family members. Just noted that patient is on the Coumadin INR was not ordered that will be ordered. Temporary admit orders have been done. Antibiotics will not be started no distinct infection to treat blood cultures were sent.  Patient with multiple medical problems include COPD CHF of heart valve problems. As stated above followed by [facility] cardiology. Friends relate that the nursing facility she's been slowly getting worse since early December but the last 2-3 days she's been much worse and today she looks even worse. IV fluids given here in the emergency department has significantly improved her vital signs heart rate is come down to 70 pressures improved to 113/44 on 2 L of oxygen she satting 98%.        Shelda Jakes, MD 08/20/12 2011

## 2012-08-20 NOTE — H&P (Signed)
Nancy Bush is an 79 y.o. female.   Patient was seen and examined on August 20, 2012. PCP - Dr. Duane Lope. Cardiologist - Dr. Parker Lions. Chief Complaint: Shortness of breath and weakness. HPI: 79 year old female who has had a bioprosthetic mitral valve replaced and history of atrial fibrillation status post Maze procedure on Coumadin was brought from nursing home after patient was found to be having increasing weakness and shortness of breath. On arrival in the ER patient was found to be in atrial fibrillation with RVR and was initially given fluid bolus normal saline after which heart rate has improved and presently controlled. Patient's chest x-ray showed left pleural effusion which is chronic but did not show any infiltrates. Blood work showed increased lactic acid level and also leukocytosis. Patient was afebrile. As per patient's family was at the bedside patient has had some productive cough for last few days and in the nursing home they had checked his sputum but was not started on any antibiotics. Patient also had some abdominal pain 2 weeks ago and x-rays did not reveal any acute changes as per family presently patient denies any abdominal pain nausea vomiting or diarrhea. Patient has not been eating well for last few weeks and the physicians at the nursing home it discussed about feeding tube with the patient. But yesterday a she did have a large meal as per family. At this time patient will be admitted for further management.  Past Medical History  Diagnosis Date  . MVP (mitral valve prolapse)   . Pulmonary hypertension     MILD TO MODERATE BY ECHO  . History of chest pain   . Mitral regurgitation     MILD  . Asthma   . Rheumatoid arthritis   . Shortness of breath   . H/O: hysterectomy   . LBBB (left bundle branch block)     per discharge note 2003  . PVC (premature ventricular contraction)   . Atrial fibrillation   . Hypertension   . Heart murmur   . S/P mitral valve  replacement with bioprosthetic valve 05/18/2012    27mm Edwards Magna Mitral pericardial bioprosthesis   . S/P Maze operation for atrial fibrillation 05/18/2012    Complete biatrial lesion set using cryothermy with oversewing of LA appendage  . COPD (chronic obstructive pulmonary disease)   . CHF (congestive heart failure)   . Pacemaker     Past Surgical History  Procedure Date  . Cholecystectomy   . Ovarian cyst removal   . Hysterectomy   . Appendectomy   . Cataract extraction   . Tee without cardioversion 02/09/2012    Procedure: TRANSESOPHAGEAL ECHOCARDIOGRAM (TEE);  Surgeon: Pricilla Riffle, MD;  Location: [facility] ENDOSCOPY;  Service: Cardiovascular;  Laterality: N/A;  . Maze 05/18/2012    Procedure: MAZE;  Surgeon: Purcell Nails, MD;  Location: [facility] OR;  Service: Open Heart Surgery;  Laterality: Right;  . Mitral valve replacement 05/18/2012    Procedure: MINIMALLY INVASIVE MITRAL VALVE (MV) REPLACEMENT;  Surgeon: Purcell Nails, MD;  Location: [facility] OR;  Service: Open Heart Surgery;  Laterality: N/A;  . Pacemaker insertion   . Abdominal hysterectomy     History reviewed. No pertinent family history. Social History:  reports that she has never smoked. She does not have any smokeless tobacco history on file. She reports that she does not drink alcohol or use illicit drugs.  Allergies:  Allergies  Allergen Reactions  . Promethazine Hcl Other (See Comments)    unknown  .  Sulfonamide Derivatives Other (See Comments)    unknown  . Tequin Other (See Comments)    hallucinations      (Not in a hospital admission)  Results for orders placed during the hospital encounter of 08/20/12 (from the past 48 hour(s))  CBC WITH DIFFERENTIAL     Status: Abnormal   Collection Time   08/20/12 12:41 PM      Component Value Range Comment   WBC 16.0 (*) 4.0 - 10.5 K/uL    RBC 4.62  3.87 - 5.11 MIL/uL    Hemoglobin 12.5  12.0 - 15.0 g/dL    HCT 16.0  10.9 - 32.3 %    MCV 85.9  78.0 - 100.0 fL     MCH 27.1  26.0 - 34.0 pg    MCHC 31.5  30.0 - 36.0 g/dL    RDW 55.7 (*) 32.2 - 15.5 %    Platelets 207  150 - 400 K/uL    Neutrophils Relative 89 (*) 43 - 77 %    Neutro Abs 14.3 (*) 1.7 - 7.7 K/uL    Lymphocytes Relative 4 (*) 12 - 46 %    Lymphs Abs 0.7  0.7 - 4.0 K/uL    Monocytes Relative 6  3 - 12 %    Monocytes Absolute 1.0  0.1 - 1.0 K/uL    Eosinophils Relative 0  0 - 5 %    Eosinophils Absolute 0.0  0.0 - 0.7 K/uL    Basophils Relative 0  0 - 1 %    Basophils Absolute 0.0  0.0 - 0.1 K/uL   COMPREHENSIVE METABOLIC PANEL     Status: Abnormal   Collection Time   08/20/12 12:41 PM      Component Value Range Comment   Sodium 140  135 - 145 mEq/L    Potassium 3.8  3.5 - 5.1 mEq/L    Chloride 99  96 - 112 mEq/L    CO2 27  19 - 32 mEq/L    Glucose, Bld 142 (*) 70 - 99 mg/dL    BUN 21  6 - 23 mg/dL    Creatinine, Ser 0.25  0.50 - 1.10 mg/dL    Calcium 9.8  8.4 - 42.7 mg/dL    Total Protein 6.3  6.0 - 8.3 g/dL    Albumin 2.6 (*) 3.5 - 5.2 g/dL    AST 22  0 - 37 U/L    ALT 12  0 - 35 U/L    Alkaline Phosphatase 107  39 - 117 U/L    Total Bilirubin 0.7  0.3 - 1.2 mg/dL    GFR calc non Af Amer 79 (*) >90 mL/min    GFR calc Af Amer >90  >90 mL/min   URINALYSIS, ROUTINE W REFLEX MICROSCOPIC     Status: Abnormal   Collection Time   08/20/12  5:20 PM      Component Value Range Comment   Color, Urine YELLOW  YELLOW    APPearance CLEAR  CLEAR    Specific Gravity, Urine 1.019  1.005 - 1.030    pH 5.5  5.0 - 8.0    Glucose, UA 100 (*) NEGATIVE mg/dL    Hgb urine dipstick NEGATIVE  NEGATIVE    Bilirubin Urine NEGATIVE  NEGATIVE    Ketones, ur NEGATIVE  NEGATIVE mg/dL    Protein, ur NEGATIVE  NEGATIVE mg/dL    Urobilinogen, UA 0.2  0.0 - 1.0 mg/dL    Nitrite NEGATIVE  NEGATIVE  Leukocytes, UA SMALL (*) NEGATIVE   URINE MICROSCOPIC-ADD ON     Status: Abnormal   Collection Time   08/20/12  5:20 PM      Component Value Range Comment   WBC, UA 3-6  <3 WBC/hpf    Bacteria, UA RARE   RARE    Casts HYALINE CASTS (*) NEGATIVE    Urine-Other MUCOUS PRESENT     TROPONIN I     Status: Normal   Collection Time   08/20/12  6:27 PM      Component Value Range Comment   Troponin I <0.30  <0.30 ng/mL   CG4 I-STAT (LACTIC ACID)     Status: Abnormal   Collection Time   08/20/12  6:50 PM      Component Value Range Comment   Lactic Acid, Venous 2.60 (*) 0.5 - 2.2 mmol/L    Dg Chest Port 1 View  08/20/2012  *RADIOLOGY REPORT*  Clinical Data: Respiratory distress.  PORTABLE CHEST - 1 VIEW  Comparison: 06/23/2012  Findings: Left pacer remains in place, unchanged.  Prior mitral valve replacement.  Heart is borderline in size.  Small left effusion with left base atelectasis.  Right lung is clear.  IMPRESSION: Small left effusion.  Left base atelectasis.   Original Report Authenticated By: Charlett Nose, M.D.     Review of Systems  HENT: Negative.   Eyes: Negative.   Respiratory: Positive for shortness of breath.   Cardiovascular: Negative.   Gastrointestinal: Negative.   Genitourinary: Negative.   Musculoskeletal: Negative.   Skin: Negative.   Neurological: Positive for weakness.  Endo/Heme/Allergies: Negative.   Psychiatric/Behavioral: Negative.     Blood pressure 97/41, pulse 72, temperature 98.7 F (37.1 C), temperature source Oral, resp. rate 23, SpO2 97.00%. Physical Exam  Constitutional: She is oriented to person, place, and time. She appears well-developed.       Poorly nourished.  HENT:  Head: Normocephalic and atraumatic.  Right Ear: External ear normal.  Left Ear: External ear normal.  Nose: Nose normal.  Mouth/Throat: Oropharynx is clear and moist. No oropharyngeal exudate.  Eyes: Conjunctivae normal are normal. Pupils are equal, round, and reactive to light. Right eye exhibits no discharge. Left eye exhibits no discharge. No scleral icterus.  Neck: Normal range of motion. Neck supple.  Cardiovascular: Normal rate and regular rhythm.   Respiratory: Effort normal  and breath sounds normal. No respiratory distress. She has no wheezes. She has no rales.  GI: Soft. Bowel sounds are normal. She exhibits no distension. There is no tenderness. There is no rebound and no guarding.  Musculoskeletal: She exhibits no edema and no tenderness.  Neurological: She is alert and oriented to person, place, and time.       Moves all extremities.  Skin: Skin is warm and dry.     Assessment/Plan #1. Shortness of breath - most likely this could have been precipitated by her rapid ventricular rate from A. fib causing some CHF. Presently rate is controlled and patient's shortness of breath has improved. Discontinue any further fluids. Since patient has productive cough last few days and has leukocytosis I have ordered doxycycline IV check x-ray again in a.m. to rule out any infiltrates or worsening of any edema. Check d-dimer. Holding off Lasix for now due to the low blood pressures. #2. Leukocytosis - patient is afebrile. Chest x-ray at this time does not show any infiltrates we'll get a repeat chest x-ray in a.m. UA is unremarkable. Blood cultures have been ordered the ER,  urine cultures which has to be followed. Patient at this time has been empirically started on doxycycline as patient had productive cough. Patient is afebrile. #3. Increased lactic acid level - patient does not look septic. Does have mild low blood pressures in the 90s. Repeat lactic acid level in a.m. Patient did receive some fluid in the ER initially at this time we will discontinue any further fluid given patient's history of CHF. Patient does not complain of any abdominal pain at this time. #4. Atrial fibrillation with RVR presently rate controlled - continue Coumadin per pharmacy. Continue rate limiting medications. #5. History of mitral valve replacement bioprosthetic - continue to observe clinically. #6. History of chronic diastolic heart failure - resume Lasix once blood pressure is acceptable. Follow  intake output closely. #7. Failure to thrive - patient has very poor appetite. Consult nutritionist.  CODE STATUS - full code.  Eduard Clos 08/20/2012, 8:57 PM

## 2012-08-20 NOTE — ED Notes (Signed)
Pt brought to ED by EMS with complain of respiratory distress ,weakness and failure to thrive.

## 2012-08-20 NOTE — Progress Notes (Signed)
ANTICOAGULATION CONSULT NOTE - Initial Consult  Pharmacy Consult for coumadin Indication: atrial fibrillation/MVR  Allergies  Allergen Reactions  . Promethazine Hcl Other (See Comments)    unknown  . Sulfonamide Derivatives Other (See Comments)    unknown  . Tequin Other (See Comments)    hallucinations     Patient Measurements:   Vital Signs: Temp: 98.7 F (37.1 C) (01/17 1240) Temp src: Oral (01/17 1240) BP: 97/41 mmHg (01/17 2000) Pulse Rate: 72  (01/17 2000)  Labs:  Basename 08/20/12 2040 08/20/12 1827 08/20/12 1241  HGB -- -- 12.5  HCT -- -- 39.7  PLT -- -- 207  APTT -- -- --  LABPROT 22.6* -- --  INR 2.09* -- --  HEPARINUNFRC -- -- --  CREATININE -- -- 0.65  CKTOTAL -- -- --  CKMB -- -- --  TROPONINI -- <0.30 --    The CrCl is unknown because both a height and weight (above a minimum accepted value) are required for this calculation.   Medical History: Past Medical History  Diagnosis Date  . MVP (mitral valve prolapse)   . Pulmonary hypertension     MILD TO MODERATE BY ECHO  . History of chest pain   . Mitral regurgitation     MILD  . Asthma   . Rheumatoid arthritis   . Shortness of breath   . H/O: hysterectomy   . LBBB (left bundle branch block)     per discharge note 2003  . PVC (premature ventricular contraction)   . Atrial fibrillation   . Hypertension   . Heart murmur   . S/P mitral valve replacement with bioprosthetic valve 05/18/2012    27mm Edwards Magna Mitral pericardial bioprosthesis   . S/P Maze operation for atrial fibrillation 05/18/2012    Complete biatrial lesion set using cryothermy with oversewing of LA appendage  . COPD (chronic obstructive pulmonary disease)   . CHF (congestive heart failure)   . Pacemaker     Medications:   (Not in a hospital admission) Scheduled:    Assessment: 79 yo with MMP who was admitted for resp distress. She has been on coumadin for afib as well as a recent mitral valve replacement. INR  is 2.09 today. Since she has multiple issue, will target for 2.5-3 in this case.  PTA coumadin = 3mg  qday except 2mg  MWF  Goal of Therapy:  INR = 2.5-3 Monitor platelets by anticoagulation protocol: Yes   Plan:   Coumadin 3 mg po x1 Daily INR  [name] [place] 08/20/2012,9:24 PM

## 2012-08-21 ENCOUNTER — Inpatient Hospital Stay (HOSPITAL_COMMUNITY): Payer: Medicare Other

## 2012-08-21 DIAGNOSIS — R627 Adult failure to thrive: Secondary | ICD-10-CM | POA: Diagnosis not present

## 2012-08-21 DIAGNOSIS — E860 Dehydration: Secondary | ICD-10-CM | POA: Diagnosis not present

## 2012-08-21 DIAGNOSIS — J189 Pneumonia, unspecified organism: Secondary | ICD-10-CM

## 2012-08-21 DIAGNOSIS — E876 Hypokalemia: Secondary | ICD-10-CM

## 2012-08-21 DIAGNOSIS — R918 Other nonspecific abnormal finding of lung field: Secondary | ICD-10-CM | POA: Diagnosis not present

## 2012-08-21 DIAGNOSIS — I4891 Unspecified atrial fibrillation: Secondary | ICD-10-CM | POA: Diagnosis not present

## 2012-08-21 LAB — URINALYSIS, ROUTINE W REFLEX MICROSCOPIC
Glucose, UA: NEGATIVE mg/dL
Protein, ur: NEGATIVE mg/dL
pH: 5.5 (ref 5.0–8.0)

## 2012-08-21 LAB — URINE MICROSCOPIC-ADD ON

## 2012-08-21 LAB — CBC WITH DIFFERENTIAL/PLATELET
Eosinophils Absolute: 0 10*3/uL (ref 0.0–0.7)
Lymphocytes Relative: 6 % — ABNORMAL LOW (ref 12–46)
Lymphs Abs: 0.7 10*3/uL (ref 0.7–4.0)
Neutrophils Relative %: 86 % — ABNORMAL HIGH (ref 43–77)
Platelets: 179 10*3/uL (ref 150–400)
RBC: 4.22 MIL/uL (ref 3.87–5.11)
WBC: 12.3 10*3/uL — ABNORMAL HIGH (ref 4.0–10.5)

## 2012-08-21 LAB — COMPREHENSIVE METABOLIC PANEL
CO2: 25 mEq/L (ref 19–32)
Calcium: 8.7 mg/dL (ref 8.4–10.5)
Creatinine, Ser: 0.56 mg/dL (ref 0.50–1.10)
GFR calc Af Amer: >90 mL/min (ref >90)
GFR calc non Af Amer: 83 mL/min — ABNORMAL LOW (ref >90)
Glucose, Bld: 103 mg/dL — ABNORMAL HIGH (ref 70–99)

## 2012-08-21 LAB — TSH: TSH: 1.481 u[IU]/mL (ref 0.350–4.500)

## 2012-08-21 LAB — MRSA PCR SCREENING: MRSA by PCR: NEGATIVE

## 2012-08-21 MED ORDER — LEVOFLOXACIN 500 MG PO TABS
500.0000 mg | ORAL_TABLET | ORAL | Status: DC
  Administered 2012-08-21 – 2012-08-23 (×2): 500 mg via ORAL
  Filled 2012-08-21 (×2): qty 1

## 2012-08-21 MED ORDER — MEGESTROL ACETATE 40 MG PO TABS: 40.0000 mg | ORAL_TABLET | Freq: Every day | ORAL | Status: DC

## 2012-08-21 MED ORDER — ENSURE PUDDING PO PUDG
1.0000 | Freq: Two times a day (BID) | ORAL | Status: DC
  Administered 2012-08-22 – 2012-08-24 (×4): 1 via ORAL
  Filled 2012-08-21 (×2): qty 1

## 2012-08-21 MED ORDER — WARFARIN SODIUM 3 MG PO TABS
3.0000 mg | ORAL_TABLET | Freq: Once | ORAL | Status: AC
  Administered 2012-08-21: 3 mg via ORAL
  Filled 2012-08-21: qty 1

## 2012-08-21 MED ORDER — POTASSIUM CHLORIDE 20 MEQ/15ML (10%) PO LIQD
40.0000 meq | Freq: Once | ORAL | Status: AC
  Administered 2012-08-21: 40 meq via ORAL
  Filled 2012-08-21: qty 30

## 2012-08-21 NOTE — Progress Notes (Signed)
ANTICOAGULATION CONSULT NOTE - Follow Up Consult  Pharmacy Consult for Coumadin Indication: Afib + MVR  Allergies  Allergen Reactions  . Promethazine Hcl Other (See Comments)    unknown  . Sulfonamide Derivatives Other (See Comments)    unknown  . Tequin Other (See Comments)    hallucinations     Patient Measurements: Height: 5' (152.4 cm) Weight: 85 lb 11.2 oz (38.873 kg) (bed) IBW/kg (Calculated) : 45.5  Heparin Dosing Weight:    Vital Signs: Temp: 98.5 F (36.9 C) (01/18 0522) Temp src: Oral (01/18 0522) BP: 100/46 mmHg (01/18 1000) Pulse Rate: 68  (01/18 1000)  Labs:  Basename 08/21/12 0720 08/20/12 2237 08/20/12 2040 08/20/12 1827 08/20/12 1241  HGB 11.5* -- -- -- 12.5  HCT 36.0 -- -- -- 39.7  PLT 179 -- -- -- 207  APTT -- -- -- -- --  LABPROT 24.6* -- 22.6* -- --  INR 2.34* -- 2.09* -- --  HEPARINUNFRC -- -- -- -- --  CREATININE 0.56 -- -- -- 0.65  CKTOTAL -- -- -- -- --  CKMB -- -- -- -- --  TROPONINI -- <0.30 -- <0.30 --    Estimated Creatinine Clearance: 32.1 ml/min (by C-G formula based on Cr of 0.56).  Assessment: SOB 79 yo admitted for resp distress. She has been on coumadin for afib as well as a recent mitral valve replacement. PTA coumadin = 3mg  TTSS except 2mg  MWF (admit INR 2.09)  Anticoagulation: INR 2.34. Since she has afib, MVR, CHF, will shoot for 2.5-3 goal. Dose of Coumadin from 1/7 not given. This should have been given IV if pt NPO.  Infectious Disease: Afebrile. WBC 12.3. Levaquin (renally adjusted) #1/5 for CAP  Cardiovascular: MVP, MVR, PAH, LBBB, PVC, afib, CHF, HTN, PPM. 100/46, HR 68 on metoprolol  Endocrinology: Prednisone for   Gastrointestinal / Nutrition: FTT on Docusate, Ensure, FA, Megace, Prostat  Neurology: Lexapro  Nephrology: Scr 0.56 but CrCl 32  Pulmonary: COPD on Claritin, Roflumilast  Hematology / Oncology: RA, CBC wnl  PTA Medication Issues  Best Practices: coumadin  Plan  Coumadin 3mg  PO x1 Daily  INR  Goal of Therapy:  INR 2.5-3 Monitor platelets by anticoagulation protocol: Yes   Plan:  Coumadin 3mg  po x 1 tonight.  Merilynn Finland, [employer] 08/21/2012,1:34 PM

## 2012-08-21 NOTE — Progress Notes (Signed)
[facility]             Progress Note   Subjective: In bed. Able to answer questions appropriately. Multiple family members presents and updated on plan of care.  Objective: Vital signs in last 24 hours: Temp:  [97.7 F (36.5 C)-98.7 F (37.1 C)] 98.5 F (36.9 C) (01/18 0522) Pulse Rate:  [70-116] 70  (01/18 0522) Resp:  [18-29] 20  (01/18 0522) BP: (92-115)/(41-63) 107/48 mmHg (01/18 0522) SpO2:  [94 %-99 %] 94 % (01/18 0522) Weight:  [38.873 kg (85 lb 11.2 oz)] 38.873 kg (85 lb 11.2 oz) (01/17 2209) Weight change:  Last BM Date: 08/21/12  Intake/Output from previous day: 01/17 0701 - 01/18 0700 In: 250 [IV Piggyback:250] Out: -  Total I/O In: -  Out: 200 [Urine:200]   Physical Exam: General: Alert, awake, oriented x3, cachectic. HEENT: No bruits, no goiter. Heart: irregular, not tachycardic, without murmurs, rubs, gallops. Lungs: Clear to auscultation bilaterally. Abdomen: Soft, nontender, nondistended, positive bowel sounds. Extremities: No clubbing cyanosis or edema with positive pedal pulses.    Lab Results: Basic Metabolic Panel:  Basename 08/21/12 0720 08/20/12 1241  NA 138 140  K 3.1* 3.8  CL 102 99  CO2 25 27  GLUCOSE 103* 142*  BUN 18 21  CREATININE 0.56 0.65  CALCIUM 8.7 9.8  MG -- --  PHOS -- --   Liver Function Tests:  Basename 08/21/12 0720 08/20/12 1241  AST 16 22  ALT 10 12  ALKPHOS 99 107  BILITOT 0.8 0.7  PROT 5.6* 6.3  ALBUMIN 2.3* 2.6*   CBC:  Basename 08/21/12 0720 08/20/12 1241  WBC 12.3* 16.0*  NEUTROABS 10.6* 14.3*  HGB 11.5* 12.5  HCT 36.0 39.7  MCV 85.3 85.9  PLT 179 207   Cardiac Enzymes:  Basename 08/20/12 2237 08/20/12 1827  CKTOTAL -- --  CKMB -- --  CKMBINDEX -- --  TROPONINI <0.30 <0.30   BNP:  Basename 08/20/12 2045  PROBNP 2567.0*   D-Dimer:  Basename 08/20/12 2237  DDIMER 2.00*   Coagulation:  Basename 08/21/12 0720 08/20/12 2040  LABPROT 24.6* 22.6*  INR 2.34* 2.09*    Urinalysis:  Basename 08/20/12 1720  COLORURINE YELLOW  LABSPEC 1.019  PHURINE 5.5  GLUCOSEU 100*  HGBUR NEGATIVE  BILIRUBINUR NEGATIVE  KETONESUR NEGATIVE  PROTEINUR NEGATIVE  UROBILINOGEN 0.2  NITRITE NEGATIVE  LEUKOCYTESUR SMALL*    Recent Results (from the past 240 hour(s))  MRSA PCR SCREENING     Status: Normal   Collection Time   08/20/12  9:59 PM      Component Value Range Status Comment   MRSA by PCR NEGATIVE  NEGATIVE Final     Studies/Results: Dg Chest Port 1 View  08/21/2012  *RADIOLOGY REPORT*  Clinical Data: Evaluate for pulmonary infiltrate  PORTABLE CHEST - 1 VIEW  Comparison: 08/20/2012; 06/23/2012; 05/14/2012  Findings: Grossly unchanged cardiac silhouette and mediastinal contours.  Post aortic valve replacement.  The left anterior chest wall dual lead pacemaker with lead tips overlying expected location of the right atrium and ventricle. The lungs remain hyperexpanded with flattening about the diaphragms.  There is chronic blunting of the bilateral costophrenic angles suggesting trace bilateral effusions  IMPRESSION: 1.  Unchanged left basilar/retrocardiac consolidative opacities, atelectasis versus infiltrate. Further evaluation with a PA and lateral chest radiograph may be obtained as clinically indicated. 2. Stable findings of lung hyperexpansion and chronic trace bilateral effusions.   Original Report Authenticated By: Tacey Ruiz, MD    Dg  Chest Port 1 View  08/20/2012  *RADIOLOGY REPORT*  Clinical Data: Respiratory distress.  PORTABLE CHEST - 1 VIEW  Comparison: 06/23/2012  Findings: Left pacer remains in place, unchanged.  Prior mitral valve replacement.  Heart is borderline in size.  Small left effusion with left base atelectasis.  Right lung is clear.  IMPRESSION: Small left effusion.  Left base atelectasis.   Original Report Authenticated By: Charlett Nose, M.D.     Medications: Scheduled Meds:    . docusate sodium  100 mg Oral BID  . escitalopram   20 mg Oral Daily  . feeding supplement  237 mL Oral TID WC  . fluticasone  1 spray Each Nare Daily  . folic acid  2 mg Oral Daily  . levofloxacin  500 mg Oral Q48H  . loratadine  10 mg Oral Daily  . megestrol  400 mg Oral Daily  . megestrol  40 mg Oral Daily  . metoprolol tartrate  50 mg Oral BID  . potassium chloride  40 mEq Oral Once  . predniSONE  2.5 mg Oral QODAY  . protein supplement  1 scoop Oral TID WC  . roflumilast  500 mcg Oral Daily  . sodium chloride  3 mL Intravenous Q12H  . sodium chloride  3 mL Intravenous Q12H  . warfarin  3 mg Oral Once  . Warfarin - Pharmacist Dosing Inpatient   Does not apply q1800   Continuous Infusions:  PRN Meds:.acetaminophen, acetaminophen, levalbuterol, nitroGLYCERIN, ondansetron (ZOFRAN) IV, ondansetron, traMADol  Assessment/Plan:  Principal Problem:  *SOB (shortness of breath) Active Problems:  A-fib  FTT (failure to thrive) in adult  Leucocytosis  CAP (community acquired pneumonia)  Hypokalemia   SOB -Resolved. -Could have been from her a fib with RVR. -Also PNA is not completely excluded on her CXR. -Will treat with levaquin. -Will also check influenza PCR.  Hypokalemia -Replete PO. -Check Mg level and replete as necessary.  FTT -Family reports lack of appetite and weight loss over the past 3 months. -Continue megace. -Check TSH/B12/RPR. -Consult PT/OT. -Family requesting speech therapy evaluation, but patient states she has a lack of appetite, not dysphagia or odynophagia.   Time spent coordinating care: 35 minutes.   LOS: 1 day   [facility] Pager: [phone] 08/21/2012, 12:03 PM

## 2012-08-21 NOTE — Progress Notes (Signed)
HELD MEDS DUE TO NPO.

## 2012-08-21 NOTE — Progress Notes (Signed)
Unable to do H and P with patient. She is sleepy and very weak.

## 2012-08-21 NOTE — Progress Notes (Signed)
INITIAL NUTRITION ASSESSMENT  DOCUMENTATION CODES Per approved criteria  -Severe malnutrition in the context of chronic illness -Underweight   INTERVENTION: 1. Continue Ensure Complete TID and Beneprotein 1 scoop TID 2. Will order Ensure pudding for lunch and dinner trays per patient preference.  3. RD will monitor per nutrition plan of care.  NUTRITION DIAGNOSIS: Inadequate oral intake related to decreased appetite as evidenced by minimal intake of meals.   Goal: Patient will meet >/=90% of estimated nutrition needs to prevent further weight loss.   Monitor:  PO intake of meals and supplements, weight, labs, I/Os  Reason for Assessment: Consult, assessment of nutrition status and requirements  79 y.o. female  Admitting Dx: SOB (shortness of breath)  ASSESSMENT: Patient is a resident of a nursing home. She ws admitted with shortness of breath and weakness. Per family, she has not been eating much, but will drink Ensure shakes. She has 22% weight loss over 2 months. This meets the criteria for severe malnutrition in the context of chronic illness. Per chart, possibility of a feeding tube has been discussed with patient at nursing home. She is currently on Megace and receiving Ensure Complete TID and Beneprotein 1 scoop TID. Family reports that she likes Ensure pudding.   Height: Ht Readings from Last 1 Encounters:  08/20/12 5' (1.524 m)    Weight: Wt Readings from Last 1 Encounters:  08/20/12 85 lb 11.2 oz (38.873 kg)    Ideal Body Weight: 45.5 kg  % Ideal Body Weight: 85%  Wt Readings from Last 10 Encounters:  08/20/12 85 lb 11.2 oz (38.873 kg)  08/10/12 84 lb 1.9 oz (38.157 kg)  07/05/12 93 lb (42.185 kg)  06/24/12 94 lb 14.4 oz (43.046 kg)  06/14/12 109 lb (49.442 kg)  06/11/12 110 lb (49.896 kg)  05/28/12 111 lb (50.349 kg)  05/28/12 111 lb (50.349 kg)  05/28/12 111 lb (50.349 kg)  05/17/12 115 lb (52.164 kg)    Usual Body Weight: 50 kg  % Usual Body  Weight: 78%  BMI:  Body mass index is 16.74 kg/(m^2). Patient is underweight.   Estimated Nutritional Needs: Kcal: 1075-1200 kcal Protein: 55-65 g Fluid: 1.2 L  Skin: Pressure ulcers, Stage 2 sacrum, stage 1 right heel  Diet Order: General  EDUCATION NEEDS: -No education needs identified at this time   Intake/Output Summary (Last 24 hours) at 08/21/12 1221 Last data filed at 08/21/12 0900  Gross per 24 hour  Intake    250 ml  Output    200 ml  Net     50 ml    Last BM: PTA   Labs:   Lab 08/21/12 0720 08/20/12 1241  NA 138 140  K 3.1* 3.8  CL 102 99  CO2 25 27  BUN 18 21  CREATININE 0.56 0.65  CALCIUM 8.7 9.8  MG -- --  PHOS -- --  GLUCOSE 103* 142*    CBG (last 3)  No results found for this basename: GLUCAP:3 in the last 72 hours  Scheduled Meds:    . docusate sodium  100 mg Oral BID  . escitalopram  20 mg Oral Daily  . feeding supplement  237 mL Oral TID WC  . fluticasone  1 spray Each Nare Daily  . folic acid  2 mg Oral Daily  . levofloxacin  500 mg Oral Q48H  . loratadine  10 mg Oral Daily  . megestrol  400 mg Oral Daily  . megestrol  40 mg Oral Daily  .  metoprolol tartrate  50 mg Oral BID  . potassium chloride  40 mEq Oral Once  . predniSONE  2.5 mg Oral QODAY  . protein supplement  1 scoop Oral TID WC  . roflumilast  500 mcg Oral Daily  . sodium chloride  3 mL Intravenous Q12H  . sodium chloride  3 mL Intravenous Q12H  . warfarin  3 mg Oral Once  . Warfarin - Pharmacist Dosing Inpatient   Does not apply q1800    Continuous Infusions:   Past Medical History  Diagnosis Date  . MVP (mitral valve prolapse)   . Pulmonary hypertension     MILD TO MODERATE BY ECHO  . History of chest pain   . Mitral regurgitation     MILD  . Asthma   . Rheumatoid arthritis   . Shortness of breath   . H/O: hysterectomy   . LBBB (left bundle branch block)     per discharge note 2003  . PVC (premature ventricular contraction)   . Atrial fibrillation     . Hypertension   . Heart murmur   . S/P mitral valve replacement with bioprosthetic valve 05/18/2012    27mm Edwards Magna Mitral pericardial bioprosthesis   . S/P Maze operation for atrial fibrillation 05/18/2012    Complete biatrial lesion set using cryothermy with oversewing of LA appendage  . COPD (chronic obstructive pulmonary disease)   . CHF (congestive heart failure)   . Pacemaker     Past Surgical History  Procedure Date  . Cholecystectomy   . Ovarian cyst removal   . Hysterectomy   . Appendectomy   . Cataract extraction   . Tee without cardioversion 02/09/2012    Procedure: TRANSESOPHAGEAL ECHOCARDIOGRAM (TEE);  Surgeon: Pricilla Riffle, MD;  Location: [facility] ENDOSCOPY;  Service: Cardiovascular;  Laterality: N/A;  . Maze 05/18/2012    Procedure: MAZE;  Surgeon: Purcell Nails, MD;  [facility];  Service: Open Heart Surgery;  Laterality: Right;  . Mitral valve replacement 05/18/2012    Procedure: MINIMALLY INVASIVE MITRAL VALVE (MV) REPLACEMENT;  Surgeon: Purcell Nails, MD;  Location: [facility] OR;  Service: Open Heart Surgery;  Laterality: N/A;  . Pacemaker insertion   . Abdominal hysterectomy     Linnell Fulling, RD, LDN Pager #: [phone] After-Hours Pager #: [phone]

## 2012-08-22 DIAGNOSIS — E46 Unspecified protein-calorie malnutrition: Secondary | ICD-10-CM

## 2012-08-22 DIAGNOSIS — I4891 Unspecified atrial fibrillation: Secondary | ICD-10-CM | POA: Diagnosis not present

## 2012-08-22 DIAGNOSIS — E860 Dehydration: Secondary | ICD-10-CM | POA: Diagnosis not present

## 2012-08-22 DIAGNOSIS — J189 Pneumonia, unspecified organism: Secondary | ICD-10-CM | POA: Diagnosis not present

## 2012-08-22 DIAGNOSIS — R627 Adult failure to thrive: Secondary | ICD-10-CM | POA: Diagnosis not present

## 2012-08-22 LAB — URINE CULTURE
Colony Count: NO GROWTH
Culture: NO GROWTH

## 2012-08-22 LAB — PROTIME-INR: Prothrombin Time: 27 seconds — ABNORMAL HIGH (ref 11.6–15.2)

## 2012-08-22 MED ORDER — PANTOPRAZOLE SODIUM 40 MG PO TBEC
40.0000 mg | DELAYED_RELEASE_TABLET | Freq: Every day | ORAL | Status: DC
  Administered 2012-08-22 – 2012-08-23 (×2): 40 mg via ORAL
  Filled 2012-08-22 (×3): qty 1

## 2012-08-22 MED ORDER — WARFARIN SODIUM 2 MG PO TABS
2.0000 mg | ORAL_TABLET | ORAL | Status: DC
  Filled 2012-08-22: qty 1

## 2012-08-22 MED ORDER — RESOURCE THICKENUP CLEAR PO POWD
ORAL | Status: DC | PRN
  Filled 2012-08-22: qty 125

## 2012-08-22 MED ORDER — WARFARIN SODIUM 3 MG PO TABS
3.0000 mg | ORAL_TABLET | ORAL | Status: DC
  Administered 2012-08-22: 3 mg via ORAL
  Filled 2012-08-22: qty 1

## 2012-08-22 NOTE — Progress Notes (Signed)
[facility]             Progress Note   Subjective: In bed. Able to answer questions appropriately. Says she feels a little better. Still has no appetite. This has been an ongoing issue for over 3 months.  Objective: Vital signs in last 24 hours: Temp:  [98 F (36.7 C)-98.4 F (36.9 C)] 98.4 F (36.9 C) (01/19 0449) Pulse Rate:  [70-88] 88  (01/19 0449) Resp:  [18-20] 18  (01/19 0449) BP: (93-123)/(47-69) 93/50 mmHg (01/19 0449) SpO2:  [97 %-98 %] 97 % (01/19 0449) Weight:  [38.329 kg (84 lb 8 oz)] 38.329 kg (84 lb 8 oz) (01/19 0449) Weight change: -0.544 kg (-1 lb 3.2 oz) Last BM Date: 08/21/12  Intake/Output from previous day: 01/18 0701 - 01/19 0700 In: 360 [P.O.:360] Out: 500 [Urine:500]     Physical Exam: General: Alert, awake, oriented x3, cachectic. HEENT: No bruits, no goiter. Heart: irregular, not tachycardic, without murmurs, rubs, gallops. Lungs: Clear to auscultation bilaterally. Abdomen: Soft, nontender, nondistended, positive bowel sounds. Extremities: No clubbing cyanosis or edema with positive pedal pulses.    Lab Results: Basic Metabolic Panel:  Basename 08/21/12 1246 08/21/12 0720 08/20/12 1241  NA -- 138 140  K -- 3.1* 3.8  CL -- 102 99  CO2 -- 25 27  GLUCOSE -- 103* 142*  BUN -- 18 21  CREATININE -- 0.56 0.65  CALCIUM -- 8.7 9.8  MG 1.9 -- --  PHOS -- -- --   Liver Function Tests:  Basename 08/21/12 0720 08/20/12 1241  AST 16 22  ALT 10 12  ALKPHOS 99 107  BILITOT 0.8 0.7  PROT 5.6* 6.3  ALBUMIN 2.3* 2.6*   CBC:  Basename 08/21/12 0720 08/20/12 1241  WBC 12.3* 16.0*  NEUTROABS 10.6* 14.3*  HGB 11.5* 12.5  HCT 36.0 39.7  MCV 85.3 85.9  PLT 179 207   Cardiac Enzymes:  Basename 08/20/12 2237 08/20/12 1827  CKTOTAL -- --  CKMB -- --  CKMBINDEX -- --  TROPONINI <0.30 <0.30   BNP:  Basename 08/20/12 2045  PROBNP 2567.0*   D-Dimer:  Basename 08/20/12 2237  DDIMER 2.00*   Coagulation:  Basename  08/22/12 0445 08/21/12 0720  LABPROT 27.0* 24.6*  INR 2.65* 2.34*   Urinalysis:  Basename 08/21/12 1645 08/20/12 1720  COLORURINE AMBER* YELLOW  LABSPEC 1.022 1.019  PHURINE 5.5 5.5  GLUCOSEU NEGATIVE 100*  HGBUR NEGATIVE NEGATIVE  BILIRUBINUR NEGATIVE NEGATIVE  KETONESUR NEGATIVE NEGATIVE  PROTEINUR NEGATIVE NEGATIVE  UROBILINOGEN 0.2 0.2  NITRITE POSITIVE* NEGATIVE  LEUKOCYTESUR SMALL* SMALL*    Recent Results (from the past 240 hour(s))  URINE CULTURE     Status: Normal   Collection Time   08/20/12  5:20 PM      Component Value Range Status Comment   Specimen Description URINE, CATHETERIZED   Final    Special Requests NONE   Final    Culture  Setup Time 08/20/2012 20:18   Final    Colony Count NO GROWTH   Final    Culture NO GROWTH   Final    Report Status 08/22/2012 FINAL   Final   MRSA PCR SCREENING     Status: Normal   Collection Time   08/20/12  9:59 PM      Component Value Range Status Comment   MRSA by PCR NEGATIVE  NEGATIVE Final     Studies/Results: Dg Chest Port 1 View  08/21/2012  *RADIOLOGY REPORT*  Clinical Data: Evaluate for pulmonary  infiltrate  PORTABLE CHEST - 1 VIEW  Comparison: 08/20/2012; 06/23/2012; 05/14/2012  Findings: Grossly unchanged cardiac silhouette and mediastinal contours.  Post aortic valve replacement.  The left anterior chest wall dual lead pacemaker with lead tips overlying expected location of the right atrium and ventricle. The lungs remain hyperexpanded with flattening about the diaphragms.  There is chronic blunting of the bilateral costophrenic angles suggesting trace bilateral effusions  IMPRESSION: 1.  Unchanged left basilar/retrocardiac consolidative opacities, atelectasis versus infiltrate. Further evaluation with a PA and lateral chest radiograph may be obtained as clinically indicated. 2. Stable findings of lung hyperexpansion and chronic trace bilateral effusions.   Original Report Authenticated By: Tacey Ruiz, MD    Dg Chest  Port 1 View  08/20/2012  *RADIOLOGY REPORT*  Clinical Data: Respiratory distress.  PORTABLE CHEST - 1 VIEW  Comparison: 06/23/2012  Findings: Left pacer remains in place, unchanged.  Prior mitral valve replacement.  Heart is borderline in size.  Small left effusion with left base atelectasis.  Right lung is clear.  IMPRESSION: Small left effusion.  Left base atelectasis.   Original Report Authenticated By: Charlett Nose, M.D.     Medications: Scheduled Meds:    . docusate sodium  100 mg Oral BID  . escitalopram  20 mg Oral Daily  . feeding supplement  237 mL Oral TID WC  . feeding supplement  1 Container Oral BID WC  . fluticasone  1 spray Each Nare Daily  . folic acid  2 mg Oral Daily  . levofloxacin  500 mg Oral Q48H  . loratadine  10 mg Oral Daily  . megestrol  400 mg Oral Daily  . metoprolol tartrate  50 mg Oral BID  . pantoprazole  40 mg Oral Daily  . predniSONE  2.5 mg Oral QODAY  . protein supplement  1 scoop Oral TID WC  . roflumilast  500 mcg Oral Daily  . sodium chloride  3 mL Intravenous Q12H  . sodium chloride  3 mL Intravenous Q12H  . warfarin  3 mg Oral Once  . Warfarin - Pharmacist Dosing Inpatient   Does not apply q1800   Continuous Infusions:  PRN Meds:.acetaminophen, acetaminophen, levalbuterol, nitroGLYCERIN, ondansetron (ZOFRAN) IV, ondansetron, traMADol  Assessment/Plan:  Principal Problem:  *SOB (shortness of breath) Active Problems:  A-fib  FTT (failure to thrive) in adult  Leucocytosis  CAP (community acquired pneumonia)  Hypokalemia  Protein calorie malnutrition   SOB -Resolved. -Could have been from her a fib with RVR. -Also PNA is not completely excluded on her CXR. -Will treat with levaquin. -Influenza PCR is negative.  Hypokalemia -Replete PO. -Check Mg level and replete as necessary. -Recheck BMET in am.  FTT/Protein-Caloric Malnutrition -Family reports lack of appetite and weight loss over the past 3 months. -Continue megace. -  TSH/B12/RPR are WNL. -Consult PT/OT. -Family requesting speech therapy evaluation, but patient states she has a lack of appetite, not dysphagia or odynophagia. -Will also ask for a nutrition eval. -MD at SNF had talked to them about a feeding tube. -Will need to address with family.   Time spent coordinating care: 35 minutes.   LOS: 2 days   [facility] Pager: [phone] 08/22/2012, 10:14 AM

## 2012-08-22 NOTE — Evaluation (Signed)
Clinical/Bedside Swallow Evaluation Patient Details  Name: Nancy Bush MRN: [id number] Date of Birth: 12-25-1925  Today's Date: 08/22/2012 Time: 0915-1000 SLP Time Calculation (min): 45 min  Past Medical History:  Past Medical History  Diagnosis Date  . MVP (mitral valve prolapse)   . Pulmonary hypertension     MILD TO MODERATE BY ECHO  . History of chest pain   . Mitral regurgitation     MILD  . Asthma   . Rheumatoid arthritis   . Shortness of breath   . H/O: hysterectomy   . LBBB (left bundle branch block)     per discharge note 2003  . PVC (premature ventricular contraction)   . Atrial fibrillation   . Hypertension   . Heart murmur   . S/P mitral valve replacement with bioprosthetic valve 05/18/2012    27mm Edwards Magna Mitral pericardial bioprosthesis   . S/P Maze operation for atrial fibrillation 05/18/2012    Complete biatrial lesion set using cryothermy with oversewing of LA appendage  . COPD (chronic obstructive pulmonary disease)   . CHF (congestive heart failure)   . Pacemaker    Past Surgical History:  Past Surgical History  Procedure Date  . Cholecystectomy   . Ovarian cyst removal   . Hysterectomy   . Appendectomy   . Cataract extraction   . Tee without cardioversion 02/09/2012    Procedure: TRANSESOPHAGEAL ECHOCARDIOGRAM (TEE);  Surgeon: Pricilla Riffle, MD;  Location: [facility] ENDOSCOPY;  Service: Cardiovascular;  Laterality: N/A;  . Maze 05/18/2012    Procedure: MAZE;  Surgeon: Purcell Nails, MD;  Location: [facility] OR;  Service: Open Heart Surgery;  Laterality: Right;  . Mitral valve replacement 05/18/2012    Procedure: MINIMALLY INVASIVE MITRAL VALVE (MV) REPLACEMENT;  Surgeon: Purcell Nails, MD;  Location: [facility] OR;  Service: Open Heart Surgery;  Laterality: N/A;  . Pacemaker insertion   . Abdominal hysterectomy    HPI:  79 year old female who has had a bioprosthetic mitral valve replaced and history of atrial fibrillation status post Maze procedure on  Coumadin was brought from nursing home after patient was found to be having increasing weakness and shortness of breath. On arrival in the ER patient was found to be in atrial fibrillation with RVR and was initially given fluid bolus normal saline after which heart rate has improved and presently controlled. Patient's chest x-ray showed left pleural effusion which is chronic but did not show any infiltrates. Blood work showed increased lactic acid level and also leukocytosis. Patient was afebrile. As per patient's family was at the bedside patient has had some productive cough for last few days and in the nursing home they had checked his sputum but was not started on any antibiotics. Patient also had some abdominal pain 2 weeks ago and x-rays did not reveal any acute changes as per family presently patient denies any abdominal pain nausea vomiting or diarrhea. Patient has not been eating well for last few weeks and the physicians at the nursing home it discussed about feeding tube with the patient. But yesterday a she did have a large meal as per family. At this time patient will be admitted for further management.  Patient referred for BSE to assess risk for aspiration.     Assessment / Plan / Recommendation Clinical Impression  Dysphagia indicated characterized by weakness and decreased functional reserve.  +s/s of aspiration s/p swallow of trial ice chips and thin water by cup due to delay in initiation.  Cough response judged  to weak and ineffective.  Mastication of solids extended with diffuse residue due to poor bolus cohesion.  Reduced management of solids mainly due to lack of saliva.  No attempts made by patient for self feeding as she stated she was too weak to hold a cup.   Recommend to downgrade to dysphagia 2 diet consistency with nectar thick liquids with full supervision with all meals.  Recommend to proceed with objective evaluation of MBS with completion of esophageal sweep to assess risk for  aspiration and determine safest, possible PO diet.  MBS to be completed on 08/23/13.      Aspiration Risk  Moderate    Diet Recommendation Dysphagia 2 (Fine chop);Nectar-thick liquid   Liquid Administration via: Cup;No straw Medication Administration: Whole meds with puree Supervision: Full supervision/cueing for compensatory strategies Postural Changes and/or Swallow Maneuvers: Seated upright 90 degrees;Upright 30-60 min after meal    Other  Recommendations Recommended Consults: MBS Oral Care Recommendations: Oral care QID Other Recommendations: Order thickener from pharmacy;Prohibited food (jello, ice cream, thin soups);Remove water pitcher;Clarify dietary restrictions   Follow Up Recommendations  Skilled Nursing facility    Frequency and Duration min 2x/week  2 weeks       SLP Swallow Goals Patient will consume recommended diet without observed clinical signs of aspiration with: Moderate assistance Patient will utilize recommended strategies during swallow to increase swallowing safety with: Moderate assistance   Swallow Study Prior Functional Status   Resident of LTC history of rapid weight loss    General Date of Onset: 08/20/12 HPI: 79 year old female who has had a bioprosthetic mitral valve replaced and history of atrial fibrillation status post Maze procedure on Coumadin was brought from nursing home after patient was found to be having increasing weakness and shortness of breath. On arrival in the ER patient was found to be in atrial fibrillation with RVR and was initially given fluid bolus normal saline after which heart rate has improved and presently controlled. Patient's chest x-ray showed left pleural effusion which is chronic but did not show any infiltrates. Blood work showed increased lactic acid level and also leukocytosis. Patient was afebrile. As per patient's family was at the bedside patient has had some productive cough for last few days and in the nursing home  they had checked his sputum but was not started on any antibiotics. Patient also had some abdominal pain 2 weeks ago and x-rays did not reveal any acute changes as per family presently patient denies any abdominal pain nausea vomiting or diarrhea. Patient has not been eating well for last few weeks and the physicians at the nursing home it discussed about feeding tube with the patient. But yesterday a she did have a large meal as per family. At this time patient will be admitted for further management. Type of Study: Bedside swallow evaluation Diet Prior to this Study: Regular;Thin liquids Temperature Spikes Noted: No Respiratory Status: Supplemental O2 delivered via (comment) History of Recent Intubation: No Behavior/Cognition: Cooperative;Pleasant mood;Distractible;Lethargic Oral Cavity - Dentition: Adequate natural dentition Self-Feeding Abilities: Total assist Patient Positioning: Upright in bed Baseline Vocal Quality: Hoarse;Low vocal intensity Volitional Cough: Wet;Weak Volitional Swallow: Able to elicit    Oral/Motor/Sensory Function Overall Oral Motor/Sensory Function: Appears within functional limits for tasks assessed   Ice Chips Ice chips: Impaired Presentation: Spoon Pharyngeal Phase Impairments: Suspected delayed Swallow;Decreased hyoid-laryngeal movement;Wet Vocal Quality;Cough - Delayed   Thin Liquid Thin Liquid: Impaired Presentation: Spoon;Cup Pharyngeal  Phase Impairments: Suspected delayed Swallow;Decreased hyoid-laryngeal movement;Cough - Delayed;Wet Vocal Quality  Nectar Thick Nectar Thick Liquid: Impaired Pharyngeal Phase Impairments: Suspected delayed Swallow;Decreased hyoid-laryngeal movement   Honey Thick Honey Thick Liquid: Not tested   Puree Puree: Impaired Pharyngeal Phase Impairments: Suspected delayed Swallow;Decreased hyoid-laryngeal movement   Solid   GO    Solid: Impaired Oral Phase Impairments: Reduced lingual movement/coordination Oral Phase  Functional Implications: Oral residue Pharyngeal Phase Impairments: Suspected delayed Swallow;Decreased hyoid-laryngeal movement      Moreen Fowler MS, CCC-SLP [phone] [facility] 08/22/2012,11:45 AM

## 2012-08-22 NOTE — Evaluation (Signed)
Physical Therapy Evaluation Patient Details Name: Nancy Bush MRN: [id number] DOB: [birth year] Today's Date: 08/22/2012 Time: 4098-1191 PT Time Calculation (min): 41 min  PT Assessment / Plan / Recommendation Clinical Impression  Patient is an 79 yo female admitted from SNF with SOB and weakness, Afib with RVR, FTT.  Patient with general weakness impacting mobility.  Will benefit from acute PT to maximize independence prior to discharge.  Recommend patient return to SNF for continued therapy.    PT Assessment  Patient needs continued PT services    Follow Up Recommendations  SNF    Does the patient have the potential to tolerate intense rehabilitation      Barriers to Discharge Decreased caregiver support      Equipment Recommendations  None recommended by PT    Recommendations for Other Services     Frequency Min 3X/week    Precautions / Restrictions Precautions Precautions: Fall Restrictions Weight Bearing Restrictions: No   Pertinent Vitals/Pain       Mobility  Bed Mobility Bed Mobility: Supine to Sit;Sitting - Scoot to Edge of Bed;Sit to Supine Supine to Sit: 4: Min assist;With rails;HOB elevated Sitting - Scoot to Edge of Bed: 4: Min assist Sit to Supine: 4: Min assist;HOB elevated Details for Bed Mobility Assistance: Verbal cues for technique.  Assist to raise trunk off of bed. Transfers Transfers: Sit to Stand;Stand to [employer] Transfers Sit to Stand: 4: Min assist;With upper extremity assist;From bed;With armrests;From chair/3-in-1 Stand to Sit: 4: Min assist;With upper extremity assist;With armrests;To chair/3-in-1;To bed Stand Pivot Transfers: 4: Min assist Details for Transfer Assistance: Verbal cues for technique and safety.  Assist to rise from sitting and for balance. Ambulation/Gait Ambulation/Gait Assistance: Not tested (comment)           PT Diagnosis: Difficulty walking;Generalized weakness  PT Problem List: Decreased  strength;Decreased activity tolerance;Decreased balance;Decreased mobility;Cardiopulmonary status limiting activity PT Treatment Interventions: DME instruction;Gait training;Functional mobility training;Therapeutic exercise;Balance training;Patient/family education   PT Goals Acute Rehab PT Goals PT Goal Formulation: With patient Time For Goal Achievement: 09/05/12 Potential to Achieve Goals: Fair Pt will go Supine/Side to Sit: with modified independence;with rail PT Goal: Supine/Side to Sit - Progress: Goal set today Pt will go Sit to Stand: with supervision;with upper extremity assist PT Goal: Sit to Stand - Progress: Goal set today Pt will Transfer Bed to Chair/Chair to Bed: with supervision PT Transfer Goal: Bed to Chair/Chair to Bed - Progress: Goal set today Pt will Ambulate: 51 - 150 feet;with supervision;with rolling walker PT Goal: Ambulate - Progress: Goal set today  Visit Information  Last PT Received On: 08/22/12 Assistance Needed: +1    Subjective Data  Subjective: "I am so weak.  I don't think I can get up" Patient Stated Goal: To get stronger   Prior Functioning  Home Living Lives With: Alone (From [facility]) Available Help at Discharge: Skilled Nursing Facility Home Adaptive Equipment: Walker - rolling Prior Function Level of Independence: Independent (prior to surgeries in October) Able to Take Stairs?: Yes Driving: Yes Vocation: Retired Comments: Information on PLOF is from October 2013, prior to surgeries. Communication Communication: No difficulties    Cognition  Overall Cognitive Status: Appears within functional limits for tasks assessed/performed Arousal/Alertness: Awake/alert Orientation Level: Appears intact for tasks assessed Behavior During Session: Flat affect    Extremity/Trunk Assessment Right Upper Extremity Assessment RUE ROM/Strength/Tone: Deficits RUE ROM/Strength/Tone Deficits: General weakness - 4-/5 RUE Sensation: WFL - Light  Touch Left Upper Extremity Assessment LUE ROM/Strength/Tone: Deficits  LUE ROM/Strength/Tone Deficits: General weakness - 4-/5 LUE Sensation: WFL - Light Touch Right Lower Extremity Assessment RLE ROM/Strength/Tone: Deficits RLE ROM/Strength/Tone Deficits: General weakness 4-/5 RLE Sensation: WFL - Light Touch Left Lower Extremity Assessment LLE ROM/Strength/Tone: Deficits LLE ROM/Strength/Tone Deficits: General weakness 4-/5 LLE Sensation: WFL - Light Touch   Balance Balance Balance Assessed: Yes Static Sitting Balance Static Sitting - Balance Support: Bilateral upper extremity supported;Feet supported Static Sitting - Level of Assistance: 5: Stand by assistance Static Sitting - Comment/# of Minutes: 7 Static Standing Balance Static Standing - Balance Support: Bilateral upper extremity supported Static Standing - Level of Assistance: 4: Min assist Static Standing - Comment/# of Minutes: Able to stand x 3 minutes for pericare after vioding in BSC.  End of Session PT - End of Session Equipment Utilized During Treatment: Gait belt Activity Tolerance: Patient limited by fatigue Patient left: in bed;with call bell/phone within reach;with family/visitor present Nurse Communication: Mobility status  GP     Vena Austria 08/22/2012, 4:30 PM Durenda Hurt. Renaldo Fiddler, [facility] Acute Rehab Services Pager [phone]

## 2012-08-22 NOTE — Progress Notes (Signed)
Pharmacist Heart Failure Core Measure Documentation  Assessment: Nancy Bush has an EF documented as 50-55% on 06/17/12 by Echo.  Rationale: Heart failure patients with left ventricular systolic dysfunction (LVSD) and an EF < 40% should be prescribed an angiotensin converting enzyme inhibitor (ACEI) or angiotensin receptor blocker (ARB) at discharge unless a contraindication is documented in the medical record.  This patient is not currently on an ACEI or ARB for HF.  This note is being placed in the record in order to provide documentation that a contraindication to the use of these agents is present for this encounter.  ACE Inhibitor or Angiotensin Receptor Blocker is contraindicated (specify all that apply)  []   ACEI allergy AND ARB allergy []   Angioedema []   Moderate or severe aortic stenosis []   Hyperkalemia []   Hypotension []   Renal artery stenosis [x]   Worsening renal function, preexisting renal disease or dysfunction   [name] 08/22/2012 12:55 PM

## 2012-08-22 NOTE — Progress Notes (Signed)
ANTICOAGULATION CONSULT NOTE - Follow Up Consult  Pharmacy Consult for Coumadin Indication: Afib + MVR  Allergies  Allergen Reactions  . Promethazine Hcl Other (See Comments)    unknown  . Sulfonamide Derivatives Other (See Comments)    unknown  . Tequin Other (See Comments)    hallucinations     Patient Measurements: Height: 5' (152.4 cm) Weight: 84 lb 8 oz (38.329 kg) IBW/kg (Calculated) : 45.5  Heparin Dosing Weight:    Vital Signs: Temp: 98.4 F (36.9 C) (01/19 0449) Temp src: Oral (01/19 0449) BP: 93/50 mmHg (01/19 0449) Pulse Rate: 88  (01/19 0449)  Labs:  Basename 08/22/12 0445 08/21/12 0720 08/20/12 2237 08/20/12 2040 08/20/12 1827 08/20/12 1241  HGB -- 11.5* -- -- -- 12.5  HCT -- 36.0 -- -- -- 39.7  PLT -- 179 -- -- -- 207  APTT -- -- -- -- -- --  LABPROT 27.0* 24.6* -- 22.6* -- --  INR 2.65* 2.34* -- 2.09* -- --  HEPARINUNFRC -- -- -- -- -- --  CREATININE -- 0.56 -- -- -- 0.65  CKTOTAL -- -- -- -- -- --  CKMB -- -- -- -- -- --  TROPONINI -- -- <0.30 -- <0.30 --    Estimated Creatinine Clearance: 31.7 ml/min (by C-G formula based on Cr of 0.56).  Assessment: SOB 79 yo admitted for resp distress. She has been on coumadin for afib as well as a recent mitral valve replacement. PTA coumadin = 3mg  TTSS except 2mg  MWF (admit INR 2.09)  Anticoagulation: INR 2.65. Since she has afib, MVR, CHF, will shoot for 2.5-3 goal.  Infectious Disease: Afebrile. WBC 12.3. Levaquin (renally adjusted) #2/5 for CAP  Cardiovascular: MVP, MVR, PAH, LBBB, PVC, afib, CHF, HTN, PPM. BP 93/50, HR 88 on metoprolol  Endocrinology: Prednisone   Gastrointestinal / Nutrition: FTT on Docusate, Ensure, FA, Megace, Prostat. No appetite.  Neurology: Lexapro  Nephrology: Scr 0.56 but CrCl 32  Pulmonary: COPD on Claritin, Roflumilast, prednison  Hematology / Oncology: RA, CBC wnl  PTA Medication Issues  Best Practices: coumadin  Goal of Therapy:  INR 2.5-3 Monitor platelets  by anticoagulation protocol: Yes   Plan:  Coumadin regimen 3mg  daily except 2mg  M,F  Misty Stanley Stillinger 08/22/2012,12:51 PM

## 2012-08-23 ENCOUNTER — Inpatient Hospital Stay (HOSPITAL_COMMUNITY): Payer: Medicare Other

## 2012-08-23 DIAGNOSIS — J189 Pneumonia, unspecified organism: Secondary | ICD-10-CM | POA: Diagnosis not present

## 2012-08-23 DIAGNOSIS — I4891 Unspecified atrial fibrillation: Secondary | ICD-10-CM | POA: Diagnosis not present

## 2012-08-23 LAB — CBC
HCT: 33.8 % — ABNORMAL LOW (ref 36.0–46.0)
MCH: 26.5 pg (ref 26.0–34.0)
MCV: 85.4 fL (ref 78.0–100.0)
Platelets: 170 10*3/uL (ref 150–400)
RBC: 3.96 MIL/uL (ref 3.87–5.11)

## 2012-08-23 LAB — BASIC METABOLIC PANEL
CO2: 24 mEq/L (ref 19–32)
Calcium: 9 mg/dL (ref 8.4–10.5)
Chloride: 105 mEq/L (ref 96–112)
Creatinine, Ser: 0.53 mg/dL (ref 0.50–1.10)
Glucose, Bld: 83 mg/dL (ref 70–99)
Sodium: 139 mEq/L (ref 135–145)

## 2012-08-23 NOTE — Clinical Social Work Psychosocial (Signed)
Clinical Social Work Department BRIEF PSYCHOSOCIAL ASSESSMENT 08/23/2012  Patient:  Nancy Bush, Nancy Bush     Account Number:  [id number]     Admit date:  08/20/2012  Clinical Social Worker: Frederico Hamman,  MSW, LCSW   Date/Time:   08/23/2012  Referred by:  RN  Date Referred:  08/23/12  Other Referral:  return to SNF Interview type:  Patient Other interview type:  Pt's 2 sisters at bedside   PSYCHOSOCIAL DATA Living Status:   Admitted from facility:  [facility] Level of care:  SNF Primary support name:  Nancy Bush and Nancy Bush  Primary support relationship to patient:  sisters Degree of support available:  strong support  CURRENT CONCERNS  Other Concerns:  Goals of care, end of life tx plans  SOCIAL WORK ASSESSMENT / PLAN  Assessment/plan status:  CSW was referred to Pt to assist with dc planning. CSW is familiar with Pt and her family from 2 previous admissions to her current unit.  Pt and her family have always chosen aggressive care, as Pt was living independently up until fall admission.  Pt has been in SNF since October. Pt's family is paying at a private pay rate currently since Pt has been refusing therapy.  During this admission, Pt has stated she has a poor appetite and does not feel like she can participate in therapy. When CSW entered the room, Pt was reading mail and paying bills with her sister. Pt's family recalls working with CSW and Pt recalled celebrating her birthday on the unit with cookie cake last admission. Pt was in good spirits with CSW but tired quickly after paying her bills. CSW spoke with Pt's other sister Nancy Bush regarding medicare benefits if Pt is refusing treatment. CSW discussed with Pt's sister the possibility of a Goals of Care meeting to discuss treatment options that Pt wants to participate in.  CSW discussed evaluation for palliative and hospice care at facility or home as well.  Pt's sister shared that Pt has been asking family to take her home from SNF.  CSW discussed private pay care givers with the addition of home health. Pt's family has paid for Pt's bed to be held through Wednesday morning.  Other assessment/ plan:  Updated MD. Goals of care can be done at SNF if timing is better.  Information/referral to community resources:  Offered resources for home care, Pt's family stated that they have information.   PATIENT'S/FAMILY'S RESPONSE TO PLAN OF CARE: Pt and her sister Nancy Bush worked on bills and read correspondences while CSW spoke with Nancy Bush. Nancy Bush was tearful at times when CSW discussed non-aggressive treatment options. Pt and her 2 sisters are the last living siblings of the 8 children in their family. Through each hospitalization, they have been close and supportive of each other.  Pt's sister Nancy Bush acknowledged Pt's decline over the last few months, stating that she thought the Pt was doing well right after surgery. CSW supported facility's comment in that they cannot make Pt participate.  Pt is frail, but still a full code at this time.    When CSW discussed Pt going home versus SNF, Pt's sister stated that it would be the "Pt's decision, of course."   CSW will f/u with treatment team and SNF to coordinate dc plans.   Frederico Hamman, LCSW [phone]

## 2012-08-23 NOTE — Procedures (Signed)
Objective Swallowing Evaluation: Modified Barium Swallowing Study  Patient Details  Name: Nancy Bush MRN: [id number] Date of Birth: 1925-11-20  Today's Date: 08/23/2012 Time: 0925-0940 SLP Time Calculation (min): 15 min  Past Medical History:  Past Medical History  Diagnosis Date  . MVP (mitral valve prolapse)   . Pulmonary hypertension     MILD TO MODERATE BY ECHO  . History of chest pain   . Mitral regurgitation     MILD  . Asthma   . Rheumatoid arthritis   . Shortness of breath   . H/O: hysterectomy   . LBBB (left bundle branch block)     per discharge note 2003  . PVC (premature ventricular contraction)   . Atrial fibrillation   . Hypertension   . Heart murmur   . S/P mitral valve replacement with bioprosthetic valve 05/18/2012    27mm Edwards Magna Mitral pericardial bioprosthesis   . S/P Maze operation for atrial fibrillation 05/18/2012    Complete biatrial lesion set using cryothermy with oversewing of LA appendage  . COPD (chronic obstructive pulmonary disease)   . CHF (congestive heart failure)   . Pacemaker    Past Surgical History:  Past Surgical History  Procedure Date  . Cholecystectomy   . Ovarian cyst removal   . Hysterectomy   . Appendectomy   . Cataract extraction   . Tee without cardioversion 02/09/2012    Procedure: TRANSESOPHAGEAL ECHOCARDIOGRAM (TEE);  Surgeon: Nancy Riffle, MD;  Location: [facility] ENDOSCOPY;  Service: Cardiovascular;  Laterality: N/A;  . Maze 05/18/2012    Procedure: MAZE;  Surgeon: Nancy Nails, MD;  Location: [facility] OR;  Service: Open Heart Surgery;  Laterality: Right;  . Mitral valve replacement 05/18/2012    Procedure: MINIMALLY INVASIVE MITRAL VALVE (MV) REPLACEMENT;  Surgeon: Nancy Nails, MD;  Location: [facility] OR;  Service: Open Heart Surgery;  Laterality: N/A;  . Pacemaker insertion   . Abdominal hysterectomy    HPI:  79 year old female who has had a bioprosthetic mitral valve replaced and history of atrial fibrillation  status post Maze procedure on Coumadin was brought from nursing home after patient was found to be having increasing weakness and shortness of breath. On arrival in the ER patient was found to be in atrial fibrillation with RVR and was initially given fluid bolus normal saline after which heart rate has improved and presently controlled. Patient's chest x-ray showed left pleural effusion which is chronic but did not show any infiltrates. Blood work showed increased lactic acid level and also leukocytosis. Patient was afebrile. As per patient's family was at the bedside patient has had some productive cough for last few days and in the nursing home they had checked his sputum but was not started on any antibiotics.  Patient has not been eating well for last few weeks and the physicians at the nursing home it discussed about feeding tube with the patient. But yesterday a she did have a large meal as per family.  Bedside swallow assessment recommended MBS.     Assessment / Plan / Recommendation Clinical Impression  Dysphagia Diagnosis: Moderate oral phase dysphagia;Mild pharyngeal phase dysphagia Clinical impression: Pt. weak with tremors during MBS.  She exhibited moderate oral dysphagia with sensory-motor impairments including weak manipulation, lingual pumping and delayed transit.  Mild motor based pharyngeal phase dysphagia reveals intermittent delayed swallow initiation to valleculae with mildly decreased laryngeal elevation and closure resulting in penetration of thin and nectar thick barium.  Penetration with nectar was flash (in  and out of vestibule), however straw sip of thin remained on anterior wall without sensation.  Decreased oral prep, manipulation and transit with solid texture.  Esophageal scan showed what appeared to be mildly delayed esophgeal emptying through GE junction.  Prognosis for return to thin liquids is good as overall strength and endurance improve.  Recommend she continue with Dys 2  diet and nectar thick liquids.  ST will continue to follow for diet/safety tolerance and progression when appropriate.     Treatment Recommendation  Therapy as outlined in treatment plan below    Diet Recommendation Dysphagia 2 (Fine chop);Nectar-thick liquid   Liquid Administration via: Cup;No straw Medication Administration: Whole meds with puree Supervision: Full supervision/cueing for compensatory strategies Compensations: Slow rate;Small sips/bites Postural Changes and/or Swallow Maneuvers: Seated upright 90 degrees    Other  Recommendations Oral Care Recommendations: Oral care BID   Follow Up Recommendations  Skilled Nursing facility    Frequency and Duration min 2x/week  2 weeks        SLP Swallow Goals Patient will consume recommended diet without observed clinical signs of aspiration with: Moderate assistance Swallow Study Goal #1 - Progress: Progressing toward goal Patient will utilize recommended strategies during swallow to increase swallowing safety with: Moderate assistance Swallow Study Goal #2 - Progress: Progressing toward goal      Reason for Referral Objectively evaluate swallowing function   Oral Phase Oral Preparation/Oral Phase Oral Phase: Impaired Oral - Honey Oral - Honey Teaspoon: Lingual pumping;Weak lingual manipulation;Delayed oral transit Oral - Nectar Oral - Nectar Teaspoon: Lingual pumping;Weak lingual manipulation;Delayed oral transit Oral - Nectar Cup: Lingual pumping;Weak lingual manipulation;Delayed oral transit Oral - Thin Oral - Thin Teaspoon: Lingual pumping;Weak lingual manipulation;Delayed oral transit Oral - Thin Cup: Lingual pumping;Weak lingual manipulation;Delayed oral transit Oral - Solids Oral - Puree: Lingual pumping;Weak lingual manipulation;Delayed oral transit Oral - Regular: Delayed oral transit;Weak lingual manipulation;Impaired mastication;Lingual pumping   Pharyngeal Phase Pharyngeal Phase Pharyngeal Phase:  Impaired Pharyngeal - Honey Pharyngeal - Honey Teaspoon: Delayed swallow initiation;Premature spillage to valleculae Pharyngeal - Honey Cup: Delayed swallow initiation;Premature spillage to valleculae Pharyngeal - Nectar Pharyngeal - Nectar Teaspoon: Delayed swallow initiation;Premature spillage to valleculae Pharyngeal - Nectar Cup: Penetration/Aspiration during swallow;Reduced laryngeal elevation;Reduced airway/laryngeal closure Penetration/Aspiration details (nectar cup): Material enters airway, remains ABOVE vocal cords then ejected out Pharyngeal - Thin Pharyngeal - Thin Teaspoon: Penetration/Aspiration during swallow;Delayed swallow initiation;Premature spillage to valleculae;Reduced airway/laryngeal closure;Reduced laryngeal elevation Penetration/Aspiration details (thin teaspoon): Material enters airway, remains ABOVE vocal cords then ejected out Pharyngeal - Thin Cup: Penetration/Aspiration during swallow;Reduced laryngeal elevation;Reduced airway/laryngeal closure Penetration/Aspiration details (thin cup): Material enters airway, remains ABOVE vocal cords then ejected out Pharyngeal - Thin Straw: Penetration/Aspiration during swallow Penetration/Aspiration details (thin straw): Material enters airway, remains ABOVE vocal cords and not ejected out Pharyngeal - Solids Pharyngeal - Puree: Delayed swallow initiation;Premature spillage to valleculae Pharyngeal - Regular: Within functional limits  Cervical Esophageal Phase        Cervical Esophageal Phase Cervical Esophageal Phase: [facility]         Darrow Bussing.[employer] [phone]  08/23/2012

## 2012-08-23 NOTE — Progress Notes (Signed)
Speech Language Pathology  Patient Details Name: LARUEN RISSER MRN: [id number] DOB: 02-Dec-1925 Today's Date: 08/23/2012 Time:  -    MBS scheduled for this morning at 0930.       Breck Coons [place].[employer] [phone]  08/23/2012

## 2012-08-23 NOTE — Progress Notes (Signed)
Physical Therapy Treatment Patient Details Name: Nancy Bush MRN: [id number] DOB: 1925/08/11 Today's Date: 08/23/2012 Time: 4098-1191 PT Time Calculation (min): 10 min  PT Assessment / Plan / Recommendation Comments on Treatment Session  Pt s/p SOB with decr mobility secondary to poor endurance.  Pt too fatigued to ambulate and would only agree to transfer back to bed from chair.  Very fatigued after trasnfer falling asleep before PT left the room.      Follow Up Recommendations  SNF;Supervision/Assistance - 24 hour                 Equipment Recommendations  None recommended by PT       Frequency Min 3X/week   Plan Discharge plan remains appropriate;Frequency remains appropriate    Precautions / Restrictions Precautions Precautions: Fall Restrictions Weight Bearing Restrictions: No   Pertinent Vitals/Pain VSS, No pain    Mobility  Bed Mobility Bed Mobility: Sit to Supine Supine to Sit: Not tested (comment) Sitting - Scoot to Edge of Bed: Not tested (comment) Sit to Supine: 3: Mod assist;HOB flat Details for Bed Mobility Assistance: Needed mod assist to get LEs into bed. Transfers Transfers: Sit to Stand;Stand to [employer] Transfers Sit to Stand: 4: Min assist;With upper extremity assist;With armrests;From chair/3-in-1 Stand to Sit: 4: Min assist;With upper extremity assist;To bed Stand Pivot Transfers: 4: Min assist Details for Transfer Assistance: verbal cues for technique and safety.  Assist to raise up from the chair for balance.  Pt able to take pivotal steps around withh HHA of 1.   Ambulation/Gait Ambulation/Gait Assistance: Not tested (comment) Stairs: No Wheelchair Mobility Wheelchair Mobility: No    Exercises General Exercises - Lower Extremity Long Arc Quad: AROM;Both;10 reps;Seated Heel Slides: AROM;Both;10 reps;Supine   PT Goals Acute Rehab PT Goals PT Goal: Supine/Side to Sit - Progress: Progressing toward goal PT Goal: Sit to Stand  - Progress: Progressing toward goal PT Transfer Goal: Bed to Chair/Chair to Bed - Progress: Progressing toward goal  Visit Information  Last PT Received On: 08/23/12 Assistance Needed: +1    Subjective Data  Subjective: "I just need to get back to bed."   Cognition  Overall Cognitive Status: Appears within functional limits for tasks assessed/performed Arousal/Alertness: Awake/alert Orientation Level: Appears intact for tasks assessed Behavior During Session: Flat affect    Balance  Static Sitting Balance Static Sitting - Balance Support: Bilateral upper extremity supported;Feet supported Static Sitting - Level of Assistance: 5: Stand by assistance Static Sitting - Comment/# of Minutes: 5 minutes  Static Standing Balance Static Standing - Balance Support: Bilateral upper extremity supported;During functional activity Static Standing - Level of Assistance: 4: Min assist Static Standing - Comment/# of Minutes: Can stand statically with min assist for steadying.  End of Session PT - End of Session Equipment Utilized During Treatment: Gait belt;Oxygen Activity Tolerance: Patient limited by fatigue Patient left: with call bell/phone within reach;in bed;with bed alarm set Nurse Communication: Mobility status        INGOLD,Shardea Cwynar 08/23/2012, 3:52 PM  [facility] Acute [facility] [phone] (pager)

## 2012-08-23 NOTE — Progress Notes (Signed)
[facility]             Progress Note   Subjective: In bed. Able to answer questions appropriately. Says she feels a little better. Still has no appetite. This has been an ongoing issue for over 3 months. For swallow test this am.  Objective: Vital signs in last 24 hours: Temp:  [97.6 F (36.4 C)-98 F (36.7 C)] 97.6 F (36.4 C) (01/20 0410) Pulse Rate:  [84-94] 94  (01/20 1052) Resp:  [16-18] 18  (01/20 0410) BP: (87-123)/(45-72) 87/48 mmHg (01/20 1052) SpO2:  [95 %-97 %] 95 % (01/20 0410) Weight:  [38.011 kg (83 lb 12.8 oz)] 38.011 kg (83 lb 12.8 oz) (01/20 0547) Weight change: -0.318 kg (-11.2 oz) Last BM Date: 08/21/12  Intake/Output from previous day: 01/19 0701 - 01/20 0700 In: 240 [P.O.:240] Out: 900 [Urine:900] Total I/O In: 60 [P.O.:60] Out: 102 [Urine:100; Stool:2]   Physical Exam: General: Alert, awake, oriented x3, cachectic. HEENT: No bruits, no goiter. Heart: irregular, not tachycardic, without murmurs, rubs, gallops. Lungs: Clear to auscultation bilaterally. Abdomen: Soft, nontender, nondistended, positive bowel sounds. Extremities: No clubbing cyanosis or edema with positive pedal pulses.    Lab Results: Basic Metabolic Panel:  Basename 08/23/12 0615 08/21/12 1246 08/21/12 0720  NA 139 -- 138  K 4.1 -- 3.1*  CL 105 -- 102  CO2 24 -- 25  GLUCOSE 83 -- 103*  BUN 16 -- 18  CREATININE 0.53 -- 0.56  CALCIUM 9.0 -- 8.7  MG -- 1.9 --  PHOS -- -- --   Liver Function Tests:  [facility] 08/21/12 0720  AST 16  ALT 10  ALKPHOS 99  BILITOT 0.8  PROT 5.6*  ALBUMIN 2.3*   CBC:  Basename 08/23/12 0615 08/21/12 0720  WBC 9.8 12.3*  NEUTROABS -- 10.6*  HGB 10.5* 11.5*  HCT 33.8* 36.0  MCV 85.4 85.3  PLT 170 179   Cardiac Enzymes:  Basename 08/20/12 2237 08/20/12 1827  CKTOTAL -- --  CKMB -- --  CKMBINDEX -- --  TROPONINI <0.30 <0.30   BNP:  Basename 08/20/12 2045  PROBNP 2567.0*   D-Dimer:  Basename 08/20/12 2237    DDIMER 2.00*   Coagulation:  Basename 08/23/12 0615 08/22/12 0445  LABPROT 32.2* 27.0*  INR 3.37* 2.65*   Urinalysis:  Basename 08/21/12 1645 08/20/12 1720  COLORURINE AMBER* YELLOW  LABSPEC 1.022 1.019  PHURINE 5.5 5.5  GLUCOSEU NEGATIVE 100*  HGBUR NEGATIVE NEGATIVE  BILIRUBINUR NEGATIVE NEGATIVE  KETONESUR NEGATIVE NEGATIVE  PROTEINUR NEGATIVE NEGATIVE  UROBILINOGEN 0.2 0.2  NITRITE POSITIVE* NEGATIVE  LEUKOCYTESUR SMALL* SMALL*    Recent Results (from the past 240 hour(s))  URINE CULTURE     Status: Normal   Collection Time   08/20/12  5:20 PM      Component Value Range Status Comment   Specimen Description URINE, CATHETERIZED   Final    Special Requests NONE   Final    Culture  Setup Time 08/20/2012 20:18   Final    Colony Count NO GROWTH   Final    Culture NO GROWTH   Final    Report Status 08/22/2012 FINAL   Final   CULTURE, BLOOD (ROUTINE X 2)     Status: Normal (Preliminary result)   Collection Time   08/20/12  7:30 PM      Component Value Range Status Comment   Specimen Description BLOOD RIGHT ARM   Final    Special Requests BOTTLES DRAWN AEROBIC AND ANAEROBIC 10CC  Final    Culture  Setup Time 08/21/2012 01:27   Final    Culture     Final    Value:        BLOOD CULTURE RECEIVED NO GROWTH TO DATE CULTURE WILL BE HELD FOR 5 DAYS BEFORE ISSUING A FINAL NEGATIVE REPORT   Report Status PENDING   Incomplete   CULTURE, BLOOD (ROUTINE X 2)     Status: Normal (Preliminary result)   Collection Time   08/20/12  8:00 PM      Component Value Range Status Comment   Specimen Description BLOOD RIGHT HAND   Final    Special Requests BOTTLES DRAWN AEROBIC AND ANAEROBIC   Final    Culture  Setup Time 08/21/2012 01:27   Final    Culture     Final    Value:        BLOOD CULTURE RECEIVED NO GROWTH TO DATE CULTURE WILL BE HELD FOR 5 DAYS BEFORE ISSUING A FINAL NEGATIVE REPORT   Report Status PENDING   Incomplete   MRSA PCR SCREENING     Status: Normal   Collection  Time   08/20/12  9:59 PM      Component Value Range Status Comment   MRSA by PCR NEGATIVE  NEGATIVE Final   URINE CULTURE     Status: Normal (Preliminary result)   Collection Time   08/21/12  4:45 PM      Component Value Range Status Comment   Specimen Description URINE, CLEAN CATCH   Final    Special Requests NONE   Final    Culture  Setup Time 08/22/2012 02:55   Final    Colony Count >=100,000 COLONIES/ML   Final    Culture ESCHERICHIA COLI   Final    Report Status PENDING   Incomplete     Studies/Results: Dg Swallowing Func-speech Pathology  08/23/2012  Breck Coons Adams, CCC-SLP     08/23/2012 10:16 AM Objective Swallowing Evaluation: Modified Barium Swallowing Study   Patient Details  Name: Nancy Bush MRN: [id number] Date of Birth: 01-Apr-1946  Today's Date: 08/23/2012 Time: 1308-6578 SLP Time Calculation (min): 15 min  Past Medical History:  Past Medical History  Diagnosis Date  . MVP (mitral valve prolapse)   . Pulmonary hypertension     MILD TO MODERATE BY ECHO  . History of chest pain   . Mitral regurgitation     MILD  . Asthma   . Rheumatoid arthritis   . Shortness of breath   . H/O: hysterectomy   . LBBB (left bundle branch block)     per discharge note 2003  . PVC (premature ventricular contraction)   . Atrial fibrillation   . Hypertension   . Heart murmur   . S/P mitral valve replacement with bioprosthetic valve  05/18/2012    27mm Edwards Magna Mitral pericardial bioprosthesis   . S/P Maze operation for atrial fibrillation 05/18/2012    Complete biatrial lesion set 77 using cryothermy with oversewing  of LA appendage  . COPD (chronic obstructive pulmonary disease)   . CHF (congestive heart failure)   . Pacemaker    Past Surgical History:  Past Surgical History  Procedure Date  . Cholecystectomy   . Ovarian cyst removal   . Hysterectomy   . Appendectomy   . Cataract extraction   . Tee without cardioversion 02/09/2012    Procedure: TRANSESOPHAGEAL ECHOCARDIOGRAM (TEE);  Surgeon:  Pricilla Riffle, MD;  Location: [facility] ENDOSCOPY;  Service:  Cardiovascular;  Laterality: N/A;  . Maze 05/18/2012    Procedure: MAZE;  Surgeon: Purcell Nails, MD;  Location: [facility]  OR;  Service: Open Heart Surgery;  Laterality: Right;  . Mitral valve replacement 05/18/2012    Procedure: MINIMALLY INVASIVE MITRAL VALVE (MV) REPLACEMENT;   Surgeon: Purcell Nails, MD;  Location: [facility] OR;  Service: Open  Heart Surgery;  Laterality: N/A;  . Pacemaker insertion   . Abdominal hysterectomy    HPI:  79 year old female who has had a bioprosthetic mitral valve  replaced and history of atrial fibrillation status post Maze  procedure on Coumadin was brought from nursing home after patient  was found to be having increasing weakness and shortness of  breath. On arrival in the ER patient was found to be in atrial  fibrillation with RVR and was initially given fluid bolus normal  saline after which heart rate has improved and presently  controlled. Patient's chest x-ray showed left pleural effusion  which is chronic but did not show any infiltrates. Blood work  showed increased lactic acid level and also leukocytosis. Patient  was afebrile. As per patient's family was at the bedside patient  has had some productive cough for last few days and in the  nursing home they had checked his sputum but was not started on  any antibiotics.  Patient has not been eating well for last few  weeks and the physicians at the nursing home it discussed about  feeding tube with the patient. But yesterday a she did have a  large meal as per family.  Bedside swallow assessment recommended  MBS.     Assessment / Plan / Recommendation Clinical Impression  Dysphagia Diagnosis: Moderate oral phase dysphagia;Mild  pharyngeal phase dysphagia Clinical impression: Pt. weak with tremors during MBS.  She  exhibited moderate oral dysphagia with sensory-motor impairments  including weak manipulation, lingual pumping and delayed transit.   Mild motor based pharyngeal phase  dysphagia reveals intermittent  delayed swallow initiation to valleculae with mildly decreased  laryngeal elevation and closure resulting in penetration of thin  and nectar thick barium.  Penetration with nectar was flash (in  and out of vestibule), however straw sip of thin remained on  anterior wall without sensation.  Decreased oral prep,  manipulation and transit with solid texture.  Esophageal scan  showed what appeared to be mildly delayed esophgeal emptying  through GE junction.  Prognosis for return to thin liquids is  good as overall strength and endurance improve.  Recommend she  continue with Dys 2 diet and nectar thick liquids.  ST will  continue to follow for diet/safety tolerance and progression when  appropriate.     Treatment Recommendation  Therapy as outlined in treatment plan below    Diet Recommendation Dysphagia 2 (Fine chop);Nectar-thick liquid   Liquid Administration via: Cup;No straw Medication Administration: Whole meds with puree Supervision: Full supervision/cueing for compensatory strategies Compensations: Slow rate;Small sips/bites Postural Changes and/or Swallow Maneuvers: Seated upright 90  degrees    Other  Recommendations Oral Care Recommendations: Oral care BID   Follow Up Recommendations  Skilled Nursing facility    Frequency and Duration min 2x/week  2 weeks        SLP Swallow Goals Patient will consume recommended diet without observed clinical  signs of aspiration with: Moderate assistance Swallow Study Goal #1 - Progress: Progressing toward goal Patient will utilize recommended strategies during swallow to  increase swallowing safety with: Moderate assistance Swallow Study Goal #2 - Progress:  Progressing toward goal      Reason for Referral Objectively evaluate swallowing function   Oral Phase Oral Preparation/Oral Phase Oral Phase: Impaired Oral - Honey Oral - Honey Teaspoon: Lingual pumping;Weak lingual  manipulation;Delayed oral transit Oral - Nectar Oral - Nectar Teaspoon:  Lingual pumping;Weak lingual  manipulation;Delayed oral transit Oral - Nectar Cup: Lingual pumping;Weak lingual  manipulation;Delayed oral transit Oral - Thin Oral - Thin Teaspoon: Lingual pumping;Weak lingual  manipulation;Delayed oral transit Oral - Thin Cup: Lingual pumping;Weak lingual  manipulation;Delayed oral transit Oral - Solids Oral - Puree: Lingual pumping;Weak lingual manipulation;Delayed  oral transit Oral - Regular: Delayed oral transit;Weak lingual  manipulation;Impaired mastication;Lingual pumping   Pharyngeal Phase Pharyngeal Phase Pharyngeal Phase: Impaired Pharyngeal - Honey Pharyngeal - Honey Teaspoon: Delayed swallow initiation;Premature  spillage to valleculae Pharyngeal - Honey Cup: Delayed swallow initiation;Premature  spillage to valleculae Pharyngeal - Nectar Pharyngeal - Nectar Teaspoon: Delayed swallow  initiation;Premature spillage to valleculae Pharyngeal - Nectar Cup: Penetration/Aspiration during  swallow;Reduced laryngeal elevation;Reduced airway/laryngeal  closure Penetration/Aspiration details (nectar cup): Material enters  airway, remains ABOVE vocal cords then ejected out Pharyngeal - Thin Pharyngeal - Thin Teaspoon: Penetration/Aspiration during  swallow;Delayed swallow initiation;Premature spillage to  valleculae;Reduced airway/laryngeal closure;Reduced laryngeal  elevation Penetration/Aspiration details (thin teaspoon): Material enters  airway, remains ABOVE vocal cords then ejected out Pharyngeal - Thin Cup: Penetration/Aspiration during  swallow;Reduced laryngeal elevation;Reduced airway/laryngeal  closure Penetration/Aspiration details (thin cup): Material enters  airway, remains ABOVE vocal cords then ejected out Pharyngeal - Thin Straw: Penetration/Aspiration during swallow Penetration/Aspiration details (thin straw): Material enters  airway, remains ABOVE vocal cords and not ejected out Pharyngeal - Solids Pharyngeal - Puree: Delayed swallow initiation;Premature spillage   to valleculae Pharyngeal - Regular: Within functional limits  Cervical Esophageal Phase        Cervical Esophageal Phase Cervical Esophageal Phase: [facility]         Darrow Bussing.Ed CCC-SLP Pager [phone]  08/23/2012     Medications: Scheduled Meds:    . docusate sodium  100 mg Oral BID  . escitalopram  20 mg Oral Daily  . feeding supplement  237 mL Oral TID WC  . feeding supplement  1 Container Oral BID WC  . fluticasone  1 spray Each Nare Daily  . folic acid  2 mg Oral Daily  . levofloxacin  500 mg Oral Q48H  . loratadine  10 mg Oral Daily  . megestrol  400 mg Oral Daily  . metoprolol tartrate  50 mg Oral BID  . pantoprazole  40 mg Oral Daily  . predniSONE  2.5 mg Oral QODAY  . roflumilast  500 mcg Oral Daily  . sodium chloride  3 mL Intravenous Q12H  . sodium chloride  3 mL Intravenous Q12H  . Warfarin - Pharmacist Dosing Inpatient   Does not apply q1800   Continuous Infusions:  PRN Meds:.acetaminophen, acetaminophen, levalbuterol, nitroGLYCERIN, ondansetron (ZOFRAN) IV, ondansetron, RESOURCE THICKENUP CLEAR, traMADol  Assessment/Plan:  Principal Problem:  *SOB (shortness of breath) Active Problems:  A-fib  FTT (failure to thrive) in adult  Leucocytosis  CAP (community acquired pneumonia)  Hypokalemia  Protein calorie malnutrition   SOB -Resolved. -Could have been from her a fib with RVR. -Also PNA is not completely excluded on her CXR. -Will treat with levaquin. -Influenza PCR is negative.  Hypokalemia -Repleted.  FTT/Protein-Caloric Malnutrition -Family reports lack of appetite and weight loss over the past 3 months. -Continue megace. - TSH/B12/RPR are WNL. -Consult PT/OT. -Will also ask for a nutrition  eval. -MD at SNF had talked to them about a feeding tube.  Disposition -DC back to SNF likely in am.   Time spent coordinating care: 35 minutes.   LOS: 3 days   HERNANDEZ ACOSTA,Tiawana Forgy [facility] Pager: [phone] 08/23/2012, 1:24  PM

## 2012-08-23 NOTE — Progress Notes (Signed)
NUTRITION CONSULT/FOLLOW UP  DOCUMENTATION CODES  Per approved criteria   -Severe malnutrition in the context of chronic illness  -Underweight    Intervention:    Continue Ensure Complete 3 times daily & Ensure Pudding supplement with lunch and dinner  If PO intake does not improve, need to consider/discuss EN support initiation  RD to follow for nutrition care plan  Nutrition Dx:   Inadequate oral intake related to poor appetite as evidenced by PO intake 5%, ongoing  Goal:   Oral intake with meals & supplements to meet >/= 90% of estimated nutrition needs, unmet  Monitor:   PO & supplemental intake, weight, labs, I/O's  Assessment:   RD consult for assessment of nutrition requirements/status.  Initial nutrition assessment completed 1/18.  Patient s/p bedside swallow evaluation 1/19; MBSS this AM ---> SLP recommending Dysphagia 2, nectar-thick liquid diet.  RD spoke with family at bedside ---> report patient's appetite remains poor, however, taking some supplements.  Height: Ht Readings from Last 1 Encounters:  08/20/12 5' (1.524 m)    Weight Status:   Wt Readings from Last 1 Encounters:  08/23/12 83 lb 12.8 oz (38.011 kg)    Re-estimated needs:  Kcal: 1075-1200 Protein: 55-65 gm Fluid: > 1.5 L  Skin: pressure ulcers, Stage 2 sacrum, stage 1 right heel  Diet Order: Dysphagia 2, nectar-thick liquids   Intake/Output Summary (Last 24 hours) at 08/23/12 1150 Last data filed at 08/23/12 1102  Gross per 24 hour  Intake    300 ml  Output    900 ml  Net   -600 ml    Last BM: 1/18  Labs:   Lab 08/23/12 0615 08/21/12 1246 08/21/12 0720 08/20/12 1241  NA 139 -- 138 140  K 4.1 -- 3.1* 3.8  CL 105 -- 102 99  CO2 24 -- 25 27  BUN 16 -- 18 21  CREATININE 0.53 -- 0.56 0.65  CALCIUM 9.0 -- 8.7 9.8  MG -- 1.9 -- --  PHOS -- -- -- --  GLUCOSE 83 -- 103* 142*     Scheduled Meds:   . docusate sodium  100 mg Oral BID  . escitalopram  20 mg Oral Daily  .  feeding supplement  237 mL Oral TID WC  . feeding supplement  1 Container Oral BID WC  . fluticasone  1 spray Each Nare Daily  . folic acid  2 mg Oral Daily  . levofloxacin  500 mg Oral Q48H  . loratadine  10 mg Oral Daily  . megestrol  400 mg Oral Daily  . metoprolol tartrate  50 mg Oral BID  . pantoprazole  40 mg Oral Daily  . predniSONE  2.5 mg Oral QODAY  . protein supplement  1 scoop Oral TID WC  . roflumilast  500 mcg Oral Daily  . sodium chloride  3 mL Intravenous Q12H  . sodium chloride  3 mL Intravenous Q12H  . Warfarin - Pharmacist Dosing Inpatient   Does not apply q1800    Continuous Infusions:   Maureen Chatters, RD, LDN Pager #: [phone] After-Hours Pager #: [phone]

## 2012-08-23 NOTE — Progress Notes (Signed)
Patient has been given prn treatments of xopenex twice during the night shift per her request.  Patient has not shown any signs of respiratory distress or labored breathing. Patient's O2 saturation is WNL.  Will continue to monitor.

## 2012-08-23 NOTE — Progress Notes (Signed)
Pt given Xopenex inhaler per pt request at this time; will cont. To monitor.

## 2012-08-23 NOTE — Progress Notes (Signed)
Utilization Review Completed.Gillian Kluever T1/20/2014   

## 2012-08-23 NOTE — Clinical Documentation Improvement (Signed)
MALNUTRITION DOCUMENTATION CLARIFICATION  THIS DOCUMENT IS NOT A PERMANENT PART OF THE MEDICAL RECORD  TO RESPOND TO THE THIS QUERY, FOLLOW THE INSTRUCTIONS BELOW:  1. If needed, update documentation for the patient's encounter via the notes activity.  2. Access this query again and click edit on the In [employer].  3. After updating, or not, click F2 to complete all highlighted (required) fields concerning your review. Select "additional documentation in the medical record" OR "no additional documentation provided".  4. Click Sign note button.  5. The deficiency will fall out of your In Basket *Please let us know if you are not able to complete this workflow by phone or e-mail (listed below).  Please update your documentation within the medical record to reflect your response to this query.                                                                                        08/23/12   Dear Dr. Ardyth Harps / [facility],  In a better effort to capture your patient's severity of illness, reflect appropriate length of stay and utilization of resources, a review of the patient medical record has revealed the following indicators.    Based on your clinical judgment, please clarify and document in a progress note and/or discharge summary the clinical condition associated with the following supporting information:  In responding to this query please exercise your independent judgment.  The fact that a query is asked, does not imply that any particular answer is desired or expected.   Possible Clinical Conditions?  _____Severe Malnutrition    _____Severe Protein Calorie Malnutrition     _____Other Condition  _____Cannot clinically determine     Risk Factors: Patient cachectic with adult FTT and protein calorie malnutrition per 01/19 progress notes.   Signs & Symptoms: Ht: 5'     Wt: 85.11lbs  BMI:  16.74  Weight  Loss: 22% over 2 months per RD, 08/21/12  evaluation.  Treatment: 01/18:  Continue Ensure Complete TID and Beneprotein 1 scoop TID. 01/18:  Ensure pudding with lunch and dinner trays.   Medications: 01/18:  Currently on Megace.   Nutrition Consult: 01/18:  Patient meets criteria for Severe Malnutrition in the context of chronic illness. Is Underweight with a BMI of 16.74. Has pressures ulcers: stage II sacrum and stage I right heel.   You may use possible, probable, or suspect with inpatient documentation. possible, probable, suspected diagnoses MUST be documented at the time of discharge  Reviewed:  No additional documentation provided.                                 Thank You,  Marciano Sequin,  Clinical Documentation Specialist:  Pager: [phone]  Phone: [phone]  Health Information Management [place]

## 2012-08-23 NOTE — Progress Notes (Signed)
ANTICOAGULATION CONSULT NOTE - Follow Up Consult  Pharmacy Consult for Coumadin Indication: atrial fibrillation and MVR  Allergies  Allergen Reactions  . Promethazine Hcl Other (See Comments)    unknown  . Sulfonamide Derivatives Other (See Comments)    unknown  . Tequin Other (See Comments)    hallucinations     Patient Measurements: Height: 5' (152.4 cm) Weight: 83 lb 12.8 oz (38.011 kg) IBW/kg (Calculated) : 45.5  Heparin Dosing Weight: 38 kg  Vital Signs: Temp: 97.6 F (36.4 C) (01/20 0410) Temp src: Oral (01/20 0410) BP: 87/48 mmHg (01/20 1052) Pulse Rate: 94  (01/20 1052)  Labs:  Basename 08/23/12 0615 08/22/12 0445 08/21/12 0720 08/20/12 2237 08/20/12 1827 08/20/12 1241  HGB 10.5* -- 11.5* -- -- --  HCT 33.8* -- 36.0 -- -- 39.7  PLT 170 -- 179 -- -- 207  APTT -- -- -- -- -- --  LABPROT 32.2* 27.0* 24.6* -- -- --  INR 3.37* 2.65* 2.34* -- -- --  HEPARINUNFRC -- -- -- -- -- --  CREATININE 0.53 -- 0.56 -- -- 0.65  CKTOTAL -- -- -- -- -- --  CKMB -- -- -- -- -- --  TROPONINI -- -- -- <0.30 <0.30 --    Estimated Creatinine Clearance: 31.4 ml/min (by C-G formula based on Cr of 0.53).   Medications:  Scheduled:    . docusate sodium  100 mg Oral BID  . escitalopram  20 mg Oral Daily  . feeding supplement  237 mL Oral TID WC  . feeding supplement  1 Container Oral BID WC  . fluticasone  1 spray Each Nare Daily  . folic acid  2 mg Oral Daily  . levofloxacin  500 mg Oral Q48H  . loratadine  10 mg Oral Daily  . megestrol  400 mg Oral Daily  . metoprolol tartrate  50 mg Oral BID  . pantoprazole  40 mg Oral Daily  . predniSONE  2.5 mg Oral QODAY  . protein supplement  1 scoop Oral TID WC  . roflumilast  500 mcg Oral Daily  . sodium chloride  3 mL Intravenous Q12H  . sodium chloride  3 mL Intravenous Q12H  . Warfarin - Pharmacist Dosing Inpatient   Does not apply q1800  . [DISCONTINUED] warfarin  2 mg Oral Custom  . [DISCONTINUED] warfarin  3 mg Oral Once    . [DISCONTINUED] warfarin  3 mg Oral Custom    Assessment: 79 yo F admitted with SOB/respiratory distress.  She has been on coumadin for afib as well as a recent mitral valve replacement. PTA coumadin = 3mg  TTSS except 2mg  MWF   Noted INR increase overnight 2.65>>3.37.  Likely a result of decreased PO intake, FTT.  No bleeding noted.  Goal of Therapy:  INR 2.5-3   Plan:  No Coumadin today with elevated INR. Check INR in AM. Follow-up palliative care recommendations; goals of care.   [employer], Pharm.D., BCPS Clinical Pharmacist Pager [phone] 08/23/2012 11:21 AM

## 2012-08-23 NOTE — Progress Notes (Signed)
OT  Note  Patient Details Name: Nancy Bush MRN: [id number] DOB: 1925/11/02   Cancelled Treatment:    Reason Eval/Treat Not Completed: Other (comment) Pt from SNF and returning to SNF. Will defer OT to SNF. If eval needed for admission, please reorder. Thank you.   [facility] Deserai Cansler, OTR/L  [phone] 08/23/2012 08/23/2012, 8:37 AM

## 2012-08-24 DIAGNOSIS — I4892 Unspecified atrial flutter: Secondary | ICD-10-CM | POA: Diagnosis not present

## 2012-08-24 DIAGNOSIS — K5900 Constipation, unspecified: Secondary | ICD-10-CM | POA: Diagnosis not present

## 2012-08-24 DIAGNOSIS — N185 Chronic kidney disease, stage 5: Secondary | ICD-10-CM | POA: Diagnosis not present

## 2012-08-24 DIAGNOSIS — H9190 Unspecified hearing loss, unspecified ear: Secondary | ICD-10-CM | POA: Diagnosis not present

## 2012-08-24 DIAGNOSIS — I270 Primary pulmonary hypertension: Secondary | ICD-10-CM | POA: Diagnosis not present

## 2012-08-24 DIAGNOSIS — R1312 Dysphagia, oropharyngeal phase: Secondary | ICD-10-CM | POA: Diagnosis not present

## 2012-08-24 DIAGNOSIS — J189 Pneumonia, unspecified organism: Secondary | ICD-10-CM | POA: Diagnosis not present

## 2012-08-24 DIAGNOSIS — R0602 Shortness of breath: Secondary | ICD-10-CM | POA: Diagnosis not present

## 2012-08-24 DIAGNOSIS — I4891 Unspecified atrial fibrillation: Secondary | ICD-10-CM | POA: Diagnosis not present

## 2012-08-24 DIAGNOSIS — E559 Vitamin D deficiency, unspecified: Secondary | ICD-10-CM | POA: Diagnosis not present

## 2012-08-24 DIAGNOSIS — E860 Dehydration: Secondary | ICD-10-CM | POA: Diagnosis not present

## 2012-08-24 DIAGNOSIS — R627 Adult failure to thrive: Secondary | ICD-10-CM | POA: Diagnosis not present

## 2012-08-24 DIAGNOSIS — M6281 Muscle weakness (generalized): Secondary | ICD-10-CM | POA: Diagnosis not present

## 2012-08-24 DIAGNOSIS — J449 Chronic obstructive pulmonary disease, unspecified: Secondary | ICD-10-CM | POA: Diagnosis not present

## 2012-08-24 DIAGNOSIS — I509 Heart failure, unspecified: Secondary | ICD-10-CM | POA: Diagnosis not present

## 2012-08-24 DIAGNOSIS — H9390 Unspecified disorder of ear, unspecified ear: Secondary | ICD-10-CM | POA: Diagnosis not present

## 2012-08-24 DIAGNOSIS — Z954 Presence of other heart-valve replacement: Secondary | ICD-10-CM | POA: Diagnosis not present

## 2012-08-24 DIAGNOSIS — J398 Other specified diseases of upper respiratory tract: Secondary | ICD-10-CM | POA: Diagnosis not present

## 2012-08-24 DIAGNOSIS — R630 Anorexia: Secondary | ICD-10-CM | POA: Diagnosis not present

## 2012-08-24 DIAGNOSIS — J984 Other disorders of lung: Secondary | ICD-10-CM | POA: Diagnosis not present

## 2012-08-24 DIAGNOSIS — Z950 Presence of cardiac pacemaker: Secondary | ICD-10-CM | POA: Diagnosis not present

## 2012-08-24 DIAGNOSIS — R262 Difficulty in walking, not elsewhere classified: Secondary | ICD-10-CM | POA: Diagnosis not present

## 2012-08-24 DIAGNOSIS — R279 Unspecified lack of coordination: Secondary | ICD-10-CM | POA: Diagnosis not present

## 2012-08-24 LAB — URINE CULTURE

## 2012-08-24 MED ORDER — LEVOFLOXACIN 750 MG PO TABS: 750.0000 mg | ORAL_TABLET | ORAL | Status: DC

## 2012-08-24 NOTE — Progress Notes (Signed)
ANTICOAGULATION CONSULT NOTE - Follow Up Consult  Pharmacy Consult for Coumadin Indication: atrial fibrillation and MVR  Allergies  Allergen Reactions  . Promethazine Hcl Other (See Comments)    unknown  . Sulfonamide Derivatives Other (See Comments)    unknown  . Tequin Other (See Comments)    hallucinations     Patient Measurements: Height: 5' (152.4 cm) Weight: 80 lb 12.8 oz (36.651 kg) IBW/kg (Calculated) : 45.5  Heparin Dosing Weight: 38 kg  Vital Signs: Temp: 98 F (36.7 C) (01/21 0453) Temp src: Oral (01/21 0453) BP: 102/42 mmHg (01/21 0453) Pulse Rate: 71  (01/21 0453)  Labs:  Basename 08/24/12 0500 08/23/12 0615 08/22/12 0445  HGB -- 10.5* --  HCT -- 33.8* --  PLT -- 170 --  APTT -- -- --  LABPROT 41.0* 32.2* 27.0*  INR 4.67* 3.37* 2.65*  HEPARINUNFRC -- -- --  CREATININE -- 0.53 --  CKTOTAL -- -- --  CKMB -- -- --  TROPONINI -- -- --    Estimated Creatinine Clearance: 30.3 ml/min (by C-G formula based on Cr of 0.53).   Medications:  Scheduled:     . docusate sodium  100 mg Oral BID  . escitalopram  20 mg Oral Daily  . feeding supplement  237 mL Oral TID WC  . feeding supplement  1 Container Oral BID WC  . fluticasone  1 spray Each Nare Daily  . folic acid  2 mg Oral Daily  . levofloxacin  500 mg Oral Q48H  . loratadine  10 mg Oral Daily  . megestrol  400 mg Oral Daily  . metoprolol tartrate  50 mg Oral BID  . pantoprazole  40 mg Oral Daily  . predniSONE  2.5 mg Oral QODAY  . roflumilast  500 mcg Oral Daily  . sodium chloride  3 mL Intravenous Q12H  . sodium chloride  3 mL Intravenous Q12H  . Warfarin - Pharmacist Dosing Inpatient   Does not apply q1800  . [DISCONTINUED] protein supplement  1 scoop Oral TID WC  . [DISCONTINUED] warfarin  2 mg Oral Custom  . [DISCONTINUED] warfarin  3 mg Oral Once  . [DISCONTINUED] warfarin  3 mg Oral Custom    Assessment: 80 yo F admitted with SOB/respiratory distress.  She has been on coumadin for  afib as well as a recent mitral valve replacement. PTA coumadin = 3mg  TTSS except 2mg  MWF   INR continues to increase 2.65>>3.37>>4.67.  Likely a result of decreased PO intake, FTT, and Levaquin Rx.  No bleeding noted.  Goal of Therapy:  INR 2.5-3   Plan:  No Coumadin today with elevated INR. If patient is discharged today, would recommend no Coumadin until INR check on Thursday 1/23.  [employer], Pharm.D., BCPS Clinical Pharmacist Pager [phone] 08/24/2012 10:44 AM

## 2012-08-24 NOTE — Clinical Social Work Note (Signed)
CSW notified that Pt is cleared for dc back to SNF [facility]. Pt's sister [name] at bedside, will meet Pt at SNF later today. CSW will call for non-emergency ambulance for transport. No further CSW needs at this time.   Frederico Hamman, LCSW [phone]

## 2012-08-24 NOTE — Discharge Summary (Signed)
Physician Discharge Summary  Patient ID: Nancy Bush MRN: [id number] DOB/AGE: [birth year] 79 y.o.  Admit date: 08/20/2012 Discharge date: 08/24/2012  Primary Care Physician:  [name], MD   Discharge Diagnoses:    Principal Problem:  *SOB (shortness of breath) Active Problems:  A-fib  FTT (failure to thrive) in adult  Leucocytosis  CAP (community acquired pneumonia)  Hypokalemia  Protein calorie malnutrition      Medication List     As of 08/24/2012 10:49 AM    STOP taking these medications         furosemide 20 MG tablet   Commonly known as: LASIX      potassium chloride SA 20 MEQ tablet   Commonly known as: K-DUR,KLOR-CON      protein supplement Powd      warfarin 5 MG tablet   Commonly known as: COUMADIN      TAKE these medications         acetaminophen 500 MG tablet   Commonly known as: TYLENOL   Take 1 tablet (500 mg total) by mouth every 6 (six) hours as needed.      CALTRATE 600+D 600-400 MG-UNIT per tablet   Generic drug: Calcium Carbonate-Vitamin D   Take 1 tablet by mouth 2 (two) times daily.      docusate sodium 100 MG capsule   Commonly known as: COLACE   Take 100 mg by mouth 2 (two) times daily.      escitalopram 20 MG tablet   Commonly known as: LEXAPRO   Take 20 mg by mouth daily.      feeding supplement Liqd   Take 237 mLs by mouth 3 (three) times daily with meals.      folic acid 1 MG tablet   Commonly known as: FOLVITE   Take 2 mg by mouth daily.      levalbuterol 45 MCG/ACT inhaler   Commonly known as: XOPENEX HFA   Inhale 2 puffs into the lungs every 6 (six) hours as needed. For wheezing      levofloxacin 750 MG tablet   Commonly known as: LEVAQUIN   Take 1 tablet (750 mg total) by mouth every other day. For 5 days      loratadine 10 MG tablet   Commonly known as: CLARITIN   Take 1 tablet (10 mg total) by mouth daily.      megestrol 400 MG/10ML suspension   Commonly known as: MEGACE   Take 10 mLs (400  mg total) by mouth daily.      metoprolol 50 MG tablet   Commonly known as: LOPRESSOR   Take 1 tablet (50 mg total) by mouth 2 (two) times daily.      mometasone 50 MCG/ACT nasal spray   Commonly known as: NASONEX   Place 2 sprays into the nose daily.      nitroGLYCERIN 0.4 MG SL tablet   Commonly known as: NITROSTAT   Place 1 tablet (0.4 mg total) under the tongue every 5 (five) minutes as needed for chest pain.      ondansetron 4 MG tablet   Commonly known as: ZOFRAN   Take 4 mg by mouth every 6 (six) hours as needed. For nausea      predniSONE 2.5 MG tablet   Commonly known as: DELTASONE   Take 2.5 mg by mouth every other day.      roflumilast 500 MCG Tabs tablet   Commonly known as: DALIRESP   Take 500 mcg by mouth daily.  traMADol 50 MG tablet   Commonly known as: ULTRAM   Take 50 mg by mouth every 4 (four) hours as needed. For pain      warfarin 1 MG tablet   Commonly known as: COUMADIN   Take 2-3 tablets (2-3 mg total) by mouth daily. Takes 3mg  daily except 2mg  on Monday, Wednesday, and Friday         Disposition and Follow-up:  Will be discharged back to SNF today in stable condition. I would advise palliative care follow up at the SNF.  Consults:  None   Significant Diagnostic Studies:  Dg Swallowing Func-speech Pathology  08/23/2012  Breck Coons [place], CCC-SLP     08/23/2012 10:16 AM Objective Swallowing Evaluation: Modified Barium Swallowing Study   Patient Details  Name: Nancy Bush MRN: [id number] Date of Birth: 06/09/1926  Today's Date: 08/23/2012 Time: 0925-0940 SLP Time Calculation (min): 15 min  Past Medical History:  Past Medical History  Diagnosis Date  . MVP (mitral valve prolapse)   . Pulmonary hypertension     MILD TO MODERATE BY ECHO  . History of chest pain   . Mitral regurgitation     MILD  . Asthma   . Rheumatoid arthritis   . Shortness of breath   . H/O: hysterectomy   . LBBB (left bundle branch block)     per discharge note 2003  .  PVC (premature ventricular contraction)   . Atrial fibrillation   . Hypertension   . Heart murmur   . S/P mitral valve replacement with bioprosthetic valve  05/18/2012    27mm Edwards Magna Mitral pericardial bioprosthesis   . S/P Maze operation for atrial fibrillation 05/18/2012    Complete biatrial lesion set using cryothermy with oversewing  of LA appendage  . COPD (chronic obstructive pulmonary disease)   . CHF (congestive heart failure)   . Pacemaker    Past Surgical History:  Past Surgical History  Procedure Date  . Cholecystectomy   . Ovarian cyst removal   . Hysterectomy   . Appendectomy   . Cataract extraction   . Tee without cardioversion 02/09/2012    Procedure: TRANSESOPHAGEAL ECHOCARDIOGRAM (TEE);  Surgeon:  Pricilla Riffle, MD;  Location: [facility] ENDOSCOPY;  Service:  Cardiovascular;  Laterality: N/A;  . Maze 05/18/2012    Procedure: MAZE;  Surgeon: Purcell Nails, MD;  Location: [facility]  OR;  Service: Open Heart Surgery;  Laterality: Right;  . Mitral valve replacement 05/18/2012    Procedure: MINIMALLY INVASIVE MITRAL VALVE (MV) REPLACEMENT;   Surgeon: Purcell Nails, MD;  Location: [facility] OR;  Service: Open  Heart Surgery;  Laterality: N/A;  . Pacemaker insertion   . Abdominal hysterectomy    HPI:  79 year old female who has had a bioprosthetic mitral valve  replaced and history of atrial fibrillation status post Maze  procedure on Coumadin was brought from nursing home after patient  was found to be having increasing weakness and shortness of  breath. On arrival in the ER patient was found to be in atrial  fibrillation with RVR and was initially given fluid bolus normal  saline after which heart rate has improved and presently  controlled. Patient's chest x-ray showed left pleural effusion  which is chronic but did not show any infiltrates. Blood work  showed increased lactic acid level and also leukocytosis. Patient  was afebrile. As per patient's family was at the bedside patient  has had some productive cough for  last few days and in the  nursing home they had checked his sputum but was not started on  any antibiotics.  Patient has not been eating well for last few  weeks and the physicians at the nursing home it discussed about  feeding tube with the patient. But yesterday a she did have a  large meal as per family.  Bedside swallow assessment recommended  MBS.     Assessment / Plan / Recommendation Clinical Impression  Dysphagia Diagnosis: Moderate oral phase dysphagia;Mild  pharyngeal phase dysphagia Clinical impression: Pt. weak with tremors during MBS.  She  exhibited moderate oral dysphagia with sensory-motor impairments  including weak manipulation, lingual pumping and delayed transit.   Mild motor based pharyngeal phase dysphagia reveals intermittent  delayed swallow initiation to valleculae with mildly decreased  laryngeal elevation and closure resulting in penetration of thin  and nectar thick barium.  Penetration with nectar was flash (in  and out of vestibule), however straw sip of thin remained on  anterior wall without sensation.  Decreased oral prep,  manipulation and transit with solid texture.  Esophageal scan  showed what appeared to be mildly delayed esophgeal emptying  through GE junction.  Prognosis for return to thin liquids is  good as overall strength and endurance improve.  Recommend she  continue with Dys 2 diet and nectar thick liquids.  ST will  continue to follow for diet/safety tolerance and progression when  appropriate.     Treatment Recommendation  Therapy as outlined in treatment plan below    Diet Recommendation Dysphagia 2 (Fine chop);Nectar-thick liquid   Liquid Administration via: Cup;No straw Medication Administration: Whole meds with puree Supervision: Full supervision/cueing for compensatory strategies Compensations: Slow rate;Small sips/bites Postural Changes and/or Swallow Maneuvers: Seated upright 90  degrees    Other  Recommendations Oral Care Recommendations: Oral care BID    Follow Up Recommendations  Skilled Nursing facility    Frequency and Duration min 2x/week  2 weeks        SLP Swallow Goals Patient will consume recommended diet without observed clinical  signs of aspiration with: Moderate assistance Swallow Study Goal #1 - Progress: Progressing toward goal Patient will utilize recommended strategies during swallow to  increase swallowing safety with: Moderate assistance Swallow Study Goal #2 - Progress: Progressing toward goal      Reason for Referral Objectively evaluate swallowing function   Oral Phase Oral Preparation/Oral Phase Oral Phase: Impaired Oral - Honey Oral - Honey Teaspoon: Lingual pumping;Weak lingual  manipulation;Delayed oral transit Oral - Nectar Oral - Nectar Teaspoon: Lingual pumping;Weak lingual  manipulation;Delayed oral transit Oral - Nectar Cup: Lingual pumping;Weak lingual  manipulation;Delayed oral transit Oral - Thin Oral - Thin Teaspoon: Lingual pumping;Weak lingual  manipulation;Delayed oral transit Oral - Thin Cup: Lingual pumping;Weak lingual  manipulation;Delayed oral transit Oral - Solids Oral - Puree: Lingual pumping;Weak lingual manipulation;Delayed  oral transit Oral - Regular: Delayed oral transit;Weak lingual  manipulation;Impaired mastication;Lingual pumping   Pharyngeal Phase Pharyngeal Phase Pharyngeal Phase: Impaired Pharyngeal - Honey Pharyngeal - Honey Teaspoon: Delayed swallow initiation;Premature  spillage to valleculae Pharyngeal - Honey Cup: Delayed swallow initiation;Premature  spillage to valleculae Pharyngeal - Nectar Pharyngeal - Nectar Teaspoon: Delayed swallow  initiation;Premature spillage to valleculae Pharyngeal - Nectar Cup: Penetration/Aspiration during  swallow;Reduced laryngeal elevation;Reduced airway/laryngeal  closure Penetration/Aspiration details (nectar cup): Material enters  airway, remains ABOVE vocal cords then ejected out Pharyngeal - Thin Pharyngeal - Thin Teaspoon: Penetration/Aspiration during   swallow;Delayed swallow initiation;Premature spillage to  valleculae;Reduced airway/laryngeal closure;Reduced laryngeal  elevation Penetration/Aspiration  details (thin teaspoon): Material enters  airway, remains ABOVE vocal cords then ejected out Pharyngeal - Thin Cup: Penetration/Aspiration during  swallow;Reduced laryngeal elevation;Reduced airway/laryngeal  closure Penetration/Aspiration details (thin cup): Material enters  airway, remains ABOVE vocal cords then ejected out Pharyngeal - Thin Straw: Penetration/Aspiration during swallow Penetration/Aspiration details (thin straw): Material enters  airway, remains ABOVE vocal cords and not ejected out Pharyngeal - Solids Pharyngeal - Puree: Delayed swallow initiation;Premature spillage  to valleculae Pharyngeal - Regular: Within functional limits  Cervical Esophageal Phase        Cervical Esophageal Phase Cervical Esophageal Phase: [facility]         Darrow Bussing.Ed CCC-SLP Pager [phone]  08/23/2012     CXR 1/28: IMPRESSION:  1. Unchanged left basilar/retrocardiac consolidative opacities,  atelectasis versus infiltrate. Further evaluation with a PA and  lateral chest radiograph may be obtained as clinically indicated.  2. Stable findings of lung hyperexpansion and chronic trace  bilateral effusions.   Brief H and P: For complete details please refer to admission H and P, but in brief patient is an 79 year old female who has had a bioprosthetic mitral valve replaced and history of atrial fibrillation status post Maze procedure on Coumadin was brought from nursing home after patient was found to be having increasing weakness and shortness of breath. On arrival in the ER patient was found to be in atrial fibrillation with RVR and was initially given fluid bolus normal saline after which heart rate has improved and presently controlled. As per patient's family was at the bedside patient has had some productive cough for last few days and in the nursing  home they had checked his sputum but was not started on any antibiotics. Patient also had some abdominal pain 2 weeks ago and x-rays did not reveal any acute changes as per family presently patient denies any abdominal pain nausea vomiting or diarrhea. Patient has not been eating well for last few weeks and the physicians at the nursing home it discussed about feeding tube with the patient. We were asked to admit her for further evaluation and management.      Hospital Course:  Principal Problem:  *SOB (shortness of breath) Active Problems:  A-fib  FTT (failure to thrive) in adult  Leucocytosis  CAP (community acquired pneumonia)  Hypokalemia  Protein calorie malnutrition   SOB  -Resolved.  -Could have been from her a fib with RVR.  -Also possibly from PNA.  PNA -Will treat with levaquin.  -Influenza PCR is negative.   Hypokalemia  -Repleted.   FTT/Protein-Caloric Malnutrition  -Family reports lack of appetite and weight loss over the past 3 months.  -Continue megace.  - TSH/B12/RPR are WNL.   -MD at SNF had talked to them about a feeding tube. -Patient has a long-term poor prognosis as she refuses to eat. She is quite adamant that she would not want a feeding tube ever. She wants to continue to pursue aggressive care for now, but is willing for palliative care to follow at SNF for continued goals of care.    Time spent on Discharge: Greater than 30 minutes.  SignedChaya Jan [facility] Pager: [phone] 08/24/2012, 10:49 AM

## 2012-08-24 NOTE — Progress Notes (Signed)
Speech Language Pathology Dysphagia Treatment Patient Details Name: Nancy Bush MRN: [id number] DOB: 22-Dec-1925 Today's Date: 08/24/2012 Time: 7829-5621 SLP Time Calculation (min): 35 min  Assessment / Plan / Recommendation Clinical Impression  Patient appears to be tolerating current diet, but is not eating much (only took a few bites and stated she was full).  No overt s/s of aspiration observed.  Patient asked when she could stop using the thickener, and did not appear to understand why it is necessary. SLP reviewed the results of the MBS of yesterday, with the patient and her sister, and recommended that the patient receive ongoing ST at SNF.  She will need to return for a repeat MBS as an OP prior to discontinuing thick liquids, as patient does not cough when penetrating thin liquids.  Patient does not like thick liquids, but agreed with this plan.    Diet Recommendation  Continue with Current Diet: Dysphagia 2 (fine chop);Nectar-thick liquid (SLP at SNF may advance to Mechanical Soft after observation )    SLP Plan Continue with current plan of care   Pertinent Vitals/Pain n/a   Swallowing Goals  SLP Swallowing Goals Patient will consume recommended diet without observed clinical signs of aspiration with: Supervision/safety;Set-up;Minimal assistance Swallow Study Goal #1 - Progress: Progressing toward goal Patient will utilize recommended strategies during swallow to increase swallowing safety with: Minimal assistance Swallow Study Goal #2 - Progress: Progressing toward goal  General Temperature Spikes Noted: No Respiratory Status: Supplemental O2 delivered via (comment) Behavior/Cognition: Alert;Cooperative;Hard of hearing;Requires cueing Oral Cavity - Dentition: Adequate natural dentition Patient Positioning: Upright in bed  Oral Cavity - Oral Hygiene Does patient have any of the following "at risk" factors?: Oxygen therapy - cannula, mask, simple oxygen devices;Saliva  - thick, dry mouth;Mucous Membranes - reddened Brush patient's teeth BID with toothbrush (using toothpaste with fluoride): Yes Patient is HIGH RISK - Oral Care Protocol followed (see row info): Yes Patient is AT RISK - Oral Care Protocol followed (see row info): Yes   Dysphagia Treatment Treatment focused on: Skilled observation of diet tolerance;Patient/family/caregiver education Treatment Methods/Modalities: Skilled observation Patient observed directly with PO's: Yes Type of PO's observed: Dysphagia 2 (chopped);Nectar-thick liquids Feeding: Needs assist Liquids provided via: Cup Type of cueing: Verbal Amount of cueing: Minimal   GO     Maryjo Rochester T 08/24/2012, 1:58 PM

## 2012-08-26 DIAGNOSIS — H9390 Unspecified disorder of ear, unspecified ear: Secondary | ICD-10-CM | POA: Diagnosis not present

## 2012-08-26 DIAGNOSIS — J398 Other specified diseases of upper respiratory tract: Secondary | ICD-10-CM | POA: Diagnosis not present

## 2012-08-27 DIAGNOSIS — I509 Heart failure, unspecified: Secondary | ICD-10-CM | POA: Diagnosis not present

## 2012-08-27 DIAGNOSIS — J189 Pneumonia, unspecified organism: Secondary | ICD-10-CM | POA: Diagnosis not present

## 2012-08-27 DIAGNOSIS — R627 Adult failure to thrive: Secondary | ICD-10-CM | POA: Diagnosis not present

## 2012-08-27 DIAGNOSIS — I4891 Unspecified atrial fibrillation: Secondary | ICD-10-CM | POA: Diagnosis not present

## 2012-08-27 LAB — CULTURE, BLOOD (ROUTINE X 2)
Culture: NO GROWTH
Culture: NO GROWTH

## 2012-08-30 DIAGNOSIS — R627 Adult failure to thrive: Secondary | ICD-10-CM | POA: Diagnosis not present

## 2012-08-31 DIAGNOSIS — J398 Other specified diseases of upper respiratory tract: Secondary | ICD-10-CM | POA: Diagnosis not present

## 2012-08-31 DIAGNOSIS — H9390 Unspecified disorder of ear, unspecified ear: Secondary | ICD-10-CM | POA: Diagnosis not present

## 2012-09-03 ENCOUNTER — Telehealth: Payer: Self-pay | Admitting: Physician Assistant

## 2012-09-03 DIAGNOSIS — R627 Adult failure to thrive: Secondary | ICD-10-CM | POA: Diagnosis not present

## 2012-09-03 DIAGNOSIS — H9390 Unspecified disorder of ear, unspecified ear: Secondary | ICD-10-CM | POA: Diagnosis not present

## 2012-09-03 DIAGNOSIS — J398 Other specified diseases of upper respiratory tract: Secondary | ICD-10-CM | POA: Diagnosis not present

## 2012-09-03 DIAGNOSIS — I509 Heart failure, unspecified: Secondary | ICD-10-CM | POA: Diagnosis not present

## 2012-09-03 DIAGNOSIS — R630 Anorexia: Secondary | ICD-10-CM | POA: Diagnosis not present

## 2012-09-03 NOTE — Telephone Encounter (Signed)
Late entry. Received call recently from [employer], PA-C.  She follows patient closely at [facility] and is available for consultation as necessary. Patient has displayed signs of FTT in the past and has been treated with Megace as well as Ritalin.  She is not participating in PT. Tereso Newcomer, PA-C  2:47 PM 09/03/2012

## 2012-09-08 DIAGNOSIS — R627 Adult failure to thrive: Secondary | ICD-10-CM | POA: Diagnosis not present

## 2012-09-09 ENCOUNTER — Encounter: Payer: Self-pay | Admitting: Internal Medicine

## 2012-09-09 ENCOUNTER — Encounter: Payer: Medicare Other | Admitting: Internal Medicine

## 2012-09-09 ENCOUNTER — Ambulatory Visit (INDEPENDENT_AMBULATORY_CARE_PROVIDER_SITE_OTHER): Payer: Medicare Other | Admitting: Internal Medicine

## 2012-09-09 VITALS — BP 80/50 | HR 86 | Ht 60.0 in | Wt 82.0 lb

## 2012-09-09 DIAGNOSIS — Z950 Presence of cardiac pacemaker: Secondary | ICD-10-CM

## 2012-09-09 DIAGNOSIS — Z953 Presence of xenogenic heart valve: Secondary | ICD-10-CM

## 2012-09-09 DIAGNOSIS — I4892 Unspecified atrial flutter: Secondary | ICD-10-CM | POA: Diagnosis not present

## 2012-09-09 DIAGNOSIS — I4891 Unspecified atrial fibrillation: Secondary | ICD-10-CM

## 2012-09-09 DIAGNOSIS — Z954 Presence of other heart-valve replacement: Secondary | ICD-10-CM | POA: Diagnosis not present

## 2012-09-09 DIAGNOSIS — E46 Unspecified protein-calorie malnutrition: Secondary | ICD-10-CM

## 2012-09-09 LAB — PACEMAKER DEVICE OBSERVATION
AL AMPLITUDE: 2.9 mv
ATRIAL PACING PM: 1
DEVICE MODEL PM: 111608
RV LEAD THRESHOLD: 0.6 V
VENTRICULAR PACING PM: 34

## 2012-09-09 NOTE — Progress Notes (Signed)
HPI Nancy Bush is today for followup. She is a history of chronic diastolic heart failure, and mitral regurgitation status post mitral valve replacement, atrial arrhythmias status post maze procedure, and failure to thrive. She's been residing in a nursing home. The patient has had anorexia. She denies syncope. She is sedentary. I've not seen the patient in many months, but her affect appears different. She denies chest pain or shortness of breath. She does have fatigue and weakness. Allergies  Allergen Reactions  . Promethazine Hcl Other (See Comments)    unknown  . Sulfonamide Derivatives Other (See Comments)    unknown  . Tequin Other (See Comments)    hallucinations      Current Outpatient Prescriptions  Medication Sig Dispense Refill  . acetaminophen (TYLENOL) 500 MG tablet Take 1 tablet (500 mg total) by mouth every 6 (six) hours as needed.  30 tablet    . albuterol (PROVENTIL) (2.5 MG/3ML) 0.083% nebulizer solution Take 2.5 mg by nebulization every 6 (six) hours as needed.      . Calcium Carbonate-Vitamin D (CALTRATE 600+D) 600-400 MG-UNIT per tablet Take 1 tablet by mouth 2 (two) times daily.      Marland Kitchen docusate sodium (COLACE) 100 MG capsule Take 100 mg by mouth 2 (two) times daily.      Marland Kitchen escitalopram (LEXAPRO) 20 MG tablet Take 20 mg by mouth daily.      . folic acid (FOLVITE) 1 MG tablet Take 2 mg by mouth daily.       Marland Kitchen levalbuterol (XOPENEX HFA) 45 MCG/ACT inhaler Inhale 2 puffs into the lungs every 6 (six) hours as needed. For wheezing      . loratadine (CLARITIN) 10 MG tablet Take 1 tablet (10 mg total) by mouth daily.      Marland Kitchen LORazepam (ATIVAN) 0.5 MG tablet Take 0.5 mg by mouth every morning.      Marland Kitchen LORazepam (ATIVAN) 0.5 MG tablet Take 0.25 mg by mouth every 6 (six) hours as needed.      . megestrol (MEGACE) 400 MG/10ML suspension Take 10 mLs (400 mg total) by mouth daily.  240 mL    . metoprolol tartrate (LOPRESSOR) 50 MG tablet Take 1 tablet (50 mg total) by mouth 2  (two) times daily.      . mometasone (NASONEX) 50 MCG/ACT nasal spray Place 2 sprays into the nose daily.      . nitroGLYCERIN (NITROSTAT) 0.4 MG SL tablet Place 1 tablet (0.4 mg total) under the tongue every 5 (five) minutes as needed for chest pain.      Marland Kitchen ondansetron (ZOFRAN) 4 MG tablet Take 4 mg by mouth every 6 (six) hours as needed. For nausea      . predniSONE (DELTASONE) 2.5 MG tablet Take 2.5 mg by mouth every other day.      . Rivaroxaban (XARELTO) 20 MG TABS Take 20 mg by mouth daily.      . roflumilast (DALIRESP) 500 MCG TABS tablet Take 500 mcg by mouth daily.      . traMADol (ULTRAM) 50 MG tablet Take 50 mg by mouth every 4 (four) hours as needed. For pain         Past Medical History  Diagnosis Date  . MVP (mitral valve prolapse)   . Pulmonary hypertension     MILD TO MODERATE BY ECHO  . History of chest pain   . Mitral regurgitation     MILD  . Asthma   . Rheumatoid arthritis   . Shortness  of breath   . H/O: hysterectomy   . LBBB (left bundle branch block)     per discharge note 2003  . PVC (premature ventricular contraction)   . Atrial fibrillation   . Hypertension   . Heart murmur   . S/P mitral valve replacement with bioprosthetic valve 05/18/2012    27mm Edwards Magna Mitral pericardial bioprosthesis   . S/P Maze operation for atrial fibrillation 05/18/2012    Complete biatrial lesion set using cryothermy with oversewing of LA appendage  . COPD (chronic obstructive pulmonary disease)   . CHF (congestive heart failure)   . Pacemaker     ROS:   All systems reviewed and negative except as noted in the HPI.   Past Surgical History  Procedure Date  . Cholecystectomy   . Ovarian cyst removal   . Hysterectomy   . Appendectomy   . Cataract extraction   . Tee without cardioversion 02/09/2012    Procedure: TRANSESOPHAGEAL ECHOCARDIOGRAM (TEE);  Surgeon: Pricilla Riffle, MD;  Location: [facility] ENDOSCOPY;  Service: Cardiovascular;  Laterality: N/A;  . Maze  05/18/2012    Procedure: MAZE;  Surgeon: Purcell Nails, MD;  Location: [facility] OR;  Service: Open Heart Surgery;  Laterality: Right;  . Mitral valve replacement 05/18/2012    Procedure: MINIMALLY INVASIVE MITRAL VALVE (MV) REPLACEMENT;  Surgeon: Purcell Nails, MD;  Location: [facility] OR;  Service: Open Heart Surgery;  Laterality: N/A;  . Pacemaker insertion   . Abdominal hysterectomy      No family history on file.   History   Social History  . Marital Status: Single    Spouse Name: N/A    Number of Children: N/A  . Years of Education: N/A   Occupational History  . Not on file.   Social History Main Topics  . Smoking status: Never Smoker   . Smokeless tobacco: Not on file  . Alcohol Use: No  . Drug Use: No  . Sexually Active: Not Currently    Birth Control/ Protection: Post-menopausal   Other Topics Concern  . Not on file   Social History Narrative  . No narrative on file     BP 80/50  Pulse 86  Ht 5' (1.524 m)  Wt 82 lb (37.195 kg)  BMI 16.01 kg/m2  SpO2 97%  Physical Exam:  Chronically ill appearing elderly woman, NAD HEENT: Unremarkable Neck:  6 cm JVD, no thyromegally Lungs:  Clear with minimal basilar rales. No wheezes or rhonchi. HEART:  Regular rate rhythm, no murmurs, no rubs, no clicks Abd:  soft, positive bowel sounds, no organomegally, no rebound, no guarding Ext:  2 plus pulses, no edema, no cyanosis, no clubbing Skin:  No rashes no nodules Neuro:  CN II through XII intact, motor grossly intact  DEVICE  Normal device function.  See PaceArt for details.   Assess/Plan:

## 2012-09-09 NOTE — Assessment & Plan Note (Signed)
Her [employer] dual-chamber pacemaker is working normally. She is maintaining sinus rhythm. We'll plan to recheck in several months.

## 2012-09-09 NOTE — Assessment & Plan Note (Signed)
Her exam today suggest normal valvular function. We'll follow.

## 2012-09-09 NOTE — Assessment & Plan Note (Signed)
She is maintaining sinus rhythm. No change in medical therapy. 

## 2012-09-09 NOTE — Assessment & Plan Note (Signed)
She appears malnourished and notes that her appetite is poor. I've encouraged the patient to increase her intake.

## 2012-09-09 NOTE — Patient Instructions (Addendum)
Your physician wants you to follow-up in: 12 months with Dr. Taylor. You will receive a reminder letter in the mail two months in advance. If you don't receive a letter, please call our office to schedule the follow-up appointment.    

## 2012-09-09 NOTE — Assessment & Plan Note (Signed)
She is maintaining sinus rhythm very nicely. She'll continue her current medical therapy. 

## 2012-09-10 DIAGNOSIS — R627 Adult failure to thrive: Secondary | ICD-10-CM | POA: Diagnosis not present

## 2012-09-13 DIAGNOSIS — R627 Adult failure to thrive: Secondary | ICD-10-CM | POA: Diagnosis not present

## 2012-09-21 ENCOUNTER — Ambulatory Visit (INDEPENDENT_AMBULATORY_CARE_PROVIDER_SITE_OTHER): Payer: Medicare Other | Admitting: Physician Assistant

## 2012-09-21 VITALS — BP 93/51 | Ht 60.0 in | Wt 93.8 lb

## 2012-09-21 DIAGNOSIS — R627 Adult failure to thrive: Secondary | ICD-10-CM

## 2012-09-21 DIAGNOSIS — I4891 Unspecified atrial fibrillation: Secondary | ICD-10-CM

## 2012-09-21 DIAGNOSIS — I5032 Chronic diastolic (congestive) heart failure: Secondary | ICD-10-CM

## 2012-09-21 NOTE — Progress Notes (Signed)
[address], [address] [place], [place]  [address] Phone: [phone], Fax:  [phone]  Date:  09/21/2012   ID:  Nancy Bush, DOB Oct 13, 1925, MRN [id number]  PCP:  [name], MD  Primary Cardiologist:  Dr. Rollene Rotunda   Primary Electrophysiologist:  Dr. Lewayne Bunting    History of Present Illness: Nancy Bush is a 79 y.o. female who returns for follow up.  She has a hx of diastolic CHF, MVP with MR, rheumatoid arthritis, pulmonary HTN, persistent AFib.  LHC 10/13: LAD 30%, mid RCA 30-40%, EF 65-70%, severe mitral regurgitation, MAC, patent renal arteries bilaterally, abdominal aorta without stenosis or dilatation. She underwent minimally invasive MVR with a bioprosthetic mitral valve and MAZE procedure with Dr. Tressie Stalker in 10/13. Her postoperative course was complicated by significant bradycardia and she underwent permanent pacemaker implantation with Dr. Ladona Ridgel.  She as admitted in 06/2012 with a/c diastolic CHF.  Echo 06/15/12: EF 50-55%, MVR ok, moderate RAE, severe TR, PASP 34.  She was noted to be in AFlutter and was treated with rate control strategy.  I saw her 07/2012 and she was back in NSR.  Last seen by Dr. Antoine Poche 08/11/12.  She has been kept on coumadin.  She has been noted to have failure to thrive. She's had poor appetite and weight loss.  At last visit, her Lasix, potassium and beta blocker were all reduced.  She was then admitted 1/17-1/21 with increased dyspnea in the setting of AFib with RVR.  There was a question of pneumonia on her CXR and she was covered with antibiotics (Levaquin).  She was evaluated by SLP and noted to have some dysphagia.  Since d/c, she saw Dr. Ladona Ridgel in follow up 09/09/12 and noted to by in NSR.    She is here with her daughter. She notes that her appetite has picked up over the last 2 weeks. She continues to have chronic dyspnea with exertion. She is probably NYHA class IIb-III. She denies orthopnea, PND. She  denies LE edema. She has occasional atypical chest pains. She denies syncope.  Of note, due to her failure to thrive and increasing weakness, her anticoagulation was discontinued.  Labs (11/13): K 3.9, creatinine 0.68, ALT 11 , Hgb 10.3, TSH 1.35 Labs (1/14):   K 4.1, creatinine 0.53, ALT 10, Hgb 10.5, TSH 1.481,   Wt Readings from Last 3 Encounters:  09/21/12 93 lb 12.8 oz (42.547 kg)  09/09/12 82 lb (37.195 kg)  08/24/12 80 lb 12.8 oz (36.651 kg)     Past Medical History  Diagnosis Date  . MVP (mitral valve prolapse)   . Pulmonary hypertension     MILD TO MODERATE BY ECHO  . History of chest pain   . Mitral regurgitation     MILD  . Asthma   . Rheumatoid arthritis   . Shortness of breath   . H/O: hysterectomy   . LBBB (left bundle branch block)     per discharge note 2003  . PVC (premature ventricular contraction)   . Atrial fibrillation   . Hypertension   . Heart murmur   . S/P mitral valve replacement with bioprosthetic valve 05/18/2012    27mm Edwards Magna Mitral pericardial bioprosthesis   . S/P Maze operation for atrial fibrillation 05/18/2012    Complete biatrial lesion set using cryothermy with oversewing of LA appendage  . COPD (chronic obstructive pulmonary disease)   . CHF (congestive heart failure)   . Pacemaker  Current Outpatient Prescriptions  Medication Sig Dispense Refill  . acetaminophen (TYLENOL) 500 MG tablet Take 1 tablet (500 mg total) by mouth every 6 (six) hours as needed.  30 tablet    . albuterol (PROVENTIL) (2.5 MG/3ML) 0.083% nebulizer solution Take 2.5 mg by nebulization every 6 (six) hours as needed.      . Calcium Carbonate-Vitamin D (CALTRATE 600+D) 600-400 MG-UNIT per tablet Take 1 tablet by mouth 2 (two) times daily.      Marland Kitchen docusate sodium (COLACE) 100 MG capsule Take 100 mg by mouth 2 (two) times daily.      Marland Kitchen escitalopram (LEXAPRO) 20 MG tablet Take 20 mg by mouth daily.      . folic acid (FOLVITE) 1 MG tablet Take 2 mg by mouth  daily.       Marland Kitchen levalbuterol (XOPENEX HFA) 45 MCG/ACT inhaler Inhale 2 puffs into the lungs every 6 (six) hours as needed. For wheezing      . loratadine (CLARITIN) 10 MG tablet Take 1 tablet (10 mg total) by mouth daily.      Marland Kitchen LORazepam (ATIVAN) 0.5 MG tablet Take 0.5 mg by mouth every morning.      . megestrol (MEGACE) 400 MG/10ML suspension Take 10 mLs (400 mg total) by mouth daily.  240 mL    . metoprolol tartrate (LOPRESSOR) 50 MG tablet Take 1 tablet (50 mg total) by mouth 2 (two) times daily.      . mometasone (NASONEX) 50 MCG/ACT nasal spray Place 2 sprays into the nose daily.      . nitroGLYCERIN (NITROSTAT) 0.4 MG SL tablet Place 1 tablet (0.4 mg total) under the tongue every 5 (five) minutes as needed for chest pain.      Marland Kitchen ondansetron (ZOFRAN) 4 MG tablet Take 4 mg by mouth every 6 (six) hours as needed. For nausea      . predniSONE (DELTASONE) 2.5 MG tablet Take 2.5 mg by mouth every other day.      . Rivaroxaban (XARELTO) 20 MG TABS Take 20 mg by mouth daily.      . roflumilast (DALIRESP) 500 MCG TABS tablet Take 500 mcg by mouth daily.      . traMADol (ULTRAM) 50 MG tablet Take 50 mg by mouth every 4 (four) hours as needed. For pain       No current facility-administered medications for this visit.    Allergies:    Allergies  Allergen Reactions  . Promethazine Hcl Other (See Comments)    unknown  . Sulfonamide Derivatives Other (See Comments)    unknown  . Tequin Other (See Comments)    hallucinations     Social History:  The patient  reports that she has never smoked. She does not have any smokeless tobacco history on file. She reports that she does not drink alcohol or use illicit drugs.   ROS:  Please see the history of present illness.   All other systems reviewed and negative.   PHYSICAL EXAM: VS:  BP 93/51  Ht 5' (1.524 m)  Wt 93 lb 12.8 oz (42.547 kg)  BMI 18.32 kg/m2 Thin, frail, chronically ill-appearing female, in no acute distress HEENT: normal Neck:  no JVD at 90 Cardiac:  normal S1, S2; RRR; no murmur Lungs:  Decreased breath sounds bilaterally, no rales Abd: soft, nontender Ext: no edema Skin: warm and dry Neuro:  CNs 2-12 intact, no focal abnormalities noted  EKG:  NSR, V. Paced, HR 81     ASSESSMENT AND  PLAN:  1. Atrial Fibrillation:  She is maintaining sinus rhythm. Her PCP at the SNF has discontinued her anticoagulation and placed on aspirin due to failure to 5 and increasing weakness.  Her appetite has increased recently. If she continues to improve with increasing appetite increasing strength, consider restarting anticoagulation therapy. 2. Diastolic CHF:  Volume appears to be stable. She seems to be tolerating no diuretic at this time. Continue current therapy. 3. Bradycardia, Status Post Pacemaker: Followup with Dr. Ladona Ridgel as planned. 4. Failure to Thrive:  Her appetite is recently improved. She is now on Megace. 5. Disposition: Follow up with me in one month and Dr. Antoine Poche in 3 months.  Signed, Tereso Newcomer, PA-C  2:44 PM 09/21/2012

## 2012-09-21 NOTE — Patient Instructions (Addendum)
Your physician recommends that you schedule a follow-up appointment in: 10/25/12 @ 2 PM WITH  [facility]   Your physician recommends that you schedule a follow-up appointment in: 12/13/12 @ 2 PM WITH DR. HOCHREIN  NO CHANGES WERE MADE TODAY

## 2012-09-24 DIAGNOSIS — R627 Adult failure to thrive: Secondary | ICD-10-CM | POA: Diagnosis not present

## 2012-09-24 DIAGNOSIS — I509 Heart failure, unspecified: Secondary | ICD-10-CM | POA: Diagnosis not present

## 2012-09-27 DIAGNOSIS — H9190 Unspecified hearing loss, unspecified ear: Secondary | ICD-10-CM | POA: Diagnosis not present

## 2012-10-08 DIAGNOSIS — J449 Chronic obstructive pulmonary disease, unspecified: Secondary | ICD-10-CM | POA: Diagnosis not present

## 2012-10-11 DIAGNOSIS — J189 Pneumonia, unspecified organism: Secondary | ICD-10-CM | POA: Diagnosis not present

## 2012-10-11 DIAGNOSIS — J449 Chronic obstructive pulmonary disease, unspecified: Secondary | ICD-10-CM | POA: Diagnosis not present

## 2012-10-12 DIAGNOSIS — J449 Chronic obstructive pulmonary disease, unspecified: Secondary | ICD-10-CM | POA: Diagnosis not present

## 2012-10-14 DIAGNOSIS — J189 Pneumonia, unspecified organism: Secondary | ICD-10-CM | POA: Diagnosis not present

## 2012-10-15 DIAGNOSIS — R627 Adult failure to thrive: Secondary | ICD-10-CM | POA: Diagnosis not present

## 2012-10-15 DIAGNOSIS — J449 Chronic obstructive pulmonary disease, unspecified: Secondary | ICD-10-CM | POA: Diagnosis not present

## 2012-10-22 ENCOUNTER — Telehealth: Payer: Self-pay | Admitting: Nurse Practitioner

## 2012-10-22 DIAGNOSIS — Z950 Presence of cardiac pacemaker: Secondary | ICD-10-CM | POA: Diagnosis not present

## 2012-10-22 DIAGNOSIS — I509 Heart failure, unspecified: Secondary | ICD-10-CM | POA: Diagnosis not present

## 2012-10-22 DIAGNOSIS — R262 Difficulty in walking, not elsewhere classified: Secondary | ICD-10-CM | POA: Diagnosis not present

## 2012-10-22 DIAGNOSIS — I2789 Other specified pulmonary heart diseases: Secondary | ICD-10-CM | POA: Diagnosis not present

## 2012-10-22 DIAGNOSIS — J449 Chronic obstructive pulmonary disease, unspecified: Secondary | ICD-10-CM | POA: Diagnosis not present

## 2012-10-22 NOTE — Telephone Encounter (Signed)
Pt called this evening stating that she was d/c'd from [facility] about a week ago.  She had been on Xarelto but her sister, who is/was a nurse, thought that it was making her "worse" and so they stopped it at least 7 days ago.  When I asked what she was experiencing, the pt wasn't able to describe her Ss and in fact said that she wasn't sure that it was making her any worse, but her sister did, so they stopped it.  She asks now if she should start taking coumadin, which she has at home from an old Rx.  She has an appt with Dr. Antoine Poche.  I advised that she is likely best served by waiting to see Dr. Antoine Poche on Monday, at which point they can decide which option is best.  She agreed and will f/u on Monday.

## 2012-10-25 ENCOUNTER — Encounter: Payer: Self-pay | Admitting: Physician Assistant

## 2012-10-25 ENCOUNTER — Telehealth: Payer: Self-pay | Admitting: Physician Assistant

## 2012-10-25 ENCOUNTER — Ambulatory Visit (INDEPENDENT_AMBULATORY_CARE_PROVIDER_SITE_OTHER): Payer: Medicare Other | Admitting: Physician Assistant

## 2012-10-25 VITALS — HR 91 | Ht 60.0 in | Wt 83.8 lb

## 2012-10-25 DIAGNOSIS — Z953 Presence of xenogenic heart valve: Secondary | ICD-10-CM

## 2012-10-25 DIAGNOSIS — R627 Adult failure to thrive: Secondary | ICD-10-CM

## 2012-10-25 DIAGNOSIS — I5032 Chronic diastolic (congestive) heart failure: Secondary | ICD-10-CM

## 2012-10-25 DIAGNOSIS — M069 Rheumatoid arthritis, unspecified: Secondary | ICD-10-CM

## 2012-10-25 DIAGNOSIS — I4892 Unspecified atrial flutter: Secondary | ICD-10-CM

## 2012-10-25 DIAGNOSIS — I4891 Unspecified atrial fibrillation: Secondary | ICD-10-CM | POA: Diagnosis not present

## 2012-10-25 DIAGNOSIS — I059 Rheumatic mitral valve disease, unspecified: Secondary | ICD-10-CM

## 2012-10-25 DIAGNOSIS — Z954 Presence of other heart-valve replacement: Secondary | ICD-10-CM

## 2012-10-25 DIAGNOSIS — R0602 Shortness of breath: Secondary | ICD-10-CM

## 2012-10-25 DIAGNOSIS — J449 Chronic obstructive pulmonary disease, unspecified: Secondary | ICD-10-CM

## 2012-10-25 MED ORDER — APIXABAN 2.5 MG PO TABS: 2.5000 mg | ORAL_TABLET | Freq: Two times a day (BID) | ORAL | Status: DC

## 2012-10-25 NOTE — Progress Notes (Signed)
[address] [place], [place]  [address] Phone: [phone], Fax:  [phone]  Date:  10/25/2012   ID:  Nancy Bush, DOB [birth year], MRN [id number]  PCP:  [name], MD  Primary Cardiologist:  Dr. Rollene Rotunda   Primary Electrophysiologist:  Dr. Lewayne Bunting    History of Present Illness: Nancy Bush is a 79 y.o. female who returns for follow up.  She has a hx of diastolic CHF, MVP with MR, rheumatoid arthritis, pulmonary HTN, persistent AFib.  LHC 10/13: LAD 30%, mid RCA 30-40%, EF 65-70%, severe mitral regurgitation, MAC, patent renal arteries bilaterally, abdominal aorta without stenosis or dilatation. She underwent minimally invasive MVR with a bioprosthetic mitral valve and MAZE procedure with Dr. Tressie Stalker in 10/13. Her postoperative course was complicated by significant bradycardia and she underwent permanent pacemaker implantation with Dr. Ladona Ridgel.  She was admitted in 06/2012 with a/c diastolic CHF.  Echo 06/15/12: EF 50-55%, MVR ok, moderate RAE, severe TR, PASP 34.  She was noted to be in AFlutter and was treated with rate control strategy.  I saw her 07/2012 and she was back in NSR.  Last seen by Dr. Antoine Poche 08/11/12.  She had been kept on coumadin.  She was noted to have failure to thrive with poor appetite and weight loss.  Admitted again 1/17-1/21 with increased dyspnea in the setting of AFib with RVR.    I saw her 09/21/12 in f/u.  Her anticoagulation had been d/c'd due to her FTT and weakness.  She was still in NSR when I saw her.  Appetite had improved.  Volume was stable.  She is now d/c from [facility] and is living with her sister.  She brings in a list of her medications that she was d/c on but she stopped most of these.  I am unclear as to why.  It looks like she was on Lexapro, remeron, megace, ativan.  She was also on Daliresp.  She is more short of breath.  She denies orthopnea, PND, edema.  No chest pain.  No  syncope.  She has occasional palpitations but nothing like what she had with her AFib.  She was taken off Xarelto prior to d/c to home.  Labs (11/13): K 3.9, creatinine 0.68, ALT 11 , Hgb 10.3, TSH 1.35 Labs (1/14):   K 4.1, creatinine 0.53, ALT 10, Hgb 10.5, TSH 1.481   Wt Readings from Last 3 Encounters:  10/25/12 83 lb 12.8 oz (38.011 kg)  09/21/12 93 lb 12.8 oz (42.547 kg)  09/09/12 82 lb (37.195 kg)     Past Medical History  Diagnosis Date  . MVP (mitral valve prolapse)   . Pulmonary hypertension     MILD TO MODERATE BY ECHO  . History of chest pain   . Mitral regurgitation     MILD  . Asthma   . Rheumatoid arthritis   . Shortness of breath   . H/O: hysterectomy   . LBBB (left bundle branch block)     per discharge note 2003  . PVC (premature ventricular contraction)   . Atrial fibrillation   . Hypertension   . Heart murmur   . S/P mitral valve replacement with bioprosthetic valve 05/18/2012    27mm Edwards Magna Mitral pericardial bioprosthesis   . S/P Maze operation for atrial fibrillation 05/18/2012    Complete biatrial lesion set using cryothermy with oversewing of LA appendage  . COPD (chronic obstructive pulmonary disease)   . CHF (congestive heart  failure)   . Pacemaker     Current Outpatient Prescriptions  Medication Sig Dispense Refill  . acetaminophen (TYLENOL) 500 MG tablet Take 1 tablet (500 mg total) by mouth every 6 (six) hours as needed.  30 tablet    . albuterol (PROVENTIL) (2.5 MG/3ML) 0.083% nebulizer solution Take 2.5 mg by nebulization every 6 (six) hours as needed.      . Calcium Carbonate-Vitamin D (CALTRATE 600+D) 600-400 MG-UNIT per tablet Take 1 tablet by mouth 2 (two) times daily.      Marland Kitchen escitalopram (LEXAPRO) 20 MG tablet Take 20 mg by mouth daily.      . folic acid (FOLVITE) 1 MG tablet Take 2 mg by mouth daily.       Marland Kitchen levalbuterol (XOPENEX HFA) 45 MCG/ACT inhaler Inhale 2 puffs into the lungs every 6 (six) hours as needed. For wheezing       . loratadine (CLARITIN) 10 MG tablet Take 1 tablet (10 mg total) by mouth daily.      Marland Kitchen LORazepam (ATIVAN) 0.5 MG tablet Take 0.5 mg by mouth every 8 (eight) hours.      . megestrol (MEGACE) 400 MG/10ML suspension Take 10 mLs (400 mg total) by mouth daily.  240 mL    . metoprolol tartrate (LOPRESSOR) 50 MG tablet Take 1 tablet (50 mg total) by mouth 2 (two) times daily.      . mirtazapine (REMERON) 7.5 MG tablet Take 7.5 mg by mouth at bedtime.      . mometasone (NASONEX) 50 MCG/ACT nasal spray Place 2 sprays into the nose daily.      . nitroGLYCERIN (NITROSTAT) 0.4 MG SL tablet Place 1 tablet (0.4 mg total) under the tongue every 5 (five) minutes as needed for chest pain.      . predniSONE (DELTASONE) 2.5 MG tablet Take 2.5 mg by mouth every other day.      . Rivaroxaban (XARELTO) 20 MG TABS Take 20 mg by mouth daily.      . roflumilast (DALIRESP) 500 MCG TABS tablet Take 500 mcg by mouth daily.      . traMADol (ULTRAM) 50 MG tablet Take 50 mg by mouth every 6 (six) hours as needed for pain.       No current facility-administered medications for this visit.    Allergies:    Allergies  Allergen Reactions  . Promethazine Hcl Other (See Comments)    unknown  . Sulfonamide Derivatives Other (See Comments)    unknown  . Tequin Other (See Comments)    hallucinations     Social History:  The patient  reports that she has never smoked. She does not have any smokeless tobacco history on file. She reports that she does not drink alcohol or use illicit drugs.   ROS:  Please see the history of present illness.  She notes some diarrhea.  She also has a cough that she attributes to post nasal drip.  All other systems reviewed and negative.   PHYSICAL EXAM: VS:  Pulse 91  Ht 5' (1.524 m)  Wt 83 lb 12.8 oz (38.011 kg)  BMI 16.37 kg/m2  SpO2 95% Thin, frail, chronically ill-appearing female, in no acute distress HEENT: normal Neck: no JVD at 90 Cardiac:  normal S1, S2; RRR; no  murmur Lungs:  Decreased breath sounds bilaterally, no rales Abd: soft, nontender Ext: no edema Skin: warm and dry Neuro:  CNs 2-12 intact, no focal abnormalities noted  EKG:  NSR, V. Paced, HR 91  ASSESSMENT AND PLAN:  1. Atrial Fibrillation:  She is maintaining sinus rhythm. Her PCP at the SNF has discontinued her anticoagulation and placed on aspirin due to failure to thrive and increasing weakness.  I reviewed with Dr. Rollene Rotunda.  She should be on anticoagulation due to significant risk of thromboembolism.  Calculated creatinine clearance is 36.6 mL/min.  She would take Xarelto 15 mg instead of 20 mg.  We reviewed this vs Eliquis vs warfarin.  Dr. Antoine Poche prefers one of the NOACs.  The patient would rather take Eliquis.  She will be on the 2.5 mg bid dose.  We will give her samples.  Check a BMET and CBC today and repeat in 1 month.   2. Diastolic CHF:  Volume appears to be stable. She notes Class III dyspnea.  I suspect this is related to her COPD.  She does not have signs of volume overload on exam.  I will check a BMET and BNP.  If BNP significantly elevated, I will add low dose Lasix back.   3. Bradycardia, Status Post Pacemaker: Followup with Dr. Ladona Ridgel as planned. 4. Failure to Thrive:  Her appetite had improved. I have asked her to f/u with her PCP regarding whether or not to continue Megace. 5. Mitral Regurgitation:  S/p MVR.  Echo 06/2012 was stable.  6. COPD:  Follow up with PCP to determine what medications to remain on.  I have recommended she stay on Daliresp for now.   7. Depression:  I have recommended she follow up with her PCP to determine what medications she should be taking. 8. Rheumatoid Arthritis:  She was previously on Methotrexate.  She is no longer on this for unclear reasons.  She should f/u with her PCP to determine if she needs to take this drug.  9. Disposition: Follow up with Dr. Antoine Poche in May as planned.  Signed, Tereso Newcomer, PA-C  2:50 PM  10/25/2012

## 2012-10-25 NOTE — Patient Instructions (Addendum)
Go ahead and restart Daliresp. Use Xopenex as needed for shortness of breath or wheezing. If Xopenex is not enough for your shortness of breath, switch to the albuterol nebulizer.  Follow up with [name], MD and discuss with him whether to restart the following medications or stay off of them: Lexapro Remeron Ativan Megace Daliresp Methotrexate   START ELIQUIS 2.5 MG 1 TAB DAILY LABS TODAY; BMET, CBC W/DIFF, BNP  PLEASE HAVE HHRN GET BLOOD WORK 4 WEEK; BMET, CBC W/DIFF, WITH RESULTS TO BE FAXED TO Phillipa Morden, PAC [phone]  KEEP YOUR FOLLOW UP APPT WITH DR. [facility] 12/13/12

## 2012-10-25 NOTE — Telephone Encounter (Signed)
Pt called re: question of taking Eliquis with an artificial valve. She was given samples of Eliquis for atrial fibrillation in the office today. She underwent bioprosthetic MVR last year. Although the anticoagulation is for atrial fibrillation, and not for this valve type, patients with atrial fibrillation caused by valvular disease are at an increased risk of stroke (especially MV causes, high CHADS2 score). Suspect the risk drops off after a stenotic or regurgitant native valve with resultant turbulent blood flow is corrected, however not sure of the timing with which this would occur or if Coumadin is indicated. Advised to continue taking. Will forward to [facility] for further recommendations/confirmation. She understood and agreed.    Jacqulyn Bath, PA-C 10/25/2012 9:21 PM

## 2012-10-26 ENCOUNTER — Telehealth: Payer: Self-pay | Admitting: Cardiology

## 2012-10-26 ENCOUNTER — Other Ambulatory Visit: Payer: Self-pay

## 2012-10-26 DIAGNOSIS — I5032 Chronic diastolic (congestive) heart failure: Secondary | ICD-10-CM

## 2012-10-26 DIAGNOSIS — R0602 Shortness of breath: Secondary | ICD-10-CM

## 2012-10-26 LAB — BASIC METABOLIC PANEL
CO2: 27 mEq/L (ref 19–32)
Chloride: 102 mEq/L (ref 96–112)
Glucose, Bld: 132 mg/dL — ABNORMAL HIGH (ref 70–99)
Potassium: 3.8 mEq/L (ref 3.5–5.1)
Sodium: 135 mEq/L (ref 135–145)

## 2012-10-26 LAB — CBC WITH DIFFERENTIAL/PLATELET
Basophils Absolute: 0 10*3/uL (ref 0.0–0.1)
Basophils Relative: 0.3 % (ref 0.0–3.0)
Eosinophils Absolute: 0.1 10*3/uL (ref 0.0–0.7)
HCT: 32.2 % — ABNORMAL LOW (ref 36.0–46.0)
Hemoglobin: 10.6 g/dL — ABNORMAL LOW (ref 12.0–15.0)
Lymphs Abs: 1.7 10*3/uL (ref 0.7–4.0)
MCHC: 32.8 g/dL (ref 30.0–36.0)
MCV: 91.1 fl (ref 78.0–100.0)
Neutro Abs: 7.4 10*3/uL (ref 1.4–7.7)
RBC: 3.54 Mil/uL — ABNORMAL LOW (ref 3.87–5.11)
RDW: 16.3 % — ABNORMAL HIGH (ref 11.5–14.6)

## 2012-10-26 MED ORDER — FUROSEMIDE 20 MG PO TABS: ORAL_TABLET | ORAL | Status: DC

## 2012-10-26 NOTE — Telephone Encounter (Signed)
Pt was concerned because she took her Eliquis last night about 10:30 pm and she wants to take it a 10 pm.  Instructed pt ok to take at 10 and 10 - just try to keep doses about 12 hours apart.  She states understanding.

## 2012-10-26 NOTE — Telephone Encounter (Signed)
Pt feels like she is having trouble hearing and wonders if its her medications.  Advised to discuss with her pharmacist.  She also states she is going to see an ENT as well.

## 2012-10-26 NOTE — Telephone Encounter (Signed)
New Problem:    Patient called in with a question about her apixaban (ELIQUIS) 2.5 MG TABS tablet.  Please call back.

## 2012-10-26 NOTE — Telephone Encounter (Signed)
New Problem:    Patient called in wanting to ask you a question.  Please call back.

## 2012-10-26 NOTE — Telephone Encounter (Signed)
See below . . Peri Jefferson question.  I have reviewed with Dr. Rollene Rotunda.  He will review further to determine if the NOACs (Xarelto vs Eliquis) are ok to use in her situation vs restarting coumadin.  It is ok to continue the Eliquis for now.  We will be in touch with further recommendations. Tereso Newcomer, PA-C  1:51 PM 10/26/2012

## 2012-10-27 ENCOUNTER — Telehealth: Payer: Self-pay | Admitting: Cardiology

## 2012-10-27 DIAGNOSIS — Z950 Presence of cardiac pacemaker: Secondary | ICD-10-CM | POA: Diagnosis not present

## 2012-10-27 DIAGNOSIS — I509 Heart failure, unspecified: Secondary | ICD-10-CM | POA: Diagnosis not present

## 2012-10-27 DIAGNOSIS — I2789 Other specified pulmonary heart diseases: Secondary | ICD-10-CM | POA: Diagnosis not present

## 2012-10-27 DIAGNOSIS — R262 Difficulty in walking, not elsewhere classified: Secondary | ICD-10-CM | POA: Diagnosis not present

## 2012-10-27 DIAGNOSIS — J449 Chronic obstructive pulmonary disease, unspecified: Secondary | ICD-10-CM | POA: Diagnosis not present

## 2012-10-27 MED ORDER — ROFLUMILAST 500 MCG PO TABS: 500.0000 ug | ORAL_TABLET | Freq: Every day | ORAL | Status: DC

## 2012-10-27 NOTE — Telephone Encounter (Signed)
Scott - do you want to RX this medication?

## 2012-10-27 NOTE — Telephone Encounter (Signed)
Pt aware we can send in a one time RX but she will need to get further refills from her PCP.  He will need to decide if she should continue on this medication or not.  Rx sent into pharmacy

## 2012-10-27 NOTE — Telephone Encounter (Signed)
New Prob   Pt requesting prescription for daliresp. Would like to speak to nurse.

## 2012-10-27 NOTE — Telephone Encounter (Signed)
She was on this at [facility].  She was supposed to continue it. I asked her to continue it until she sees her PCP. If she is running out, we can give her 1 month supply but her PCP will have to refill it after that if he decides she should remain on it. Tereso Newcomer, PA-C  5:03 PM 10/27/2012

## 2012-10-27 NOTE — Telephone Encounter (Signed)
Pt aware to take once a day - better to take during the day when more likely to be up moving around   Pt states understanding

## 2012-10-27 NOTE — Telephone Encounter (Signed)
New Problem:    Patient called in wanting to speak with you about her furosemide (LASIX) 20 MG tablet.  Patient was under the impression that it was supposed to be a smaller dose.  Please call back.

## 2012-10-27 NOTE — Telephone Encounter (Signed)
Pt aware Furosemide 20 mg should be Monday, Wednesday and Friday only.  She states understanding.

## 2012-10-27 NOTE — Telephone Encounter (Signed)
New problem     When should she take daliresp 500 mg . Have some questions.

## 2012-10-28 DIAGNOSIS — I2789 Other specified pulmonary heart diseases: Secondary | ICD-10-CM | POA: Diagnosis not present

## 2012-10-28 DIAGNOSIS — J449 Chronic obstructive pulmonary disease, unspecified: Secondary | ICD-10-CM | POA: Diagnosis not present

## 2012-10-28 DIAGNOSIS — R262 Difficulty in walking, not elsewhere classified: Secondary | ICD-10-CM | POA: Diagnosis not present

## 2012-10-28 DIAGNOSIS — I509 Heart failure, unspecified: Secondary | ICD-10-CM | POA: Diagnosis not present

## 2012-10-28 DIAGNOSIS — Z950 Presence of cardiac pacemaker: Secondary | ICD-10-CM | POA: Diagnosis not present

## 2012-10-29 ENCOUNTER — Ambulatory Visit: Payer: Self-pay | Admitting: Pharmacist

## 2012-10-29 DIAGNOSIS — Z7901 Long term (current) use of anticoagulants: Secondary | ICD-10-CM

## 2012-10-29 DIAGNOSIS — Z950 Presence of cardiac pacemaker: Secondary | ICD-10-CM | POA: Diagnosis not present

## 2012-10-29 DIAGNOSIS — R262 Difficulty in walking, not elsewhere classified: Secondary | ICD-10-CM | POA: Diagnosis not present

## 2012-10-29 DIAGNOSIS — I509 Heart failure, unspecified: Secondary | ICD-10-CM | POA: Diagnosis not present

## 2012-10-29 DIAGNOSIS — I4891 Unspecified atrial fibrillation: Secondary | ICD-10-CM

## 2012-10-29 DIAGNOSIS — J449 Chronic obstructive pulmonary disease, unspecified: Secondary | ICD-10-CM | POA: Diagnosis not present

## 2012-10-29 DIAGNOSIS — I2789 Other specified pulmonary heart diseases: Secondary | ICD-10-CM | POA: Diagnosis not present

## 2012-10-31 DIAGNOSIS — I509 Heart failure, unspecified: Secondary | ICD-10-CM | POA: Diagnosis not present

## 2012-11-01 DIAGNOSIS — J45909 Unspecified asthma, uncomplicated: Secondary | ICD-10-CM | POA: Diagnosis not present

## 2012-11-01 DIAGNOSIS — I509 Heart failure, unspecified: Secondary | ICD-10-CM | POA: Diagnosis not present

## 2012-11-01 DIAGNOSIS — J309 Allergic rhinitis, unspecified: Secondary | ICD-10-CM | POA: Diagnosis not present

## 2012-11-01 DIAGNOSIS — I4891 Unspecified atrial fibrillation: Secondary | ICD-10-CM | POA: Diagnosis not present

## 2012-11-01 DIAGNOSIS — M069 Rheumatoid arthritis, unspecified: Secondary | ICD-10-CM | POA: Diagnosis not present

## 2012-11-02 ENCOUNTER — Telehealth: Payer: Self-pay | Admitting: Cardiology

## 2012-11-02 DIAGNOSIS — I509 Heart failure, unspecified: Secondary | ICD-10-CM | POA: Diagnosis not present

## 2012-11-02 DIAGNOSIS — Z950 Presence of cardiac pacemaker: Secondary | ICD-10-CM | POA: Diagnosis not present

## 2012-11-02 DIAGNOSIS — J449 Chronic obstructive pulmonary disease, unspecified: Secondary | ICD-10-CM | POA: Diagnosis not present

## 2012-11-02 DIAGNOSIS — I2789 Other specified pulmonary heart diseases: Secondary | ICD-10-CM | POA: Diagnosis not present

## 2012-11-02 DIAGNOSIS — R262 Difficulty in walking, not elsewhere classified: Secondary | ICD-10-CM | POA: Diagnosis not present

## 2012-11-02 NOTE — Telephone Encounter (Signed)
Returned call to patient she stated she was on her feet more yesterday and had swelling in ankles.States takes Lasix 20 mg Mon-Wed-Fri.States no swelling this morning.Advised no salt and to elevate legs when sitting down.Patient also wanted to know what mg metoprolol she should be taking.States she was discharged from nursing home 1 week ago and has 50 mg and 25 mg.States she has been taking 25 mg twice a day.Message sent to Dr.Hochrein for advice.

## 2012-11-02 NOTE — Telephone Encounter (Signed)
New problem    C/o ankle was swollen on last night. Discuss metoprolol

## 2012-11-03 NOTE — Telephone Encounter (Signed)
New Prob    Pt has some questions regarding medications. Would like to speak to nurse.

## 2012-11-03 NOTE — Telephone Encounter (Signed)
Tell her to continue the 25 mg bid and correct this dose on the med sheet please.

## 2012-11-04 DIAGNOSIS — J449 Chronic obstructive pulmonary disease, unspecified: Secondary | ICD-10-CM | POA: Diagnosis not present

## 2012-11-04 DIAGNOSIS — I2789 Other specified pulmonary heart diseases: Secondary | ICD-10-CM | POA: Diagnosis not present

## 2012-11-04 DIAGNOSIS — Z950 Presence of cardiac pacemaker: Secondary | ICD-10-CM | POA: Diagnosis not present

## 2012-11-04 DIAGNOSIS — I509 Heart failure, unspecified: Secondary | ICD-10-CM | POA: Diagnosis not present

## 2012-11-04 DIAGNOSIS — R262 Difficulty in walking, not elsewhere classified: Secondary | ICD-10-CM | POA: Diagnosis not present

## 2012-11-05 NOTE — Telephone Encounter (Signed)
Have attempted numerous times to contact pt to no avail.  Will continue to attempt to contact her

## 2012-11-08 ENCOUNTER — Telehealth: Payer: Self-pay | Admitting: Internal Medicine

## 2012-11-08 NOTE — Telephone Encounter (Signed)
Letter is good.  Keep card in your purse

## 2012-11-08 NOTE — Telephone Encounter (Signed)
New Prob   Pt has some questions regarding a letter from [employer]. Would like to speak to nurse.

## 2012-11-08 NOTE — Telephone Encounter (Signed)
Follow up call    Has questions regarding metoprolol.

## 2012-11-08 NOTE — Telephone Encounter (Signed)
PT AWARE TO CONTINUE 25 MG TWICE A DAY.

## 2012-11-09 ENCOUNTER — Ambulatory Visit (INDEPENDENT_AMBULATORY_CARE_PROVIDER_SITE_OTHER): Payer: Medicare Other

## 2012-11-09 DIAGNOSIS — R0602 Shortness of breath: Secondary | ICD-10-CM

## 2012-11-09 DIAGNOSIS — H6980 Other specified disorders of Eustachian tube, unspecified ear: Secondary | ICD-10-CM | POA: Diagnosis not present

## 2012-11-09 DIAGNOSIS — H6520 Chronic serous otitis media, unspecified ear: Secondary | ICD-10-CM | POA: Diagnosis not present

## 2012-11-09 DIAGNOSIS — H908 Mixed conductive and sensorineural hearing loss, unspecified: Secondary | ICD-10-CM | POA: Diagnosis not present

## 2012-11-09 DIAGNOSIS — I5032 Chronic diastolic (congestive) heart failure: Secondary | ICD-10-CM | POA: Diagnosis not present

## 2012-11-10 ENCOUNTER — Encounter: Payer: Self-pay | Admitting: Cardiology

## 2012-11-10 ENCOUNTER — Telehealth: Payer: Self-pay | Admitting: Physician Assistant

## 2012-11-10 DIAGNOSIS — J449 Chronic obstructive pulmonary disease, unspecified: Secondary | ICD-10-CM | POA: Diagnosis not present

## 2012-11-10 DIAGNOSIS — I509 Heart failure, unspecified: Secondary | ICD-10-CM | POA: Diagnosis not present

## 2012-11-10 DIAGNOSIS — I2789 Other specified pulmonary heart diseases: Secondary | ICD-10-CM | POA: Diagnosis not present

## 2012-11-10 DIAGNOSIS — Z950 Presence of cardiac pacemaker: Secondary | ICD-10-CM | POA: Diagnosis not present

## 2012-11-10 DIAGNOSIS — R262 Difficulty in walking, not elsewhere classified: Secondary | ICD-10-CM | POA: Diagnosis not present

## 2012-11-10 LAB — BRAIN NATRIURETIC PEPTIDE: Pro B Natriuretic peptide (BNP): 288 pg/mL — ABNORMAL HIGH (ref 0.0–100.0)

## 2012-11-10 LAB — BASIC METABOLIC PANEL
BUN: 13 mg/dL (ref 6–23)
Creatinine, Ser: 0.7 mg/dL (ref 0.4–1.2)
GFR: 83.28 mL/min (ref >60.00)
Glucose, Bld: 108 mg/dL — ABNORMAL HIGH (ref 70–99)
Potassium: 4.2 mEq/L (ref 3.5–5.1)

## 2012-11-10 NOTE — Telephone Encounter (Signed)
Patient called the answering service re: mild ankle swelling and chest pressure this evening. She has trouble describing her chest pressure. She does not know if it is located centrally or on one side over another. No radiation. No PND, orthopnea or increased DOE/SOB. No palpitations or syncope. She reports that the pressure has improved throughout the evening. She states she has been on her feet more often which she attributes to her ankle swelling. No increased salt intake. She has taken her morning Lasix. Her symptoms are quite vague on speaking with her on the phone. Advised to continue to monitor her symptoms and use NTG SL as prescribed. If ankle swelling or chest pressure becomes worse, advised to take an additional Lasix. She understood and agreed.  Jacqulyn Bath, PA-C 11/10/2012 9:20 PM

## 2012-11-11 ENCOUNTER — Telehealth: Payer: Self-pay | Admitting: Cardiology

## 2012-11-11 DIAGNOSIS — J449 Chronic obstructive pulmonary disease, unspecified: Secondary | ICD-10-CM | POA: Diagnosis not present

## 2012-11-11 DIAGNOSIS — I509 Heart failure, unspecified: Secondary | ICD-10-CM | POA: Diagnosis not present

## 2012-11-11 DIAGNOSIS — I2789 Other specified pulmonary heart diseases: Secondary | ICD-10-CM | POA: Diagnosis not present

## 2012-11-11 DIAGNOSIS — Z950 Presence of cardiac pacemaker: Secondary | ICD-10-CM | POA: Diagnosis not present

## 2012-11-11 DIAGNOSIS — R262 Difficulty in walking, not elsewhere classified: Secondary | ICD-10-CM | POA: Diagnosis not present

## 2012-11-11 NOTE — Telephone Encounter (Signed)
Called patient to report lab results and advise patient to continue to follow current treatment plan per Tereso Newcomer, PA-C.  Patient verbalized understanding and states her ankle swelling has gone down some this morning.  Patient then stated that she had an episode of chest pressure yesterday.  States she did not take NTG and that she called the on-call provider (see his note).  Patient states she does not see Dr. Antoine Poche until May and she is concerned that she may have swelling around her heart.  Advised patient that I would make Dr. Antoine Poche aware.

## 2012-11-12 ENCOUNTER — Telehealth: Payer: Self-pay | Admitting: *Deleted

## 2012-11-12 DIAGNOSIS — Z950 Presence of cardiac pacemaker: Secondary | ICD-10-CM | POA: Diagnosis not present

## 2012-11-12 DIAGNOSIS — I2789 Other specified pulmonary heart diseases: Secondary | ICD-10-CM | POA: Diagnosis not present

## 2012-11-12 DIAGNOSIS — J449 Chronic obstructive pulmonary disease, unspecified: Secondary | ICD-10-CM | POA: Diagnosis not present

## 2012-11-12 DIAGNOSIS — R262 Difficulty in walking, not elsewhere classified: Secondary | ICD-10-CM | POA: Diagnosis not present

## 2012-11-12 DIAGNOSIS — I509 Heart failure, unspecified: Secondary | ICD-10-CM | POA: Diagnosis not present

## 2012-11-12 NOTE — Telephone Encounter (Signed)
lmom ok per Bing Neighbors. PA to take extra lasix if weight increased 2-3 lb's x 1 day, if takes extra lasix needs tcb [phone] Bing Neighbors. PA to let us know

## 2012-11-16 DIAGNOSIS — I509 Heart failure, unspecified: Secondary | ICD-10-CM | POA: Diagnosis not present

## 2012-11-16 DIAGNOSIS — Z950 Presence of cardiac pacemaker: Secondary | ICD-10-CM | POA: Diagnosis not present

## 2012-11-16 DIAGNOSIS — J449 Chronic obstructive pulmonary disease, unspecified: Secondary | ICD-10-CM | POA: Diagnosis not present

## 2012-11-16 DIAGNOSIS — R262 Difficulty in walking, not elsewhere classified: Secondary | ICD-10-CM | POA: Diagnosis not present

## 2012-11-16 DIAGNOSIS — I2789 Other specified pulmonary heart diseases: Secondary | ICD-10-CM | POA: Diagnosis not present

## 2012-11-17 DIAGNOSIS — M069 Rheumatoid arthritis, unspecified: Secondary | ICD-10-CM | POA: Diagnosis not present

## 2012-11-17 DIAGNOSIS — M76899 Other specified enthesopathies of unspecified lower limb, excluding foot: Secondary | ICD-10-CM | POA: Diagnosis not present

## 2012-11-17 DIAGNOSIS — M159 Polyosteoarthritis, unspecified: Secondary | ICD-10-CM | POA: Diagnosis not present

## 2012-11-18 DIAGNOSIS — R198 Other specified symptoms and signs involving the digestive system and abdomen: Secondary | ICD-10-CM | POA: Diagnosis not present

## 2012-11-19 DIAGNOSIS — I509 Heart failure, unspecified: Secondary | ICD-10-CM | POA: Diagnosis not present

## 2012-11-19 DIAGNOSIS — R262 Difficulty in walking, not elsewhere classified: Secondary | ICD-10-CM | POA: Diagnosis not present

## 2012-11-19 DIAGNOSIS — I2789 Other specified pulmonary heart diseases: Secondary | ICD-10-CM | POA: Diagnosis not present

## 2012-11-19 DIAGNOSIS — J449 Chronic obstructive pulmonary disease, unspecified: Secondary | ICD-10-CM | POA: Diagnosis not present

## 2012-11-19 DIAGNOSIS — Z950 Presence of cardiac pacemaker: Secondary | ICD-10-CM | POA: Diagnosis not present

## 2012-11-22 DIAGNOSIS — R262 Difficulty in walking, not elsewhere classified: Secondary | ICD-10-CM | POA: Diagnosis not present

## 2012-11-22 DIAGNOSIS — J449 Chronic obstructive pulmonary disease, unspecified: Secondary | ICD-10-CM | POA: Diagnosis not present

## 2012-11-22 DIAGNOSIS — I2789 Other specified pulmonary heart diseases: Secondary | ICD-10-CM | POA: Diagnosis not present

## 2012-11-22 DIAGNOSIS — I509 Heart failure, unspecified: Secondary | ICD-10-CM | POA: Diagnosis not present

## 2012-11-22 DIAGNOSIS — Z950 Presence of cardiac pacemaker: Secondary | ICD-10-CM | POA: Diagnosis not present

## 2012-11-24 DIAGNOSIS — I509 Heart failure, unspecified: Secondary | ICD-10-CM | POA: Diagnosis not present

## 2012-11-24 DIAGNOSIS — J449 Chronic obstructive pulmonary disease, unspecified: Secondary | ICD-10-CM | POA: Diagnosis not present

## 2012-11-24 DIAGNOSIS — R262 Difficulty in walking, not elsewhere classified: Secondary | ICD-10-CM | POA: Diagnosis not present

## 2012-11-24 DIAGNOSIS — I2789 Other specified pulmonary heart diseases: Secondary | ICD-10-CM | POA: Diagnosis not present

## 2012-11-24 DIAGNOSIS — Z950 Presence of cardiac pacemaker: Secondary | ICD-10-CM | POA: Diagnosis not present

## 2012-11-25 DIAGNOSIS — Z950 Presence of cardiac pacemaker: Secondary | ICD-10-CM | POA: Diagnosis not present

## 2012-11-25 DIAGNOSIS — I509 Heart failure, unspecified: Secondary | ICD-10-CM | POA: Diagnosis not present

## 2012-11-25 DIAGNOSIS — J449 Chronic obstructive pulmonary disease, unspecified: Secondary | ICD-10-CM | POA: Diagnosis not present

## 2012-11-25 DIAGNOSIS — I2789 Other specified pulmonary heart diseases: Secondary | ICD-10-CM | POA: Diagnosis not present

## 2012-11-25 DIAGNOSIS — R262 Difficulty in walking, not elsewhere classified: Secondary | ICD-10-CM | POA: Diagnosis not present

## 2012-11-29 DIAGNOSIS — R262 Difficulty in walking, not elsewhere classified: Secondary | ICD-10-CM | POA: Diagnosis not present

## 2012-11-29 DIAGNOSIS — R197 Diarrhea, unspecified: Secondary | ICD-10-CM | POA: Diagnosis not present

## 2012-11-29 DIAGNOSIS — I509 Heart failure, unspecified: Secondary | ICD-10-CM | POA: Diagnosis not present

## 2012-11-29 DIAGNOSIS — J449 Chronic obstructive pulmonary disease, unspecified: Secondary | ICD-10-CM | POA: Diagnosis not present

## 2012-11-29 DIAGNOSIS — I2789 Other specified pulmonary heart diseases: Secondary | ICD-10-CM | POA: Diagnosis not present

## 2012-11-29 DIAGNOSIS — Z950 Presence of cardiac pacemaker: Secondary | ICD-10-CM | POA: Diagnosis not present

## 2012-12-01 DIAGNOSIS — I509 Heart failure, unspecified: Secondary | ICD-10-CM | POA: Diagnosis not present

## 2012-12-01 DIAGNOSIS — R262 Difficulty in walking, not elsewhere classified: Secondary | ICD-10-CM | POA: Diagnosis not present

## 2012-12-01 DIAGNOSIS — I2789 Other specified pulmonary heart diseases: Secondary | ICD-10-CM | POA: Diagnosis not present

## 2012-12-01 DIAGNOSIS — Z950 Presence of cardiac pacemaker: Secondary | ICD-10-CM | POA: Diagnosis not present

## 2012-12-01 DIAGNOSIS — J449 Chronic obstructive pulmonary disease, unspecified: Secondary | ICD-10-CM | POA: Diagnosis not present

## 2012-12-02 DIAGNOSIS — I509 Heart failure, unspecified: Secondary | ICD-10-CM | POA: Diagnosis not present

## 2012-12-02 DIAGNOSIS — J449 Chronic obstructive pulmonary disease, unspecified: Secondary | ICD-10-CM | POA: Diagnosis not present

## 2012-12-02 DIAGNOSIS — Z950 Presence of cardiac pacemaker: Secondary | ICD-10-CM | POA: Diagnosis not present

## 2012-12-02 DIAGNOSIS — I2789 Other specified pulmonary heart diseases: Secondary | ICD-10-CM | POA: Diagnosis not present

## 2012-12-02 DIAGNOSIS — R262 Difficulty in walking, not elsewhere classified: Secondary | ICD-10-CM | POA: Diagnosis not present

## 2012-12-07 DIAGNOSIS — R262 Difficulty in walking, not elsewhere classified: Secondary | ICD-10-CM | POA: Diagnosis not present

## 2012-12-07 DIAGNOSIS — I509 Heart failure, unspecified: Secondary | ICD-10-CM | POA: Diagnosis not present

## 2012-12-07 DIAGNOSIS — Z950 Presence of cardiac pacemaker: Secondary | ICD-10-CM | POA: Diagnosis not present

## 2012-12-07 DIAGNOSIS — J449 Chronic obstructive pulmonary disease, unspecified: Secondary | ICD-10-CM | POA: Diagnosis not present

## 2012-12-07 DIAGNOSIS — I2789 Other specified pulmonary heart diseases: Secondary | ICD-10-CM | POA: Diagnosis not present

## 2012-12-13 ENCOUNTER — Ambulatory Visit: Payer: Medicare Other | Admitting: Cardiology

## 2012-12-14 DIAGNOSIS — R220 Localized swelling, mass and lump, head: Secondary | ICD-10-CM | POA: Diagnosis not present

## 2012-12-14 DIAGNOSIS — R221 Localized swelling, mass and lump, neck: Secondary | ICD-10-CM | POA: Diagnosis not present

## 2012-12-16 DIAGNOSIS — I2789 Other specified pulmonary heart diseases: Secondary | ICD-10-CM | POA: Diagnosis not present

## 2012-12-16 DIAGNOSIS — R262 Difficulty in walking, not elsewhere classified: Secondary | ICD-10-CM | POA: Diagnosis not present

## 2012-12-16 DIAGNOSIS — Z950 Presence of cardiac pacemaker: Secondary | ICD-10-CM | POA: Diagnosis not present

## 2012-12-16 DIAGNOSIS — I509 Heart failure, unspecified: Secondary | ICD-10-CM | POA: Diagnosis not present

## 2012-12-16 DIAGNOSIS — J449 Chronic obstructive pulmonary disease, unspecified: Secondary | ICD-10-CM | POA: Diagnosis not present

## 2013-01-03 ENCOUNTER — Ambulatory Visit: Payer: Medicare Other | Admitting: Cardiology

## 2013-01-05 ENCOUNTER — Encounter: Payer: Self-pay | Admitting: Physician Assistant

## 2013-01-05 ENCOUNTER — Ambulatory Visit (INDEPENDENT_AMBULATORY_CARE_PROVIDER_SITE_OTHER): Payer: Medicare Other | Admitting: Physician Assistant

## 2013-01-05 VITALS — BP 110/58 | HR 71 | Ht 60.0 in | Wt 89.0 lb

## 2013-01-05 DIAGNOSIS — I5032 Chronic diastolic (congestive) heart failure: Secondary | ICD-10-CM | POA: Diagnosis not present

## 2013-01-05 DIAGNOSIS — I059 Rheumatic mitral valve disease, unspecified: Secondary | ICD-10-CM | POA: Diagnosis not present

## 2013-01-05 DIAGNOSIS — I4891 Unspecified atrial fibrillation: Secondary | ICD-10-CM | POA: Diagnosis not present

## 2013-01-05 DIAGNOSIS — J449 Chronic obstructive pulmonary disease, unspecified: Secondary | ICD-10-CM

## 2013-01-05 DIAGNOSIS — J4489 Other specified chronic obstructive pulmonary disease: Secondary | ICD-10-CM

## 2013-01-05 LAB — BASIC METABOLIC PANEL
BUN: 16 mg/dL (ref 6–23)
CO2: 30 mEq/L (ref 19–32)
Chloride: 104 mEq/L (ref 96–112)
Creatinine, Ser: 1 mg/dL (ref 0.4–1.2)
Glucose, Bld: 83 mg/dL (ref 70–99)
Potassium: 4 mEq/L (ref 3.5–5.1)

## 2013-01-05 LAB — CBC WITH DIFFERENTIAL/PLATELET
Eosinophils Absolute: 0.1 10*3/uL (ref 0.0–0.7)
HCT: 30.9 % — ABNORMAL LOW (ref 36.0–46.0)
Lymphs Abs: 1.5 10*3/uL (ref 0.7–4.0)
MCHC: 33.2 g/dL (ref 30.0–36.0)
MCV: 90 fl (ref 78.0–100.0)
Monocytes Absolute: 0.6 10*3/uL (ref 0.1–1.0)
Neutrophils Relative %: 65.6 % (ref 43.0–77.0)
Platelets: 209 10*3/uL (ref 150.0–400.0)

## 2013-01-05 NOTE — Progress Notes (Signed)
[address] [place], [place]  [address] Phone: [phone], Fax:  [phone]  Date:  01/05/2013   ID:  Nancy Bush, DOB [birth year], MRN [id number]  PCP:  [name], MD  Cardiologist:  Dr. Rollene Rotunda   Electrophysiologist:  Dr. Lewayne Bunting    History of Present Illness: Nancy Bush is a 79 y.o. female who returns for follow up.  She has a hx of diastolic CHF, MVP with MR, rheumatoid arthritis, pulmonary HTN, persistent AFib.  LHC 10/13: LAD 30%, mid RCA 30-40%, EF 65-70%, severe mitral regurgitation, MAC, patent renal arteries bilaterally, abdominal aorta without stenosis or dilatation. She underwent minimally invasive MVR with a bioprosthetic mitral valve and MAZE procedure with Dr. Tressie Stalker in 10/13. Her postoperative course was complicated by significant bradycardia and she underwent permanent pacemaker implantation with Dr. Ladona Ridgel.  She was admitted in 06/2012 with a/c diastolic CHF.  Echo 06/15/12: EF 50-55%, MVR ok, moderate RAE, severe TR, PASP 34.  She was noted to be in AFlutter and was treated with rate control strategy.  I saw her 07/2012 and she was back in NSR.  She had been kept on coumadin.  She was noted to have failure to thrive with poor appetite and weight loss.  Admitted again 08/2012 with increased dyspnea in the setting of AFib with RVR.  Her anticoagulation had been d/c'd due to her FTT and weakness.  She was then d/c from [facility] and is now living with her sister.  I saw her in followup 3/14. She had stopped a lot of her medications that she had previously been taking. She remained in sinus rhythm. I reviewed her case with Dr. Antoine Poche. We opted to place her back Eliquis.  Since last seen, she is doing well. Her appetite is better. She is slowly gaining weight. She denies significant shortness of breath. She is NYHA class IIb.  She has not had to use her albuterol inhaler since starting on Singulair prescribed  by her PCP. She denies orthopnea, PND or significant LE edema. She has occasional chest pains. These are fairly atypical. She denies syncope. She has occasional palpitations. She denies rapid heart beats.  Labs (11/13): K 3.9, creatinine 0.68, ALT 11 , Hgb 10.3, TSH 1.35 Labs (1/14):   K 4.1, creatinine 0.53, ALT 10, Hgb 10.5, TSH 1.481  Labs (3/14):   K 3.8, Cr 0.6, BNP 271 Labs (4/14):   K 4.2, Cr 0.7, BNP 288, Hgb 10.6  Wt Readings from Last 3 Encounters:  01/05/13 89 lb (40.37 kg)  10/25/12 83 lb 12.8 oz (38.011 kg)  09/21/12 93 lb 12.8 oz (42.547 kg)     Past Medical History  Diagnosis Date  . MVP (mitral valve prolapse)   . Pulmonary hypertension     MILD TO MODERATE BY ECHO  . History of chest pain   . Mitral regurgitation     MILD  . Asthma   . Rheumatoid arthritis(714.0)   . Shortness of breath   . H/O: hysterectomy   . LBBB (left bundle branch block)     per discharge note 2003  . PVC (premature ventricular contraction)   . Atrial fibrillation   . Hypertension   . Heart murmur   . S/P mitral valve replacement with bioprosthetic valve 05/18/2012    27mm Edwards Magna Mitral pericardial bioprosthesis   . S/P Maze operation for atrial fibrillation 05/18/2012    Complete biatrial lesion set using cryothermy with oversewing of LA appendage  .  COPD (chronic obstructive pulmonary disease)   . CHF (congestive heart failure)   . Pacemaker     Current Outpatient Prescriptions  Medication Sig Dispense Refill  . acetaminophen (TYLENOL) 500 MG tablet Take 1 tablet (500 mg total) by mouth every 6 (six) hours as needed.  30 tablet    . albuterol (PROVENTIL) (2.5 MG/3ML) 0.083% nebulizer solution Take 2.5 mg by nebulization every 6 (six) hours as needed.      Marland Kitchen apixaban (ELIQUIS) 2.5 MG TABS tablet Take 1 tablet (2.5 mg total) by mouth 2 (two) times daily.  60 tablet  11  . Calcium Carbonate-Vitamin D (CALTRATE 600+D) 600-400 MG-UNIT per tablet Take 1 tablet by mouth 2 (two)  times daily.      . folic acid (FOLVITE) 1 MG tablet Take 2 mg by mouth daily.       . furosemide (LASIX) 20 MG tablet Take one tablet (20 mg) by mouth on Monday, Wednesday and Friday only.  30 tablet  3  . levalbuterol (XOPENEX HFA) 45 MCG/ACT inhaler Inhale 2 puffs into the lungs every 6 (six) hours as needed. For wheezing      . methotrexate (RHEUMATREX) 2.5 MG tablet Take 2.5 mg by mouth once a week.       . metoprolol tartrate (LOPRESSOR) 50 MG tablet Take 1 tablet (50 mg total) by mouth 2 (two) times daily.      . montelukast (SINGULAIR) 10 MG tablet Take 10 mg by mouth at bedtime.       Marland Kitchen NASONEX 50 MCG/ACT nasal spray Place 2 sprays into the nose daily.       . nitroGLYCERIN (NITROSTAT) 0.4 MG SL tablet Place 1 tablet (0.4 mg total) under the tongue every 5 (five) minutes as needed for chest pain.      . predniSONE (DELTASONE) 2.5 MG tablet Take 2.5 mg by mouth every other day.       No current facility-administered medications for this visit.    Allergies:    Allergies  Allergen Reactions  . Promethazine Hcl Other (See Comments)    unknown  . Sulfonamide Derivatives Other (See Comments)    unknown  . Tequin Other (See Comments)    hallucinations     Social History:  The patient  reports that she has never smoked. She does not have any smokeless tobacco history on file. She reports that she does not drink alcohol or use illicit drugs.   ROS:  Please see the history of present illness.  She notes scant BRBPR today on her stool.  She did strain some and she has hemorrhoids.  No melena.  All other systems reviewed and negative.   PHYSICAL EXAM: VS:  BP 110/58  Pulse 71  Ht 5' (1.524 m)  Wt 89 lb (40.37 kg)  BMI 17.38 kg/m2 Thin, frail, female, in no acute distress HEENT: normal Neck: no JVD  Cardiac:  normal S1, S2; irregular rhythm,  no murmur Lungs:  Clear to auscultation bilaterally, no rales or wheezing Abd: soft, nontender Ext: no edema Skin: warm and dry Neuro:   CNs 2-12 intact, no focal abnormalities noted  EKG:     V paced, probable underlying AFib, HR 71  ASSESSMENT AND PLAN:  1. Atrial Fibrillation:  She is V. Paced. She probably has underlying atrial fibrillation versus flutter. Her rate is controlled. She is not that symptomatic with this. She is on Eliquis. Continue current therapy.  Check a CBC today. 2. Diastolic CHF:  Volume appears  stable. Her breathing is improved. Check a basic metabolic panel today.   3. Bradycardia, Status Post Pacemaker:  Followup with Dr. Ladona Ridgel as planned. 4. Failure to Thrive:  Her appetite had improved. Her weight is slowly increasing.. 5. Mitral Regurgitation:  S/p MVR.  Echo 06/2012 was stable.  6. COPD:  Followup with PCP.   7. Rectal Bleeding: She had a scant amount of what sounds like hemorrhoidal bleeding this morning. Check a CBC. I have asked her to monitor this and notify us or her PCP if she sees blood again. 8. Disposition: Follow up with Dr. Antoine Poche in 3 months.  Signed, Tereso Newcomer, PA-C  2:41 PM 01/05/2013

## 2013-01-05 NOTE — Patient Instructions (Addendum)
NO MEDICATION CHANGES WERE MADE TODAY  LABS TODAY; BMET, CBC W/DIFF  PLEASE FOLLOW UP WITH DR. HOCHREIN 04/06/13 @ 1:45

## 2013-01-06 ENCOUNTER — Telehealth: Payer: Self-pay | Admitting: *Deleted

## 2013-01-06 NOTE — Telephone Encounter (Signed)
Message copied by Tarri Fuller on Thu Jan 06, 2013 11:25 AM ------      Message from: [place], [place] T      Created: Thu Jan 06, 2013  8:36 AM       K+ and creatinine ok      Hgb stable      Continue current Rx      Tereso Newcomer, PA-C        01/06/2013 8:36 AM ------

## 2013-01-06 NOTE — Telephone Encounter (Signed)
I will try again later

## 2013-01-06 NOTE — Telephone Encounter (Signed)
s/w pt notified about her lab results with verbal understanding

## 2013-01-06 NOTE — Telephone Encounter (Signed)
I s/w pt's sister Ila who states pt is staying at other sisters house [facility]. I tried [facility] but line busy. I will try agin tomorrow

## 2013-01-11 DIAGNOSIS — M1710 Unilateral primary osteoarthritis, unspecified knee: Secondary | ICD-10-CM | POA: Diagnosis not present

## 2013-01-11 DIAGNOSIS — H6980 Other specified disorders of Eustachian tube, unspecified ear: Secondary | ICD-10-CM | POA: Diagnosis not present

## 2013-01-11 DIAGNOSIS — H6520 Chronic serous otitis media, unspecified ear: Secondary | ICD-10-CM | POA: Diagnosis not present

## 2013-01-20 ENCOUNTER — Other Ambulatory Visit: Payer: Self-pay

## 2013-01-20 DIAGNOSIS — Z1231 Encounter for screening mammogram for malignant neoplasm of breast: Secondary | ICD-10-CM

## 2013-01-31 ENCOUNTER — Telehealth: Payer: Self-pay | Admitting: Cardiology

## 2013-01-31 NOTE — Telephone Encounter (Signed)
Line busy

## 2013-01-31 NOTE — Telephone Encounter (Signed)
New Problem:    Patient called in wanting to know about going to the dentist.  Please call back.

## 2013-02-01 NOTE — Telephone Encounter (Signed)
Line continues to ring busy  

## 2013-02-02 NOTE — Telephone Encounter (Signed)
NOTE!!!!!!!!  LINE ONLY RINGS BUSY - IF PT CALLS BACK PLEASE VERIFY THE PHONE NUMBER SHE WANTS TO BE CALLED BACK ON!!!! THANK YOU

## 2013-02-02 NOTE — Telephone Encounter (Signed)
Follow up  Pt is calling regarding going to the dentist.

## 2013-02-03 NOTE — Telephone Encounter (Signed)
Pt aware.

## 2013-02-03 NOTE — Telephone Encounter (Signed)
No need for SBE.

## 2013-02-03 NOTE — Telephone Encounter (Signed)
Pt has a broken tooth and needs to know if she needs SBE.  States she has not needed in the past.  Will forward to MD to double check

## 2013-02-03 NOTE — Telephone Encounter (Signed)
Left another message for pt to call back.

## 2013-02-03 NOTE — Telephone Encounter (Signed)
New Prob     Pt wants to know if its ok for her to have some dental work done. Please call.

## 2013-02-16 DIAGNOSIS — M069 Rheumatoid arthritis, unspecified: Secondary | ICD-10-CM | POA: Diagnosis not present

## 2013-02-28 ENCOUNTER — Ambulatory Visit
Admission: RE | Admit: 2013-02-28 | Discharge: 2013-02-28 | Disposition: A | Discharge status: Home / Self Care | Payer: Medicare Other | Source: Ambulatory Visit

## 2013-02-28 DIAGNOSIS — Z1231 Encounter for screening mammogram for malignant neoplasm of breast: Secondary | ICD-10-CM | POA: Diagnosis not present

## 2013-04-06 ENCOUNTER — Ambulatory Visit (INDEPENDENT_AMBULATORY_CARE_PROVIDER_SITE_OTHER): Payer: Medicare Other | Admitting: Cardiology

## 2013-04-06 ENCOUNTER — Encounter: Payer: Self-pay | Admitting: Cardiology

## 2013-04-06 VITALS — BP 98/46 | HR 70 | Ht 59.0 in | Wt 95.4 lb

## 2013-04-06 DIAGNOSIS — I4891 Unspecified atrial fibrillation: Secondary | ICD-10-CM

## 2013-04-06 DIAGNOSIS — I059 Rheumatic mitral valve disease, unspecified: Secondary | ICD-10-CM

## 2013-04-06 DIAGNOSIS — I359 Nonrheumatic aortic valve disorder, unspecified: Secondary | ICD-10-CM

## 2013-04-06 DIAGNOSIS — R0602 Shortness of breath: Secondary | ICD-10-CM | POA: Diagnosis not present

## 2013-04-06 DIAGNOSIS — I4892 Unspecified atrial flutter: Secondary | ICD-10-CM | POA: Diagnosis not present

## 2013-04-06 DIAGNOSIS — R5381 Other malaise: Secondary | ICD-10-CM | POA: Diagnosis not present

## 2013-04-06 DIAGNOSIS — R5383 Other fatigue: Secondary | ICD-10-CM

## 2013-04-06 LAB — CBC
HCT: 29 % — ABNORMAL LOW (ref 36.0–46.0)
Hemoglobin: 9.7 g/dL — ABNORMAL LOW (ref 12.0–15.0)
MCV: 92.2 fl (ref 78.0–100.0)
RDW: 16.4 % — ABNORMAL HIGH (ref 11.5–14.6)
WBC: 5.4 10*3/uL (ref 4.5–10.5)

## 2013-04-06 LAB — TSH: TSH: 1.1 u[IU]/mL (ref 0.35–5.50)

## 2013-04-06 NOTE — Progress Notes (Signed)
HPI  The patient presents for followup of mitral valve replacement. She had a bioprosthetic mitral valve and a Maze procedure in October of last year. She's also had pacemaker placement for bradycardia. She's had underlying atrial fibrillation. She unfortunately had a complicated hospital course predominantly with failure to thrive and weight loss. She's no longer at her nursing home but is staying with her sister. She still predominantly complains of fatigue. She's not having any shortness of breath, PND or orthopnea. She's not noticing any palpitations but she never really did. She takes her pulse and notices it to be regular. She's not describing any chest pressure, neck or arm discomfort. She gets around with a cane. She has had some right hand numbness.   Allergies  Allergen Reactions  . Promethazine Hcl Other (See Comments)    unknown  . Sulfonamide Derivatives Other (See Comments)    unknown  . Tequin Other (See Comments)    hallucinations     Current Outpatient Prescriptions  Medication Sig Dispense Refill  . acetaminophen (TYLENOL) 500 MG tablet Take 1 tablet (500 mg total) by mouth every 6 (six) hours as needed.  30 tablet    . albuterol (PROVENTIL) (2.5 MG/3ML) 0.083% nebulizer solution Take 2.5 mg by nebulization every 6 (six) hours as needed.      Marland Kitchen apixaban (ELIQUIS) 2.5 MG TABS tablet Take 1 tablet (2.5 mg total) by mouth 2 (two) times daily.  60 tablet  11  . Calcium Carbonate-Vitamin D (CALTRATE 600+D) 600-400 MG-UNIT per tablet Take 1 tablet by mouth 2 (two) times daily.      . folic acid (FOLVITE) 1 MG tablet Take 2 mg by mouth daily.       . furosemide (LASIX) 20 MG tablet Take one tablet (20 mg) by mouth on Monday, Wednesday and Friday only.  30 tablet  3  . levalbuterol (XOPENEX HFA) 45 MCG/ACT inhaler Inhale 2 puffs into the lungs every 6 (six) hours as needed. For wheezing      . methotrexate (RHEUMATREX) 2.5 MG tablet Take 2.5 mg by mouth once a week.       .  metoprolol tartrate (LOPRESSOR) 50 MG tablet Take 1 tablet (50 mg total) by mouth 2 (two) times daily.      . montelukast (SINGULAIR) 10 MG tablet Take 10 mg by mouth at bedtime.       Marland Kitchen NASONEX 50 MCG/ACT nasal spray Place 2 sprays into the nose daily.       . nitroGLYCERIN (NITROSTAT) 0.4 MG SL tablet Place 1 tablet (0.4 mg total) under the tongue every 5 (five) minutes as needed for chest pain.      . predniSONE (DELTASONE) 2.5 MG tablet Take 2.5 mg by mouth every other day.       No current facility-administered medications for this visit.    Past Medical History  Diagnosis Date  . MVP (mitral valve prolapse)   . Pulmonary hypertension     MILD TO MODERATE BY ECHO  . History of chest pain   . Mitral regurgitation     MILD  . Asthma   . Rheumatoid arthritis(714.0)   . Shortness of breath   . H/O: hysterectomy   . LBBB (left bundle branch block)     per discharge note 2003  . PVC (premature ventricular contraction)   . Atrial fibrillation   . Hypertension   . Heart murmur   . S/P mitral valve replacement with bioprosthetic valve 05/18/2012  27mm Edwards Magna Mitral pericardial bioprosthesis   . S/P Maze operation for atrial fibrillation 05/18/2012    Complete biatrial lesion set using cryothermy with oversewing of LA appendage  . COPD (chronic obstructive pulmonary disease)   . CHF (congestive heart failure)   . Pacemaker     Past Surgical History  Procedure Laterality Date  . Cholecystectomy    . Ovarian cyst removal    . Hysterectomy    . Appendectomy    . Cataract extraction    . Tee without cardioversion  02/09/2012    Procedure: TRANSESOPHAGEAL ECHOCARDIOGRAM (TEE);  Surgeon: Pricilla Riffle, MD;  Location: [facility] ENDOSCOPY;  Service: Cardiovascular;  Laterality: N/A;  . Maze  05/18/2012    Procedure: MAZE;  Surgeon: Purcell Nails, MD;  Location: [facility] OR;  Service: Open Heart Surgery;  Laterality: Right;  . Mitral valve replacement  05/18/2012    Procedure: MINIMALLY  INVASIVE MITRAL VALVE (MV) REPLACEMENT;  Surgeon: Purcell Nails, MD;  Location: [facility] OR;  Service: Open Heart Surgery;  Laterality: N/A;  . Pacemaker insertion    . Abdominal hysterectomy     ROS:  As stated in the HPI and negative for all other systems.  GENERAL:  Well appearing BP 98/46  Pulse 70  Ht 4\' 11"  (1.499 m)  Wt 95 lb 6.4 oz (43.273 kg)  BMI 19.26 kg/m2  HEENT:  Pupils equal round and reactive, fundi not visualized, oral mucosa unremarkable NECK:  No jugular venous distention, waveform within normal limits, carotid upstroke brisk and symmetric, no bruits, no thyromegaly LYMPHATICS:  No cervical, inguinal adenopathy LUNGS:  Clear to auscultation bilaterally BACK:  No CVA tenderness CHEST:  Unremarkable, well healed left thoracotomy scar HEART:  PMI not displaced or sustained,S1 and S2 within normal limits, no S3, no S4, no clicks, no rubs, no murmurs ABD:  Flat, positive bowel sounds normal in frequency in pitch, no bruits, no rebound, no guarding, no midline pulsatile mass, no hepatomegaly, no splenomegaly EXT:  2 plus pulses throughout, no edema, no cyanosis no clubbing SKIN:  No rashes no nodules NEURO:  Cranial nerves II through XII grossly intact, motor grossly intact throughout PSYCH:  Cognitively intact, oriented to person place and time  EKG:  Atypical atrial flutter with ventricular pacing  ASSESSMENT AND PLAN  MVR:  I did clarify with the patient and she needs endocarditis prophylaxis. I will check an echocardiogram in November as it will be one year since the previous. Otherwise I think she is starting to recover from this surgery and no change in therapy is indicated.  ATRIAL FIB/FLUTTER:  She is in this rhythm but does not notice it. She tolerates anticoagulation. No change in therapy is indicated.  HYPOTENSION:  Her blood pressure is running low but I am continuing the low dose of beta blocker for rate control. She will otherwise continue the meds as  listed  FATIGUE:  I will be checking a TSH and CBC today particularly because of the fatigue but also with her anticoagulation.

## 2013-04-06 NOTE — Patient Instructions (Addendum)
The current medical regimen is effective;  continue present plan and medications.  Please have blood work today (CBC and TSH)  Your physician has requested that you have an echocardiogram in November 2014. Echocardiography is a painless test that uses sound waves to create images of your heart. It provides your doctor with information about the size and shape of your heart and how well your heart's chambers and valves are working. This procedure takes approximately one hour. There are no restrictions for this procedure.  Follow up in 6 months with Dr Antoine Poche.  You will receive a letter in the mail 2 months before you are due.  Please call us when you receive this letter to schedule your follow up appointment.

## 2013-04-27 ENCOUNTER — Telehealth: Payer: Self-pay | Admitting: Internal Medicine

## 2013-04-27 NOTE — Telephone Encounter (Signed)
New Problem  Pt just received a new cordless home phone that is digital.. It advises those with pacemakers to not use... Pt wants to know if it will be safe to use.

## 2013-04-27 NOTE — Telephone Encounter (Signed)
Pt called in regards to cordless telephone and having ppm. Returned call but n/A. Left message on machine with tech services number to [employer]. Pt advised to call and speak to someone there.

## 2013-04-28 ENCOUNTER — Telehealth: Payer: Self-pay | Admitting: Internal Medicine

## 2013-04-28 NOTE — Telephone Encounter (Signed)
New problem     Pt need to speak to nurse.. She has some questions. Please call pt

## 2013-04-29 NOTE — Telephone Encounter (Signed)
Pt had questions about using a new cordless phone with ppm. Instructed to use phone on opposite side of ppm. Pt aware.

## 2013-05-19 DIAGNOSIS — M76899 Other specified enthesopathies of unspecified lower limb, excluding foot: Secondary | ICD-10-CM | POA: Diagnosis not present

## 2013-05-19 DIAGNOSIS — M159 Polyosteoarthritis, unspecified: Secondary | ICD-10-CM | POA: Diagnosis not present

## 2013-05-19 DIAGNOSIS — M069 Rheumatoid arthritis, unspecified: Secondary | ICD-10-CM | POA: Diagnosis not present

## 2013-05-31 ENCOUNTER — Encounter: Payer: Self-pay | Admitting: Internal Medicine

## 2013-05-31 ENCOUNTER — Ambulatory Visit (INDEPENDENT_AMBULATORY_CARE_PROVIDER_SITE_OTHER): Payer: Medicare Other | Admitting: Internal Medicine

## 2013-05-31 VITALS — BP 110/50 | HR 76 | Ht 59.0 in | Wt 96.0 lb

## 2013-05-31 DIAGNOSIS — Z950 Presence of cardiac pacemaker: Secondary | ICD-10-CM | POA: Diagnosis not present

## 2013-05-31 DIAGNOSIS — I441 Atrioventricular block, second degree: Secondary | ICD-10-CM | POA: Diagnosis not present

## 2013-05-31 DIAGNOSIS — I4891 Unspecified atrial fibrillation: Secondary | ICD-10-CM | POA: Diagnosis not present

## 2013-05-31 LAB — PACEMAKER DEVICE OBSERVATION
AL AMPLITUDE: 2.3 mv
AL IMPEDENCE PM: 478 Ohm
RV LEAD IMPEDENCE PM: 777 Ohm
RV LEAD THRESHOLD: 0.7 V

## 2013-05-31 NOTE — Progress Notes (Signed)
HPI Nancy Bush is here today for followup. She has a history of chronic diastolic heart failure, and mitral regurgitation status post mitral valve replacement, atrial arrhythmias status post maze procedure, and failure to thrive. She is s/p PPM insertion. She's finally begun to feel better. When I saw her last she was failing to thrive and losing weight. She has gradually begun to eat better and ambulate more. She denies chest pain or shortness of breath. She does have fatigue and weakness. Allergies  Allergen Reactions  . Promethazine Hcl Other (See Comments)    unknown  . Sulfonamide Derivatives Other (See Comments)    unknown  . Tequin Other (See Comments)    hallucinations      Current Outpatient Prescriptions  Medication Sig Dispense Refill  . acetaminophen (TYLENOL) 500 MG tablet Take 1 tablet (500 mg total) by mouth every 6 (six) hours as needed.  30 tablet    . albuterol (PROVENTIL) (2.5 MG/3ML) 0.083% nebulizer solution Take 2.5 mg by nebulization every 6 (six) hours as needed.      Marland Kitchen apixaban (ELIQUIS) 2.5 MG TABS tablet Take 1 tablet (2.5 mg total) by mouth 2 (two) times daily.  60 tablet  11  . Calcium Carbonate-Vitamin D (CALTRATE 600+D) 600-400 MG-UNIT per tablet Take 1 tablet by mouth 2 (two) times daily.      . folic acid (FOLVITE) 1 MG tablet Take 2 mg by mouth daily.       . furosemide (LASIX) 20 MG tablet Take one tablet (20 mg) by mouth on Monday, Wednesday and Friday only.  30 tablet  3  . levalbuterol (XOPENEX HFA) 45 MCG/ACT inhaler Inhale 2 puffs into the lungs every 6 (six) hours as needed. For wheezing      . metoprolol succinate (TOPROL-XL) 25 MG 24 hr tablet Take 25 mg by mouth 2 (two) times daily.       . montelukast (SINGULAIR) 10 MG tablet Take 10 mg by mouth at bedtime.       Marland Kitchen NASONEX 50 MCG/ACT nasal spray Place 2 sprays into the nose daily.       . nitroGLYCERIN (NITROSTAT) 0.4 MG SL tablet Place 1 tablet (0.4 mg total) under the tongue every 5 (five)  minutes as needed for chest pain.      . predniSONE (DELTASONE) 2.5 MG tablet Take 2.5 mg by mouth every other day.       No current facility-administered medications for this visit.     Past Medical History  Diagnosis Date  . MVP (mitral valve prolapse)   . Pulmonary hypertension     MILD TO MODERATE BY ECHO  . History of chest pain   . Mitral regurgitation     MILD  . Asthma   . Rheumatoid arthritis(714.0)   . Shortness of breath   . H/O: hysterectomy   . LBBB (left bundle branch block)     per discharge note 2003  . PVC (premature ventricular contraction)   . Atrial fibrillation   . Hypertension   . Heart murmur   . S/P mitral valve replacement with bioprosthetic valve 05/18/2012    27mm Edwards Magna Mitral pericardial bioprosthesis   . S/P Maze operation for atrial fibrillation 05/18/2012    Complete biatrial lesion set using cryothermy with oversewing of LA appendage  . COPD (chronic obstructive pulmonary disease)   . CHF (congestive heart failure)   . Pacemaker     ROS:   All systems reviewed and negative except as noted  in the HPI.   Past Surgical History  Procedure Laterality Date  . Cholecystectomy    . Ovarian cyst removal    . Hysterectomy    . Appendectomy    . Cataract extraction    . Tee without cardioversion  02/09/2012    Procedure: TRANSESOPHAGEAL ECHOCARDIOGRAM (TEE);  Surgeon: Pricilla Riffle, MD;  Location: [facility] ENDOSCOPY;  Service: Cardiovascular;  Laterality: N/A;  . Maze  05/18/2012    Procedure: MAZE;  Surgeon: Purcell Nails, MD;  Location: [facility] OR;  Service: Open Heart Surgery;  Laterality: Right;  . Mitral valve replacement  05/18/2012    Procedure: MINIMALLY INVASIVE MITRAL VALVE (MV) REPLACEMENT;  Surgeon: Purcell Nails, MD;  Location: [facility] OR;  Service: Open Heart Surgery;  Laterality: N/A;  . Pacemaker insertion    . Abdominal hysterectomy       History reviewed. No pertinent family history.   History   Social History  . Marital  Status: Single    Spouse Name: N/A    Number of Children: N/A  . Years of Education: N/A   Occupational History  . Not on file.   Social History Main Topics  . Smoking status: Never Smoker   . Smokeless tobacco: Not on file  . Alcohol Use: No  . Drug Use: No  . Sexual Activity: Not Currently    Birth Control/ Protection: Post-menopausal   Other Topics Concern  . Not on file   Social History Narrative  . No narrative on file     BP 110/50  Pulse 76  Ht 4\' 11"  (1.499 m)  Wt 96 lb (43.545 kg)  BMI 19.38 kg/m2  Physical Exam:  stable appearing elderly woman, NAD HEENT: Unremarkable Neck:  6 cm JVD, no thyromegally Lungs:  Clear with minimal basilar rales. No wheezes or rhonchi. HEART:  Regular rate rhythm, no murmurs, no rubs, no clicks Abd:  soft, positive bowel sounds, no organomegally, no rebound, no guarding Ext:  2 plus pulses, no edema, no cyanosis, no clubbing Skin:  No rashes no nodules Neuro:  CN II through XII intact, motor grossly intact  DEVICE  Normal device function.  See PaceArt for details.   Assess/Plan:

## 2013-05-31 NOTE — Assessment & Plan Note (Signed)
Her [place] PPM is working normally. Will recheck in several months.

## 2013-05-31 NOTE — Assessment & Plan Note (Signed)
She was previously in atrial fib and tachycardia but has lately maintained NSR. No change in medical therapy.

## 2013-05-31 NOTE — Patient Instructions (Signed)
Your physician wants you to follow-up in: 6 months with device clinic and 12 months with Dr Taylor You will receive a reminder letter in the mail two months in advance. If you don't receive a letter, please call our office to schedule the follow-up appointment.  

## 2013-06-08 ENCOUNTER — Other Ambulatory Visit: Payer: Self-pay | Admitting: Cardiology

## 2013-06-17 DIAGNOSIS — Z23 Encounter for immunization: Secondary | ICD-10-CM | POA: Diagnosis not present

## 2013-06-21 ENCOUNTER — Encounter: Payer: Self-pay | Admitting: Cardiovascular Disease

## 2013-06-21 ENCOUNTER — Other Ambulatory Visit: Payer: Self-pay

## 2013-06-21 ENCOUNTER — Ambulatory Visit (HOSPITAL_COMMUNITY): Payer: Medicare Other | Attending: Cardiology | Admitting: Radiology

## 2013-06-21 ENCOUNTER — Other Ambulatory Visit (HOSPITAL_COMMUNITY): Payer: Self-pay | Admitting: Cardiology

## 2013-06-21 DIAGNOSIS — I4892 Unspecified atrial flutter: Secondary | ICD-10-CM | POA: Insufficient documentation

## 2013-06-21 DIAGNOSIS — I498 Other specified cardiac arrhythmias: Secondary | ICD-10-CM | POA: Diagnosis not present

## 2013-06-21 DIAGNOSIS — J4489 Other specified chronic obstructive pulmonary disease: Secondary | ICD-10-CM | POA: Insufficient documentation

## 2013-06-21 DIAGNOSIS — I059 Rheumatic mitral valve disease, unspecified: Secondary | ICD-10-CM

## 2013-06-21 DIAGNOSIS — I4891 Unspecified atrial fibrillation: Secondary | ICD-10-CM | POA: Insufficient documentation

## 2013-06-21 DIAGNOSIS — I2789 Other specified pulmonary heart diseases: Secondary | ICD-10-CM | POA: Insufficient documentation

## 2013-06-21 DIAGNOSIS — N189 Chronic kidney disease, unspecified: Secondary | ICD-10-CM | POA: Insufficient documentation

## 2013-06-21 DIAGNOSIS — J449 Chronic obstructive pulmonary disease, unspecified: Secondary | ICD-10-CM | POA: Insufficient documentation

## 2013-06-21 DIAGNOSIS — I079 Rheumatic tricuspid valve disease, unspecified: Secondary | ICD-10-CM | POA: Diagnosis not present

## 2013-06-21 DIAGNOSIS — J90 Pleural effusion, not elsewhere classified: Secondary | ICD-10-CM | POA: Diagnosis not present

## 2013-06-21 DIAGNOSIS — R0602 Shortness of breath: Secondary | ICD-10-CM | POA: Diagnosis not present

## 2013-06-21 DIAGNOSIS — R079 Chest pain, unspecified: Secondary | ICD-10-CM | POA: Diagnosis not present

## 2013-06-21 NOTE — Progress Notes (Signed)
Echocardiogram performed.  

## 2013-07-30 DIAGNOSIS — J069 Acute upper respiratory infection, unspecified: Secondary | ICD-10-CM | POA: Diagnosis not present

## 2013-08-05 DIAGNOSIS — R05 Cough: Secondary | ICD-10-CM | POA: Diagnosis not present

## 2013-08-05 DIAGNOSIS — R059 Cough, unspecified: Secondary | ICD-10-CM | POA: Diagnosis not present

## 2013-08-15 ENCOUNTER — Other Ambulatory Visit: Payer: Self-pay | Admitting: Physician Assistant

## 2013-08-24 DIAGNOSIS — M069 Rheumatoid arthritis, unspecified: Secondary | ICD-10-CM | POA: Diagnosis not present

## 2013-10-07 ENCOUNTER — Telehealth: Payer: Self-pay | Admitting: Cardiology

## 2013-10-07 ENCOUNTER — Other Ambulatory Visit: Payer: Self-pay

## 2013-10-07 DIAGNOSIS — I4891 Unspecified atrial fibrillation: Secondary | ICD-10-CM

## 2013-10-07 MED ORDER — APIXABAN 2.5 MG PO TABS: 2.5000 mg | ORAL_TABLET | Freq: Two times a day (BID) | ORAL | Status: DC

## 2013-11-03 ENCOUNTER — Encounter: Payer: Self-pay | Admitting: Internal Medicine

## 2013-11-11 DIAGNOSIS — D649 Anemia, unspecified: Secondary | ICD-10-CM | POA: Diagnosis not present

## 2013-11-11 DIAGNOSIS — I4891 Unspecified atrial fibrillation: Secondary | ICD-10-CM | POA: Diagnosis not present

## 2013-11-11 DIAGNOSIS — R5381 Other malaise: Secondary | ICD-10-CM | POA: Diagnosis not present

## 2013-11-11 DIAGNOSIS — R5383 Other fatigue: Secondary | ICD-10-CM | POA: Diagnosis not present

## 2013-11-22 DIAGNOSIS — D649 Anemia, unspecified: Secondary | ICD-10-CM | POA: Diagnosis not present

## 2013-11-22 DIAGNOSIS — J45909 Unspecified asthma, uncomplicated: Secondary | ICD-10-CM | POA: Diagnosis not present

## 2013-11-23 DIAGNOSIS — M159 Polyosteoarthritis, unspecified: Secondary | ICD-10-CM | POA: Diagnosis not present

## 2013-11-23 DIAGNOSIS — M76899 Other specified enthesopathies of unspecified lower limb, excluding foot: Secondary | ICD-10-CM | POA: Diagnosis not present

## 2013-11-23 DIAGNOSIS — M069 Rheumatoid arthritis, unspecified: Secondary | ICD-10-CM | POA: Diagnosis not present

## 2013-11-30 ENCOUNTER — Encounter: Payer: Self-pay | Admitting: Internal Medicine

## 2013-11-30 ENCOUNTER — Ambulatory Visit (INDEPENDENT_AMBULATORY_CARE_PROVIDER_SITE_OTHER): Payer: Medicare Other | Admitting: *Deleted

## 2013-11-30 DIAGNOSIS — I4891 Unspecified atrial fibrillation: Secondary | ICD-10-CM | POA: Diagnosis not present

## 2013-11-30 DIAGNOSIS — I498 Other specified cardiac arrhythmias: Secondary | ICD-10-CM | POA: Diagnosis not present

## 2013-11-30 DIAGNOSIS — R001 Bradycardia, unspecified: Secondary | ICD-10-CM

## 2013-12-01 LAB — MDC_IDC_ENUM_SESS_TYPE_INCLINIC
Battery Remaining Longevity: 10
Brady Statistic RV Percent Paced: 97 %
Implantable Pulse Generator Serial Number: 111608
Lead Channel Impedance Value: 485 Ohm
Lead Channel Pacing Threshold Amplitude: 0.7 V
Lead Channel Pacing Threshold Pulse Width: 0.4 ms
Lead Channel Setting Pacing Pulse Width: 0.4 ms
Lead Channel Setting Sensing Sensitivity: 2.5 mV
MDC IDC MSMT LEADCHNL RA SENSING INTR AMPL: 2.1 mV
MDC IDC MSMT LEADCHNL RV IMPEDANCE VALUE: 736 Ohm
MDC IDC SET LEADCHNL RA PACING AMPLITUDE: 2 V
MDC IDC STAT BRADY RA PERCENT PACED: 1 %

## 2013-12-01 NOTE — Progress Notes (Signed)
Pacemaker check in clinic. Normal device function. Thresholds, sensing, impedances consistent with previous measurements. Device programmed to maximize longevity. AF Burden 57%---(1)<5min, (21) 1 min-<1 hour, (42) 1 hr-<24hrs, (1)24-<48hrs, (2)>48 hours---AF/AFL+Eliquis 2.5BID. No high ventricular rates noted. Device programmed at appropriate safety margins. Histogram distribution appropriate for patient activity level. Device programmed to optimize intrinsic conduction. Estimated longevity 10 years. Patient will follow up with GT in 6 months.

## 2013-12-13 ENCOUNTER — Ambulatory Visit (INDEPENDENT_AMBULATORY_CARE_PROVIDER_SITE_OTHER): Payer: Medicare Other | Admitting: Cardiology

## 2013-12-13 ENCOUNTER — Encounter: Payer: Self-pay | Admitting: Cardiology

## 2013-12-13 VITALS — BP 110/70 | HR 70 | Ht 59.0 in | Wt 100.0 lb

## 2013-12-13 DIAGNOSIS — I4892 Unspecified atrial flutter: Secondary | ICD-10-CM

## 2013-12-13 NOTE — Progress Notes (Signed)
HPI  The patient presents for followup of mitral valve replacement. She had a bioprosthetic mitral valve and a Maze procedure in 05/2012.  She's also had pacemaker placement for bradycardia. She's had paroxysmal atrial fibrillation/flutter.   Since saw her she has had no acute complaints although she is more fatigued than she recalls. She was fatigued when I saw her in September. However, she thinks she might have gotten better. Interestingly she was in sinus rhythm when Dr. Lovena Le saw her in October. She think she's been more fatigued for a couple of months.  She has had some anemia and is being treated with iron. This is just started. She's not describing any presyncope or syncope. She doesn't have any palpitations, PND or orthopnea. She has had no weight gain or edema.   Allergies  Allergen Reactions  . Promethazine Hcl Other (See Comments)    unknown  . Sulfonamide Derivatives Other (See Comments)    unknown  . Tequin Other (See Comments)    hallucinations     Current Outpatient Prescriptions  Medication Sig Dispense Refill  . acetaminophen (TYLENOL) 500 MG tablet Take 1 tablet (500 mg total) by mouth every 6 (six) hours as needed.  30 tablet    . albuterol (PROVENTIL) (2.5 MG/3ML) 0.083% nebulizer solution Take 2.5 mg by nebulization every 6 (six) hours as needed.      Marland Kitchen apixaban (ELIQUIS) 2.5 MG TABS tablet Take 1 tablet (2.5 mg total) by mouth 2 (two) times daily.  60 tablet  2  . Calcium Carbonate-Vitamin D (CALTRATE 600+D) 600-400 MG-UNIT per tablet Take 1 tablet by mouth 2 (two) times daily.      . folic acid (FOLVITE) 1 MG tablet Take 2 mg by mouth daily.       . furosemide (LASIX) 20 MG tablet TAKE 1 TABLET BY MOUTH ON MONDAY, WEDNESDAY, AND FRIDAY ONLY  30 tablet  3  . levalbuterol (XOPENEX HFA) 45 MCG/ACT inhaler Inhale 2 puffs into the lungs every 6 (six) hours as needed. For wheezing      . methotrexate (RHEUMATREX) 2.5 MG tablet Take 10 mg by mouth once a week.  Caution:Chemotherapy. Protect from light.      . metoprolol succinate (TOPROL-XL) 25 MG 24 hr tablet TAKE 1 TABLET BY MOUTH TWICE A DAY  60 tablet  6  . montelukast (SINGULAIR) 10 MG tablet Take 10 mg by mouth at bedtime.       Marland Kitchen NASONEX 50 MCG/ACT nasal spray Place 2 sprays into the nose daily.       . nitroGLYCERIN (NITROSTAT) 0.4 MG SL tablet Place 1 tablet (0.4 mg total) under the tongue every 5 (five) minutes as needed for chest pain.      . predniSONE (DELTASONE) 2.5 MG tablet Take 2.5 mg by mouth every other day.       No current facility-administered medications for this visit.    Past Medical History  Diagnosis Date  . MVP (mitral valve prolapse)   . Pulmonary hypertension     MILD TO MODERATE BY ECHO  . History of chest pain   . Mitral regurgitation     MILD  . Asthma   . Rheumatoid arthritis(714.0)   . Shortness of breath   . H/O: hysterectomy   . LBBB (left bundle branch block)     per discharge note 2003  . PVC (premature ventricular contraction)   . Atrial fibrillation   . Hypertension   . Heart murmur   . S/P  mitral valve replacement with bioprosthetic valve 05/18/2012    34mm Edwards [facility] Mitral pericardial bioprosthesis   . S/P Maze operation for atrial fibrillation 05/18/2012    Complete biatrial lesion set using cryothermy with oversewing of LA appendage  . COPD (chronic obstructive pulmonary disease)   . CHF (congestive heart failure)   . Pacemaker     Past Surgical History  Procedure Laterality Date  . Cholecystectomy    . Ovarian cyst removal    . Hysterectomy    . Appendectomy    . Cataract extraction    . Tee without cardioversion  02/09/2012    Procedure: TRANSESOPHAGEAL ECHOCARDIOGRAM (TEE);  Surgeon: Fay Records, MD;  Location: [facility] ENDOSCOPY;  Service: Cardiovascular;  Laterality: N/A;  . Maze  05/18/2012    Procedure: MAZE;  Surgeon: Rexene Alberts, MD;  Location: [place];  Service: Open Heart Surgery;  Laterality: Right;  . Mitral valve  replacement  05/18/2012    Procedure: MINIMALLY INVASIVE MITRAL VALVE (MV) REPLACEMENT;  Surgeon: Rexene Alberts, MD;  Location: [place];  Service: Open Heart Surgery;  Laterality: N/A;  . Pacemaker insertion    . Abdominal hysterectomy     ROS:  As stated in the HPI and negative for all other systems.  GENERAL:  Well appearing BP 110/70  Pulse 70  Ht 4\' 11"  (1.499 m)  Wt 100 lb (45.36 kg)  BMI 20.19 kg/m2  HEENT:  Pupils equal round and reactive, fundi not visualized, oral mucosa unremarkable NECK:  No jugular venous distention, waveform within normal limits, carotid upstroke brisk and symmetric, no bruits, no thyromegaly LYMPHATICS:  No cervical, inguinal adenopathy LUNGS:  Clear to auscultation bilaterally BACK:  No CVA tenderness CHEST:  Unremarkable, well healed left thoracotomy scar HEART:  PMI not displaced or sustained,S1 and S2 within normal limits, no S3, no S4, no clicks, no rubs, no murmurs ABD:  Flat, positive bowel sounds normal in frequency in pitch, no bruits, no rebound, no guarding, no midline pulsatile mass, no hepatomegaly, no splenomegaly EXT:  2 plus pulses throughout, no edema, no cyanosis no clubbing SKIN:  No rashes no nodules NEURO:  Cranial nerves II through XII grossly intact, motor grossly intact throughout PSYCH:  Cognitively intact, oriented to person place and time  EKG:  Atypical atrial flutter with ventricular pacing.  12/13/2013  ASSESSMENT AND PLAN  MVR:  Her echocardiogram in November of last year looked fine. No change in therapy is indicated.  ATRIAL FIB/FLUTTER:  She has been in flutter since April. (We had her device interrogated.) I have no way of knowing whether this correlates to her fatigue. However, at this point she is not interested in attempted cardioversion or medical therapy for this rhythm. She does promise that if she starts to feel better with less fatigue in the future she will let me know at which point I will repeat EKG. If she is  in sinus at that point a correlation grows stronger.  FATIGUE:  As above.

## 2013-12-13 NOTE — Patient Instructions (Signed)
Your physician recommends that you continue on your current medications as directed. Please refer to the Current Medication list given to you today.  Your physician wants you to follow-up in: 6 months. You will receive a reminder letter in the mail two months in advance. If you don't receive a letter, please call our office to schedule the follow-up appointment.  

## 2013-12-19 ENCOUNTER — Encounter: Payer: Self-pay | Admitting: Internal Medicine

## 2013-12-20 DIAGNOSIS — D649 Anemia, unspecified: Secondary | ICD-10-CM | POA: Diagnosis not present

## 2013-12-20 DIAGNOSIS — I4891 Unspecified atrial fibrillation: Secondary | ICD-10-CM | POA: Diagnosis not present

## 2013-12-29 DIAGNOSIS — R300 Dysuria: Secondary | ICD-10-CM | POA: Diagnosis not present

## 2014-01-05 ENCOUNTER — Other Ambulatory Visit: Payer: Self-pay | Admitting: *Deleted

## 2014-01-05 MED ORDER — METOPROLOL SUCCINATE ER 25 MG PO TB24: ORAL_TABLET | ORAL | Status: DC

## 2014-02-09 ENCOUNTER — Other Ambulatory Visit: Payer: Self-pay | Admitting: *Deleted

## 2014-02-09 MED ORDER — APIXABAN 2.5 MG PO TABS: 2.5000 mg | ORAL_TABLET | Freq: Two times a day (BID) | ORAL | Status: DC

## 2014-02-22 DIAGNOSIS — M069 Rheumatoid arthritis, unspecified: Secondary | ICD-10-CM | POA: Diagnosis not present

## 2014-03-01 ENCOUNTER — Telehealth: Payer: Self-pay | Admitting: Cardiology

## 2014-03-07 NOTE — Telephone Encounter (Signed)
Closed encounter °

## 2014-04-25 DIAGNOSIS — Z23 Encounter for immunization: Secondary | ICD-10-CM | POA: Diagnosis not present

## 2014-05-16 NOTE — Telephone Encounter (Signed)
error 

## 2014-05-24 ENCOUNTER — Other Ambulatory Visit: Payer: Self-pay

## 2014-05-24 MED ORDER — FUROSEMIDE 20 MG PO TABS: ORAL_TABLET | ORAL | Status: DC

## 2014-05-25 DIAGNOSIS — M7070 Other bursitis of hip, unspecified hip: Secondary | ICD-10-CM | POA: Diagnosis not present

## 2014-05-25 DIAGNOSIS — M150 Primary generalized (osteo)arthritis: Secondary | ICD-10-CM | POA: Diagnosis not present

## 2014-05-25 DIAGNOSIS — M0589 Other rheumatoid arthritis with rheumatoid factor of multiple sites: Secondary | ICD-10-CM | POA: Diagnosis not present

## 2014-05-31 ENCOUNTER — Telehealth: Payer: Self-pay | Admitting: Physician Assistant

## 2014-05-31 ENCOUNTER — Other Ambulatory Visit: Payer: Self-pay | Admitting: Physician Assistant

## 2014-05-31 MED ORDER — NITROGLYCERIN 0.4 MG SL SUBL: 0.4000 mg | SUBLINGUAL_TABLET | SUBLINGUAL | Status: DC | PRN

## 2014-05-31 NOTE — Telephone Encounter (Signed)
     Patient needed refills on SL NTG. I ordered this for her. She said she had some very light transient chest pain that has now resolved, but she would like to have some SL NTG on hand. She will call us back or call EMS if chest pain returns.   Angelena Form PA-C  [facility]

## 2014-06-02 ENCOUNTER — Other Ambulatory Visit: Payer: Self-pay | Admitting: Cardiology

## 2014-06-13 ENCOUNTER — Encounter: Payer: Self-pay | Admitting: Cardiology

## 2014-06-13 ENCOUNTER — Ambulatory Visit (INDEPENDENT_AMBULATORY_CARE_PROVIDER_SITE_OTHER): Payer: Medicare Other | Admitting: Cardiology

## 2014-06-13 VITALS — BP 104/58 | HR 70 | Ht 60.0 in | Wt 101.3 lb

## 2014-06-13 DIAGNOSIS — I483 Typical atrial flutter: Secondary | ICD-10-CM | POA: Diagnosis not present

## 2014-06-13 DIAGNOSIS — I340 Nonrheumatic mitral (valve) insufficiency: Secondary | ICD-10-CM

## 2014-06-13 DIAGNOSIS — I059 Rheumatic mitral valve disease, unspecified: Secondary | ICD-10-CM

## 2014-06-13 NOTE — Patient Instructions (Signed)
Continue same medications.   Your physician wants you to follow-up in: 6 months.  You will receive a reminder letter in the mail two months in advance. If you don't receive a letter, please call our office to schedule the follow-up appointment.  

## 2014-06-13 NOTE — Progress Notes (Signed)
HPI  The patient presents for followup of mitral valve replacement. She had a bioprosthetic mitral valve and a Maze procedure in 05/2012.  She's also had pacemaker placement for bradycardia. She's had paroxysmal atrial fibrillation/flutter.  She called in late last month with some episodes of chest discomfort and was given a prescription for sublingual nitroglycerin. She said this has been rare and she hasn't had any since then. She has not needed to use a nitroglycerin. She cannot quantify or qualify this. She has no new complaints. She continues to have some leg weakness and fatigue. She has not however fallen. She rarely feels palpitations. She has had no presyncope or syncope. She has had no new shortness of breath. Of note I did review her previous catheterization prior to valve replacement and she had no obstructive coronary disease.  Allergies  Allergen Reactions  . Promethazine Hcl Other (See Comments)    unknown  . Sulfonamide Derivatives Other (See Comments)    unknown  . Tequin Other (See Comments)    hallucinations     Current Outpatient Prescriptions  Medication Sig Dispense Refill  . acetaminophen (TYLENOL) 500 MG tablet Take 1 tablet (500 mg total) by mouth every 6 (six) hours as needed. 30 tablet   . albuterol (PROVENTIL) (2.5 MG/3ML) 0.083% nebulizer solution Take 2.5 mg by nebulization every 6 (six) hours as needed.    . Calcium Carbonate-Vitamin D (CALTRATE 600+D) 600-400 MG-UNIT per tablet Take 1 tablet by mouth 2 (two) times daily.    Marland Kitchen ELIQUIS 2.5 MG TABS tablet TAKE 1 TABLET BY MOUTH TWICE A DAY 60 tablet 3  . folic acid (FOLVITE) 1 MG tablet Take 2 mg by mouth daily.     . furosemide (LASIX) 20 MG tablet TAKE 1 TABLET BY MOUTH ON MONDAY, WEDNESDAY, AND FRIDAY ONLY 30 tablet 3  . methotrexate (RHEUMATREX) 2.5 MG tablet Take 10 mg by mouth once a week. Caution:Chemotherapy. Protect from light.    . metoprolol succinate (TOPROL-XL) 25 MG 24 hr tablet TAKE 1 TABLET BY  MOUTH TWICE A DAY 60 tablet 3  . montelukast (SINGULAIR) 10 MG tablet Take 10 mg by mouth at bedtime.     . nitroGLYCERIN (NITROSTAT) 0.4 MG SL tablet Place 1 tablet (0.4 mg total) under the tongue every 5 (five) minutes as needed for chest pain. 90 tablet 3  . predniSONE (DELTASONE) 2.5 MG tablet Take 2.5 mg by mouth every other day.     No current facility-administered medications for this visit.    Past Medical History  Diagnosis Date  . MVP (mitral valve prolapse)   . Pulmonary hypertension     MILD TO MODERATE BY ECHO  . History of chest pain   . Mitral regurgitation     MILD  . Asthma   . Rheumatoid arthritis(714.0)   . Shortness of breath   . H/O: hysterectomy   . LBBB (left bundle branch block)     per discharge note 2003  . PVC (premature ventricular contraction)   . Atrial fibrillation   . Hypertension   . Heart murmur   . S/P mitral valve replacement with bioprosthetic valve 05/18/2012    63mm Edwards Magna Mitral pericardial bioprosthesis   . S/P Maze operation for atrial fibrillation 05/18/2012    Complete biatrial lesion set using cryothermy with oversewing of LA appendage  . COPD (chronic obstructive pulmonary disease)   . CHF (congestive heart failure)   . Pacemaker     Past Surgical History  Procedure Laterality Date  . Cholecystectomy    . Ovarian cyst removal    . Hysterectomy    . Appendectomy    . Cataract extraction    . Tee without cardioversion  02/09/2012    Procedure: TRANSESOPHAGEAL ECHOCARDIOGRAM (TEE);  Surgeon: Fay Records, MD;  Location: [facility] ENDOSCOPY;  Service: Cardiovascular;  Laterality: N/A;  . Maze  05/18/2012    Procedure: MAZE;  Surgeon: Rexene Alberts, MD;  Location: [place];  Service: Open Heart Surgery;  Laterality: Right;  . Mitral valve replacement  05/18/2012    Procedure: MINIMALLY INVASIVE MITRAL VALVE (MV) REPLACEMENT;  Surgeon: Rexene Alberts, MD;  Location: [place];  Service: Open Heart Surgery;  Laterality: N/A;  .  Pacemaker insertion    . Abdominal hysterectomy     ROS:  HOH.  Otherwise as stated in the HPI and negative for all other systems.  PHYSICAL EXAM BP 104/58 mmHg  Pulse 70  Ht 5' (1.524 m)  Wt 101 lb 4.8 oz (45.949 kg)  BMI 19.78 kg/m2  GENERAL:  Well appearing but looks her stated age. NECK:  No jugular venous distention, waveform within normal limits, carotid upstroke brisk and symmetric, no bruits, no thyromegaly LUNGS:  Clear to auscultation bilaterally BACK:  No CVA tenderness CHEST:  Unremarkable, well healed left thoracotomy scar HEART:  PMI not displaced or sustained,S1 and S2 within normal limits, no S3, no S4, no clicks, no rubs, no murmurs ABD:  Flat, positive bowel sounds normal in frequency in pitch, no bruits, no rebound, no guarding, no midline pulsatile mass, no hepatomegaly, no splenomegaly EXT:  2 plus pulses throughout, no edema, no cyanosis no clubbing   EKG:  Atypical atrial flutter with ventricular pacing rate 70.  06/13/2014  ASSESSMENT AND PLAN  CHEST PAIN:  She has some rare discomfort. I don't expect if she has any new obstructive coronary disease. She wants conservative therapy. No change in therapy or further evaluation is warranted.  MVR:  Her echocardiogram in November of last year looked fine. No change in therapy is indicated.  ATRIAL FIB/FLUTTER:  She has been in flutter since April.  I cannot correlate this to any symptoms. She has not wanted cardioversion. She's already on anticoagulation. No change in therapy is indicated.

## 2014-06-21 DIAGNOSIS — Z23 Encounter for immunization: Secondary | ICD-10-CM | POA: Diagnosis not present

## 2014-06-21 DIAGNOSIS — J45909 Unspecified asthma, uncomplicated: Secondary | ICD-10-CM | POA: Diagnosis not present

## 2014-06-23 IMAGING — CR DG CHEST 2V
2 series · 2 of 2 positions shown · non-contrast
Comparison: Prior CT chest 08/25/2010

CLINICAL DATA: Preop mitral valve repair

CHEST - 2 VIEW

[view not recorded (1 of 2)]
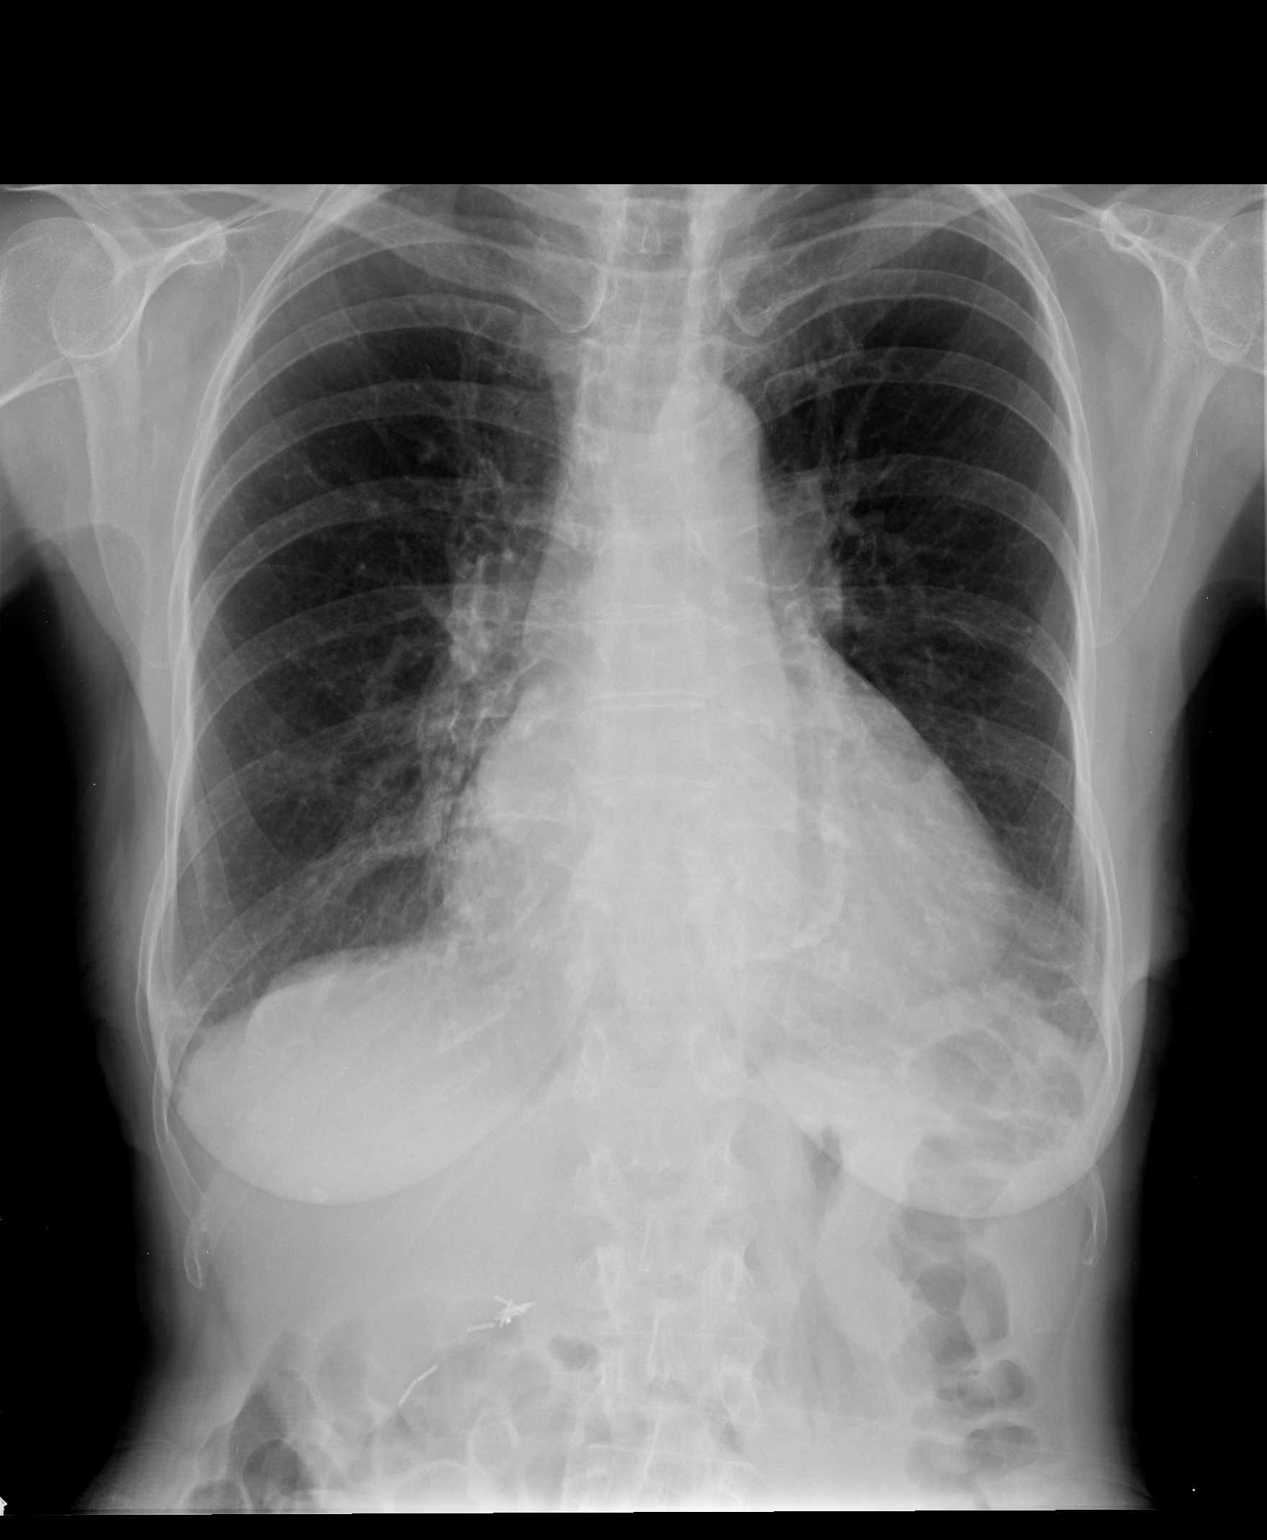

[view not recorded (2 of 2)]
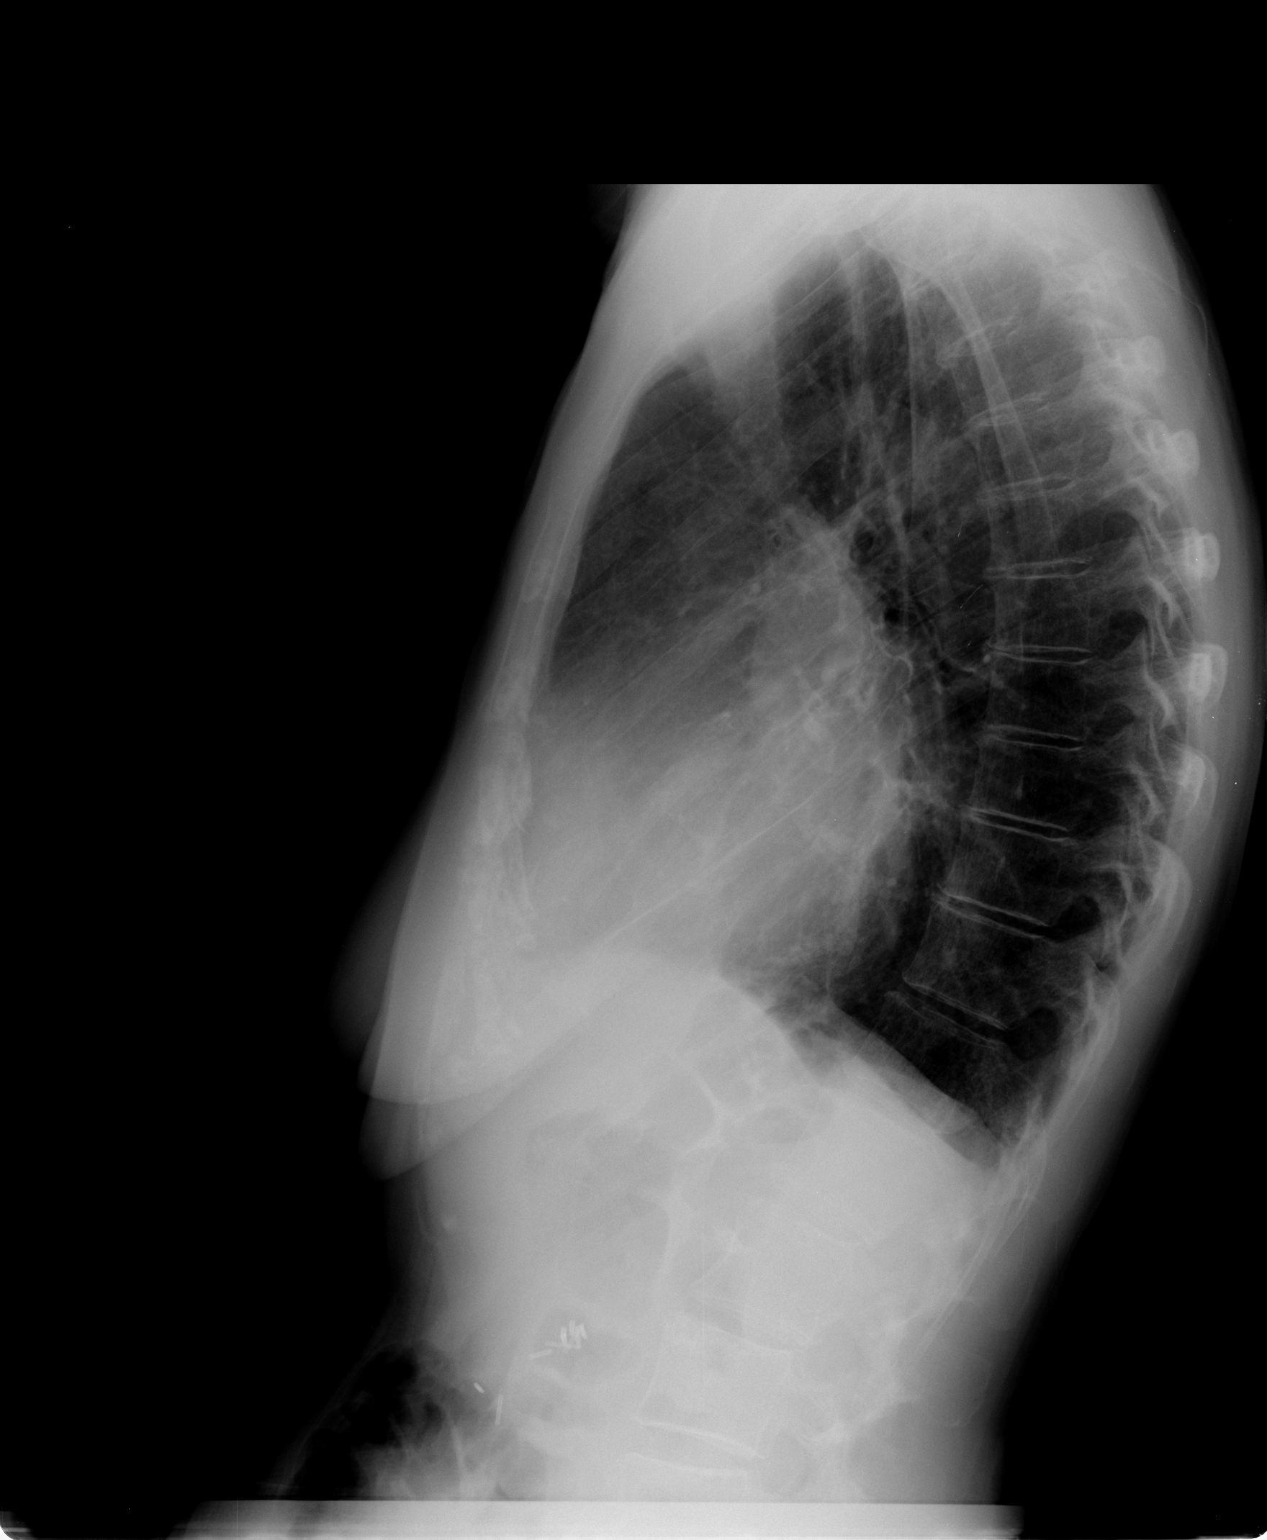

[2 of 2 positions shown; findings below may reference images not displayed]

FINDINGS: Negative for pulmonary edema, focal airspace
consolidation, pulmonary nodule.  Mild cardiomegaly with left
atrial enlargement.  Chronic hyperinflation and coarsening of the
initial markings combined with bronchitic changes suggest
underlying the right upper quadrant from prior cholecystectomy.
Osseous abnormality.
IMPRESSION: 1.  No acute cardiopulmonary disease
2.  Stable cardiomegaly with left atrial enlargement
3.  Calcification of the mitral valve annulus
4.  Background changes of COPD.

## 2014-06-30 IMAGING — CR DG CHEST 1V PORT
1 series · 1 of 1 positions shown · non-contrast
Comparison: 05/20/2012; 05/19/2012; 05/18/2012

CLINICAL DATA: Chest tube

PORTABLE CHEST - 1 VIEW

[AP]
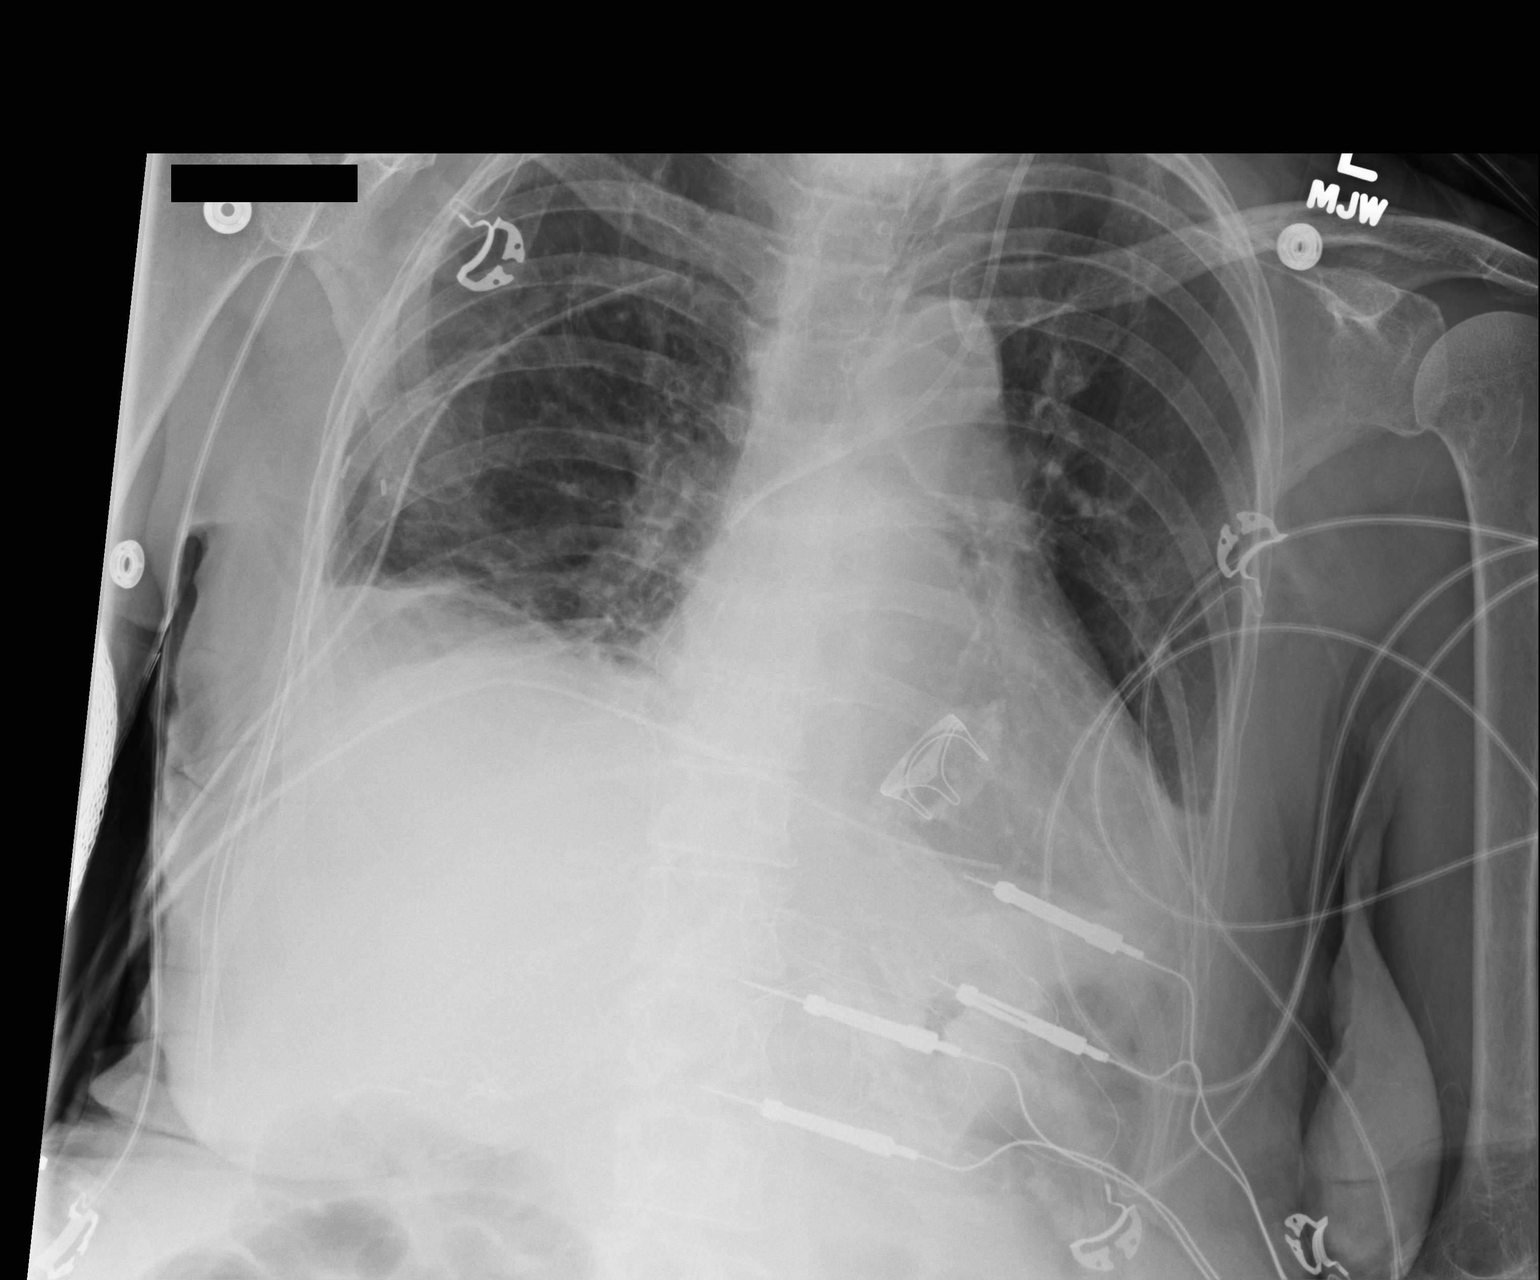

[1 of 1 positions shown; findings below may reference images not displayed]

FINDINGS: Grossly unchanged cardiac silhouette and mediastinal contours post
aortic valve repair.  Interval removal of a left jugular approach
central venous catheter.  A remaining left jugular approach central
venous catheter tip overlies the superior cavoatrial junction.
Stable positioning of two right-sided chest tubes.  No definite
pneumothorax.  Unchanged small bilateral effusions and bibasilar
opacities, right greater than left. No new focal airspace
opacities.  No definite evidence of edema.  Unchanged bones.  Post
cholecystectomy.
IMPRESSION: 1.  Interval removal of a left jugular approach central venous
catheter.  Otherwise, stable position of support apparatus.  No
pneumothorax.
2.  Unchanged small bilateral effusions and bibasilar opacities,
right greater than left, possibly atelectasis

## 2014-07-01 IMAGING — CR DG CHEST 1V PORT
1 series · 1 of 1 positions shown · non-contrast
Comparison: 05/21/2012; 05/20/2012

CLINICAL DATA: Post pacemaker insertion

PORTABLE CHEST - 1 VIEW

[AP]
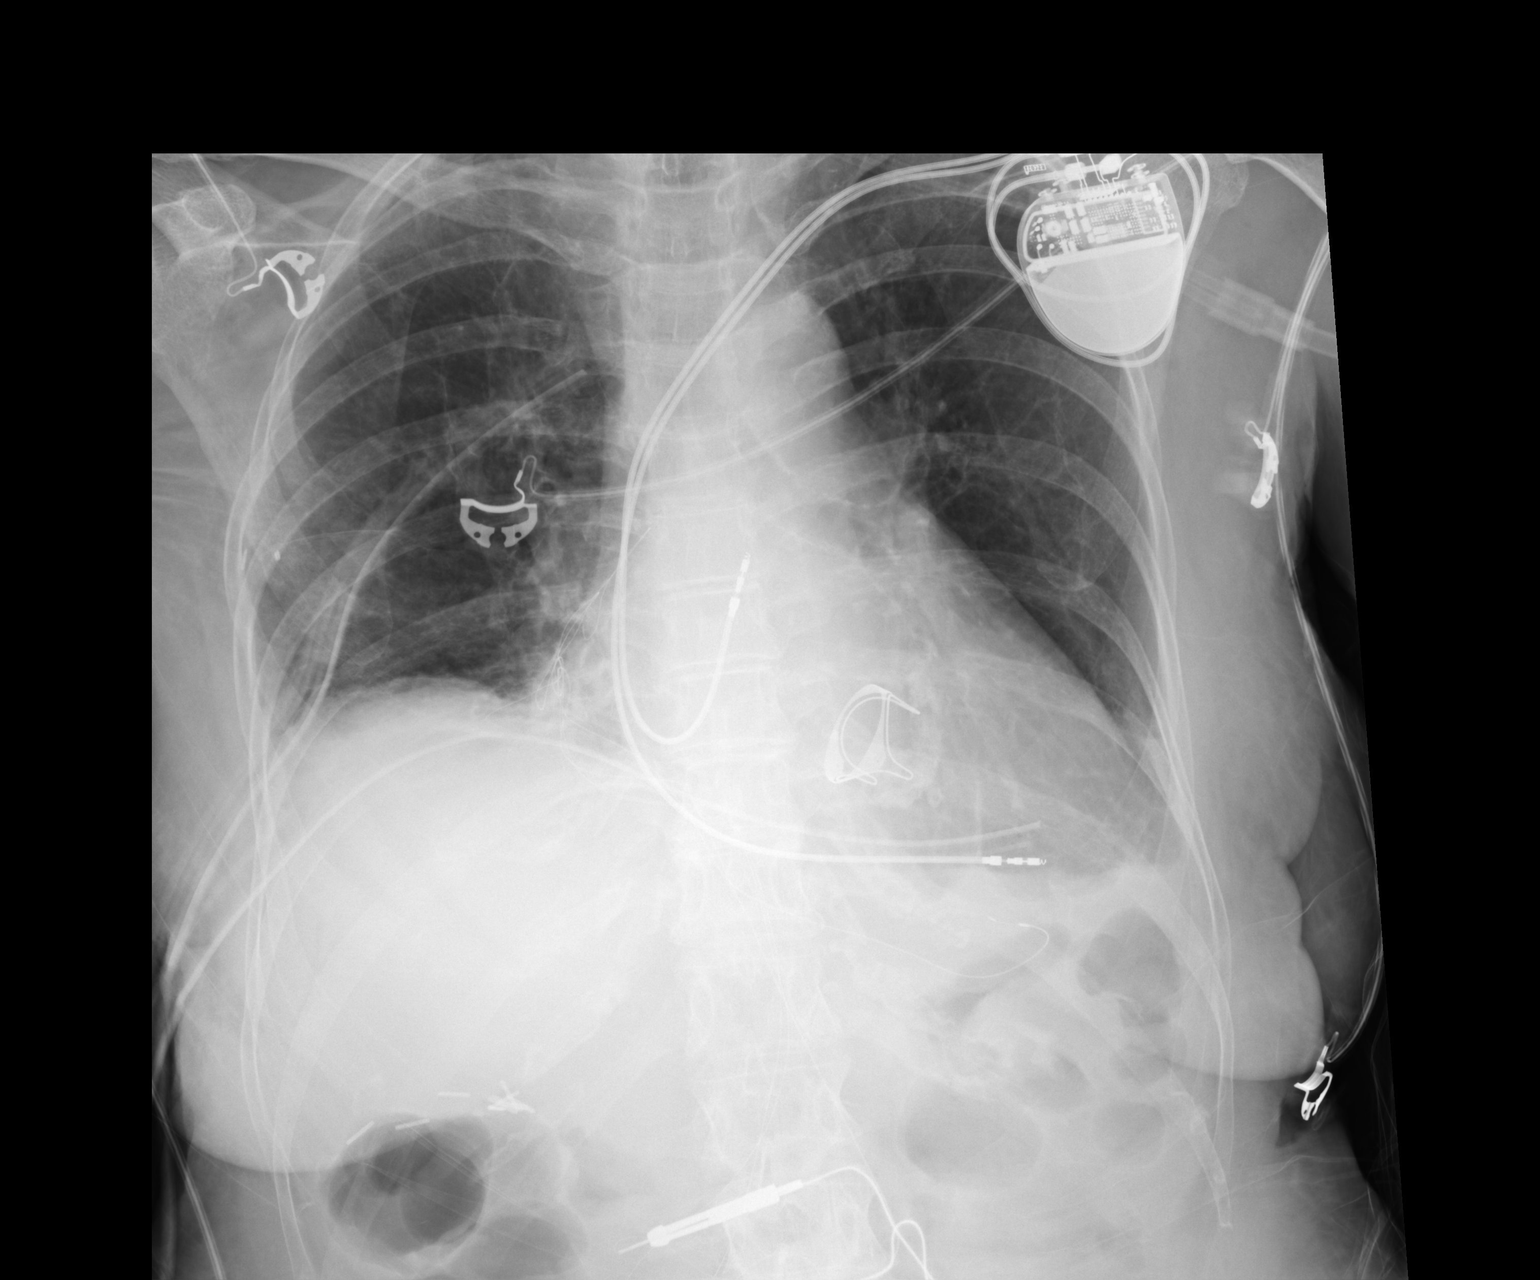

[1 of 1 positions shown; findings below may reference images not displayed]

FINDINGS: Grossly unchanged cardiac silhouette and mediastinal contours post
mitral valve repair.  Interval removal of left jugular approach
central venous catheter.  Interval placement of left anterior chest
wall dual lead pacemaker with tips overlying expected location of
the right atrium and ventricle.  Two right-sided chest tubes are
unchanged in positioning.  Note is made of a tiny right apical
pneumothorax.  There is persistent mild elevation of the right
hemidiaphragm.  Lung volumes appear hyperexpanded.  Minimal
improved aeration of the bilateral lung bases with persistent
bibasilar opacities favored to represent atelectasis versus scar.
Unchanged bones.  Post cholecystectomy.
IMPRESSION: 1.  Interval placement of left anterior chest wall dual lead
pacemaker with tips overlying the expected location of the right
atrium and ventricle.
2.  Stable positioning of two right-sided chest tubes, now with
tiny right apical pneumothorax.
3.  Overall improved aeration of the lungs with persistent
bibasilar opacities favored to represent atelectasis.

## 2014-07-06 IMAGING — CR DG CHEST 2V
2 series · 2 of 2 positions shown · non-contrast
Comparison: PA and lateral chest 05/24/2012.

CLINICAL DATA: Weakness.  Pleural effusion.

CHEST - 2 VIEW

[w chest pa]
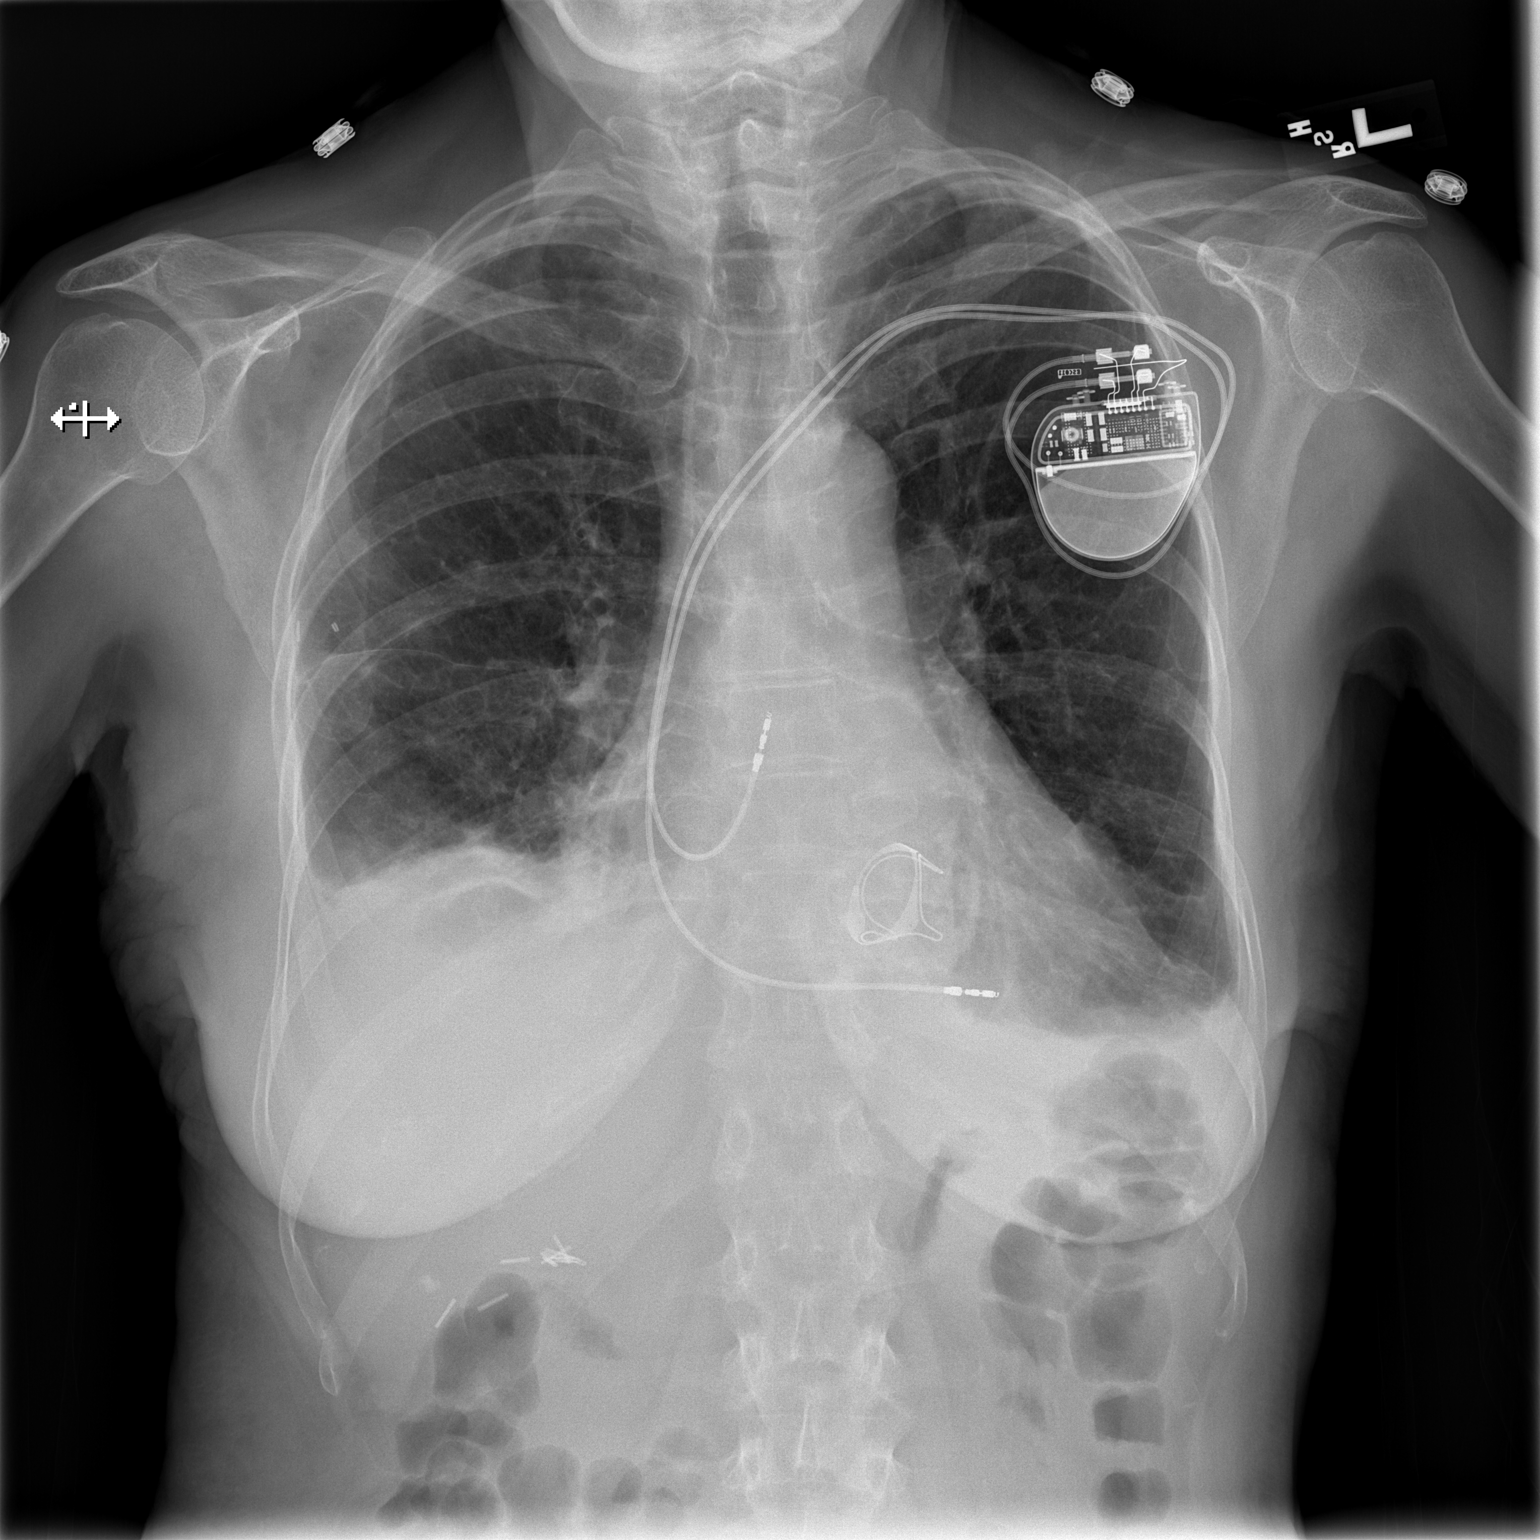

[w chest lat]
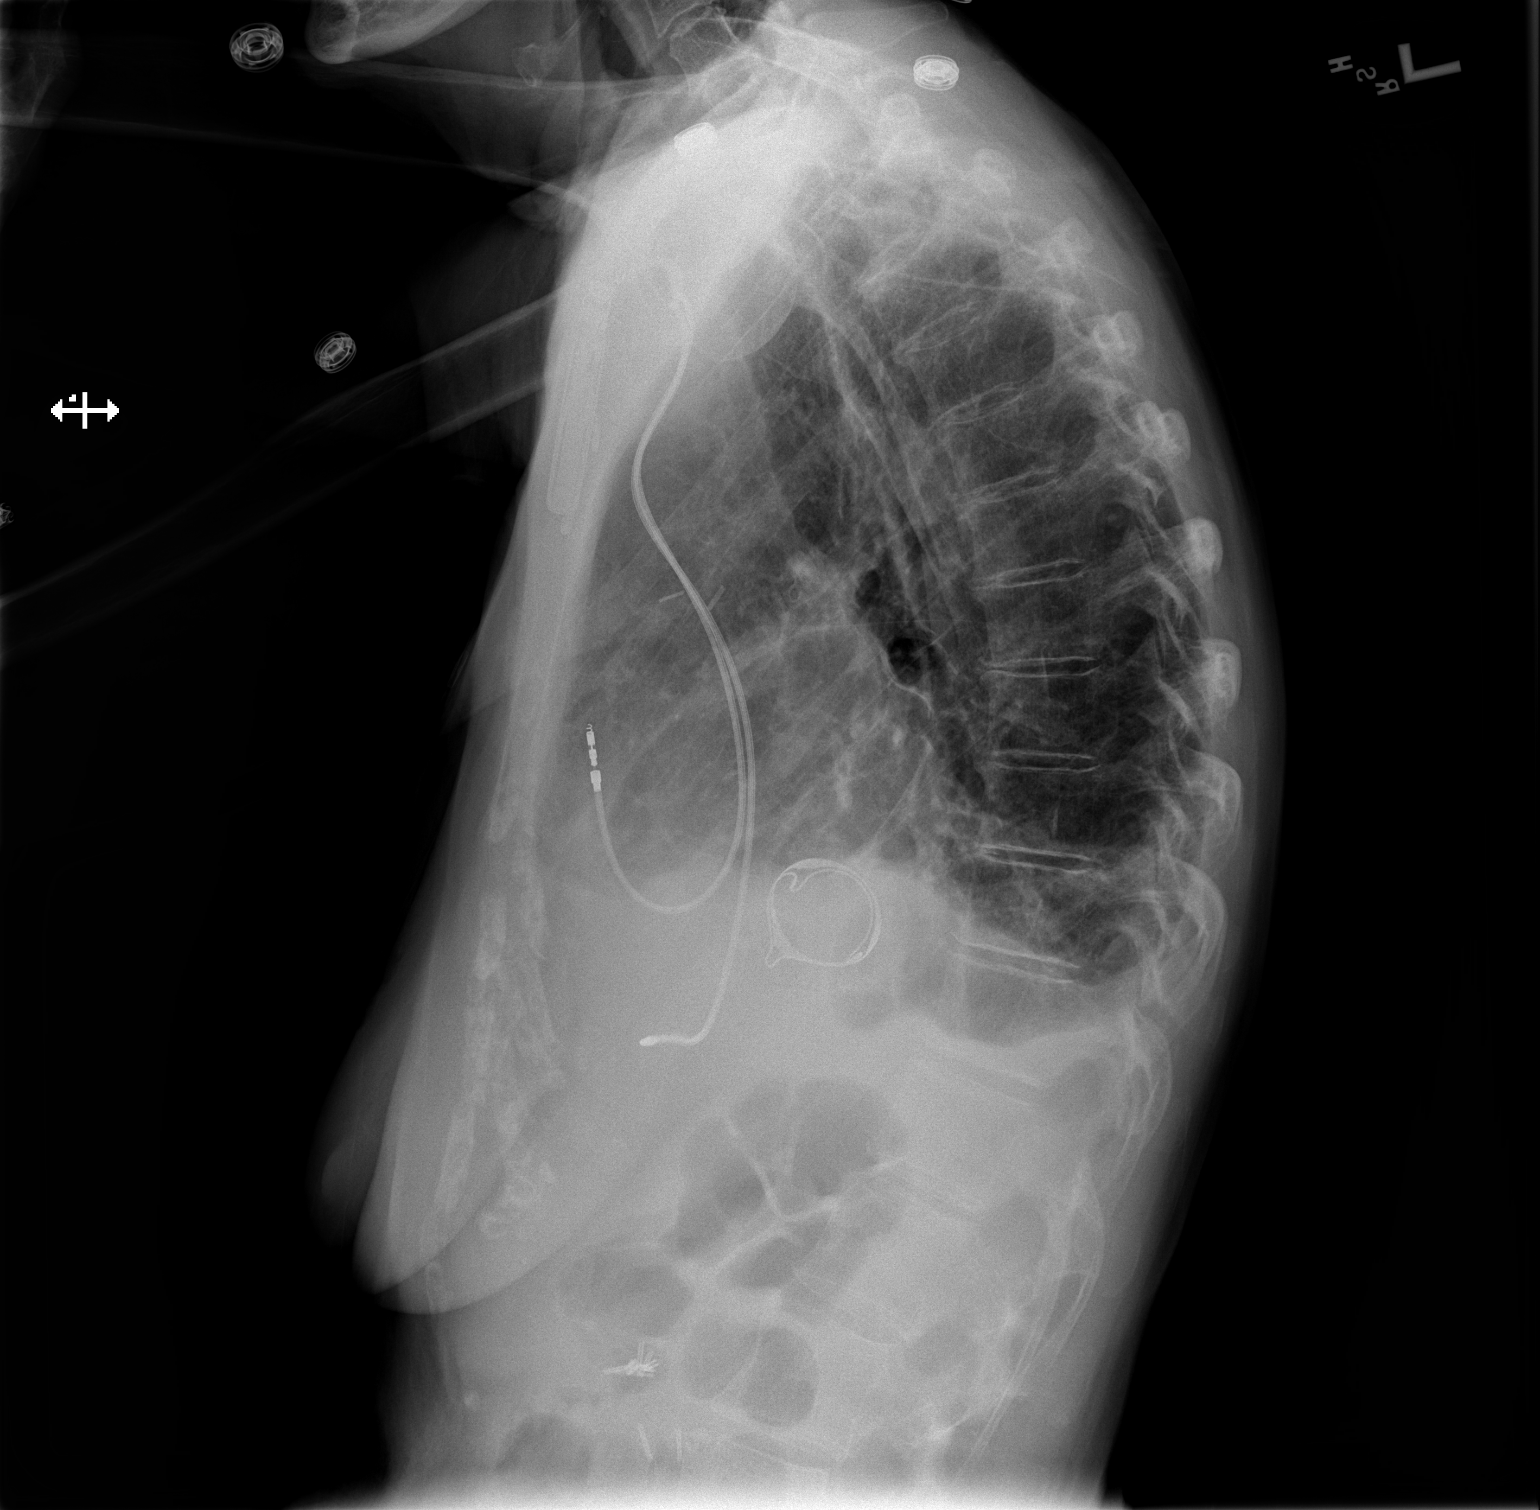

[2 of 2 positions shown; findings below may reference images not displayed]

FINDINGS: Small bilateral pleural effusions and basilar atelectasis
persist.  The patient's right pleural effusion appears decreased in
size. Small right apical pneumothorax, less than 5%, has decreased
in size.  Associated basilar atelectasis is noted.  There is
cardiomegaly without edema.
IMPRESSION: 1.  Some decrease in a small right pleural effusion.  Tiny right
apical pneumothorax is also decreased in size.
2.  No change in a very small left pleural effusion.

## 2014-07-13 ENCOUNTER — Encounter (HOSPITAL_COMMUNITY): Payer: Self-pay | Admitting: Internal Medicine

## 2014-08-30 DIAGNOSIS — M0589 Other rheumatoid arthritis with rheumatoid factor of multiple sites: Secondary | ICD-10-CM | POA: Diagnosis not present

## 2014-09-06 DIAGNOSIS — J45909 Unspecified asthma, uncomplicated: Secondary | ICD-10-CM | POA: Diagnosis not present

## 2014-09-06 DIAGNOSIS — F419 Anxiety disorder, unspecified: Secondary | ICD-10-CM | POA: Diagnosis not present

## 2014-09-12 DIAGNOSIS — L821 Other seborrheic keratosis: Secondary | ICD-10-CM | POA: Diagnosis not present

## 2014-09-12 DIAGNOSIS — L570 Actinic keratosis: Secondary | ICD-10-CM | POA: Diagnosis not present

## 2014-09-12 DIAGNOSIS — L720 Epidermal cyst: Secondary | ICD-10-CM | POA: Diagnosis not present

## 2014-10-06 ENCOUNTER — Telehealth: Payer: Self-pay | Admitting: Cardiology

## 2014-10-07 ENCOUNTER — Other Ambulatory Visit: Payer: Self-pay | Admitting: Cardiology

## 2014-10-09 NOTE — Telephone Encounter (Signed)
Close encounter 

## 2014-10-12 DIAGNOSIS — H903 Sensorineural hearing loss, bilateral: Secondary | ICD-10-CM | POA: Diagnosis not present

## 2014-10-24 DIAGNOSIS — M545 Low back pain: Secondary | ICD-10-CM | POA: Diagnosis not present

## 2014-10-24 DIAGNOSIS — M0589 Other rheumatoid arthritis with rheumatoid factor of multiple sites: Secondary | ICD-10-CM | POA: Diagnosis not present

## 2014-10-24 DIAGNOSIS — M150 Primary generalized (osteo)arthritis: Secondary | ICD-10-CM | POA: Diagnosis not present

## 2014-11-23 DIAGNOSIS — H903 Sensorineural hearing loss, bilateral: Secondary | ICD-10-CM | POA: Diagnosis not present

## 2014-12-19 ENCOUNTER — Encounter: Payer: Self-pay | Admitting: Cardiology

## 2014-12-19 ENCOUNTER — Ambulatory Visit (INDEPENDENT_AMBULATORY_CARE_PROVIDER_SITE_OTHER): Payer: Medicare Other | Admitting: Cardiology

## 2014-12-19 VITALS — BP 104/58 | HR 70 | Ht 60.0 in | Wt 107.0 lb

## 2014-12-19 DIAGNOSIS — E878 Other disorders of electrolyte and fluid balance, not elsewhere classified: Secondary | ICD-10-CM | POA: Diagnosis not present

## 2014-12-19 DIAGNOSIS — R531 Weakness: Secondary | ICD-10-CM | POA: Diagnosis not present

## 2014-12-19 NOTE — Patient Instructions (Signed)
Your physician recommends that you schedule a follow-up appointment in: one year with Dr. Percival Spanish  We have ordered labs for you to get done

## 2014-12-19 NOTE — Progress Notes (Signed)
HPI  The patient presents for followup of mitral valve replacement.  She had a bioprosthetic mitral valve and a Maze procedure in 05/2012.  She's also had pacemaker placement for bradycardia. She's had atrial fibrillation/flutter.  She has no new complaints.  She has had no presyncope or syncope. She has had no new shortness of breath.  She lives by herself and walks a short distance to her mailbox. She might get tired or short of breath with this but she's had no overt symptoms. She's not describing PND or orthopnea. She's not having any palpitations, presyncope or syncope. She's tolerating her anticoagulation.  Allergies  Allergen Reactions  . Promethazine Hcl Other (See Comments)    unknown  . Sulfonamide Derivatives Other (See Comments)    unknown  . Tequin Other (See Comments)    hallucinations     Current Outpatient Prescriptions  Medication Sig Dispense Refill  . acetaminophen (TYLENOL) 500 MG tablet Take 1 tablet (500 mg total) by mouth every 6 (six) hours as needed. 30 tablet   . albuterol (PROVENTIL) (2.5 MG/3ML) 0.083% nebulizer solution Take 2.5 mg by nebulization every 6 (six) hours as needed.    . Calcium Carbonate-Vitamin D (CALTRATE 600+D) 600-400 MG-UNIT per tablet Take 1 tablet by mouth 2 (two) times daily.    Marland Kitchen ELIQUIS 2.5 MG TABS tablet TAKE 1 TABLET BY MOUTH TWICE A DAY 60 tablet 3  . folic acid (FOLVITE) 1 MG tablet Take 2 mg by mouth daily.     . furosemide (LASIX) 20 MG tablet TAKE 1 TABLET BY MOUTH ON MONDAY, WEDNESDAY, AND FRIDAY ONLY 30 tablet 3  . methotrexate (RHEUMATREX) 2.5 MG tablet Take 10 mg by mouth once a week. Caution:Chemotherapy. Protect from light.    . metoprolol succinate (TOPROL-XL) 25 MG 24 hr tablet TAKE 1 TABLET BY MOUTH TWICE A DAY 60 tablet 3  . montelukast (SINGULAIR) 10 MG tablet Take 10 mg by mouth at bedtime.     . nitroGLYCERIN (NITROSTAT) 0.4 MG SL tablet Place 1 tablet (0.4 mg total) under the tongue every 5 (five) minutes as needed  for chest pain. 90 tablet 3  . predniSONE (DELTASONE) 2.5 MG tablet Take 2.5 mg by mouth every other day.     No current facility-administered medications for this visit.    Past Medical History  Diagnosis Date  . MVP (mitral valve prolapse)   . Pulmonary hypertension     MILD TO MODERATE BY ECHO  . History of chest pain   . Mitral regurgitation     MILD  . Asthma   . Rheumatoid arthritis(714.0)   . Shortness of breath   . H/O: hysterectomy   . LBBB (left bundle branch block)     per discharge note 2003  . PVC (premature ventricular contraction)   . Atrial fibrillation   . Hypertension   . Heart murmur   . S/P mitral valve replacement with bioprosthetic valve 05/18/2012    26mm Edwards Magna Mitral pericardial bioprosthesis   . S/P Maze operation for atrial fibrillation 05/18/2012    Complete biatrial lesion set using cryothermy with oversewing of LA appendage  . COPD (chronic obstructive pulmonary disease)   . CHF (congestive heart failure)   . Pacemaker     Past Surgical History  Procedure Laterality Date  . Cholecystectomy    . Ovarian cyst removal    . Hysterectomy    . Appendectomy    . Cataract extraction    . Tee without cardioversion  02/09/2012    Procedure: TRANSESOPHAGEAL ECHOCARDIOGRAM (TEE);  Surgeon: Fay Records, MD;  Location: [facility] ENDOSCOPY;  Service: Cardiovascular;  Laterality: N/A;  . Maze  05/18/2012    Procedure: MAZE;  Surgeon: Rexene Alberts, MD;  Location: [place];  Service: Open Heart Surgery;  Laterality: Right;  . Mitral valve replacement  05/18/2012    Procedure: MINIMALLY INVASIVE MITRAL VALVE (MV) REPLACEMENT;  Surgeon: Rexene Alberts, MD;  Location: [place];  Service: Open Heart Surgery;  Laterality: N/A;  . Pacemaker insertion    . Abdominal hysterectomy    . Permanent pacemaker insertion N/A 05/21/2012    Procedure: PERMANENT PACEMAKER INSERTION;  Surgeon: Evans Lance, MD;  Location: [facility] CATH LAB;  Service: Cardiovascular;  Laterality:  N/A;   ROS:  HOH.  Otherwise as stated in the HPI and negative for all other systems.  PHYSICAL EXAM BP 104/58 mmHg  Pulse 70  Ht 5' (1.524 m)  Wt 107 lb (48.535 kg)  BMI 20.90 kg/m2  GENERAL:  Well appearing but looks her stated age. NECK:  No jugular venous distention, waveform within normal limits, carotid upstroke brisk and symmetric, no bruits, no thyromegaly LUNGS:  Clear to auscultation bilaterally BACK:  No CVA tenderness CHEST:  Unremarkable, well healed left thoracotomy scar HEART:  PMI not displaced or sustained,S1 and S2 within normal limits, no S3, no S4, no clicks, no rubs, soft apical rate systolic murmur nonradiating, no diastolic murmurs ABD:  Flat, positive bowel sounds normal in frequency in pitch, no bruits, no rebound, no guarding, no midline pulsatile mass, no hepatomegaly, no splenomegaly EXT:  2 plus pulses throughout, no edema, no cyanosis no clubbing   EKG:  Atypical atrial flutter with ventricular pacing rate 70.  12/19/2014  ASSESSMENT AND PLAN  CHEST PAIN:  She is no longer describing this. No change in therapy is indicated. No further imaging is indicated.  MVR:  Her echocardiogram in November of last year looked fine. No change in therapy is indicated.  ATRIAL FIB/FLUTTER:  She is in atrial flutter. She doesn't notice this. She tolerates anticoagulation.  No change in therapy is indicated. Ms. YUKTHA KERCHNER has a CHA2DS2 - VASc score of 4 with a risk of stroke of 4% .   We checked with her primary provider and I don't see any recent blood work. She will get a CBC and a basic metabolic profile.  PACEMAKER:  She is overdue for pacemaker follow-up. Checked on this. We will have her schedule a follow-up with Dr. Lovena Le.

## 2014-12-20 ENCOUNTER — Encounter: Payer: Self-pay | Admitting: Cardiology

## 2014-12-20 LAB — CBC
HCT: 35.6 % — ABNORMAL LOW (ref 36.0–46.0)
Hemoglobin: 11.9 g/dL — ABNORMAL LOW (ref 12.0–15.0)
MCH: 32.9 pg (ref 26.0–34.0)
MCHC: 33.4 g/dL (ref 30.0–36.0)
MCV: 98.3 fL (ref 78.0–100.0)
MPV: 9.9 fL (ref 8.6–12.4)
Platelets: 158 10*3/uL (ref 150–400)
RBC: 3.62 MIL/uL — ABNORMAL LOW (ref 3.87–5.11)
RDW: 15.7 % — AB (ref 11.5–15.5)
WBC: 6.5 10*3/uL (ref 4.0–10.5)

## 2014-12-20 LAB — BASIC METABOLIC PANEL
BUN: 11 mg/dL (ref 6–23)
CO2: 28 mEq/L (ref 19–32)
Calcium: 9 mg/dL (ref 8.4–10.5)
Chloride: 103 mEq/L (ref 96–112)
Creat: 0.82 mg/dL (ref 0.50–1.10)
Glucose, Bld: 97 mg/dL (ref 70–99)
POTASSIUM: 4.3 meq/L (ref 3.5–5.3)
SODIUM: 141 meq/L (ref 135–145)

## 2014-12-27 DIAGNOSIS — H2511 Age-related nuclear cataract, right eye: Secondary | ICD-10-CM | POA: Diagnosis not present

## 2014-12-27 DIAGNOSIS — H3531 Nonexudative age-related macular degeneration: Secondary | ICD-10-CM | POA: Diagnosis not present

## 2014-12-27 DIAGNOSIS — D3132 Benign neoplasm of left choroid: Secondary | ICD-10-CM | POA: Diagnosis not present

## 2014-12-27 DIAGNOSIS — H35372 Puckering of macula, left eye: Secondary | ICD-10-CM | POA: Diagnosis not present

## 2015-01-05 ENCOUNTER — Other Ambulatory Visit: Payer: Self-pay | Admitting: *Deleted

## 2015-01-05 MED ORDER — METOPROLOL SUCCINATE ER 25 MG PO TB24: 25.0000 mg | ORAL_TABLET | Freq: Two times a day (BID) | ORAL | Status: DC

## 2015-01-09 ENCOUNTER — Encounter: Payer: Self-pay | Admitting: *Deleted

## 2015-01-09 ENCOUNTER — Telehealth: Payer: Self-pay | Admitting: *Deleted

## 2015-01-09 NOTE — Telephone Encounter (Signed)
called for fm status, not able to reach pt.Marland KitchenMarland Kitchen

## 2015-01-11 ENCOUNTER — Encounter: Payer: Self-pay | Admitting: Internal Medicine

## 2015-01-11 ENCOUNTER — Ambulatory Visit (INDEPENDENT_AMBULATORY_CARE_PROVIDER_SITE_OTHER): Payer: Medicare Other | Admitting: Internal Medicine

## 2015-01-11 VITALS — BP 86/50 | HR 70 | Ht 59.0 in | Wt 107.3 lb

## 2015-01-11 DIAGNOSIS — I482 Chronic atrial fibrillation, unspecified: Secondary | ICD-10-CM

## 2015-01-11 DIAGNOSIS — R001 Bradycardia, unspecified: Secondary | ICD-10-CM | POA: Diagnosis not present

## 2015-01-11 DIAGNOSIS — Z950 Presence of cardiac pacemaker: Secondary | ICD-10-CM | POA: Diagnosis not present

## 2015-01-11 DIAGNOSIS — I340 Nonrheumatic mitral (valve) insufficiency: Secondary | ICD-10-CM

## 2015-01-11 LAB — CUP PACEART INCLINIC DEVICE CHECK
Brady Statistic RA Percent Paced: 1 % — CL
Brady Statistic RV Percent Paced: 99 %
Date Time Interrogation Session: 20160609040000
Lead Channel Impedance Value: 758 Ohm
Lead Channel Pacing Threshold Amplitude: 0.7 V
Lead Channel Pacing Threshold Pulse Width: 0.4 ms
Lead Channel Setting Pacing Amplitude: 2 V
Lead Channel Setting Pacing Pulse Width: 0.4 ms
MDC IDC MSMT LEADCHNL RA IMPEDANCE VALUE: 506 Ohm
MDC IDC MSMT LEADCHNL RA SENSING INTR AMPL: 2.2 mV
MDC IDC PG SERIAL: 111608
MDC IDC SET LEADCHNL RV PACING AMPLITUDE: 1.2 V
MDC IDC SET LEADCHNL RV SENSING SENSITIVITY: 2.5 mV
Zone Setting Detection Interval: 375 ms

## 2015-01-11 NOTE — Assessment & Plan Note (Signed)
She is s/p replacement and is stable.

## 2015-01-11 NOTE — Assessment & Plan Note (Signed)
Her ventricular rate is well controlled. She will continue her systemic anti-coagulation.

## 2015-01-11 NOTE — Assessment & Plan Note (Signed)
Interogation of a DDD PM demonstrates normal function. Will follow.

## 2015-01-11 NOTE — Patient Instructions (Signed)
Medication Instructions:  Your physician recommends that you continue on your current medications as directed. Please refer to the Current Medication list given to you today.   Labwork: None ordered  Testing/Procedures: None ordered  Follow-Up: Your physician wants you to follow-up in: 12 months with Dr Taylor You will receive a reminder letter in the mail two months in advance. If you don't receive a letter, please call our office to schedule the follow-up appointment.  Remote monitoring is used to monitor your Pacemaker of ICD from home. This monitoring reduces the number of office visits required to check your device to one time per year. It allows us to keep an eye on the functioning of your device to ensure it is working properly. You are scheduled for a device check from home on 04/12/15. You may send your transmission at any time that day. If you have a wireless device, the transmission will be sent automatically. After your physician reviews your transmission, you will receive a postcard with your next transmission date.    Any Other Special Instructions Will Be Listed Below (If Applicable).   

## 2015-01-11 NOTE — Progress Notes (Signed)
HPI Nancy Bush is here today for followup. She has a history of chronic diastolic heart failure, and mitral regurgitation status post mitral valve replacement, atrial arrhythmias status post maze procedure.  She is s/p PPM insertion. She has gradually begun to eat better and ambulate more. She denies chest pain or shortness of breath. She does have fatigue and weakness but this is better. No syncope Allergies  Allergen Reactions  . Promethazine Hcl Other (See Comments)    unknown  . Sulfonamide Derivatives Other (See Comments)    unknown  . Tequin Other (See Comments)    hallucinations      Current Outpatient Prescriptions  Medication Sig Dispense Refill  . acetaminophen (TYLENOL) 500 MG tablet Take 500 mg by mouth every 6 (six) hours as needed for mild pain.    Marland Kitchen albuterol (PROVENTIL) (2.5 MG/3ML) 0.083% nebulizer solution Take 2.5 mg by nebulization every 6 (six) hours as needed for wheezing.     . Calcium Carbonate-Vitamin D (CALTRATE 600+D) 600-400 MG-UNIT per tablet Take 1 tablet by mouth 2 (two) times daily.    Marland Kitchen ELIQUIS 2.5 MG TABS tablet TAKE 1 TABLET BY MOUTH TWICE A DAY 60 tablet 3  . folic acid (FOLVITE) 1 MG tablet Take 2 mg by mouth daily.     . furosemide (LASIX) 20 MG tablet TAKE 1 TABLET BY MOUTH ON MONDAY, WEDNESDAY, AND FRIDAY ONLY 30 tablet 3  . methotrexate (RHEUMATREX) 2.5 MG tablet Take 10 mg by mouth once a week. Caution:Chemotherapy. Protect from light.    . metoprolol succinate (TOPROL-XL) 25 MG 24 hr tablet Take 1 tablet (25 mg total) by mouth 2 (two) times daily. 60 tablet 6  . montelukast (SINGULAIR) 10 MG tablet Take 10 mg by mouth at bedtime.     . Multiple Vitamins-Minerals (MULTIVITAMIN ADULT PO) Take 1 tablet by mouth daily.    . nitroGLYCERIN (NITROSTAT) 0.4 MG SL tablet Place 1 tablet (0.4 mg total) under the tongue every 5 (five) minutes as needed for chest pain. 90 tablet 3  . predniSONE (DELTASONE) 2.5 MG tablet Take 2.5 mg by mouth every other  day.     No current facility-administered medications for this visit.     Past Medical History  Diagnosis Date  . MVP (mitral valve prolapse)   . Pulmonary hypertension     MILD TO MODERATE BY ECHO  . History of chest pain   . Mitral regurgitation     MILD  . Asthma   . Rheumatoid arthritis(714.0)   . Shortness of breath   . H/O: hysterectomy   . LBBB (left bundle branch block)     per discharge note 2003  . PVC (premature ventricular contraction)   . Atrial fibrillation   . Hypertension   . Heart murmur   . S/P mitral valve replacement with bioprosthetic valve 05/18/2012    33mm Edwards Magna Mitral pericardial bioprosthesis   . S/P Maze operation for atrial fibrillation 05/18/2012    Complete biatrial lesion set using cryothermy with oversewing of LA appendage  . COPD (chronic obstructive pulmonary disease)   . CHF (congestive heart failure)   . Pacemaker     ROS:   All systems reviewed and negative except as noted in the HPI.   Past Surgical History  Procedure Laterality Date  . Cholecystectomy    . Ovarian cyst removal    . Hysterectomy    . Appendectomy    . Cataract extraction    . Tee without cardioversion  02/09/2012    Procedure: TRANSESOPHAGEAL ECHOCARDIOGRAM (TEE);  Surgeon: Fay Records, MD;  Location[facility] ENDOSCOPY;  Service: Cardiovascular;  Laterality: N/A;  . Maze  05/18/2012    Procedure: MAZE;  Surgeon: Rexene Alberts, MD;  Location: [place];  Service: Open Heart Surgery;  Laterality: Right;  . Mitral valve replacement  05/18/2012    Procedure: MINIMALLY INVASIVE MITRAL VALVE (MV) REPLACEMENT;  Surgeon: Rexene Alberts, MD;  Location: [place];  Service: Open Heart Surgery;  Laterality: N/A;  . Pacemaker insertion    . Abdominal hysterectomy    . Permanent pacemaker insertion N/A 05/21/2012    Procedure: PERMANENT PACEMAKER INSERTION;  Surgeon: Evans Lance, MD;  Location[facility] CATH LAB;  Service: Cardiovascular;  Laterality: N/A;     History  reviewed. No pertinent family history.   History   Social History  . Marital Status: Single    Spouse Name: N/A  . Number of Children: N/A  . Years of Education: N/A   Occupational History  . Not on file.   Social History Main Topics  . Smoking status: Never Smoker   . Smokeless tobacco: Not on file  . Alcohol Use: No  . Drug Use: No  . Sexual Activity: Not Currently    Birth Control/ Protection: Post-menopausal   Other Topics Concern  . Not on file   Social History Narrative     BP 86/50 mmHg  Pulse 70  Ht 4\' 11"  (1.499 m)  Wt 107 lb 5.4 oz (48.689 kg)  BMI 21.67 kg/m2  Physical Exam:  stable appearing elderly woman, NAD HEENT: Unremarkable Neck:  6 cm JVD, no thyromegally Lungs:  Clear with minimal basilar rales. No wheezes or rhonchi. HEART:  Regular rate rhythm, no murmurs, no rubs, no clicks Abd:  soft, positive bowel sounds, no organomegally, no rebound, no guarding Ext:  2 plus pulses, no edema, no cyanosis, no clubbing Skin:  No rashes no nodules Neuro:  CN II through XII intact, motor grossly intact  DEVICE  Normal device function.  See PaceArt for details. Underlying rhythm is atrial fib/flutter  Assess/Plan:

## 2015-01-17 ENCOUNTER — Telehealth: Payer: Self-pay | Admitting: Internal Medicine

## 2015-01-17 NOTE — Telephone Encounter (Signed)
New message  Pt had question regarding previous OV. Please call back and dsicuss.

## 2015-01-17 NOTE — Telephone Encounter (Signed)
She hasn't had any energy for a good while.  She will call me back if her energy levels do not improve and we can bring her back into device clinic and change back to DDR per Dr Lovena Le if patient would like.  She will call me back next week if she wants to do this

## 2015-01-24 DIAGNOSIS — M0589 Other rheumatoid arthritis with rheumatoid factor of multiple sites: Secondary | ICD-10-CM | POA: Diagnosis not present

## 2015-01-31 ENCOUNTER — Other Ambulatory Visit: Payer: Self-pay | Admitting: Cardiology

## 2015-01-31 NOTE — Telephone Encounter (Signed)
REFILL 

## 2015-02-16 ENCOUNTER — Telehealth: Payer: Self-pay | Admitting: Internal Medicine

## 2015-02-16 NOTE — Telephone Encounter (Signed)
°  1. Has your device fired? N  2. Is you device beeping? n  3. Are you experiencing draining or swelling at device site?n  4. Are you calling to see if we received your device transmission? n  5. Have you passed out?n  Comments: Pt called states that she has never received her remote device

## 2015-02-19 NOTE — Telephone Encounter (Signed)
Spoke w/ pt and informed her that the representative from that company that makes her implanted device ordered her home monitor and we are going to touch base w/ him and when we find out a status on the order we will giver her a call back. Pt verbalized understanding.

## 2015-04-02 ENCOUNTER — Telehealth: Payer: Self-pay | Admitting: Internal Medicine

## 2015-04-02 NOTE — Telephone Encounter (Signed)
New Message  Pt wanted to speak w/ Device about her home monitor. Please call backa nd discuss.

## 2015-04-03 NOTE — Telephone Encounter (Signed)
Pt prefers OV. Pt does not want to enroll in [facility]. She prefers to be seen q10mo. Pt was never enrolled in Lat[facility].   [facility] appt cancelled. Device clinic appt scheduled for 07/18/15.

## 2015-04-03 NOTE — Telephone Encounter (Signed)
LMVOM w/ direct # to device clinic.

## 2015-04-26 DIAGNOSIS — Z79899 Other long term (current) drug therapy: Secondary | ICD-10-CM | POA: Diagnosis not present

## 2015-04-26 DIAGNOSIS — M150 Primary generalized (osteo)arthritis: Secondary | ICD-10-CM | POA: Diagnosis not present

## 2015-04-26 DIAGNOSIS — M0589 Other rheumatoid arthritis with rheumatoid factor of multiple sites: Secondary | ICD-10-CM | POA: Diagnosis not present

## 2015-06-07 DIAGNOSIS — Z23 Encounter for immunization: Secondary | ICD-10-CM | POA: Diagnosis not present

## 2015-06-12 ENCOUNTER — Telehealth: Payer: Self-pay | Admitting: Cardiology

## 2015-06-12 NOTE — Telephone Encounter (Signed)
Nancy Bush is calling because she says she is experiencing some shortness of breath Please call  Thanks

## 2015-06-12 NOTE — Telephone Encounter (Signed)
Pt experienced shortness of breath over past 2 weeks. She wanted to be seen. States SOB mostly w/ exertion. She denies CP, dizziness, fatigue. She states no recent HR or BP checks - does not know if in A Fib. PM functioning normally at last check in June.  Arranged flex visit for patient w/ Ellen Henri. Gave appt date/time/location information.  Advised ER or UC if worse. Pt voiced understanding.

## 2015-06-14 ENCOUNTER — Encounter: Payer: Self-pay | Admitting: Cardiology

## 2015-06-14 ENCOUNTER — Ambulatory Visit: Payer: Medicare Other | Admitting: Cardiology

## 2015-06-14 ENCOUNTER — Ambulatory Visit (INDEPENDENT_AMBULATORY_CARE_PROVIDER_SITE_OTHER): Payer: Medicare Other | Admitting: Cardiology

## 2015-06-14 VITALS — BP 115/56 | HR 60 | Ht 59.0 in | Wt 113.2 lb

## 2015-06-14 DIAGNOSIS — R0609 Other forms of dyspnea: Secondary | ICD-10-CM

## 2015-06-14 DIAGNOSIS — I4891 Unspecified atrial fibrillation: Secondary | ICD-10-CM

## 2015-06-14 LAB — CBC
HCT: 34 % — ABNORMAL LOW (ref 36.0–46.0)
Hemoglobin: 11.3 g/dL — ABNORMAL LOW (ref 12.0–15.0)
MCH: 31.7 pg (ref 26.0–34.0)
MCHC: 33.2 g/dL (ref 30.0–36.0)
MCV: 95.2 fL (ref 78.0–100.0)
MPV: 9.8 fL (ref 8.6–12.4)
PLATELETS: 133 10*3/uL — AB (ref 150–400)
RBC: 3.57 MIL/uL — ABNORMAL LOW (ref 3.87–5.11)
RDW: 14.6 % (ref 11.5–15.5)
WBC: 5.8 10*3/uL (ref 4.0–10.5)

## 2015-06-14 LAB — COMPREHENSIVE METABOLIC PANEL
ALK PHOS: 63 U/L (ref 33–130)
ALT: 11 U/L (ref 6–29)
AST: 26 U/L (ref 10–35)
Albumin: 4.1 g/dL (ref 3.6–5.1)
BUN: 11 mg/dL (ref 7–25)
CO2: 27 mmol/L (ref 20–31)
Calcium: 9.3 mg/dL (ref 8.6–10.4)
Chloride: 102 mmol/L (ref 98–110)
Creat: 0.9 mg/dL — ABNORMAL HIGH (ref 0.60–0.88)
GLUCOSE: 111 mg/dL — AB (ref 65–99)
Potassium: 4.3 mmol/L (ref 3.5–5.3)
SODIUM: 138 mmol/L (ref 135–146)
Total Bilirubin: 0.6 mg/dL (ref 0.2–1.2)
Total Protein: 6.7 g/dL (ref 6.1–8.1)

## 2015-06-14 NOTE — Patient Instructions (Signed)
Medication Instructions:   Your physician recommends that you continue on your current medications as directed. Please refer to the Current Medication list given to you today.   If you need a refill on your cardiac medications before your next appointment, please call your pharmacy.  Labwork:  CBC  CMET    Testing/Procedures: Your physician has requested that you have an echocardiogram. Echocardiography is a painless test that uses sound waves to create images of your heart. It provides your doctor with information about the size and shape of your heart and how well your heart's chambers and valves are working. This procedure takes approximately one hour. There are no restrictions for this procedure.   Follow-Up:  WILL BE BASED UP RESULTS   Any Other Special Instructions Will Be Listed Below (If Applicable).

## 2015-06-14 NOTE — Progress Notes (Signed)
06/14/2015 Nancy Bush   09/27/1925  [id number]  Primary Physician  Melinda Crutch, MD Primary Cardiologist: Dr. Percival Spanish Electrophysiologist: Dr. Lovena Le  Reason for Visit/CC: Dyspnea  HPI:  The patient is a 81 y/o old female followed by Dr. Percival Spanish. She is also followed in the EP clinic by Dr. Lovena Le. Her cardiac history is significant for mitral valve replacement, atrial fibrillation/atrial flutter as well as bradycardia. She had a bioprosthetic mitral valve and a Maze procedure in 05/2012. She's also had pacemaker placement for bradycardia. She is on Eliquis for anticoagulation given her PAF and CHA2DS2 VASc score of at least > 2 for Age >54 + female sex. She has a history of normal systolic function. Her last 2-D echocardiogram was in November 2014 which revealed an EF of 55-60%. She has no documented history of CAD.  She presents to clinic today with a chief complaint of dyspnea. She has noticed shortness of breath off and on for the last 2 weeks. She also has dyspnea with moderate exertion such as sweeping and vacuuming around her home. She denies any dyspnea with light exertion. No chest pain, palpitations, dizziness, syncope/ near syncope, weight gain, LEE or melena.  No orthopnea/ PND. She reports full medication compliance.   EKG shows atrial fibrillation with a controlled ventricular response of 60 bpm. Blood pressure is stable at 115/56. Physical exam is unremarkable.   Current Outpatient Prescriptions  Medication Sig Dispense Refill  . albuterol (PROVENTIL) (2.5 MG/3ML) 0.083% nebulizer solution Take 2.5 mg by nebulization every 6 (six) hours as needed for wheezing.     . Calcium Carbonate-Vitamin D (CALTRATE 600+D) 600-400 MG-UNIT per tablet Take 1 tablet by mouth 2 (two) times daily.    Marland Kitchen ELIQUIS 2.5 MG TABS tablet TAKE 1 TABLET BY MOUTH TWICE A DAY 60 tablet 11  . folic acid (FOLVITE) 1 MG tablet Take 2 mg by mouth daily.     . furosemide (LASIX) 20 MG tablet TAKE 1  TABLET BY MOUTH ON MONDAY, WEDNESDAY, AND FRIDAY ONLY 30 tablet 11  . methotrexate (RHEUMATREX) 2.5 MG tablet Take 10 mg by mouth once a week. Caution:Chemotherapy. Protect from light.    . metoprolol succinate (TOPROL-XL) 25 MG 24 hr tablet TAKE 1 TABLET BY MOUTH TWICE A DAY 60 tablet 11  . montelukast (SINGULAIR) 10 MG tablet Take 10 mg by mouth at bedtime.     . Multiple Vitamins-Minerals (MULTIVITAMIN ADULT PO) Take 1 tablet by mouth daily.    . nitroGLYCERIN (NITROSTAT) 0.4 MG SL tablet Place 0.4 mg under the tongue every 5 (five) minutes as needed for chest pain (x 3 tabs daily).    . predniSONE (DELTASONE) 2.5 MG tablet Take 2.5 mg by mouth every other day.     No current facility-administered medications for this visit.    Allergies  Allergen Reactions  . Promethazine Hcl Other (See Comments)    unknown  . Sulfonamide Derivatives Other (See Comments)    unknown  . Tequin Other (See Comments)    hallucinations     Social History   Social History  . Marital Status: Single    Spouse Name: N/A  . Number of Children: N/A  . Years of Education: N/A   Occupational History  . Not on file.   Social History Main Topics  . Smoking status: Never Smoker   . Smokeless tobacco: Not on file  . Alcohol Use: No  . Drug Use: No  . Sexual Activity: Not Currently  Birth Control/ Protection: Post-menopausal   Other Topics Concern  . Not on file   Social History Narrative     Review of Systems: General: negative for chills, fever, night sweats or weight changes.  Cardiovascular: negative for chest pain, dyspnea on exertion, edema, orthopnea, palpitations, paroxysmal nocturnal dyspnea or shortness of breath Dermatological: negative for rash Respiratory: negative for cough or wheezing Urologic: negative for hematuria Abdominal: negative for nausea, vomiting, diarrhea, bright red blood per rectum, melena, or hematemesis Neurologic: negative for visual changes, syncope, or  dizziness All other systems reviewed and are otherwise negative except as noted above.    Blood pressure 115/56, pulse 60, height 4\' 11"  (1.499 m), weight 113 lb 3.2 oz (51.347 kg).  General appearance: alert, cooperative and no distress Neck: no carotid bruit and no JVD Lungs: clear to auscultation bilaterally Heart: regular rate and rhythm, S1, S2 normal, no murmur, click, rub or gallop Extremities: no LEE Pulses: 2+ and symmetric Skin: warm and dry Neurologic: Grossly normal  EKG atrial fibrillation with a CVR 60 bpm   ASSESSMENT AND PLAN:   1. Dyspnea: physical exam is unremarkable. Her EKG shows chronic atrial fibrillation but with controlled ventricular response. She denies any chest pain, weight gain, lower extremithy edema, orthopnea, PND, syncope/near-syncope or palpitations. Given her history of mitral valve disease, status post mitral valve replacement, we will check a 2-D echocardiogram to reassess valve anatomy and assess left ventricular systolic function and wall motion. We'll also obtain a CBC and compressive metabolic panel.  2. MVR: in 2013. Given recent dyspnea, will recheck a 2D echo.  3. Atrial Fibrillation: CVR in the 60s. She is anticoagulated with Eliquis. Continue metoprolol.  4. Bradycardia: s/p PPM, followed by Dr. Lovena Le.    Lyda Jester PA-C 06/14/2015 5:12 PM

## 2015-06-20 ENCOUNTER — Other Ambulatory Visit: Payer: Self-pay

## 2015-06-20 ENCOUNTER — Ambulatory Visit (HOSPITAL_COMMUNITY): Payer: Medicare Other | Attending: Internal Medicine

## 2015-06-20 DIAGNOSIS — I4891 Unspecified atrial fibrillation: Secondary | ICD-10-CM

## 2015-06-20 DIAGNOSIS — I517 Cardiomegaly: Secondary | ICD-10-CM | POA: Diagnosis not present

## 2015-06-20 DIAGNOSIS — R0600 Dyspnea, unspecified: Secondary | ICD-10-CM | POA: Diagnosis present

## 2015-06-20 DIAGNOSIS — I371 Nonrheumatic pulmonary valve insufficiency: Secondary | ICD-10-CM | POA: Diagnosis not present

## 2015-06-20 DIAGNOSIS — I358 Other nonrheumatic aortic valve disorders: Secondary | ICD-10-CM | POA: Diagnosis not present

## 2015-06-20 DIAGNOSIS — R0602 Shortness of breath: Secondary | ICD-10-CM | POA: Insufficient documentation

## 2015-06-20 DIAGNOSIS — I071 Rheumatic tricuspid insufficiency: Secondary | ICD-10-CM | POA: Insufficient documentation

## 2015-06-20 DIAGNOSIS — R0609 Other forms of dyspnea: Secondary | ICD-10-CM | POA: Diagnosis not present

## 2015-06-22 ENCOUNTER — Telehealth: Payer: Self-pay | Admitting: *Deleted

## 2015-06-22 NOTE — Telephone Encounter (Signed)
Pt was returning my call.  She was advised that her lab results was normal.  Pt verbalized understanding.

## 2015-06-22 NOTE — Telephone Encounter (Signed)
-----   Message from Consuelo Pandy, [place] sent at 06/18/2015  4:55 PM EST ----- Labs look normal. Renal function, liver function and electrolytes are ok.

## 2015-07-18 ENCOUNTER — Ambulatory Visit (INDEPENDENT_AMBULATORY_CARE_PROVIDER_SITE_OTHER): Payer: Medicare Other | Admitting: *Deleted

## 2015-07-18 ENCOUNTER — Encounter: Payer: Self-pay | Admitting: Internal Medicine

## 2015-07-18 DIAGNOSIS — R001 Bradycardia, unspecified: Secondary | ICD-10-CM | POA: Diagnosis not present

## 2015-07-18 DIAGNOSIS — I482 Chronic atrial fibrillation, unspecified: Secondary | ICD-10-CM

## 2015-07-18 LAB — CUP PACEART INCLINIC DEVICE CHECK
Brady Statistic RA Percent Paced: 1 % — CL
Brady Statistic RV Percent Paced: 100 %
Implantable Lead Implant Date: 20131018
Implantable Lead Location: 753859
Implantable Lead Serial Number: 29232066
Lead Channel Impedance Value: 711 Ohm
Lead Channel Setting Pacing Amplitude: 1.2 V
Lead Channel Setting Pacing Pulse Width: 0.4 ms
MDC IDC LEAD IMPLANT DT: 20131018
MDC IDC LEAD LOCATION: 753860
MDC IDC LEAD MODEL: 4135
MDC IDC LEAD MODEL: 4136
MDC IDC LEAD SERIAL: 29233089
MDC IDC MSMT LEADCHNL RA IMPEDANCE VALUE: 513 Ohm
MDC IDC MSMT LEADCHNL RA SENSING INTR AMPL: 2.1 mV
MDC IDC MSMT LEADCHNL RV PACING THRESHOLD AMPLITUDE: 0.7 V
MDC IDC MSMT LEADCHNL RV PACING THRESHOLD PULSEWIDTH: 0.4 ms
MDC IDC SESS DTM: 20161214050000
MDC IDC SET LEADCHNL RA PACING AMPLITUDE: 2 V
MDC IDC SET LEADCHNL RV SENSING SENSITIVITY: 2.5 mV
Pulse Gen Serial Number: 111608

## 2015-07-18 NOTE — Progress Notes (Signed)
Pacemaker check in clinic. Normal device function. Thresholds, sensing, impedances consistent with previous measurements. Device programmed to maximize longevity. No mode switches. 2 high ventricular rates noted-- longest 8 beats. Device programmed at appropriate safety margins. Histogram distribution appropriate for patient activity level (aside from AT/AF). Device programmed to optimize intrinsic conduction. Estimated longevity 9 years. Patient declines remote follow-up. ROV with GT in June.

## 2015-10-10 DIAGNOSIS — D649 Anemia, unspecified: Secondary | ICD-10-CM | POA: Diagnosis not present

## 2015-10-10 DIAGNOSIS — J45909 Unspecified asthma, uncomplicated: Secondary | ICD-10-CM | POA: Diagnosis not present

## 2015-10-25 DIAGNOSIS — M0589 Other rheumatoid arthritis with rheumatoid factor of multiple sites: Secondary | ICD-10-CM | POA: Diagnosis not present

## 2015-11-07 DIAGNOSIS — M0589 Other rheumatoid arthritis with rheumatoid factor of multiple sites: Secondary | ICD-10-CM | POA: Diagnosis not present

## 2015-11-07 DIAGNOSIS — M150 Primary generalized (osteo)arthritis: Secondary | ICD-10-CM | POA: Diagnosis not present

## 2015-11-07 DIAGNOSIS — Z79899 Other long term (current) drug therapy: Secondary | ICD-10-CM | POA: Diagnosis not present

## 2015-12-03 DIAGNOSIS — R300 Dysuria: Secondary | ICD-10-CM | POA: Diagnosis not present

## 2015-12-03 DIAGNOSIS — R319 Hematuria, unspecified: Secondary | ICD-10-CM | POA: Diagnosis not present

## 2015-12-17 DIAGNOSIS — N390 Urinary tract infection, site not specified: Secondary | ICD-10-CM | POA: Diagnosis not present

## 2015-12-18 DIAGNOSIS — H52203 Unspecified astigmatism, bilateral: Secondary | ICD-10-CM | POA: Diagnosis not present

## 2015-12-18 DIAGNOSIS — D3131 Benign neoplasm of right choroid: Secondary | ICD-10-CM | POA: Diagnosis not present

## 2015-12-18 DIAGNOSIS — H353122 Nonexudative age-related macular degeneration, left eye, intermediate dry stage: Secondary | ICD-10-CM | POA: Diagnosis not present

## 2015-12-18 DIAGNOSIS — H353112 Nonexudative age-related macular degeneration, right eye, intermediate dry stage: Secondary | ICD-10-CM | POA: Diagnosis not present

## 2015-12-25 ENCOUNTER — Ambulatory Visit (INDEPENDENT_AMBULATORY_CARE_PROVIDER_SITE_OTHER): Payer: Medicare Other | Admitting: Cardiology

## 2015-12-25 ENCOUNTER — Encounter: Payer: Self-pay | Admitting: Cardiology

## 2015-12-25 VITALS — BP 133/67 | HR 71 | Ht 59.0 in | Wt 113.4 lb

## 2015-12-25 DIAGNOSIS — R5383 Other fatigue: Secondary | ICD-10-CM | POA: Diagnosis not present

## 2015-12-25 NOTE — Patient Instructions (Signed)
Medication Instructions:  Continue current medications  Labwork: TSH  Testing/Procedures: NONE  Follow-Up: Your physician wants you to follow-up in: 1 Year. You will receive a reminder letter in the mail two months in advance. If you don't receive a letter, please call our office to schedule the follow-up appointment.  Any Other Special Instructions Will Be Listed Below (If Applicable).  If you need a refill on your cardiac medications before your next appointment, please call your pharmacy.

## 2015-12-25 NOTE — Progress Notes (Signed)
HPI  The patient presents for followup of mitral valve replacement. She had a bioprosthetic mitral valve and a Maze procedure in 05/2012.  She's also had pacemaker placement for bradycardia. She's had paroxysmal atrial fibrillation/flutter.  Since I last saw her she has done well.  She does complain of fatigue.  She saw one of our APPs last fall and did have some dyspnea. However, an echocardiogram demonstrated well-preserved ejection fraction with normal stable valve replacement. Her only complaint now is significant fatigue. She says she is tired when she tries to do things. She does say she sleeps okay. She's not describing presyncope or syncope. She's not describing shortness of breath, PND or orthopnea. She has some rare short-lived palpitations.  I do note that she was seen recently in device clinic and had normal device function.  Allergies  Allergen Reactions  . Promethazine Hcl Other (See Comments)    unknown  . Sulfonamide Derivatives Other (See Comments)    unknown  . Tequin Other (See Comments)    hallucinations     Current Outpatient Prescriptions  Medication Sig Dispense Refill  . albuterol (PROVENTIL HFA;VENTOLIN HFA) 108 (90 Base) MCG/ACT inhaler Inhale 2 puffs into the lungs every 6 (six) hours as needed for wheezing or shortness of breath.    Marland Kitchen albuterol (PROVENTIL) (2.5 MG/3ML) 0.083% nebulizer solution Take 2.5 mg by nebulization every 6 (six) hours as needed for wheezing.     . Calcium Carbonate-Vitamin D (CALTRATE 600+D) 600-400 MG-UNIT per tablet Take 1 tablet by mouth 2 (two) times daily.    Marland Kitchen ELIQUIS 2.5 MG TABS tablet TAKE 1 TABLET BY MOUTH TWICE A DAY 60 tablet 11  . folic acid (FOLVITE) 1 MG tablet Take 2 mg by mouth daily.     . furosemide (LASIX) 20 MG tablet TAKE 1 TABLET BY MOUTH ON MONDAY, WEDNESDAY, AND FRIDAY ONLY 30 tablet 11  . methotrexate (RHEUMATREX) 2.5 MG tablet Take 10 mg by mouth once a week. Caution:Chemotherapy. Protect from light.    .  metoprolol succinate (TOPROL-XL) 25 MG 24 hr tablet TAKE 1 TABLET BY MOUTH TWICE A DAY 60 tablet 11  . montelukast (SINGULAIR) 10 MG tablet Take 10 mg by mouth at bedtime.     . Multiple Vitamins-Minerals (ICAPS AREDS 2 PO) Take 1 tablet by mouth daily.    . Multiple Vitamins-Minerals (MULTIVITAMIN ADULT PO) Take 1 tablet by mouth daily.    . nitroGLYCERIN (NITROSTAT) 0.4 MG SL tablet Place 0.4 mg under the tongue every 5 (five) minutes as needed for chest pain (x 3 tabs daily).    . predniSONE (DELTASONE) 2.5 MG tablet Take 2.5 mg by mouth every other day.     No current facility-administered medications for this visit.    Past Medical History  Diagnosis Date  . MVP (mitral valve prolapse)   . Pulmonary hypertension ([place])     MILD TO MODERATE BY ECHO  . History of chest pain   . Mitral regurgitation     MILD  . Asthma   . Rheumatoid arthritis(714.0)   . Shortness of breath   . H/O: hysterectomy   . LBBB (left bundle branch block)     per discharge note 2003  . PVC (premature ventricular contraction)   . Atrial fibrillation ([place])   . Hypertension   . Heart murmur   . S/P mitral valve replacement with bioprosthetic valve 05/18/2012    68mm Edwards Magna Mitral pericardial bioprosthesis   . S/P Maze operation for atrial  fibrillation 05/18/2012    Complete biatrial lesion set using cryothermy with oversewing of LA appendage  . COPD (chronic obstructive pulmonary disease) ([place])   . CHF (congestive heart failure) ([place])   . Pacemaker     Past Surgical History  Procedure Laterality Date  . Cholecystectomy    . Ovarian cyst removal    . Hysterectomy    . Appendectomy    . Cataract extraction    . Tee without cardioversion  02/09/2012    Procedure: TRANSESOPHAGEAL ECHOCARDIOGRAM (TEE);  Surgeon: Fay Records, MD;  Location: [facility] ENDOSCOPY;  Service: Cardiovascular;  Laterality: N/A;  . Maze  05/18/2012    Procedure: MAZE;  Surgeon: Rexene Alberts, MD;  Location: [place];  Service:  Open Heart Surgery;  Laterality: Right;  . Mitral valve replacement  05/18/2012    Procedure: MINIMALLY INVASIVE MITRAL VALVE (MV) REPLACEMENT;  Surgeon: Rexene Alberts, MD;  Location: [place];  Service: Open Heart Surgery;  Laterality: N/A;  . Pacemaker insertion    . Abdominal hysterectomy    . Permanent pacemaker insertion N/A 05/21/2012    Procedure: PERMANENT PACEMAKER INSERTION;  Surgeon: Evans Lance, MD;  Location: [facility] CATH LAB;  Service: Cardiovascular;  Laterality: N/A;   ROS:  HOH.  Otherwise as stated in the HPI and negative for all other systems.  PHYSICAL EXAM BP 133/67 mmHg  Pulse 71  Ht 4\' 11"  (1.499 m)  Wt 113 lb 6.4 oz (51.438 kg)  BMI 22.89 kg/m2  SpO2 71%  GENERAL:  Well appearing but looks her stated age. NECK:  No jugular venous distention, waveform within normal limits, carotid upstroke brisk and symmetric, no bruits, no thyromegaly LUNGS:  Clear to auscultation bilaterally BACK:  No CVA tenderness CHEST:  Unremarkable, well healed left thoracotomy scar, and pacer pocket HEART:  PMI not displaced or sustained,S1 and S2 within normal limits, no S3,  no clicks, no rubs, soft apical systolic murmur, no diastolic murmurs ABD:  Flat, positive bowel sounds normal in frequency in pitch, no bruits, no rebound, no guarding, no midline pulsatile mass, no hepatomegaly, no splenomegaly EXT:  2 plus pulses throughout, no edema, no cyanosis no clubbing   ASSESSMENT AND PLAN   MVR:  Her echocardiogram in November of last year looked fine. No change in therapy is indicated.  ATRIAL FIB/FLUTTER:  She is not having symptomatic arrhythmias. She tolerates rate control and anticoagulation. Nancy Bush has a CHA2DS2 - VASc score of 4 with a risk of stroke of 4%.  She will continue on current therapy.  FATIGUE:  I don't see a clear etiology. I reviewed her recent labs.  She is not anemic.  However, she has not had a TSH so I will order this.

## 2015-12-26 LAB — TSH: TSH: 4.16 m[IU]/L

## 2016-01-05 DIAGNOSIS — D692 Other nonthrombocytopenic purpura: Secondary | ICD-10-CM | POA: Diagnosis not present

## 2016-01-05 DIAGNOSIS — T148 Other injury of unspecified body region: Secondary | ICD-10-CM | POA: Diagnosis not present

## 2016-01-09 ENCOUNTER — Telehealth: Payer: Self-pay | Admitting: Cardiology

## 2016-01-09 NOTE — Telephone Encounter (Signed)
New message     Patient calling    Patient calling the office for samples of medication:   1.  What medication and dosage are you requesting samples for? eliquis  2.5 mg  2.  Are you currently out of this medication? yes

## 2016-01-09 NOTE — Telephone Encounter (Signed)
Patient aware samples are at the front desk for pick up  

## 2016-02-04 ENCOUNTER — Other Ambulatory Visit: Payer: Self-pay | Admitting: Cardiology

## 2016-02-07 DIAGNOSIS — M0589 Other rheumatoid arthritis with rheumatoid factor of multiple sites: Secondary | ICD-10-CM | POA: Diagnosis not present

## 2016-02-11 ENCOUNTER — Other Ambulatory Visit: Payer: Self-pay | Admitting: Cardiology

## 2016-02-25 DIAGNOSIS — W57XXXA Bitten or stung by nonvenomous insect and other nonvenomous arthropods, initial encounter: Secondary | ICD-10-CM | POA: Diagnosis not present

## 2016-02-25 DIAGNOSIS — S70262A Insect bite (nonvenomous), left hip, initial encounter: Secondary | ICD-10-CM | POA: Diagnosis not present

## 2016-02-26 ENCOUNTER — Other Ambulatory Visit: Payer: Self-pay | Admitting: Cardiology

## 2016-04-01 ENCOUNTER — Ambulatory Visit (INDEPENDENT_AMBULATORY_CARE_PROVIDER_SITE_OTHER): Payer: Medicare Other | Admitting: Internal Medicine

## 2016-04-01 ENCOUNTER — Encounter: Payer: Self-pay | Admitting: Internal Medicine

## 2016-04-01 VITALS — BP 110/64 | HR 60 | Ht 59.0 in | Wt 115.4 lb

## 2016-04-01 DIAGNOSIS — R531 Weakness: Secondary | ICD-10-CM | POA: Diagnosis not present

## 2016-04-01 DIAGNOSIS — I482 Chronic atrial fibrillation, unspecified: Secondary | ICD-10-CM

## 2016-04-01 NOTE — Patient Instructions (Signed)
Medication Instructions:  Your physician recommends that you continue on your current medications as directed. Please refer to the Current Medication list given to you today.   Labwork: None ordered   Testing/Procedures: None ordered   Follow-Up: Your physician wants you to follow-up in: 12 months with Dr Knox Saliva will receive a reminder letter in the mail two months in advance. If you don't receive a letter, please call our office to schedule the follow-up appointment.  Remote monitoring is used to monitor your Pacemaker  from home. This monitoring reduces the number of office visits required to check your device to one time per year. It allows [place] to keep an eye on the functioning of your device to ensure it is working properly. You are scheduled for a device check from home on 07/01/16. You may send your transmission at any time that day. If you have a wireless device, the transmission will be sent automatically. After your physician reviews your transmission, you will receive a postcard with your next transmission date.     Any Other Special Instructions Will Be Listed Below (If Applicable).     If you need a refill on your cardiac medications before your next appointment, please call your pharmacy.

## 2016-04-01 NOTE — Progress Notes (Signed)
HPI Nancy Bush is here today for followup. She has a history of chronic diastolic heart failure, and mitral regurgitation status post mitral valve replacement, atrial arrhythmias status post maze procedure.  She is s/p PPM insertion.  She denies chest pain or shortness of breath. She does have fatigue and weakness. No syncope. She had a TSH several months ago which was ok. Allergies  Allergen Reactions  . Promethazine Hcl Other (See Comments)    unknown  . Sulfonamide Derivatives Other (See Comments)    unknown  . Tequin Other (See Comments)    hallucinations      Current Outpatient Prescriptions  Medication Sig Dispense Refill  . albuterol (PROVENTIL HFA;VENTOLIN HFA) 108 (90 Base) MCG/ACT inhaler Inhale 2 puffs into the lungs every 6 (six) hours as needed for wheezing or shortness of breath.    Marland Kitchen albuterol (PROVENTIL) (2.5 MG/3ML) 0.083% nebulizer solution Take 2.5 mg by nebulization every 6 (six) hours as needed for wheezing.     . Calcium Carbonate-Vitamin D (CALTRATE 600+D) 600-400 MG-UNIT per tablet Take 1 tablet by mouth 2 (two) times daily.    Marland Kitchen ELIQUIS 2.5 MG TABS tablet TAKE 1 TABLET BY MOUTH TWICE A DAY 60 tablet 9  . folic acid (FOLVITE) 1 MG tablet Take 2 mg by mouth daily.     . furosemide (LASIX) 20 MG tablet TAKE 1 TABLET BY MOUTH ON MONDAY, WEDNESDAY, AND FRIDAY ONLY 30 tablet 11  . methotrexate (RHEUMATREX) 2.5 MG tablet Take 10 mg by mouth once a week. Caution:Chemotherapy. Protect from light.    . metoprolol succinate (TOPROL-XL) 25 MG 24 hr tablet TAKE 1 TABLET BY MOUTH TWICE A DAY 180 tablet 2  . montelukast (SINGULAIR) 10 MG tablet Take 10 mg by mouth at bedtime.     . Multiple Vitamins-Minerals (ICAPS AREDS 2 PO) Take 2 tablets by mouth daily.     . Multiple Vitamins-Minerals (MULTIVITAMIN ADULT PO) Take 1 tablet by mouth daily.    . nitroGLYCERIN (NITROSTAT) 0.4 MG SL tablet Place 0.4 mg under the tongue every 5 (five) minutes as needed for chest pain (x 3 tabs  daily).    . predniSONE (DELTASONE) 2.5 MG tablet Take 2.5 mg by mouth every other day.     No current facility-administered medications for this visit.      Past Medical History:  Diagnosis Date  . Asthma   . Atrial fibrillation ([place])   . CHF (congestive heart failure) ([place])   . COPD (chronic obstructive pulmonary disease) ([place])   . H/O: hysterectomy   . Hypertension   . LBBB (left bundle branch block)    per discharge note 2003  . Mitral regurgitation    MILD  . MVP (mitral valve prolapse)   . Pacemaker   . Pulmonary hypertension ([place])    MILD TO MODERATE BY ECHO  . PVC (premature ventricular contraction)   . Rheumatoid arthritis(714.0)   . S/P Maze operation for atrial fibrillation 05/18/2012   Complete biatrial lesion set using cryothermy with oversewing of LA appendage  . S/P mitral valve replacement with bioprosthetic valve 05/18/2012   68mm Edwards [facility] Mitral pericardial bioprosthesis     ROS:   All systems reviewed and negative except as noted in the HPI.   Past Surgical History:  Procedure Laterality Date  . ABDOMINAL HYSTERECTOMY    . APPENDECTOMY    . CATARACT EXTRACTION    . CHOLECYSTECTOMY    . hysterectomy    . MAZE  05/18/2012  Procedure: MAZE;  Surgeon: Rexene Alberts, MD;  Location: [place];  Service: Open Heart Surgery;  Laterality: Right;  . MITRAL VALVE REPLACEMENT  05/18/2012   Procedure: MINIMALLY INVASIVE MITRAL VALVE (MV) REPLACEMENT;  Surgeon: Rexene Alberts, MD;  Location: [place];  Service: Open Heart Surgery;  Laterality: N/A;  . OVARIAN CYST REMOVAL    . PACEMAKER INSERTION    . PERMANENT PACEMAKER INSERTION N/A 05/21/2012   Procedure: PERMANENT PACEMAKER INSERTION;  Surgeon: Evans Lance, MD;  Location: [facility] CATH LAB;  Service: Cardiovascular;  Laterality: N/A;  . TEE WITHOUT CARDIOVERSION  02/09/2012   Procedure: TRANSESOPHAGEAL ECHOCARDIOGRAM (TEE);  Surgeon: Fay Records, MD;  Location: [facility] ENDOSCOPY;  Service: Cardiovascular;   Laterality: N/A;     No family history on file.   Social History   Social History  . Marital status: Single    Spouse name: N/A  . Number of children: N/A  . Years of education: N/A   Occupational History  . Not on file.   Social History Main Topics  . Smoking status: Never Smoker  . Smokeless tobacco: Not on file  . Alcohol use No  . Drug use: No  . Sexual activity: Not Currently    Birth control/ protection: Post-menopausal   Other Topics Concern  . Not on file   Social History Narrative  . No narrative on file     BP 110/64   Pulse 60   Ht 4\' 11"  (1.499 m)   Wt 115 lb 6.4 oz (52.3 kg)   SpO2 99%   BMI 23.31 kg/m   Physical Exam:  stable appearing elderly woman, NAD HEENT: Unremarkable Neck:  6 cm JVD, no thyromegally Lungs:  Clear with minimal basilar rales. No wheezes or rhonchi. HEART:  Regular rate rhythm, no murmurs, no rubs, no clicks Abd:  soft, positive bowel sounds, no organomegally, no rebound, no guarding Ext:  2 plus pulses, no edema, no cyanosis, no clubbing Skin:  No rashes no nodules Neuro:  CN II through XII intact, motor grossly intact  DEVICE  Normal device function.  See PaceArt for details. Underlying rhythm is atrial fib/flutter. I tried to pace terminate her atrial flutter but to no success.  Assess/Plan:  1. CHB - she is s/p PPM 2. PPM - her [place] [employer] DDD PM is programmed DDI. Will recheck in several months. 3. HTN - her blood pressure well controlled. 4. Fatigue and weakness - no obvious cause other than chronic atrial fib/flutter, CHB, and chronic ventricular pacing.  Mikle Bosworth.D.

## 2016-04-04 LAB — CUP PACEART INCLINIC DEVICE CHECK
Date Time Interrogation Session: 20170829040000
Implantable Lead Implant Date: 20131018
Implantable Lead Location: 753859
Implantable Lead Model: 4135
Implantable Lead Model: 4136
Implantable Lead Serial Number: 29232066
Implantable Lead Serial Number: 29233089
Lead Channel Impedance Value: 692 Ohm
Lead Channel Sensing Intrinsic Amplitude: 1.9 mV
Lead Channel Setting Pacing Amplitude: 1.2 V
Lead Channel Setting Pacing Pulse Width: 0.4 ms
Lead Channel Setting Sensing Sensitivity: 2.5 mV
MDC IDC LEAD IMPLANT DT: 20131018
MDC IDC LEAD LOCATION: 753860
MDC IDC MSMT LEADCHNL RA IMPEDANCE VALUE: 510 Ohm
MDC IDC SET LEADCHNL RA PACING AMPLITUDE: 2 V
Pulse Gen Serial Number: 111608

## 2016-04-11 ENCOUNTER — Telehealth: Payer: Self-pay | Admitting: Cardiology

## 2016-04-11 MED ORDER — AMOXICILLIN 500 MG PO CAPS: ORAL_CAPSULE | ORAL | 3 refills | Status: DC

## 2016-04-11 NOTE — Telephone Encounter (Signed)
Returned call to patient.She stated she broke a tooth and needs to see a oral surgeon to have removed.Advised I will speak to Dr.Hochrein and call her back.

## 2016-04-11 NOTE — Telephone Encounter (Signed)
Please call,she needs to go to an oral surgeon. She needs to talk to you about the oral surgeon and also about a card she needs concerning her valves.

## 2016-04-11 NOTE — Telephone Encounter (Signed)
Returned call to patient Dr.Hochrein advised does not need to hold Eliquis for 1 tooth extraction.Advised needs antibiotic before dental procedures.Amoxicillin prescription sent to pharmacy.

## 2016-04-12 DIAGNOSIS — N390 Urinary tract infection, site not specified: Secondary | ICD-10-CM | POA: Diagnosis not present

## 2016-05-05 DIAGNOSIS — Z23 Encounter for immunization: Secondary | ICD-10-CM | POA: Diagnosis not present

## 2016-05-07 DIAGNOSIS — M0589 Other rheumatoid arthritis with rheumatoid factor of multiple sites: Secondary | ICD-10-CM | POA: Diagnosis not present

## 2016-05-07 DIAGNOSIS — M150 Primary generalized (osteo)arthritis: Secondary | ICD-10-CM | POA: Diagnosis not present

## 2016-05-07 DIAGNOSIS — Z79899 Other long term (current) drug therapy: Secondary | ICD-10-CM | POA: Diagnosis not present

## 2016-05-21 ENCOUNTER — Telehealth: Payer: Self-pay | Admitting: Internal Medicine

## 2016-05-21 NOTE — Telephone Encounter (Signed)
Returned patient call and gave her the [employer] tech support to request a new ID card for her device.

## 2016-05-21 NOTE — Telephone Encounter (Signed)
New message      Calling to inquire about a card for her pacemaker

## 2016-07-01 ENCOUNTER — Telehealth: Payer: Self-pay | Admitting: Cardiology

## 2016-07-01 ENCOUNTER — Encounter: Payer: Medicare Other | Admitting: *Deleted

## 2016-07-01 NOTE — Telephone Encounter (Signed)
Spoke with pt and reminded pt of remote transmission that is due today. Pt stated that she doesn't want to do remote transmissions she wants to come to the office to have her PPM checked. Pt agreed to an appt on 10-01-2016 at 2:00 PM.

## 2016-07-04 ENCOUNTER — Encounter: Payer: Self-pay | Admitting: Cardiology

## 2016-07-14 ENCOUNTER — Telehealth: Payer: Self-pay | Admitting: Internal Medicine

## 2016-07-14 NOTE — Telephone Encounter (Signed)
Spoke w/ pt and informed her that it is ok to do in office visits only. Pt verbalized understanding.

## 2016-07-14 NOTE — Telephone Encounter (Signed)
New message       Talk to someone in device clinic.  Pt received a letter stating that she missed her 07-01-16 remote check.  She does not want to do home checks.  She has an appt in feb to come in the office for a home check.  Please call and let her know if she can only do office checks.

## 2016-08-28 ENCOUNTER — Other Ambulatory Visit: Payer: Self-pay | Admitting: Family Medicine

## 2016-08-28 DIAGNOSIS — R109 Unspecified abdominal pain: Secondary | ICD-10-CM | POA: Diagnosis not present

## 2016-09-04 ENCOUNTER — Ambulatory Visit
Admission: RE | Admit: 2016-09-04 | Discharge: 2016-09-04 | Disposition: A | Discharge status: Home / Self Care | Payer: Medicare Other | Source: Ambulatory Visit | Attending: Family Medicine | Admitting: Family Medicine

## 2016-09-04 DIAGNOSIS — R109 Unspecified abdominal pain: Secondary | ICD-10-CM

## 2016-09-19 ENCOUNTER — Telehealth: Payer: Self-pay | Admitting: Cardiology

## 2016-09-19 NOTE — Telephone Encounter (Signed)
LM for patient that no samples of eliquis 2.5mg  are available. Advised to call back if Rx is needed

## 2016-09-19 NOTE — Telephone Encounter (Signed)
New message    Patient calling the office for samples of medication:   1.  What medication and dosage are you requesting samples for? Eliquis 2.5mg    2.  Are you currently out of this medication? No, no completely.

## 2016-10-01 ENCOUNTER — Ambulatory Visit (INDEPENDENT_AMBULATORY_CARE_PROVIDER_SITE_OTHER): Payer: Medicare Other | Admitting: *Deleted

## 2016-10-01 DIAGNOSIS — R001 Bradycardia, unspecified: Secondary | ICD-10-CM | POA: Diagnosis not present

## 2016-10-01 DIAGNOSIS — I4891 Unspecified atrial fibrillation: Secondary | ICD-10-CM

## 2016-10-01 DIAGNOSIS — Z950 Presence of cardiac pacemaker: Secondary | ICD-10-CM

## 2016-10-01 LAB — CUP PACEART INCLINIC DEVICE CHECK
Brady Statistic RA Percent Paced: 19 %
Brady Statistic RV Percent Paced: 100 %
Implantable Lead Implant Date: 20131018
Implantable Lead Model: 4135
Implantable Lead Model: 4136
Implantable Lead Serial Number: 29232066
Lead Channel Pacing Threshold Amplitude: 0.7 V
Lead Channel Pacing Threshold Amplitude: 0.8 V
Lead Channel Pacing Threshold Pulse Width: 0.4 ms
Lead Channel Pacing Threshold Pulse Width: 0.4 ms
Lead Channel Sensing Intrinsic Amplitude: 2.7 mV
Lead Channel Setting Sensing Sensitivity: 2.5 mV
MDC IDC LEAD IMPLANT DT: 20131018
MDC IDC LEAD LOCATION: 753859
MDC IDC LEAD LOCATION: 753860
MDC IDC LEAD SERIAL: 29233089
MDC IDC MSMT LEADCHNL RA IMPEDANCE VALUE: 508 Ohm
MDC IDC MSMT LEADCHNL RV IMPEDANCE VALUE: 631 Ohm
MDC IDC PG IMPLANT DT: 20131018
MDC IDC SESS DTM: 20180228050000
MDC IDC SET LEADCHNL RA PACING AMPLITUDE: 2 V
MDC IDC SET LEADCHNL RV PACING AMPLITUDE: 1.3 V
MDC IDC SET LEADCHNL RV PACING PULSEWIDTH: 0.4 ms
Pulse Gen Serial Number: 111608

## 2016-10-01 NOTE — Patient Instructions (Signed)
Your physician wants you to follow-up in: September 2018 with Dr. Lovena Le. You will receive a reminder letter in the mail two months in advance. If you don't receive a letter, please call our office to schedule the follow-up appointment.

## 2016-10-01 NOTE — Progress Notes (Signed)
Pacemaker check in clinic. Normal device function. Thresholds, sensing, impedances consistent with previous measurements. Device programmed to maximize longevity. No mode switch counters due to DDIR programming, +Eliquis. 3 high ventricular rates noted--NSVT, longest 8bts. Device programmed at appropriate safety margins. Histogram distribution appropriate for patient activity level. Device programmed to optimize intrinsic conduction. Estimated longevity 8.5 years. Patient declines remote follow-up. Patient education completed. ROV with GT in 04/2017.  One box of Eliquis 2.5mg  samples given to patient per her request.

## 2016-10-15 DIAGNOSIS — J45909 Unspecified asthma, uncomplicated: Secondary | ICD-10-CM | POA: Diagnosis not present

## 2016-10-15 DIAGNOSIS — R7309 Other abnormal glucose: Secondary | ICD-10-CM | POA: Diagnosis not present

## 2016-10-15 DIAGNOSIS — Z0000 Encounter for general adult medical examination without abnormal findings: Secondary | ICD-10-CM | POA: Diagnosis not present

## 2016-10-15 DIAGNOSIS — Z131 Encounter for screening for diabetes mellitus: Secondary | ICD-10-CM | POA: Diagnosis not present

## 2016-10-29 DIAGNOSIS — R7309 Other abnormal glucose: Secondary | ICD-10-CM | POA: Diagnosis not present

## 2016-10-29 DIAGNOSIS — Z0000 Encounter for general adult medical examination without abnormal findings: Secondary | ICD-10-CM | POA: Diagnosis not present

## 2016-10-29 DIAGNOSIS — Z131 Encounter for screening for diabetes mellitus: Secondary | ICD-10-CM | POA: Diagnosis not present

## 2016-10-29 DIAGNOSIS — J45909 Unspecified asthma, uncomplicated: Secondary | ICD-10-CM | POA: Diagnosis not present

## 2016-11-04 ENCOUNTER — Telehealth: Payer: Self-pay | Admitting: Cardiology

## 2016-11-04 NOTE — Telephone Encounter (Signed)
Medication samples have been provided to the patient.  Drug name: Eliquis 2.5  Qty: 28 tablets LOT: [id number]  Exp.Date: 06/19  Samples left at front desk for patient pick-up. Left message to make patient aware.

## 2016-11-04 NOTE — Telephone Encounter (Signed)
New Message  Patient calling the office for samples of medication:   1.  What medication and dosage are you requesting samples for? Eliquis 2.5mg    2.  Are you currently out of this medication? Yes

## 2016-11-05 DIAGNOSIS — M150 Primary generalized (osteo)arthritis: Secondary | ICD-10-CM | POA: Diagnosis not present

## 2016-11-05 DIAGNOSIS — M0589 Other rheumatoid arthritis with rheumatoid factor of multiple sites: Secondary | ICD-10-CM | POA: Diagnosis not present

## 2016-11-05 DIAGNOSIS — Z79899 Other long term (current) drug therapy: Secondary | ICD-10-CM | POA: Diagnosis not present

## 2016-11-22 ENCOUNTER — Other Ambulatory Visit: Payer: Self-pay | Admitting: Cardiology

## 2016-12-04 DIAGNOSIS — R42 Dizziness and giddiness: Secondary | ICD-10-CM | POA: Diagnosis not present

## 2016-12-04 DIAGNOSIS — D649 Anemia, unspecified: Secondary | ICD-10-CM | POA: Diagnosis not present

## 2016-12-04 DIAGNOSIS — R5383 Other fatigue: Secondary | ICD-10-CM | POA: Diagnosis not present

## 2016-12-15 ENCOUNTER — Ambulatory Visit: Payer: Medicare Other | Admitting: Physician Assistant

## 2016-12-16 ENCOUNTER — Telehealth: Payer: Self-pay | Admitting: Physician Assistant

## 2016-12-18 NOTE — Telephone Encounter (Signed)
close encounter °

## 2016-12-19 ENCOUNTER — Ambulatory Visit: Payer: Medicare Other | Admitting: Physician Assistant

## 2016-12-22 DIAGNOSIS — H353132 Nonexudative age-related macular degeneration, bilateral, intermediate dry stage: Secondary | ICD-10-CM | POA: Diagnosis not present

## 2016-12-22 DIAGNOSIS — H5213 Myopia, bilateral: Secondary | ICD-10-CM | POA: Diagnosis not present

## 2016-12-22 DIAGNOSIS — H2511 Age-related nuclear cataract, right eye: Secondary | ICD-10-CM | POA: Diagnosis not present

## 2016-12-26 ENCOUNTER — Telehealth: Payer: Self-pay | Admitting: Cardiology

## 2016-12-26 NOTE — Telephone Encounter (Signed)
Note added to appt note section that patient would like samples. Message to [facility] as Juluis Rainier

## 2016-12-26 NOTE — Telephone Encounter (Signed)
I called patient to confirm her appointment with Dr. Percival Spanish on Tuesday 12/30/16 and she was wondering if we had samples of Eliquis 25 mg.  If we do, can we have them ready when she comes in?

## 2016-12-28 NOTE — Progress Notes (Signed)
HPI  The patient presents for followup of mitral valve replacement. She had a bioprosthetic mitral valve and a Maze procedure in 05/2012.  She's also had pacemaker placement for bradycardia. She's had paroxysmal atrial fibrillation/flutter.  Since I last saw her she has had increased fatigue.  She gets around with a cain.  The patient denies any new symptoms such as chest discomfort, neck or arm discomfort. There has been no new shortness of breath, PND or orthopnea. There have been no reported palpitations, presyncope or syncope.  Allergies  Allergen Reactions  . Promethazine Hcl Other (See Comments)    unknown  . Sulfonamide Derivatives Other (See Comments)    unknown  . Tequin Other (See Comments)    hallucinations     Current Outpatient Prescriptions  Medication Sig Dispense Refill  . albuterol (PROVENTIL HFA;VENTOLIN HFA) 108 (90 Base) MCG/ACT inhaler Inhale 2 puffs into the lungs every 6 (six) hours as needed for wheezing or shortness of breath.    Marland Kitchen amoxicillin (AMOXIL) 500 MG capsule Take 2 grams ( 4 tablets ) 1 hour before dental work 4 capsule 3  . Calcium Carbonate-Vitamin D (CALTRATE 600+D) 600-400 MG-UNIT per tablet Take 1 tablet by mouth daily.     Marland Kitchen ELIQUIS 2.5 MG TABS tablet TAKE 1 TABLET BY MOUTH TWICE A DAY 60 tablet 9  . furosemide (LASIX) 20 MG tablet TAKE 1 TABLET BY MOUTH ON MONDAY, WEDNESDAY, AND FRIDAY ONLY 30 tablet 11  . metoprolol succinate (TOPROL-XL) 25 MG 24 hr tablet TAKE 1 TABLET BY MOUTH TWICE A DAY 180 tablet 2  . montelukast (SINGULAIR) 10 MG tablet Take 10 mg by mouth at bedtime.     . Multiple Vitamins-Minerals (ICAPS AREDS 2 PO) Take 2 tablets by mouth daily.     . nitroGLYCERIN (NITROSTAT) 0.4 MG SL tablet Place 0.4 mg under the tongue every 5 (five) minutes as needed for chest pain (x 3 tabs daily).     No current facility-administered medications for this visit.     Past Medical History:  Diagnosis Date  . Asthma   . Atrial fibrillation  ([place])   . CHF (congestive heart failure) ([place])   . COPD (chronic obstructive pulmonary disease) ([place])   . H/O: hysterectomy   . Hypertension   . LBBB (left bundle branch block)    per discharge note 2003  . Mitral regurgitation    MILD  . MVP (mitral valve prolapse)   . Pacemaker   . Pulmonary hypertension ([place])    MILD TO MODERATE BY ECHO  . PVC (premature ventricular contraction)   . Rheumatoid arthritis(714.0)   . S/P Maze operation for atrial fibrillation 05/18/2012   Complete biatrial lesion set using cryothermy with oversewing of LA appendage  . S/P mitral valve replacement with bioprosthetic valve 05/18/2012   23mm Edwards Magna Mitral pericardial bioprosthesis     Past Surgical History:  Procedure Laterality Date  . ABDOMINAL HYSTERECTOMY    . APPENDECTOMY    . CATARACT EXTRACTION    . CHOLECYSTECTOMY    . hysterectomy    . MAZE  05/18/2012   Procedure: MAZE;  Surgeon: Rexene Alberts, MD;  Location: [place];  Service: Open Heart Surgery;  Laterality: Right;  . MITRAL VALVE REPLACEMENT  05/18/2012   Procedure: MINIMALLY INVASIVE MITRAL VALVE (MV) REPLACEMENT;  Surgeon: Rexene Alberts, MD;  Location: [place];  Service: Open Heart Surgery;  Laterality: N/A;  . OVARIAN CYST REMOVAL    . PACEMAKER INSERTION    .  PERMANENT PACEMAKER INSERTION N/A 05/21/2012   Procedure: PERMANENT PACEMAKER INSERTION;  Surgeon: Evans Lance, MD;  Location: [facility] CATH LAB;  Service: Cardiovascular;  Laterality: N/A;  . TEE WITHOUT CARDIOVERSION  02/09/2012   Procedure: TRANSESOPHAGEAL ECHOCARDIOGRAM (TEE);  Surgeon: Fay Records, MD;  Location: [facility] ENDOSCOPY;  Service: Cardiovascular;  Laterality: N/A;   ROS:  HOH .  Otherwise as stated in the HPI and negative for all other systems.    PHYSICAL EXAM BP 122/74   Pulse 60   Ht 4\' 11"  (1.499 m)   Wt 110 lb (49.9 kg)   BMI 22.22 kg/m   GENERAL:  Well appearing NECK:  Positive jugular venous distention to the jaw with a CV wave. , waveform  within normal limits, carotid upstroke brisk and symmetric, no bruits, no thyromegaly LUNGS:  Clear to auscultation bilaterally BACK:  No CVA tenderness CHEST:  Unremarkable HEART:  PMI not displaced or sustained,S1 and S2 within normal limits, no S3, no S4, no clicks, no rubs, 3/6 apical systolic murmur at the left 3rd intercostal space without diastolic murmurs ABD:  Flat, positive bowel sounds normal in frequency in pitch, no bruits, no rebound, no guarding, no midline pulsatile mass, no hepatomegaly, no splenomegaly EXT:  2 plus pulses throughout, no edema, no cyanosis no clubbing  EKG:  Underlying atrial fib with ventricular pacing 100% rate 60.    ASSESSMENT AND PLAN   MVR:  Her echocardiogram in November of  2016 with a stable bioprosthesis.  However, her exam would suggest increased pulmonary pressure and TR.  I will repeat an echocardiogram.  ATRIAL FIB/FLUTTER:  She is  not having symptomatic arrhythmias but appears to be in fib today. . She tolerates rate control and anticoagulation. Ms. Nancy Bush has a CHA2DS2 - VASc score of 4 with a risk of stroke of 4%.   She will continue on current therapy.  FATIGUE:  I reviewed outside labs.  I do not see a clear etiology and this has been a chronic complaint.  No further work up is planned.   BRADYCARDIA:  History of pacemaker placement.   She is up to date with follow up.  I reviewed the report and tracings from April.

## 2016-12-30 ENCOUNTER — Ambulatory Visit (INDEPENDENT_AMBULATORY_CARE_PROVIDER_SITE_OTHER): Payer: Medicare Other | Admitting: Cardiology

## 2016-12-30 ENCOUNTER — Encounter: Payer: Self-pay | Admitting: Cardiology

## 2016-12-30 VITALS — BP 122/74 | HR 60 | Ht 59.0 in | Wt 110.0 lb

## 2016-12-30 DIAGNOSIS — R5383 Other fatigue: Secondary | ICD-10-CM | POA: Diagnosis not present

## 2016-12-30 DIAGNOSIS — Z953 Presence of xenogenic heart valve: Secondary | ICD-10-CM

## 2016-12-30 DIAGNOSIS — I272 Pulmonary hypertension, unspecified: Secondary | ICD-10-CM | POA: Diagnosis not present

## 2016-12-30 DIAGNOSIS — I480 Paroxysmal atrial fibrillation: Secondary | ICD-10-CM

## 2016-12-30 NOTE — Patient Instructions (Signed)

## 2016-12-30 NOTE — Telephone Encounter (Signed)
Refill samples set aside for pt to pick up at appt today.

## 2017-01-12 ENCOUNTER — Ambulatory Visit (HOSPITAL_COMMUNITY): Payer: Medicare Other | Attending: Cardiology

## 2017-01-12 ENCOUNTER — Other Ambulatory Visit: Payer: Self-pay

## 2017-01-12 DIAGNOSIS — I272 Pulmonary hypertension, unspecified: Secondary | ICD-10-CM | POA: Diagnosis not present

## 2017-01-12 DIAGNOSIS — Z953 Presence of xenogenic heart valve: Secondary | ICD-10-CM | POA: Diagnosis not present

## 2017-01-12 DIAGNOSIS — I081 Rheumatic disorders of both mitral and tricuspid valves: Secondary | ICD-10-CM | POA: Insufficient documentation

## 2017-01-12 DIAGNOSIS — I509 Heart failure, unspecified: Secondary | ICD-10-CM | POA: Insufficient documentation

## 2017-01-12 DIAGNOSIS — I059 Rheumatic mitral valve disease, unspecified: Secondary | ICD-10-CM | POA: Diagnosis present

## 2017-02-03 ENCOUNTER — Other Ambulatory Visit: Payer: Self-pay | Admitting: Cardiology

## 2017-03-06 ENCOUNTER — Telehealth: Payer: Self-pay | Admitting: Cardiology

## 2017-03-06 MED ORDER — AMOXICILLIN 500 MG PO CAPS: ORAL_CAPSULE | ORAL | 6 refills | Status: DC

## 2017-03-06 NOTE — Telephone Encounter (Signed)
1. What dental office are you calling from? Dr. Anna Genre   What is your office phone and fax number? Office # [phone] 2.    3. What type of procedure is the patient having performed? Teeth cleaning  4. What date is procedure scheduled? 03-09-17   5. What is your question (ex. Antibiotics prior to procedure, holding medication-we need to know how long dentist wants pt to hold med)? Patient not sure if she needs to take amoxicillin before cleaning  Patient requesting call back

## 2017-03-06 NOTE — Telephone Encounter (Signed)
Returned call to patient.Advised to take amoxicillin 2 grams 1 hour before dental work.Advised she does not have to hold Elquis before dental cleaning.

## 2017-04-17 ENCOUNTER — Other Ambulatory Visit: Payer: Self-pay | Admitting: Cardiology

## 2017-04-28 ENCOUNTER — Encounter (INDEPENDENT_AMBULATORY_CARE_PROVIDER_SITE_OTHER): Payer: Self-pay

## 2017-04-28 ENCOUNTER — Encounter: Payer: Self-pay | Admitting: Internal Medicine

## 2017-04-28 ENCOUNTER — Ambulatory Visit (INDEPENDENT_AMBULATORY_CARE_PROVIDER_SITE_OTHER): Payer: Medicare Other | Admitting: Internal Medicine

## 2017-04-28 VITALS — BP 130/62 | HR 63 | Ht 59.0 in | Wt 107.0 lb

## 2017-04-28 DIAGNOSIS — I480 Paroxysmal atrial fibrillation: Secondary | ICD-10-CM | POA: Diagnosis not present

## 2017-04-28 DIAGNOSIS — R001 Bradycardia, unspecified: Secondary | ICD-10-CM | POA: Diagnosis not present

## 2017-04-28 LAB — CUP PACEART INCLINIC DEVICE CHECK
Date Time Interrogation Session: 20180925145521
Implantable Lead Implant Date: 20131018
Implantable Lead Location: 753859
Implantable Lead Model: 4135
Implantable Lead Serial Number: 29232066
Implantable Pulse Generator Implant Date: 20131018
Lead Channel Impedance Value: 497 Ohm
Lead Channel Impedance Value: 619 Ohm
Lead Channel Sensing Intrinsic Amplitude: 20.7 mV
Lead Channel Sensing Intrinsic Amplitude: 3.4 mV
Lead Channel Setting Pacing Amplitude: 1.3 V
MDC IDC LEAD IMPLANT DT: 20131018
MDC IDC LEAD LOCATION: 753860
MDC IDC LEAD SERIAL: 29233089
MDC IDC MSMT LEADCHNL RV PACING THRESHOLD AMPLITUDE: 0.7 V
MDC IDC MSMT LEADCHNL RV PACING THRESHOLD PULSEWIDTH: 0.4 ms
MDC IDC SET LEADCHNL RA PACING AMPLITUDE: 2 V
MDC IDC SET LEADCHNL RV PACING PULSEWIDTH: 0.4 ms
MDC IDC SET LEADCHNL RV SENSING SENSITIVITY: 2.5 mV
MDC IDC STAT BRADY RA PERCENT PACED: 1 % — AB
MDC IDC STAT BRADY RV PERCENT PACED: 99 %
Pulse Gen Serial Number: 111608

## 2017-04-28 NOTE — Patient Instructions (Addendum)
Medication Instructions:  Your physician recommends that you continue on your current medications as directed. Please refer to the Current Medication list given to you today.  Labwork: None ordered.  Testing/Procedures: None ordered.  Follow-Up:  You will follow up with the device clinic in 6 months for a device check.    Your physician wants you to follow-up in: one year with Dr. Lovena Le.   You will receive a reminder letter in the mail two months in advance. If you don't receive a letter, please call our office to schedule the follow-up appointment.     Any Other Special Instructions Will Be Listed Below (If Applicable).     If you need a refill on your cardiac medications before your next appointment, please call your pharmacy.

## 2017-04-28 NOTE — Progress Notes (Signed)
HPI Mrs. Assad returns today for ongoing evaluation and management of her [facility] Scientific dual-chamber pacemaker. She is a very pleasant 83 year old woman with a history of complete heart block status post pacemaker insertion. She has persistent atrial flutter. Review of her ECGs demonstrates atypical flutter as well as atrial fibrillation the past. She is asymptomatic with this. She has become less active but she is not quite unsteady on her feet. She has not fallen. She has had no bleeding on oral anticoagulant therapy. She denies chest pain or shortness of breath. Allergies  Allergen Reactions  . Promethazine Hcl Other (See Comments)    unknown  . Sulfonamide Derivatives Other (See Comments)    unknown  . Tequin Other (See Comments)    hallucinations      Current Outpatient Prescriptions  Medication Sig Dispense Refill  . albuterol (PROVENTIL HFA;VENTOLIN HFA) 108 (90 Base) MCG/ACT inhaler Inhale 2 puffs into the lungs every 6 (six) hours as needed for wheezing or shortness of breath.    Marland Kitchen amoxicillin (AMOXIL) 500 MG capsule Take 2 grams ( 4 tablets ) 1 hour before dental work 4 capsule 6  . Calcium Carbonate-Vitamin D (CALTRATE 600+D) 600-400 MG-UNIT per tablet Take 1 tablet by mouth daily.     Marland Kitchen ELIQUIS 2.5 MG TABS tablet TAKE 1 TABLET BY MOUTH TWICE A DAY 60 tablet 5  . furosemide (LASIX) 20 MG tablet TAKE 1 TABLET BY MOUTH ON MONDAY, WEDNESDAY, AND FRIDAY ONLY 15 tablet 6  . metoprolol succinate (TOPROL-XL) 25 MG 24 hr tablet TAKE 1 TABLET BY MOUTH TWICE A DAY 180 tablet 2  . montelukast (SINGULAIR) 10 MG tablet Take 10 mg by mouth at bedtime.     . Multiple Vitamins-Minerals (ICAPS AREDS 2 PO) Take 2 tablets by mouth daily.     . nitroGLYCERIN (NITROSTAT) 0.4 MG SL tablet Place 0.4 mg under the tongue every 5 (five) minutes as needed for chest pain (x 3 tabs daily).     No current facility-administered medications for this visit.      Past Medical History:    Diagnosis Date  . Asthma   . Atrial fibrillation ([place])   . CHF (congestive heart failure) ([place])   . COPD (chronic obstructive pulmonary disease) ([place])   . H/O: hysterectomy   . Hypertension   . LBBB (left bundle branch block)    per discharge note 2003  . Mitral regurgitation    MILD  . MVP (mitral valve prolapse)   . Pacemaker   . Pulmonary hypertension ([place])    MILD TO MODERATE BY ECHO  . PVC (premature ventricular contraction)   . Rheumatoid arthritis(714.0)   . S/P Maze operation for atrial fibrillation 05/18/2012   Complete biatrial lesion set using cryothermy with oversewing of LA appendage  . S/P mitral valve replacement with bioprosthetic valve 05/18/2012   10mm Edwards [facility] Mitral pericardial bioprosthesis     ROS:   All systems reviewed and negative except as noted in the HPI.   Past Surgical History:  Procedure Laterality Date  . ABDOMINAL HYSTERECTOMY    . APPENDECTOMY    . CATARACT EXTRACTION    . CHOLECYSTECTOMY    . hysterectomy    . MAZE  05/18/2012   Procedure: MAZE;  Surgeon: Rexene Alberts, MD;  Location: [place];  Service: Open Heart Surgery;  Laterality: Right;  . MITRAL VALVE REPLACEMENT  05/18/2012   Procedure: MINIMALLY INVASIVE MITRAL VALVE (MV) REPLACEMENT;  Surgeon: Rexene Alberts,  MD;  Location: [facility];  Service: Open Heart Surgery;  Laterality: N/A;  . OVARIAN CYST REMOVAL    . PACEMAKER INSERTION    . PERMANENT PACEMAKER INSERTION N/A 05/21/2012   Procedure: PERMANENT PACEMAKER INSERTION;  Surgeon: Evans Lance, MD;  Location: [facility] CATH LAB;  Service: Cardiovascular;  Laterality: N/A;  . TEE WITHOUT CARDIOVERSION  02/09/2012   Procedure: TRANSESOPHAGEAL ECHOCARDIOGRAM (TEE);  Surgeon: Fay Records, MD;  Location: [facility] ENDOSCOPY;  Service: Cardiovascular;  Laterality: N/A;     No family history on file.   Social History   Social History  . Marital status: Single    Spouse name: N/A  . Number of children: N/A  . Years of education:  N/A   Occupational History  . Not on file.   Social History Main Topics  . Smoking status: Never Smoker  . Smokeless tobacco: Never Used  . Alcohol use No  . Drug use: No  . Sexual activity: Not Currently    Birth control/ protection: Post-menopausal   Other Topics Concern  . Not on file   Social History Narrative  . No narrative on file     BP 130/62   Pulse 63   Ht 4\' 11"  (1.499 m)   Wt 107 lb (48.5 kg)   SpO2 97%   BMI 21.61 kg/m   Physical Exam:  Well appearing 83 year old man, NAD HEENT: Unremarkable Neck:  6 cm JVD, no thyromegally Lymphatics:  No adenopathy Back:  No CVA tenderness Lungs:  Clear, with no wheezes, rales, or rhonchi. Well-healed pacemaker incision. HEART:  Regular rate rhythm, no murmurs, no rubs, no clicks Abd:  soft, positive bowel sounds, no organomegally, no rebound, no guarding Ext:  2 plus pulses, no edema, no cyanosis, no clubbing Skin:  No rashes no nodules Neuro:  CN II through XII intact, motor grossly intact   DEVICE  Normal device function.  See PaceArt for details.   Assess/Plan: 1. Complete heart block - she is asymptomatic status post pacemaker insertion. 2. Pacemaker - her [place] pacemaker is working normally. 3. Atrial fibrillation and flutter - her ventricular rate is well controlled. She is tolerating systemic anticoagulation.  Cristopher Peru, M.D.

## 2017-05-07 DIAGNOSIS — Z23 Encounter for immunization: Secondary | ICD-10-CM | POA: Diagnosis not present

## 2017-06-04 ENCOUNTER — Other Ambulatory Visit: Payer: Self-pay | Admitting: Cardiology

## 2017-06-04 NOTE — Telephone Encounter (Signed)
Rx has been sent to the pharmacy electronically. ° °

## 2017-06-15 DIAGNOSIS — R300 Dysuria: Secondary | ICD-10-CM | POA: Diagnosis not present

## 2017-06-15 DIAGNOSIS — R319 Hematuria, unspecified: Secondary | ICD-10-CM | POA: Diagnosis not present

## 2017-06-15 DIAGNOSIS — N390 Urinary tract infection, site not specified: Secondary | ICD-10-CM | POA: Diagnosis not present

## 2017-07-02 DIAGNOSIS — N390 Urinary tract infection, site not specified: Secondary | ICD-10-CM | POA: Diagnosis not present

## 2017-09-10 ENCOUNTER — Other Ambulatory Visit: Payer: Self-pay | Admitting: Cardiology

## 2017-09-18 ENCOUNTER — Other Ambulatory Visit: Payer: Self-pay | Admitting: *Deleted

## 2017-09-18 MED ORDER — METOPROLOL SUCCINATE ER 25 MG PO TB24: 25.0000 mg | ORAL_TABLET | Freq: Two times a day (BID) | ORAL | 1 refills | Status: DC

## 2017-11-02 DIAGNOSIS — M069 Rheumatoid arthritis, unspecified: Secondary | ICD-10-CM | POA: Diagnosis not present

## 2017-11-02 DIAGNOSIS — Z23 Encounter for immunization: Secondary | ICD-10-CM | POA: Diagnosis not present

## 2017-11-02 DIAGNOSIS — Z1389 Encounter for screening for other disorder: Secondary | ICD-10-CM | POA: Diagnosis not present

## 2017-11-02 DIAGNOSIS — I4891 Unspecified atrial fibrillation: Secondary | ICD-10-CM | POA: Diagnosis not present

## 2017-11-02 DIAGNOSIS — Z0000 Encounter for general adult medical examination without abnormal findings: Secondary | ICD-10-CM | POA: Diagnosis not present

## 2017-11-02 DIAGNOSIS — J45909 Unspecified asthma, uncomplicated: Secondary | ICD-10-CM | POA: Diagnosis not present

## 2017-11-05 ENCOUNTER — Other Ambulatory Visit: Payer: Self-pay | Admitting: Cardiology

## 2017-11-12 ENCOUNTER — Ambulatory Visit (INDEPENDENT_AMBULATORY_CARE_PROVIDER_SITE_OTHER): Payer: Medicare Other | Admitting: *Deleted

## 2017-11-12 DIAGNOSIS — I480 Paroxysmal atrial fibrillation: Secondary | ICD-10-CM | POA: Diagnosis not present

## 2017-11-12 DIAGNOSIS — R001 Bradycardia, unspecified: Secondary | ICD-10-CM | POA: Diagnosis not present

## 2017-11-12 LAB — CUP PACEART INCLINIC DEVICE CHECK
Date Time Interrogation Session: 20190411040000
Implantable Lead Implant Date: 20131018
Implantable Lead Location: 753860
Implantable Lead Model: 4135
Implantable Lead Serial Number: 29232066
Implantable Pulse Generator Implant Date: 20131018
Lead Channel Impedance Value: 523 Ohm
Lead Channel Impedance Value: 603 Ohm
Lead Channel Pacing Threshold Amplitude: 0.7 V
Lead Channel Pacing Threshold Pulse Width: 0.4 ms
Lead Channel Sensing Intrinsic Amplitude: 2.9 mV
MDC IDC LEAD IMPLANT DT: 20131018
MDC IDC LEAD LOCATION: 753859
MDC IDC LEAD SERIAL: 29233089
MDC IDC PG SERIAL: 111608
MDC IDC SET LEADCHNL RA PACING AMPLITUDE: 2 V
MDC IDC SET LEADCHNL RV PACING AMPLITUDE: 1.2 V
MDC IDC SET LEADCHNL RV PACING PULSEWIDTH: 0.4 ms
MDC IDC SET LEADCHNL RV SENSING SENSITIVITY: 2.5 mV
MDC IDC STAT BRADY RA PERCENT PACED: 10 %
MDC IDC STAT BRADY RV PERCENT PACED: 97 %

## 2017-11-12 NOTE — Progress Notes (Signed)
Pacemaker check in clinic. Normal device function. Threshold, sensing, impedances consistent with previous measurements. Device programmed to maximize longevity. Known PAF- on Eliquis, no burden as the device is programmed in a non-tracking mode. No high ventricular rates noted. Device programmed at appropriate safety margins. Histogram distribution appropriate for patient activity level. Device programmed to optimize intrinsic conduction. Estimated longevity 6 years. Patient declines remote follow-up. ROV with GT in September.

## 2017-11-19 DIAGNOSIS — M795 Residual foreign body in soft tissue: Secondary | ICD-10-CM | POA: Diagnosis not present

## 2017-11-26 DIAGNOSIS — S70361S Insect bite (nonvenomous), right thigh, sequela: Secondary | ICD-10-CM | POA: Diagnosis not present

## 2017-12-10 NOTE — Progress Notes (Signed)
HPI  The patient presents for followup of mitral valve replacement. She had a bioprosthetic mitral valve and a Maze procedure in 05/2012.  She's also had pacemaker placement for bradycardia. She's had paroxysmal atrial fibrillation/flutter.  On echo last June she had a normal MVR.  She did have moderately elevated pulmonary pressures.     Since I last saw her her big complaint has been fatigue.  She denies any acute cardiovascular symptoms.  She gets around slowly with a cane.  She is able to live by herself and do her household chores.  With this she denies any chest pressure, neck or arm discomfort.  She is had no new shortness of breath, PND or orthopnea.  She will once in a while notice palpitations but has had no presyncope or syncope.  Allergies  Allergen Reactions  . Promethazine Hcl Other (See Comments)    unknown  . Sulfonamide Derivatives Other (See Comments)    unknown  . Tequin Other (See Comments)    hallucinations     Current Outpatient Medications  Medication Sig Dispense Refill  . albuterol (PROVENTIL HFA;VENTOLIN HFA) 108 (90 Base) MCG/ACT inhaler Inhale 2 puffs into the lungs every 6 (six) hours as needed for wheezing or shortness of breath.    Marland Kitchen amoxicillin (AMOXIL) 500 MG capsule Take 2 grams ( 4 tablets ) 1 hour before dental work 4 capsule 6  . amoxicillin (AMOXIL) 500 MG capsule TAKE 2 GRAMS ( 4 TABLETS ) 1 HOUR BEFORE DENTAL WORK 4 capsule 1  . Calcium Carbonate-Vitamin D (CALTRATE 600+D) 600-400 MG-UNIT per tablet Take 1 tablet by mouth daily.     Marland Kitchen ELIQUIS 2.5 MG TABS tablet TAKE 1 TABLET BY MOUTH TWICE A DAY 180 tablet 1  . furosemide (LASIX) 20 MG tablet TAKE 1 TABLET BY MOUTH ON MONDAY, WEDNESDAY, AND FRIDAY ONLY 15 tablet 6  . metoprolol succinate (TOPROL-XL) 25 MG 24 hr tablet Take 1 tablet (25 mg total) by mouth 2 (two) times daily. 180 tablet 1  . montelukast (SINGULAIR) 10 MG tablet Take 10 mg by mouth at bedtime.     . Multiple Vitamins-Minerals  (ICAPS AREDS 2 PO) Take 2 tablets by mouth daily.     . nitroGLYCERIN (NITROSTAT) 0.4 MG SL tablet Place 0.4 mg under the tongue every 5 (five) minutes as needed for chest pain (x 3 tabs daily).    . predniSONE (DELTASONE) 5 MG tablet Take 5 mg by mouth every other day. 1/2 tab by mouth every other day     No current facility-administered medications for this visit.     Past Medical History:  Diagnosis Date  . Asthma   . Atrial fibrillation ([place])   . CHF (congestive heart failure) ([place])   . COPD (chronic obstructive pulmonary disease) ([place])   . H/O: hysterectomy   . Hypertension   . LBBB (left bundle branch block)    per discharge note 2003  . Mitral regurgitation    MILD  . MVP (mitral valve prolapse)   . Pacemaker   . Pulmonary hypertension ([place])    MILD TO MODERATE BY ECHO  . PVC (premature ventricular contraction)   . Rheumatoid arthritis(714.0)   . S/P Maze operation for atrial fibrillation 05/18/2012   Complete biatrial lesion set using cryothermy with oversewing of LA appendage  . S/P mitral valve replacement with bioprosthetic valve 05/18/2012   75mm Edwards [employer] Mitral pericardial bioprosthesis     Past Surgical History:  Procedure Laterality Date  .  ABDOMINAL HYSTERECTOMY    . APPENDECTOMY    . CATARACT EXTRACTION    . CHOLECYSTECTOMY    . hysterectomy    . MAZE  05/18/2012   Procedure: MAZE;  Surgeon: Rexene Alberts, MD;  Location: [place];  Service: Open Heart Surgery;  Laterality: Right;  . MITRAL VALVE REPLACEMENT  05/18/2012   Procedure: MINIMALLY INVASIVE MITRAL VALVE (MV) REPLACEMENT;  Surgeon: Rexene Alberts, MD;  Location: [place];  Service: Open Heart Surgery;  Laterality: N/A;  . OVARIAN CYST REMOVAL    . PACEMAKER INSERTION    . PERMANENT PACEMAKER INSERTION N/A 05/21/2012   Procedure: PERMANENT PACEMAKER INSERTION;  Surgeon: Evans Lance, MD;  Location: [facility] CATH LAB;  Service: Cardiovascular;  Laterality: N/A;  . TEE WITHOUT CARDIOVERSION  02/09/2012    Procedure: TRANSESOPHAGEAL ECHOCARDIOGRAM (TEE);  Surgeon: Fay Records, MD;  Location: [facility] ENDOSCOPY;  Service: Cardiovascular;  Laterality: N/A;   ROS:  Decreased hearing.   Otherwise as stated in the HPI and negative for all other systems.  PHYSICAL EXAM BP (!) 152/62   Pulse 60   Ht 4\' 11"  (1.499 m)   Wt 102 lb 6.4 oz (46.4 kg)   BMI 20.68 kg/m   GENERAL: Frail appearing.  NECK:  Positive  jugular venous distention, CV wave, waveform within normal limits, carotid upstroke brisk and symmetric, no bruits, no thyromegaly LUNGS:  Clear to auscultation bilaterally CHEST:  Well healed pacer pocket.  HEART:  PMI not displaced or sustained,S1 and S2 within normal limits, no S3, no S4, no clicks, no rubs, 2 out of 6 holosystolic murmur murmurs at the lower left sternal border, no diastolic murmurs. ABD:  Flat, positive bowel sounds normal in frequency in pitch, no bruits, no rebound, no guarding, no midline pulsatile mass, no hepatomegaly, no splenomegaly EXT:  2 plus pulses throughout, no edema, no cyanosis no clubbing   EKG:  Underlying atrial fib with ventricular pacing 100% rate 60.    ASSESSMENT AND PLAN   MVR:  Her echocardiogram in June 2018 demonstrated stable bioprosthesis.  No further imaging at this point.   ATRIAL FIB/FLUTTER:  She is  not having symptomatic arrhythmias but appears to be in fib today.  She tolerates rate control and anticoagulation. Ms. RHEBA DIAMOND has a CHA2DS2 - VASc score of 4 with a risk of stroke of 4%.   No change in therapy.   FATIGUE:   This has been chronic.  I will check  CBC and TSH  BRADYCARDIA:  History of pacemaker placement.   She is up to date with follow up.  I reviewed a recent note from Dr. Lovena Le.    TR:  She had moderately elevated pulmonary pressures.  However, I think this is best managed medically.  She has no overt symptoms and is otherwise frail.  No change in therapy.      HTN:  BP at home is well controlled.  No change  in therapy.

## 2017-12-11 ENCOUNTER — Encounter: Payer: Self-pay | Admitting: Cardiology

## 2017-12-11 ENCOUNTER — Ambulatory Visit (INDEPENDENT_AMBULATORY_CARE_PROVIDER_SITE_OTHER): Payer: Medicare Other | Admitting: Cardiology

## 2017-12-11 VITALS — BP 152/62 | HR 60 | Ht 59.0 in | Wt 102.4 lb

## 2017-12-11 DIAGNOSIS — I10 Essential (primary) hypertension: Secondary | ICD-10-CM | POA: Insufficient documentation

## 2017-12-11 DIAGNOSIS — R5383 Other fatigue: Secondary | ICD-10-CM

## 2017-12-11 DIAGNOSIS — Z953 Presence of xenogenic heart valve: Secondary | ICD-10-CM | POA: Diagnosis not present

## 2017-12-11 DIAGNOSIS — Z79899 Other long term (current) drug therapy: Secondary | ICD-10-CM | POA: Diagnosis not present

## 2017-12-11 DIAGNOSIS — I272 Pulmonary hypertension, unspecified: Secondary | ICD-10-CM

## 2017-12-11 NOTE — Patient Instructions (Signed)
Medication Instructions:  Continue current medications  If you need a refill on your cardiac medications before your next appointment, please call your pharmacy.  Labwork: CBC and TSH HERE IN OUR OFFICE AT [facility]  Take the provided lab slips with you to the lab for your blood draw.   You will NOT need to fast   Testing/Procedures: None Ordered   Follow-Up: Your physician wants you to follow-up in: 1 Year. You should receive a reminder letter in the mail two months in advance. If you do not receive a letter, please call our office [phone].      Thank you for choosing [facility] at [facility]!!

## 2017-12-12 LAB — CBC
HEMOGLOBIN: 11.8 g/dL (ref 11.1–15.9)
Hematocrit: 35.6 % (ref 34.0–46.6)
MCH: 30.6 pg (ref 26.6–33.0)
MCHC: 33.1 g/dL (ref 31.5–35.7)
MCV: 93 fL (ref 79–97)
PLATELETS: 134 10*3/uL — AB (ref 150–379)
RBC: 3.85 x10E6/uL (ref 3.77–5.28)
RDW: 13.4 % (ref 12.3–15.4)
WBC: 4.8 10*3/uL (ref 3.4–10.8)

## 2017-12-12 LAB — TSH: TSH: 8.81 u[IU]/mL — ABNORMAL HIGH (ref 0.450–4.500)

## 2017-12-17 DIAGNOSIS — M0589 Other rheumatoid arthritis with rheumatoid factor of multiple sites: Secondary | ICD-10-CM | POA: Diagnosis not present

## 2017-12-17 DIAGNOSIS — Z79899 Other long term (current) drug therapy: Secondary | ICD-10-CM | POA: Diagnosis not present

## 2017-12-17 DIAGNOSIS — M150 Primary generalized (osteo)arthritis: Secondary | ICD-10-CM | POA: Diagnosis not present

## 2017-12-22 ENCOUNTER — Telehealth: Payer: Self-pay | Admitting: *Deleted

## 2017-12-22 DIAGNOSIS — Z79899 Other long term (current) drug therapy: Secondary | ICD-10-CM

## 2017-12-22 DIAGNOSIS — R946 Abnormal results of thyroid function studies: Secondary | ICD-10-CM

## 2017-12-22 NOTE — Telephone Encounter (Signed)
Pt aware of her blood work, T3 and T4 ordered for pt to get done tomorrow.Marland KitchenMarland Kitchen

## 2017-12-22 NOTE — Telephone Encounter (Signed)
-----   Message from Minus Breeding, MD sent at 12/20/2017  3:12 PM EDT ----- Please draw T3 and T4.  TSH is slightly elevated.  Call Ms. Varnum with the results and send results to Lawerance Cruel, MD

## 2017-12-23 DIAGNOSIS — R946 Abnormal results of thyroid function studies: Secondary | ICD-10-CM | POA: Diagnosis not present

## 2017-12-23 DIAGNOSIS — Z79899 Other long term (current) drug therapy: Secondary | ICD-10-CM | POA: Diagnosis not present

## 2017-12-24 LAB — T3: T3, Total: 96 ng/dL (ref 71–180)

## 2017-12-24 LAB — T4, FREE: FREE T4: 1.14 ng/dL (ref 0.82–1.77)

## 2018-01-04 DIAGNOSIS — H353131 Nonexudative age-related macular degeneration, bilateral, early dry stage: Secondary | ICD-10-CM | POA: Diagnosis not present

## 2018-01-04 DIAGNOSIS — D3132 Benign neoplasm of left choroid: Secondary | ICD-10-CM | POA: Diagnosis not present

## 2018-01-04 DIAGNOSIS — H5213 Myopia, bilateral: Secondary | ICD-10-CM | POA: Diagnosis not present

## 2018-01-04 DIAGNOSIS — H2511 Age-related nuclear cataract, right eye: Secondary | ICD-10-CM | POA: Diagnosis not present

## 2018-01-15 ENCOUNTER — Other Ambulatory Visit: Payer: Self-pay | Admitting: Cardiology

## 2018-01-15 NOTE — Telephone Encounter (Signed)
Rx request sent to pharmacy.  

## 2018-02-11 ENCOUNTER — Telehealth: Payer: Self-pay | Admitting: Cardiology

## 2018-02-11 NOTE — Telephone Encounter (Signed)
Called patient, she stated that she was given 5mg  tablets of the eliquis and wanted to return them. I advised that we did not accept samples back in the office from patients. Patient verbalized understanding, she did mention trying to see Dr.Hochrien in the office, patient denies chest pains, SOB, swelling in hands/feet. Patient states that she feels fatigue and has no energy. I looked over recent labs, noticed TSH was elevated. Advised patient to contact PCP office to discuss that issue, advised if she began having chest pains to contact our office. Patient understood, had no questions or concerns.

## 2018-02-11 NOTE — Telephone Encounter (Signed)
Attempted to contact patient regarding the samples. Phone rang x7 with no answer and no voicemail to leave a message.   We currently did not have any 2.5 Eliquis samples.

## 2018-02-11 NOTE — Telephone Encounter (Signed)
Patient calling the office for samples of medication: ° ° °1.  What medication and dosage are you requesting samples for? Eliquis ° °2.  Are you currently out of this medication?  A few ° ° ° °

## 2018-02-16 DIAGNOSIS — Z862 Personal history of diseases of the blood and blood-forming organs and certain disorders involving the immune mechanism: Secondary | ICD-10-CM | POA: Diagnosis not present

## 2018-02-16 DIAGNOSIS — R5381 Other malaise: Secondary | ICD-10-CM | POA: Diagnosis not present

## 2018-02-17 ENCOUNTER — Telehealth: Payer: Self-pay | Admitting: Cardiology

## 2018-02-17 NOTE — Telephone Encounter (Signed)
Patient called back and was notified to pick up samples at front desk.

## 2018-02-17 NOTE — Telephone Encounter (Signed)
Attempted to contact patient to notify her that we had samples of the 2.5 eliquis.   No answer, did not have a voicemail set up.   Medication given: Eliquis 2.5 Lot #: [id number] Expiration: July 2020 Qty: 2 boxes (28 days worth)

## 2018-02-17 NOTE — Telephone Encounter (Signed)
New Messages    Patient calling the office for samples of medication:   1.  What medication and dosage are you requesting samples for? ELIQUIS 2.5 MG TABS tablet  2.  Are you currently out of this medication? yes

## 2018-03-09 DIAGNOSIS — Z79899 Other long term (current) drug therapy: Secondary | ICD-10-CM | POA: Diagnosis not present

## 2018-03-09 DIAGNOSIS — M150 Primary generalized (osteo)arthritis: Secondary | ICD-10-CM | POA: Diagnosis not present

## 2018-03-09 DIAGNOSIS — M0589 Other rheumatoid arthritis with rheumatoid factor of multiple sites: Secondary | ICD-10-CM | POA: Diagnosis not present

## 2018-03-25 DIAGNOSIS — R634 Abnormal weight loss: Secondary | ICD-10-CM | POA: Diagnosis not present

## 2018-03-29 ENCOUNTER — Other Ambulatory Visit: Payer: Self-pay | Admitting: Family Medicine

## 2018-03-29 DIAGNOSIS — R634 Abnormal weight loss: Secondary | ICD-10-CM

## 2018-04-01 ENCOUNTER — Other Ambulatory Visit: Payer: Self-pay | Admitting: Cardiology

## 2018-04-01 NOTE — Telephone Encounter (Signed)
Rx sent to pharmacy   

## 2018-04-08 ENCOUNTER — Ambulatory Visit
Admission: RE | Admit: 2018-04-08 | Discharge: 2018-04-08 | Disposition: A | Discharge status: Home / Self Care | Payer: Medicare Other | Source: Ambulatory Visit | Attending: Family Medicine | Admitting: Family Medicine

## 2018-04-08 DIAGNOSIS — R634 Abnormal weight loss: Secondary | ICD-10-CM | POA: Diagnosis not present

## 2018-04-08 MED ORDER — IOPAMIDOL (ISOVUE-300) INJECTION 61%
100.0000 mL | Freq: Once | INTRAVENOUS | Status: AC | PRN
  Administered 2018-04-08: 80 mL via INTRAVENOUS

## 2018-05-03 ENCOUNTER — Encounter: Payer: Medicare Other | Admitting: Internal Medicine

## 2018-05-03 DIAGNOSIS — R935 Abnormal findings on diagnostic imaging of other abdominal regions, including retroperitoneum: Secondary | ICD-10-CM | POA: Diagnosis not present

## 2018-05-17 DIAGNOSIS — R933 Abnormal findings on diagnostic imaging of other parts of digestive tract: Secondary | ICD-10-CM | POA: Diagnosis not present

## 2018-05-18 ENCOUNTER — Emergency Department (HOSPITAL_COMMUNITY)
Admission: EM | Admit: 2018-05-18 | Discharge: 2018-05-18 | Disposition: A | Discharge status: Home / Self Care | Payer: Medicare Other | Attending: Emergency Medicine | Admitting: Emergency Medicine

## 2018-05-18 ENCOUNTER — Ambulatory Visit: Payer: Medicare Other | Admitting: Adult Health

## 2018-05-18 ENCOUNTER — Encounter (HOSPITAL_COMMUNITY): Payer: Self-pay | Admitting: Emergency Medicine

## 2018-05-18 ENCOUNTER — Emergency Department (HOSPITAL_COMMUNITY): Payer: Medicare Other

## 2018-05-18 ENCOUNTER — Telehealth: Payer: Self-pay | Admitting: Cardiology

## 2018-05-18 DIAGNOSIS — I5033 Acute on chronic diastolic (congestive) heart failure: Secondary | ICD-10-CM | POA: Diagnosis not present

## 2018-05-18 DIAGNOSIS — I509 Heart failure, unspecified: Secondary | ICD-10-CM | POA: Diagnosis not present

## 2018-05-18 DIAGNOSIS — N183 Chronic kidney disease, stage 3 (moderate): Secondary | ICD-10-CM | POA: Diagnosis not present

## 2018-05-18 DIAGNOSIS — J449 Chronic obstructive pulmonary disease, unspecified: Secondary | ICD-10-CM | POA: Diagnosis not present

## 2018-05-18 DIAGNOSIS — Z79899 Other long term (current) drug therapy: Secondary | ICD-10-CM | POA: Diagnosis not present

## 2018-05-18 DIAGNOSIS — I130 Hypertensive heart and chronic kidney disease with heart failure and stage 1 through stage 4 chronic kidney disease, or unspecified chronic kidney disease: Secondary | ICD-10-CM | POA: Diagnosis not present

## 2018-05-18 DIAGNOSIS — R0602 Shortness of breath: Secondary | ICD-10-CM | POA: Diagnosis not present

## 2018-05-18 DIAGNOSIS — J90 Pleural effusion, not elsewhere classified: Secondary | ICD-10-CM | POA: Diagnosis not present

## 2018-05-18 DIAGNOSIS — I110 Hypertensive heart disease with heart failure: Secondary | ICD-10-CM | POA: Diagnosis not present

## 2018-05-18 DIAGNOSIS — R0902 Hypoxemia: Secondary | ICD-10-CM | POA: Diagnosis not present

## 2018-05-18 LAB — COMPREHENSIVE METABOLIC PANEL
ALBUMIN: 3.7 g/dL (ref 3.5–5.0)
ALT: 11 U/L (ref 0–44)
AST: 25 U/L (ref 15–41)
Alkaline Phosphatase: 54 U/L (ref 38–126)
Anion gap: 8 (ref 5–15)
BUN: 11 mg/dL (ref 8–23)
CHLORIDE: 105 mmol/L (ref 98–111)
CO2: 23 mmol/L (ref 22–32)
Calcium: 8.3 mg/dL — ABNORMAL LOW (ref 8.9–10.3)
Creatinine, Ser: 1.04 mg/dL — ABNORMAL HIGH (ref 0.44–1.00)
GFR calc Af Amer: 54 mL/min — ABNORMAL LOW (ref >60)
GFR calc non Af Amer: 46 mL/min — ABNORMAL LOW (ref >60)
GLUCOSE: 103 mg/dL — AB (ref 70–99)
POTASSIUM: 3.6 mmol/L (ref 3.5–5.1)
Sodium: 136 mmol/L (ref 135–145)
Total Bilirubin: 0.9 mg/dL (ref 0.3–1.2)
Total Protein: 6.4 g/dL — ABNORMAL LOW (ref 6.5–8.1)

## 2018-05-18 LAB — BRAIN NATRIURETIC PEPTIDE: B NATRIURETIC PEPTIDE 5: 508 pg/mL — AB (ref 0.0–100.0)

## 2018-05-18 LAB — CBC WITH DIFFERENTIAL/PLATELET
ABS IMMATURE GRANULOCYTES: 0 10*3/uL (ref 0.00–0.07)
BASOS ABS: 0 10*3/uL (ref 0.0–0.1)
BASOS PCT: 0 %
Eosinophils Absolute: 0.1 10*3/uL (ref 0.0–0.5)
Eosinophils Relative: 2 %
HCT: 33.3 % — ABNORMAL LOW (ref 36.0–46.0)
Hemoglobin: 10.3 g/dL — ABNORMAL LOW (ref 12.0–15.0)
IMMATURE GRANULOCYTES: 0 %
Lymphocytes Relative: 23 %
Lymphs Abs: 0.7 10*3/uL (ref 0.7–4.0)
MCH: 29.1 pg (ref 26.0–34.0)
MCHC: 30.9 g/dL (ref 30.0–36.0)
MCV: 94.1 fL (ref 80.0–100.0)
MONOS PCT: 22 %
Monocytes Absolute: 0.7 10*3/uL (ref 0.1–1.0)
NEUTROS ABS: 1.7 10*3/uL (ref 1.7–7.7)
NEUTROS PCT: 53 %
NRBC: 0 % (ref 0.0–0.2)
PLATELETS: 83 10*3/uL — AB (ref 150–400)
RBC: 3.54 MIL/uL — ABNORMAL LOW (ref 3.87–5.11)
RDW: 14.5 % (ref 11.5–15.5)
WBC: 3.2 10*3/uL — ABNORMAL LOW (ref 4.0–10.5)

## 2018-05-18 LAB — I-STAT TROPONIN, ED: Troponin i, poc: 0.01 ng/mL (ref 0.00–0.08)

## 2018-05-18 LAB — MAGNESIUM: MAGNESIUM: 2 mg/dL (ref 1.7–2.4)

## 2018-05-18 MED ORDER — FUROSEMIDE 10 MG/ML IJ SOLN
20.0000 mg | Freq: Once | INTRAMUSCULAR | Status: AC
  Administered 2018-05-18: 20 mg via INTRAVENOUS
  Filled 2018-05-18: qty 2

## 2018-05-18 NOTE — Discharge Instructions (Addendum)
Please take lasik daily for the next 5 days and follow up with cardiology.

## 2018-05-18 NOTE — Telephone Encounter (Signed)
New Message:      Pt c/o Shortness Of Breath: STAT if SOB developed within the last 24 hours or pt is noticeably SOB on the phone  1. Are you currently SOB (can you hear that pt is SOB on the phone)? No  2. How long have you been experiencing SOB? Pt is unsure  3. Are you SOB when sitting or when up moving around? Moving around  4. Are you currently experiencing any other symptoms? Pt states she sometimes feels weak and a little shaky

## 2018-05-18 NOTE — ED Notes (Signed)
ED Provider at bedside. 

## 2018-05-18 NOTE — ED Notes (Signed)
Got patient undress on the monitor did ekg shown to er doctor patient is resting with call bell in reach 

## 2018-05-18 NOTE — Progress Notes (Deleted)
Cardiology Office Note   Date:  05/18/2018   ID:  Nancy Bush, DOB [birth year], MRN [id number]  PCP:  Lawerance Cruel, MD  Cardiologist:  Hochrein  No chief complaint on file.    History of Present Illness: Nancy Bush is a 84 y.o. female who presents for ongoing assessment and management of mitral valve disease and Atrial flutter, with history of MVR with bioprosthetic valve and MAZE procedure in 2013. She has a PPM in situ placed 05/2012 due to bradycardia. She was last seen in the office on 12/11/2017 and had complaints of fatigue, without associated chest pain or dyspnea. EKG on that office visit she was found to be in Atrial fib. She was continued on rate control and anticoagulation with CHADS VASC Score of 4.  No further testing was planned.   She called our office today with complaints of worsening dyspnea, especially with activity.   Past Medical History:  Diagnosis Date  . Asthma   . Atrial fibrillation ([place])   . CHF (congestive heart failure) ([place])   . COPD (chronic obstructive pulmonary disease) ([place])   . H/O: hysterectomy   . Hypertension   . LBBB (left bundle branch block)    per discharge note 2003  . Mitral regurgitation    MILD  . MVP (mitral valve prolapse)   . Pacemaker   . Pulmonary hypertension ([place])    MILD TO MODERATE BY ECHO  . PVC (premature ventricular contraction)   . Rheumatoid arthritis(714.0)   . S/P Maze operation for atrial fibrillation 05/18/2012   Complete biatrial lesion set using cryothermy with oversewing of LA appendage  . S/P mitral valve replacement with bioprosthetic valve 05/18/2012   16mm Edwards Magna Mitral pericardial bioprosthesis     Past Surgical History:  Procedure Laterality Date  . ABDOMINAL HYSTERECTOMY    . APPENDECTOMY    . CATARACT EXTRACTION    . CHOLECYSTECTOMY    . hysterectomy    . MAZE  05/18/2012   Procedure: MAZE;  Surgeon: Rexene Alberts, MD;  Location: [place];  Service: Open Heart Surgery;   Laterality: Right;  . MITRAL VALVE REPLACEMENT  05/18/2012   Procedure: MINIMALLY INVASIVE MITRAL VALVE (MV) REPLACEMENT;  Surgeon: Rexene Alberts, MD;  Location: [place];  Service: Open Heart Surgery;  Laterality: N/A;  . OVARIAN CYST REMOVAL    . PACEMAKER INSERTION    . PERMANENT PACEMAKER INSERTION N/A 05/21/2012   Procedure: PERMANENT PACEMAKER INSERTION;  Surgeon: Evans Lance, MD;  Location: [facility] CATH LAB;  Service: Cardiovascular;  Laterality: N/A;  . TEE WITHOUT CARDIOVERSION  02/09/2012   Procedure: TRANSESOPHAGEAL ECHOCARDIOGRAM (TEE);  Surgeon: Fay Records, MD;  Location: [facility] ENDOSCOPY;  Service: Cardiovascular;  Laterality: N/A;     Current Outpatient Medications  Medication Sig Dispense Refill  . albuterol (PROVENTIL HFA;VENTOLIN HFA) 108 (90 Base) MCG/ACT inhaler Inhale 2 puffs into the lungs every 6 (six) hours as needed for wheezing or shortness of breath.    Marland Kitchen amoxicillin (AMOXIL) 500 MG capsule Take 2 grams ( 4 tablets ) 1 hour before dental work 4 capsule 6  . amoxicillin (AMOXIL) 500 MG capsule TAKE 2 GRAMS ( 4 TABLETS ) 1 HOUR BEFORE DENTAL WORK 4 capsule 1  . Calcium Carbonate-Vitamin D (CALTRATE 600+D) 600-400 MG-UNIT per tablet Take 1 tablet by mouth daily.     Marland Kitchen ELIQUIS 2.5 MG TABS tablet TAKE 1 TABLET BY MOUTH TWICE A DAY 180 tablet 1  . furosemide (LASIX) 20  MG tablet TAKE 1 TABLET BY MOUTH ON MONDAY, WEDNESDAY, AND FRIDAY ONLY 15 tablet 6  . metoprolol succinate (TOPROL-XL) 25 MG 24 hr tablet TAKE 1 TABLET BY MOUTH TWICE A DAY 180 tablet 0  . montelukast (SINGULAIR) 10 MG tablet Take 10 mg by mouth at bedtime.     . Multiple Vitamins-Minerals (ICAPS AREDS 2 PO) Take 2 tablets by mouth daily.     . nitroGLYCERIN (NITROSTAT) 0.4 MG SL tablet Place 0.4 mg under the tongue every 5 (five) minutes as needed for chest pain (x 3 tabs daily).    . predniSONE (DELTASONE) 5 MG tablet Take 5 mg by mouth every other day. 1/2 tab by mouth every other day     No current  facility-administered medications for this visit.     Allergies:   Promethazine hcl; Sulfonamide derivatives; and Tequin    Social History:  The patient  reports that she has never smoked. She has never used smokeless tobacco. She reports that she does not drink alcohol or use drugs.   Family History:  The patient's family history is not on file.    ROS: All other systems are reviewed and negative. Unless otherwise mentioned in H&P    PHYSICAL EXAM: VS:  There were no vitals taken for this visit. , BMI There is no height or weight on file to calculate BMI. GEN: Well nourished, well developed, in no acute distress HEENT: normal Neck: no JVD, carotid bruits, or masses Cardiac: ***RRR; no murmurs, rubs, or gallops,no edema  Respiratory:  Clear to auscultation bilaterally, normal work of breathing GI: soft, nontender, nondistended, + BS MS: no deformity or atrophy Skin: warm and dry, no rash Neuro:  Strength and sensation are intact Psych: euthymic mood, full affect   EKG:  EKG {ACTION; IS/IS [id number]} ordered today. The ekg ordered today demonstrates ***   Recent Labs: 12/11/2017: Hemoglobin 11.8; Platelets 134; TSH 8.810    Lipid Panel No results found for: CHOL, TRIG, HDL, CHOLHDL, VLDL, LDLCALC, LDLDIRECT    Wt Readings from Last 3 Encounters:  12/11/17 102 lb 6.4 oz (46.4 kg)  04/28/17 107 lb (48.5 kg)  12/30/16 110 lb (49.9 kg)      Other studies Reviewed: Additional studies/ records that were reviewed today include: ***. Review of the above records demonstrates: ***   ASSESSMENT AND PLAN:  1.  ***   Current medicines are reviewed at length with the patient today.    Labs/ tests ordered today include: *** Phill Myron. [place], ANP, AACC   05/18/2018 11:27 AM    [facility] [address] Office [phone] Fax [phone]

## 2018-05-18 NOTE — Telephone Encounter (Signed)
Spoke with pt. Pt sts that she will be able to keep the appt today at 2pm

## 2018-05-18 NOTE — ED Triage Notes (Signed)
Per EMS:  Pt presents to ED for assessment of SOB, worse with exertion, for two weeks.  Today, as her nephew-in-law was bringing her to her cardiologist, the SOB became worse and he called 911.  EKG shows atrial pacing, hx of same.  VSS en route

## 2018-05-18 NOTE — ED Provider Notes (Signed)
[facility] EMERGENCY DEPARTMENT Provider Note   CSN: [id number] Arrival date & time: 05/18/18  1421     History   Chief Complaint Chief Complaint  Patient presents with  . Shortness of Breath    HPI Nancy Bush is a 84 y.o. female.  The history is provided by the patient.  Shortness of Breath  This is a chronic problem. The problem occurs continuously.The current episode started more than 1 week ago. The problem has been gradually worsening. Associated symptoms include orthopnea. Pertinent negatives include no fever, no headaches, no coryza, no rhinorrhea, no sore throat, no swollen glands, no ear pain, no neck pain, no cough, no sputum production, no hemoptysis, no wheezing, no PND, no chest pain, no syncope, no vomiting, no abdominal pain, no rash, no leg pain, no leg swelling and no claudication. It is unknown what precipitated the problem. Treatments tried: her daily medications  The treatment provided mild relief. She has had prior hospitalizations. Associated medical issues include COPD, chronic lung disease and heart failure. Associated medical issues do not include asthma. Associated medical issues comments: afib, mitral val repair, pacer.    Past Medical History:  Diagnosis Date  . Asthma   . Atrial fibrillation ([place])   . CHF (congestive heart failure) ([place])   . COPD (chronic obstructive pulmonary disease) ([place])   . H/O: hysterectomy   . Hypertension   . LBBB (left bundle branch block)    per discharge note 2003  . Mitral regurgitation    MILD  . MVP (mitral valve prolapse)   . Pacemaker   . Pulmonary hypertension ([place])    MILD TO MODERATE BY ECHO  . PVC (premature ventricular contraction)   . Rheumatoid arthritis(714.0)   . S/P Maze operation for atrial fibrillation 05/18/2012   Complete biatrial lesion set using cryothermy with oversewing of LA appendage  . S/P mitral valve replacement with bioprosthetic valve 05/18/2012   76mm  Edwards Magna Mitral pericardial bioprosthesis     Patient Active Problem List   Diagnosis Date Noted  . Essential hypertension 12/11/2017  . Medication management 12/11/2017  . Pulmonary HTN ([place]) 12/30/2016  . Protein calorie malnutrition ([place]) 08/22/2012  . CAP (community acquired pneumonia) 08/21/2012  . Hypokalemia 08/21/2012  . SOB (shortness of breath) 08/20/2012  . FTT (failure to thrive) in adult 08/20/2012  . Leucocytosis 08/20/2012  . Atrial flutter ([place]) 06/24/2012  . CKD (chronic kidney disease), stage III ([place]) 06/24/2012  . Pleural effusion 06/24/2012  . [employer] 05/25/2012  . Bradycardia 05/21/2012  . S/P mitral valve replacement with bioprosthetic valve 05/18/2012  . S/P Maze operation for atrial fibrillation 05/18/2012  . Severe mitral regurgitation 05/17/2012  . A-fib ([place]) 05/17/2012  . MR (mitral regurgitation) 05/03/2012  . Fatigue 01/29/2012  . Acute on chronic diastolic heart failure ([place]) 06/10/2011  . CKD (chronic kidney disease) 06/03/2011  . Encounter for long-term (current) use of anticoagulants 05/26/2011  . Atrial fibrillation ([place]) 05/19/2011  . CHEST PAIN UNSPECIFIED 08/28/2010  . ABNORMAL ELECTROCARDIOGRAM 11/14/2009  . Mitral Regurgitation 11/12/2009  . PULMONARY HYPERTENSION 11/12/2009  . Mitral valve disorder 11/12/2009  . SINUSITIS, CHRONIC 06/12/2007  . ALLERGIC RHINITIS 06/12/2007  . BRONCHITIS 06/12/2007  . COPD 06/12/2007    Past Surgical History:  Procedure Laterality Date  . ABDOMINAL HYSTERECTOMY    . APPENDECTOMY    . CATARACT EXTRACTION    . CHOLECYSTECTOMY    . hysterectomy    . MAZE  05/18/2012   Procedure:  MAZE;  Surgeon: Rexene Alberts, MD;  Location: [facility];  Service: Open Heart Surgery;  Laterality: Right;  . MITRAL VALVE REPLACEMENT  05/18/2012   Procedure: MINIMALLY INVASIVE MITRAL VALVE (MV) REPLACEMENT;  Surgeon: Rexene Alberts, MD;  Location: [facility];  Service: Open Heart Surgery;   Laterality: N/A;  . OVARIAN CYST REMOVAL    . PACEMAKER INSERTION    . PERMANENT PACEMAKER INSERTION N/A 05/21/2012   Procedure: PERMANENT PACEMAKER INSERTION;  Surgeon: Evans Lance, MD;  Location: [facility] CATH LAB;  Service: Cardiovascular;  Laterality: N/A;  . TEE WITHOUT CARDIOVERSION  02/09/2012   Procedure: TRANSESOPHAGEAL ECHOCARDIOGRAM (TEE);  Surgeon: Fay Records, MD;  Location: [facility] ENDOSCOPY;  Service: Cardiovascular;  Laterality: N/A;     OB History   None      Home Medications    Prior to Admission medications   Medication Sig Start Date End Date Taking? Authorizing Provider  albuterol (PROVENTIL HFA;VENTOLIN HFA) 108 (90 Base) MCG/ACT inhaler Inhale 2 puffs into the lungs every 6 (six) hours as needed for wheezing or shortness of breath.   Yes [provider]  Calcium Carbonate-Vitamin D (CALTRATE 600+D) 600-400 MG-UNIT per tablet Take 1 tablet by mouth daily.    Yes [provider]  ELIQUIS 2.5 MG TABS tablet TAKE 1 TABLET BY MOUTH TWICE A DAY Patient taking differently: Take 2.5 mg by mouth 2 (two) times daily.  01/15/18  Yes Minus Breeding, MD  furosemide (LASIX) 20 MG tablet TAKE 1 TABLET BY MOUTH ON MONDAY, WEDNESDAY, AND FRIDAY ONLY Patient taking differently: Take 20 mg by mouth every Monday, Wednesday, and Friday.  11/05/17  Yes Minus Breeding, MD  metoprolol succinate (TOPROL-XL) 25 MG 24 hr tablet TAKE 1 TABLET BY MOUTH TWICE A DAY Patient taking differently: Take 25 mg by mouth daily.  04/01/18  Yes Minus Breeding, MD  montelukast (SINGULAIR) 10 MG tablet Take 10 mg by mouth at bedtime.  12/31/12  Yes [provider]  Multiple Vitamins-Minerals (ICAPS AREDS 2 PO) Take 2 tablets by mouth daily.    Yes [provider]  nitroGLYCERIN (NITROSTAT) 0.4 MG SL tablet Place 0.4 mg under the tongue every 5 (five) minutes as needed for chest pain (x 3 tabs daily).   Yes [provider]  predniSONE (DELTASONE) 5 MG tablet Take 2.5 mg by  mouth every other day.    Yes [provider]  amoxicillin (AMOXIL) 500 MG capsule Take 2 grams ( 4 tablets ) 1 hour before dental work 03/06/17   Minus Breeding, MD  amoxicillin (AMOXIL) 500 MG capsule TAKE 2 GRAMS ( 4 TABLETS ) 1 HOUR BEFORE DENTAL WORK Patient not taking: Reported on 05/18/2018 06/04/17   Minus Breeding, MD    Family History History reviewed. No pertinent family history.  Social History Social History   Tobacco Use  . Smoking status: Never Smoker  . Smokeless tobacco: Never Used  Substance Use Topics  . Alcohol use: No  . Drug use: No     Allergies   Promethazine hcl; Sulfonamide derivatives; and Tequin   Review of Systems Review of Systems  Constitutional: Negative for chills and fever.  HENT: Negative for ear pain, rhinorrhea and sore throat.   Eyes: Negative for pain and visual disturbance.  Respiratory: Positive for shortness of breath. Negative for cough, hemoptysis, sputum production and wheezing.   Cardiovascular: Positive for orthopnea. Negative for chest pain, palpitations, claudication, leg swelling, syncope and PND.  Gastrointestinal: Negative for abdominal pain and  vomiting.  Genitourinary: Negative for dysuria and hematuria.  Musculoskeletal: Negative for arthralgias, back pain and neck pain.  Skin: Negative for color change and rash.  Neurological: Negative for seizures, syncope and headaches.  All other systems reviewed and are negative.    Physical Exam Updated Vital Signs  ED Triage Vitals  Enc Vitals Group     BP 05/18/18 1428 (!) 126/104     Pulse Rate 05/18/18 1428 65     Resp 05/18/18 1428 16     Temp 05/18/18 1428 97.7 F (36.5 C)     Temp Source 05/18/18 1428 Oral     SpO2 05/18/18 1428 100 %     Weight 05/18/18 1441 102 lb 4.7 oz (46.4 kg)     Height 05/18/18 1441 4\' 11"  (1.499 m)     Head Circumference --      Peak Flow --      Pain Score 05/18/18 1421 0     Pain Loc --      Pain Edu? --      Excl. in [place]?  --     Physical Exam  Constitutional: She is oriented to person, place, and time. She appears well-developed and well-nourished. No distress.  HENT:  Head: Normocephalic and atraumatic.  Eyes: Pupils are equal, round, and reactive to light. Conjunctivae and EOM are normal.  Neck: Normal range of motion. Neck supple.  Cardiovascular: Normal rate and regular rhythm.  No murmur heard. Pulmonary/Chest: Effort normal. No accessory muscle usage. No tachypnea. No respiratory distress. She has rales.  Abdominal: Soft. Bowel sounds are normal. There is no tenderness.  Musculoskeletal: She exhibits no edema.       Right lower leg: She exhibits no edema.       Left lower leg: She exhibits no edema.  Neurological: She is alert and oriented to person, place, and time.  Skin: Skin is warm and dry. Capillary refill takes less than 2 seconds.  Psychiatric: She has a normal mood and affect.  Nursing note and vitals reviewed.    ED Treatments / Results  Labs (all labs ordered are listed, but only abnormal results are displayed) Labs Reviewed  BRAIN NATRIURETIC PEPTIDE - Abnormal; Notable for the following components:      Result Value   B Natriuretic Peptide 508.0 (*)    All other components within normal limits  COMPREHENSIVE METABOLIC PANEL - Abnormal; Notable for the following components:   Glucose, Bld 103 (*)    Creatinine, Ser 1.04 (*)    Calcium 8.3 (*)    Total Protein 6.4 (*)    GFR calc non Af Amer 46 (*)    GFR calc Af Amer 54 (*)    All other components within normal limits  CBC WITH DIFFERENTIAL/PLATELET - Abnormal; Notable for the following components:   WBC 3.2 (*)    RBC 3.54 (*)    Hemoglobin 10.3 (*)    HCT 33.3 (*)    Platelets 83 (*)    All other components within normal limits  MAGNESIUM  I-STAT TROPONIN, ED    EKG EKG Interpretation  Date/Time:  Tuesday May 18 2018 16:32:19 EDT Ventricular Rate:  60 PR Interval:    QRS Duration: 155 QT  Interval:  523 QTC Calculation: 523 R Axis:   -88 Text Interpretation:  Atrial-paced rhythm Nonspecific IVCD with LAD LVH with secondary repolarization abnormality Inferior infarct, old Anterior infarct, old Confirmed by Lennice Sites [id number]) on 05/18/2018 4:34:11 PM   Radiology Dg Chest  Portable 1 View  Result Date: 05/18/2018 CLINICAL DATA:  Worsening dyspnea on exertion over the last 2 weeks. EXAM: PORTABLE CHEST 1 VIEW COMPARISON:  Chest x-ray dated August 05, 2013. FINDINGS: Unchanged left chest wall AICD. Stable cardiomediastinal silhouette status post mitral valve replacement. Normal pulmonary vascularity. New small left and trace right pleural effusions. Left greater than right basilar atelectasis. No consolidation or pneumothorax. No acute osseous abnormality. IMPRESSION: 1. New small left and trace right pleural effusions. Electronically Signed   By: Titus Dubin M.D.   On: 05/18/2018 14:58    Procedures Procedures (including critical care time)  Medications Ordered in ED Medications  furosemide (LASIX) injection 20 mg (has no administration in time range)     Initial Impression / Assessment and Plan / ED Course  I have reviewed the triage vital signs and the nursing notes.  Pertinent labs & imaging results that were available during my care of the patient were reviewed by me and considered in my medical decision making (see chart for details).     ANUSHREE DORSI is an 84 year old female history of mitral regurgitation status post valvular repair, atrial fibrillation, COPD, CHF who presents to the ED with shortness of breath.  Patient with unremarkable vitals.  No fever.  Breathing well on room air.  EKG shows atrial paced rhythm.  No new ischemic changes.  Patient without any chest pain.  Patient with progressive shortness of breath over the last several days to weeks.  Patient was supposed to follow-up with cardiology this afternoon but continued to feel worse and  came to the ED for evaluation.  Patient has some mild rales on exam.  No signs of peripheral edema.  Chest x-ray shows small bilateral pleural effusions.  BNP mildly elevated.  Troponin within normal limits.  Otherwise no significant electrolyte abnormality, creatinine at baseline, no significant leukocytosis anemia.  No concern for infectious process including pneumonia.  No concern for COPD exacerbation.  Contacted Dr. Donita Brooks with cardiology and he recommends that patient take her Lasix every day.  Currently she takes her Lasix Monday Wednesday Friday.  Patient given IV Lasix while in the ED.  She will take Lasix every day for the next 5 days and follow-up with cardiology and will likely need echocardiogram at that time. Pt with no resp distess and HDS throughout my care.  Discharged from ED in good condition.  Told to return to ED if symptoms worsen.  This chart was dictated using voice recognition software.  Despite best efforts to proofread,  errors can occur which can change the documentation meaning.   Final Clinical Impressions(s) / ED Diagnoses   Final diagnoses:  Acute on chronic congestive heart failure, unspecified heart failure type [facility])    ED Discharge Orders    None       Lennice Sites, DO 05/18/18 1740

## 2018-05-18 NOTE — ED Notes (Signed)
Patient verbalizes understanding of discharge instructions. Opportunity for questioning and answers were provided. Armband removed by staff, pt discharged from ED ambulatory.   

## 2018-05-18 NOTE — ED Notes (Signed)
Pt ambulatory to restroom

## 2018-05-18 NOTE — Telephone Encounter (Signed)
Spoke with pt. Pt sts that she has been having increased sob. This is worse when she tries to move around, at times she feels weak and shaky. She denies chest pian, palpitations, swelling, orthopnea. Pt sts that she would like to be seen. appt scheduled with Jory Sims, D-NP for today @ 2pm. Pt sts that she will need to call her niece to see if she will be able to bring her.  Pt sts that she wil call back if she is unable to make her appt.

## 2018-05-19 ENCOUNTER — Observation Stay (HOSPITAL_COMMUNITY)
Admission: EM | Admit: 2018-05-19 | Discharge: 2018-05-21 | Disposition: A | Discharge status: Home / Self Care | Payer: Medicare Other | Attending: Internal Medicine | Admitting: Internal Medicine

## 2018-05-19 ENCOUNTER — Observation Stay (HOSPITAL_COMMUNITY): Payer: Medicare Other

## 2018-05-19 ENCOUNTER — Emergency Department (HOSPITAL_COMMUNITY): Payer: Medicare Other

## 2018-05-19 ENCOUNTER — Encounter (HOSPITAL_COMMUNITY): Payer: Self-pay

## 2018-05-19 ENCOUNTER — Other Ambulatory Visit: Payer: Self-pay

## 2018-05-19 DIAGNOSIS — J449 Chronic obstructive pulmonary disease, unspecified: Secondary | ICD-10-CM | POA: Insufficient documentation

## 2018-05-19 DIAGNOSIS — N183 Chronic kidney disease, stage 3 (moderate): Secondary | ICD-10-CM | POA: Diagnosis not present

## 2018-05-19 DIAGNOSIS — R41841 Cognitive communication deficit: Secondary | ICD-10-CM | POA: Diagnosis not present

## 2018-05-19 DIAGNOSIS — Z882 Allergy status to sulfonamides status: Secondary | ICD-10-CM | POA: Diagnosis not present

## 2018-05-19 DIAGNOSIS — R4182 Altered mental status, unspecified: Secondary | ICD-10-CM | POA: Diagnosis not present

## 2018-05-19 DIAGNOSIS — I2721 Secondary pulmonary arterial hypertension: Secondary | ICD-10-CM | POA: Insufficient documentation

## 2018-05-19 DIAGNOSIS — G454 Transient global amnesia: Principal | ICD-10-CM | POA: Diagnosis present

## 2018-05-19 DIAGNOSIS — M069 Rheumatoid arthritis, unspecified: Secondary | ICD-10-CM | POA: Diagnosis not present

## 2018-05-19 DIAGNOSIS — E46 Unspecified protein-calorie malnutrition: Secondary | ICD-10-CM | POA: Insufficient documentation

## 2018-05-19 DIAGNOSIS — Z7901 Long term (current) use of anticoagulants: Secondary | ICD-10-CM | POA: Insufficient documentation

## 2018-05-19 DIAGNOSIS — E876 Hypokalemia: Secondary | ICD-10-CM | POA: Insufficient documentation

## 2018-05-19 DIAGNOSIS — Z79899 Other long term (current) drug therapy: Secondary | ICD-10-CM | POA: Insufficient documentation

## 2018-05-19 DIAGNOSIS — Z953 Presence of xenogenic heart valve: Secondary | ICD-10-CM

## 2018-05-19 DIAGNOSIS — Z95810 Presence of automatic (implantable) cardiac defibrillator: Secondary | ICD-10-CM | POA: Diagnosis not present

## 2018-05-19 DIAGNOSIS — R262 Difficulty in walking, not elsewhere classified: Secondary | ICD-10-CM | POA: Diagnosis not present

## 2018-05-19 DIAGNOSIS — G459 Transient cerebral ischemic attack, unspecified: Secondary | ICD-10-CM

## 2018-05-19 DIAGNOSIS — Z950 Presence of cardiac pacemaker: Secondary | ICD-10-CM | POA: Diagnosis present

## 2018-05-19 DIAGNOSIS — I447 Left bundle-branch block, unspecified: Secondary | ICD-10-CM | POA: Insufficient documentation

## 2018-05-19 DIAGNOSIS — D61818 Other pancytopenia: Secondary | ICD-10-CM | POA: Insufficient documentation

## 2018-05-19 DIAGNOSIS — I130 Hypertensive heart and chronic kidney disease with heart failure and stage 1 through stage 4 chronic kidney disease, or unspecified chronic kidney disease: Secondary | ICD-10-CM | POA: Insufficient documentation

## 2018-05-19 DIAGNOSIS — I4891 Unspecified atrial fibrillation: Secondary | ICD-10-CM | POA: Diagnosis not present

## 2018-05-19 DIAGNOSIS — I517 Cardiomegaly: Secondary | ICD-10-CM | POA: Diagnosis not present

## 2018-05-19 DIAGNOSIS — J90 Pleural effusion, not elsewhere classified: Secondary | ICD-10-CM | POA: Diagnosis not present

## 2018-05-19 DIAGNOSIS — I340 Nonrheumatic mitral (valve) insufficiency: Secondary | ICD-10-CM | POA: Diagnosis not present

## 2018-05-19 DIAGNOSIS — I5033 Acute on chronic diastolic (congestive) heart failure: Secondary | ICD-10-CM | POA: Diagnosis not present

## 2018-05-19 DIAGNOSIS — R410 Disorientation, unspecified: Secondary | ICD-10-CM | POA: Diagnosis not present

## 2018-05-19 LAB — CBC WITH DIFFERENTIAL/PLATELET
Abs Immature Granulocytes: 0.01 10*3/uL (ref 0.00–0.07)
Basophils Absolute: 0 10*3/uL (ref 0.0–0.1)
Basophils Relative: 0 %
Eosinophils Absolute: 0.1 10*3/uL (ref 0.0–0.5)
Eosinophils Relative: 2 %
HCT: 35.8 % — ABNORMAL LOW (ref 36.0–46.0)
Hemoglobin: 11 g/dL — ABNORMAL LOW (ref 12.0–15.0)
Immature Granulocytes: 0 %
Lymphocytes Relative: 25 %
Lymphs Abs: 0.7 10*3/uL (ref 0.7–4.0)
MCH: 28.9 pg (ref 26.0–34.0)
MCHC: 30.7 g/dL (ref 30.0–36.0)
MCV: 94 fL (ref 80.0–100.0)
Monocytes Absolute: 0.6 10*3/uL (ref 0.1–1.0)
Monocytes Relative: 22 %
Neutro Abs: 1.5 10*3/uL — ABNORMAL LOW (ref 1.7–7.7)
Neutrophils Relative %: 51 %
Platelets: 91 10*3/uL — ABNORMAL LOW (ref 150–400)
RBC: 3.81 MIL/uL — ABNORMAL LOW (ref 3.87–5.11)
RDW: 14.6 % (ref 11.5–15.5)
WBC: 2.9 10*3/uL — ABNORMAL LOW (ref 4.0–10.5)
nRBC: 0 % (ref 0.0–0.2)

## 2018-05-19 LAB — BASIC METABOLIC PANEL
Anion gap: 9 (ref 5–15)
BUN: 14 mg/dL (ref 8–23)
CO2: 26 mmol/L (ref 22–32)
Calcium: 8.7 mg/dL — ABNORMAL LOW (ref 8.9–10.3)
Chloride: 104 mmol/L (ref 98–111)
Creatinine, Ser: 0.94 mg/dL (ref 0.44–1.00)
GFR calc Af Amer: >60 mL/min (ref >60)
GFR calc non Af Amer: 52 mL/min — ABNORMAL LOW (ref >60)
Glucose, Bld: 109 mg/dL — ABNORMAL HIGH (ref 70–99)
Potassium: 4 mmol/L (ref 3.5–5.1)
Sodium: 139 mmol/L (ref 135–145)

## 2018-05-19 LAB — URINALYSIS, ROUTINE W REFLEX MICROSCOPIC
Bilirubin Urine: NEGATIVE
Glucose, UA: NEGATIVE mg/dL
Hgb urine dipstick: NEGATIVE
Ketones, ur: NEGATIVE mg/dL
Leukocytes, UA: NEGATIVE
Nitrite: NEGATIVE
Protein, ur: NEGATIVE mg/dL
Specific Gravity, Urine: 1.008 (ref 1.005–1.030)
pH: 6 (ref 5.0–8.0)

## 2018-05-19 MED ORDER — INFLUENZA VAC SPLIT HIGH-DOSE 0.5 ML IM SUSY
0.5000 mL | PREFILLED_SYRINGE | INTRAMUSCULAR | Status: DC
  Filled 2018-05-19: qty 0.5

## 2018-05-19 MED ORDER — ALBUTEROL SULFATE (2.5 MG/3ML) 0.083% IN NEBU: 3.0000 mL | INHALATION_SOLUTION | Freq: Four times a day (QID) | RESPIRATORY_TRACT | Status: DC | PRN

## 2018-05-19 MED ORDER — PREDNISONE 2.5 MG PO TABS
2.5000 mg | ORAL_TABLET | ORAL | Status: DC
  Administered 2018-05-20: 2.5 mg via ORAL
  Filled 2018-05-19: qty 1

## 2018-05-19 MED ORDER — ACETAMINOPHEN 650 MG RE SUPP: 650.0000 mg | RECTAL | Status: DC | PRN

## 2018-05-19 MED ORDER — FUROSEMIDE 40 MG PO TABS
20.0000 mg | ORAL_TABLET | Freq: Every day | ORAL | Status: DC
  Administered 2018-05-19: 20 mg via ORAL
  Filled 2018-05-19: qty 1

## 2018-05-19 MED ORDER — APIXABAN 2.5 MG PO TABS
2.5000 mg | ORAL_TABLET | Freq: Two times a day (BID) | ORAL | Status: DC
  Administered 2018-05-19 – 2018-05-21 (×4): 2.5 mg via ORAL
  Filled 2018-05-19 (×4): qty 1

## 2018-05-19 MED ORDER — CALCIUM CARBONATE-VITAMIN D 500-200 MG-UNIT PO TABS
1.0000 | ORAL_TABLET | Freq: Every day | ORAL | Status: DC
  Administered 2018-05-20 – 2018-05-21 (×2): 1 via ORAL
  Filled 2018-05-19 (×2): qty 1

## 2018-05-19 MED ORDER — PROSIGHT PO TABS
ORAL_TABLET | Freq: Every day | ORAL | Status: DC
  Administered 2018-05-20 – 2018-05-21 (×2): 1 via ORAL
  Filled 2018-05-19 (×2): qty 1

## 2018-05-19 MED ORDER — FUROSEMIDE 40 MG PO TABS: 20.0000 mg | ORAL_TABLET | Freq: Every day | ORAL | Status: DC

## 2018-05-19 MED ORDER — METOPROLOL SUCCINATE ER 25 MG PO TB24
25.0000 mg | ORAL_TABLET | Freq: Every day | ORAL | Status: DC
  Administered 2018-05-19 – 2018-05-21 (×3): 25 mg via ORAL
  Filled 2018-05-19 (×3): qty 1

## 2018-05-19 MED ORDER — METOPROLOL SUCCINATE ER 25 MG PO TB24: 25.0000 mg | ORAL_TABLET | Freq: Every day | ORAL | Status: DC

## 2018-05-19 MED ORDER — ACETAMINOPHEN 160 MG/5ML PO SOLN: 650.0000 mg | ORAL | Status: DC | PRN

## 2018-05-19 MED ORDER — ACETAMINOPHEN 325 MG PO TABS: 650.0000 mg | ORAL_TABLET | ORAL | Status: DC | PRN

## 2018-05-19 MED ORDER — MONTELUKAST SODIUM 10 MG PO TABS
10.0000 mg | ORAL_TABLET | Freq: Every day | ORAL | Status: DC
  Administered 2018-05-19 – 2018-05-20 (×2): 10 mg via ORAL
  Filled 2018-05-19 (×2): qty 1

## 2018-05-19 MED ORDER — STROKE: EARLY STAGES OF RECOVERY BOOK
Freq: Once | Status: DC
  Filled 2018-05-19: qty 1

## 2018-05-19 NOTE — ED Provider Notes (Signed)
[facility] DEPT Provider Note   CSN: [id number] Arrival date & time: 05/19/18  1750     History   Chief Complaint Chief Complaint  Patient presents with  . Altered Mental Status    HPI Nancy Bush is a 84 y.o. female with history of asthma, A. fib, CHF, COPD, mitral regurgitation and mitral valve prolapse status post bioprosthetic valve replacement, and rheumatoid arthritis presents for evaluation of acute onset, persistent altered mental status.  Patient's niece, who interacts with her on a daily basis states that she was in her usual state of health at around 10:30 AM this morning when she and her husband went to visit her.  She states that they took her trash out and went to the pharmacy to pick up her Eliquis refill.  The patient called them at around 3:30 PM stating that she was having difficulty remembering the events of this morning.  She states that she felt like she could not think clearly.  Patient's niece states that she was not answering questions and could not remember anything that she had told her 5 minutes ago.  She could not remember why she was in the hospital yesterday or that she was given in the hospital.  Niece also notes that the patient has been quite fatigued over the past 2 weeks and has been losing weight.  Her PCP has been evaluating her for this.  Of note, patient was seen and evaluated at [facility], ED yesterday for shortness of breath that had been ongoing for 2 days.  She notes dyspnea on exertion, denies chest pain.  Work-up was consistent with CHF exacerbation at the time.  Cardiology was contacted and recommended increasing patient's Lasix from Monday Wednesday Friday to 1 tablet daily.  She was given a dose of 20mg  IV Lasix in the ED.  Patient denies fever, abdominal pain, nausea, vomiting, headaches, lightheadedness, numbness, or weakness.  She typically ambulates with the aid of a cane.  She lives at home alone.  The  history is provided by the patient and a relative.    Past Medical History:  Diagnosis Date  . Asthma   . Atrial fibrillation ([place])   . CHF (congestive heart failure) ([place])   . COPD (chronic obstructive pulmonary disease) ([place])   . H/O: hysterectomy   . Hypertension   . LBBB (left bundle branch block)    per discharge note 2003  . Mitral regurgitation    MILD  . MVP (mitral valve prolapse)   . Pacemaker   . Pulmonary hypertension ([place])    MILD TO MODERATE BY ECHO  . PVC (premature ventricular contraction)   . Rheumatoid arthritis(714.0)   . S/P Maze operation for atrial fibrillation 05/18/2012   Complete biatrial lesion set using cryothermy with oversewing of LA appendage  . S/P mitral valve replacement with bioprosthetic valve 05/18/2012   19mm Edwards Magna Mitral pericardial bioprosthesis     Patient Active Problem List   Diagnosis Date Noted  . TGA (transient global amnesia) 05/19/2018  . Essential hypertension 12/11/2017  . Medication management 12/11/2017  . Pulmonary HTN ([place]) 12/30/2016  . Protein calorie malnutrition ([place]) 08/22/2012  . CAP (community acquired pneumonia) 08/21/2012  . Hypokalemia 08/21/2012  . SOB (shortness of breath) 08/20/2012  . FTT (failure to thrive) in adult 08/20/2012  . Leucocytosis 08/20/2012  . Atrial flutter ([place]) 06/24/2012  . CKD (chronic kidney disease), stage III ([place]) 06/24/2012  . Pleural effusion 06/24/2012  . [employer]  05/25/2012  . Bradycardia 05/21/2012  . S/P mitral valve replacement with bioprosthetic valve 05/18/2012  . S/P Maze operation for atrial fibrillation 05/18/2012  . Severe mitral regurgitation 05/17/2012  . A-fib ([place]) 05/17/2012  . MR (mitral regurgitation) 05/03/2012  . Fatigue 01/29/2012  . Acute on chronic diastolic heart failure ([place]) 06/10/2011  . CKD (chronic kidney disease) 06/03/2011  . Encounter for long-term (current) use of anticoagulants 05/26/2011  . Atrial fibrillation  ([place]) 05/19/2011  . CHEST PAIN UNSPECIFIED 08/28/2010  . ABNORMAL ELECTROCARDIOGRAM 11/14/2009  . Mitral Regurgitation 11/12/2009  . PULMONARY HYPERTENSION 11/12/2009  . Mitral valve disorder 11/12/2009  . SINUSITIS, CHRONIC 06/12/2007  . ALLERGIC RHINITIS 06/12/2007  . BRONCHITIS 06/12/2007  . COPD 06/12/2007    Past Surgical History:  Procedure Laterality Date  . ABDOMINAL HYSTERECTOMY    . APPENDECTOMY    . CATARACT EXTRACTION    . CHOLECYSTECTOMY    . hysterectomy    . MAZE  05/18/2012   Procedure: MAZE;  Surgeon: Rexene Alberts, MD;  Location: [facility];  Service: Open Heart Surgery;  Laterality: Right;  . MITRAL VALVE REPLACEMENT  05/18/2012   Procedure: MINIMALLY INVASIVE MITRAL VALVE (MV) REPLACEMENT;  Surgeon: Rexene Alberts, MD;  Location: [facility];  Service: Open Heart Surgery;  Laterality: N/A;  . OVARIAN CYST REMOVAL    . PACEMAKER INSERTION    . PERMANENT PACEMAKER INSERTION N/A 05/21/2012   Procedure: PERMANENT PACEMAKER INSERTION;  Surgeon: Evans Lance, MD;  Location: [facility] CATH LAB;  Service: Cardiovascular;  Laterality: N/A;  . TEE WITHOUT CARDIOVERSION  02/09/2012   Procedure: TRANSESOPHAGEAL ECHOCARDIOGRAM (TEE);  Surgeon: Fay Records, MD;  Location: [facility] ENDOSCOPY;  Service: Cardiovascular;  Laterality: N/A;     OB History   None      Home Medications    Prior to Admission medications   Medication Sig Start Date End Date Taking? Authorizing Provider  albuterol (PROVENTIL HFA;VENTOLIN HFA) 108 (90 Base) MCG/ACT inhaler Inhale 2 puffs into the lungs every 6 (six) hours as needed for wheezing or shortness of breath.   Yes [provider]  Calcium Carbonate-Vitamin D (CALTRATE 600+D) 600-400 MG-UNIT per tablet Take 1 tablet by mouth daily.    Yes [provider]  ELIQUIS 2.5 MG TABS tablet TAKE 1 TABLET BY MOUTH TWICE A DAY Patient taking differently: Take 2.5 mg by mouth 2 (two) times daily.  01/15/18  Yes Hochrein, Jeneen Rinks, MD  furosemide (LASIX)  20 MG tablet TAKE 1 TABLET BY MOUTH ON MONDAY, WEDNESDAY, AND FRIDAY ONLY Patient taking differently: Take 20 mg by mouth daily.  11/05/17  Yes Minus Breeding, MD  metoprolol succinate (TOPROL-XL) 25 MG 24 hr tablet TAKE 1 TABLET BY MOUTH TWICE A DAY Patient taking differently: Take 25 mg by mouth daily.  04/01/18  Yes Minus Breeding, MD  montelukast (SINGULAIR) 10 MG tablet Take 10 mg by mouth at bedtime.  12/31/12  Yes [provider]  Multiple Vitamins-Minerals (ICAPS AREDS 2 PO) Take 2 tablets by mouth daily.    Yes [provider]  nitroGLYCERIN (NITROSTAT) 0.4 MG SL tablet Place 0.4 mg under the tongue every 5 (five) minutes as needed for chest pain (x 3 tabs daily).   Yes [provider]  predniSONE (DELTASONE) 5 MG tablet Take 2.5 mg by mouth every other day.    Yes [provider]    Family History No family history on file.  Social History Social History   Tobacco Use  . Smoking  status: Never Smoker  . Smokeless tobacco: Never Used  Substance Use Topics  . Alcohol use: No  . Drug use: No     Allergies   Promethazine hcl; Sulfonamide derivatives; and Tequin   Review of Systems Review of Systems  Constitutional: Negative for chills and fever.  Respiratory: Positive for shortness of breath.   Cardiovascular: Negative for chest pain.     Physical Exam Updated Vital Signs BP 137/64   Pulse 63   Temp 97.8 F (36.6 C) (Oral)   Resp 20   Ht 4\' 11"  (1.499 m)   Wt 46.4 kg   SpO2 98%   BMI 20.66 kg/m   Physical Exam  Constitutional: She is oriented to person, place, and time. She appears well-developed and well-nourished. No distress.  HENT:  Head: Normocephalic and atraumatic.  Eyes: Pupils are equal, round, and reactive to light. Conjunctivae and EOM are normal. Right eye exhibits no discharge. Left eye exhibits no discharge.  Neck: Normal range of motion. Neck supple. No JVD present. No tracheal deviation present.    Cardiovascular: Normal rate.  Murmur heard. Mechanical valve click noted, trace pitting edema of the bilateral lower extremities.  Compartments are soft.  Homans sign absent bilaterally.  Pulmonary/Chest: Effort normal.  Scattered crackles, equal rise and fall of chest, no increased work of breathing.  Speaking in full sentences without difficulty.  Abdominal: Soft. Bowel sounds are normal. She exhibits no distension. There is no tenderness.  Musculoskeletal: She exhibits no edema.  No midline spine TTP, no paraspinal muscle tenderness, no deformity, crepitus, or step-off noted.  5/5 strength of BUE and BLE major muscle groups.  Neurological: She is alert and oriented to person, place, and time. No cranial nerve deficit or sensory deficit. She exhibits normal muscle tone.  Mental Status:  Alert, thought content appropriate, able to give a coherent history. Speech fluent without evidence of aphasia. Able to follow 2 step commands without difficulty.  Cranial Nerves:  II:  Peripheral visual fields grossly normal, pupils equal, round, reactive to light III,IV, VI: ptosis not present, extra-ocular motions intact bilaterally  V,VII: smile symmetric, facial light touch sensation equal VIII: hearing grossly normal to voice  X: uvula elevates symmetrically  XI: bilateral shoulder shrug symmetric and strong XII: midline tongue extension without fassiculations Motor:  Normal tone. 5/5 strength of BUE and BLE major muscle groups including strong and equal grip strength and dorsiflexion/plantar flexion Sensory: light touch normal in all extremities. Gait: Ambulates with a slightly shuffling gait with minimal assistance.  Exhibits good balance.  No pronator drift.   Skin: Skin is warm and dry. No erythema.  Psychiatric: She has a normal mood and affect. Her behavior is normal.  Nursing note and vitals reviewed.    ED Treatments / Results  Labs (all labs ordered are listed, but only abnormal  results are displayed) Labs Reviewed  BASIC METABOLIC PANEL - Abnormal; Notable for the following components:      Result Value   Glucose, Bld 109 (*)    Calcium 8.7 (*)    GFR calc non Af Amer 52 (*)    All other components within normal limits  CBC WITH DIFFERENTIAL/PLATELET - Abnormal; Notable for the following components:   WBC 2.9 (*)    RBC 3.81 (*)    Hemoglobin 11.0 (*)    HCT 35.8 (*)    Platelets 91 (*)    Neutro Abs 1.5 (*)    All other components within normal limits  URINALYSIS,  ROUTINE W REFLEX MICROSCOPIC  HEMOGLOBIN A1C  LIPID PANEL  BASIC METABOLIC PANEL    EKG EKG Interpretation  Date/Time:  Wednesday May 19 2018 19:22:27 EDT Ventricular Rate:  60 PR Interval:    QRS Duration: 145 QT Interval:  506 QTC Calculation: 506 R Axis:   -84 Text Interpretation:  Atrial paced rhythm.  Nonspecific IVCD No STEMI.  Confirmed by Nanda Quinton [phone]) on 05/19/2018 7:28:27 PM   Radiology Ct Head Wo Contrast  Result Date: 05/19/2018 CLINICAL DATA:  84 year old female with altered mental status today. EXAM: CT HEAD WITHOUT CONTRAST TECHNIQUE: Contiguous axial images were obtained from the base of the skull through the vertex without intravenous contrast. COMPARISON:  Head CT 05/26/2012. FINDINGS: Brain: Stable cerebral volume since 2013, normal for age. No midline shift, ventriculomegaly, mass effect, evidence of mass lesion, intracranial hemorrhage or evidence of cortically based acute infarction. Gray-white matter differentiation is normal for age throughout the brain. No cortical encephalomalacia identified. Vascular: Calcified atherosclerosis at the skull base. No suspicious intracranial vascular hyperdensity. Skull: No acute osseous abnormality identified. Sinuses/Orbits: Visualized paranasal sinuses and mastoids are stable and well pneumatized. Other: Stable orbit and scalp soft tissues. IMPRESSION: Normal for age noncontrast CT appearance of the brain.  Electronically Signed   By: Genevie Ann M.D.   On: 05/19/2018 19:40   Dg Chest Portable 1 View  Result Date: 05/18/2018 CLINICAL DATA:  Worsening dyspnea on exertion over the last 2 weeks. EXAM: PORTABLE CHEST 1 VIEW COMPARISON:  Chest x-ray dated August 05, 2013. FINDINGS: Unchanged left chest wall AICD. Stable cardiomediastinal silhouette status post mitral valve replacement. Normal pulmonary vascularity. New small left and trace right pleural effusions. Left greater than right basilar atelectasis. No consolidation or pneumothorax. No acute osseous abnormality. IMPRESSION: 1. New small left and trace right pleural effusions. Electronically Signed   By: Titus Dubin M.D.   On: 05/18/2018 14:58    Procedures Procedures (including critical care time)  Medications Ordered in ED Medications   stroke: mapping our early stages of recovery book (has no administration in time range)  acetaminophen (TYLENOL) tablet 650 mg (has no administration in time range)    Or  acetaminophen (TYLENOL) solution 650 mg (has no administration in time range)    Or  acetaminophen (TYLENOL) suppository 650 mg (has no administration in time range)  apixaban (ELIQUIS) tablet 2.5 mg (has no administration in time range)  albuterol (PROVENTIL HFA;VENTOLIN HFA) 108 (90 Base) MCG/ACT inhaler 2 puff (has no administration in time range)  Calcium Carbonate-Vitamin D 600-400 MG-UNIT 1 tablet (has no administration in time range)  montelukast (SINGULAIR) tablet 10 mg (has no administration in time range)  predniSONE (DELTASONE) tablet 2.5 mg (has no administration in time range)  furosemide (LASIX) tablet 20 mg (has no administration in time range)  metoprolol succinate (TOPROL-XL) 24 hr tablet 25 mg (has no administration in time range)  ICAPS AREDS 2 CHEW (has no administration in time range)     Initial Impression / Assessment and Plan / ED Course  I have reviewed the triage vital signs and the nursing  notes.  Pertinent labs & imaging results that were available during my care of the patient were reviewed by me and considered in my medical decision making (see chart for details).     Patient presents for evaluation for altered mental status.  She is afebrile, intermittently hypertensive in the ED.  She is exhibiting some confusion and short-term memory loss.  No focal neurologic deficits otherwise.  Lab work shows stable neutropenia, no metabolic derangements.  Creatinine within normal limits.  EKG shows paced rhythm, no ischemic changes.  UA shows no evidence of UTI or nephrolithiasis.  Head CT shows no acute intracranial abnormality.  Concern for TIA versus transient global amnesia. Spoke with Dr. Leonel Ramsay with neurology who recommends admission for TIA work-up.  With Dr. Alcario Drought with [facility] hospitalist service who agrees to assume care patient bring her to the hospital for further evaluation and management.  She will be transferred to [facility].  Patient seen and evaluated by Dr. Laverta Baltimore who agrees with assessment and plan at this time.   Final Clinical Impressions(s) / ED Diagnoses   Final diagnoses:  TIA (transient ischemic attack)    ED Discharge Orders    None       Renita Papa, PA-C 05/19/18 2231    Margette Fast, MD 05/20/18 [phone]

## 2018-05-19 NOTE — H&P (Signed)
History and Physical    Nancy Bush [place] HDQ:[id number] DOB: 04/01/1926 DOA: 05/19/2018  PCP: Lawerance Cruel, MD  Patient coming from: Home  I have personally briefly reviewed patient's old medical records in [place]  Chief Complaint: AMS  HPI: Nancy Bush is a 84 y.o. female with medical history significant of asthma, A.Fib on eliquis, CHF, MVR with bioprosthetic, RA.  Patient was just seen in ED yesterday for SOB, found to have mild CHF exacerbation.  Lasix dose increased to 20mg  daily for next 5 days and was to follow up in office.  Patient's niece, who interacts with her on a daily basis states that she was in her usual state of health at around 10:30 AM this morning when she and her husband went to visit her.  She states that they took her trash out and went to the pharmacy to pick up her Eliquis refill.  The patient called them at around 3:30 PM stating that she was having difficulty remembering the events of this morning.  She states that she felt like she could not think clearly.  Patient's niece states that she was not answering questions and could not remember anything that she had told her 5 minutes ago.  She could not remember why she was in the hospital yesterday or that she was given in the hospital.  Niece also notes that the patient has been quite fatigued over the past 2 weeks and has been losing weight.  Her PCP has been evaluating her for this.  ED Course: CT head neg.  Dr. Leonel Ramsay coming to evaluate, hospitalist asked to admit.   Review of Systems: As per HPI otherwise 10 point review of systems negative.   Past Medical History:  Diagnosis Date  . Asthma   . Atrial fibrillation ([place])   . CHF (congestive heart failure) ([place])   . COPD (chronic obstructive pulmonary disease) ([place])   . H/O: hysterectomy   . Hypertension   . LBBB (left bundle branch block)    per discharge note 2003  . Mitral regurgitation    MILD  . MVP (mitral valve  prolapse)   . Pacemaker   . Pulmonary hypertension ([place])    MILD TO MODERATE BY ECHO  . PVC (premature ventricular contraction)   . Rheumatoid arthritis(714.0)   . S/P Maze operation for atrial fibrillation 05/18/2012   Complete biatrial lesion set using cryothermy with oversewing of LA appendage  . S/P mitral valve replacement with bioprosthetic valve 05/18/2012   22mm Edwards Magna Mitral pericardial bioprosthesis     Past Surgical History:  Procedure Laterality Date  . ABDOMINAL HYSTERECTOMY    . APPENDECTOMY    . CATARACT EXTRACTION    . CHOLECYSTECTOMY    . hysterectomy    . MAZE  05/18/2012   Procedure: MAZE;  Surgeon: Rexene Alberts, MD;  Location: [facility];  Service: Open Heart Surgery;  Laterality: Right;  . MITRAL VALVE REPLACEMENT  05/18/2012   Procedure: MINIMALLY INVASIVE MITRAL VALVE (MV) REPLACEMENT;  Surgeon: Rexene Alberts, MD;  Location: [facility];  Service: Open Heart Surgery;  Laterality: N/A;  . OVARIAN CYST REMOVAL    . PACEMAKER INSERTION    . PERMANENT PACEMAKER INSERTION N/A 05/21/2012   Procedure: PERMANENT PACEMAKER INSERTION;  Surgeon: Evans Lance, MD;  Location: [facility] CATH LAB;  Service: Cardiovascular;  Laterality: N/A;  . TEE WITHOUT CARDIOVERSION  02/09/2012   Procedure: TRANSESOPHAGEAL ECHOCARDIOGRAM (TEE);  Surgeon: Fay Records, MD;  Location:  [facility] ENDOSCOPY;  Service: Cardiovascular;  Laterality: N/A;     reports that she has never smoked. She has never used smokeless tobacco. She reports that she does not drink alcohol or use drugs.  Allergies  Allergen Reactions  . Promethazine Hcl Other (See Comments)    unknown  . Sulfonamide Derivatives Other (See Comments)    unknown  . Tequin Other (See Comments)    hallucinations     No family history on file.   Prior to Admission medications   Medication Sig Start Date End Date Taking? Authorizing Provider  albuterol (PROVENTIL HFA;VENTOLIN HFA) 108 (90 Base) MCG/ACT inhaler Inhale 2 puffs into the  lungs every 6 (six) hours as needed for wheezing or shortness of breath.   Yes [provider]  Calcium Carbonate-Vitamin D (CALTRATE 600+D) 600-400 MG-UNIT per tablet Take 1 tablet by mouth daily.    Yes [provider]  ELIQUIS 2.5 MG TABS tablet TAKE 1 TABLET BY MOUTH TWICE A DAY Patient taking differently: Take 2.5 mg by mouth 2 (two) times daily.  01/15/18  Yes Hochrein, Jeneen Rinks, MD  furosemide (LASIX) 20 MG tablet TAKE 1 TABLET BY MOUTH ON MONDAY, WEDNESDAY, AND FRIDAY ONLY Patient taking differently: Take 20 mg by mouth daily.  11/05/17  Yes Minus Breeding, MD  metoprolol succinate (TOPROL-XL) 25 MG 24 hr tablet TAKE 1 TABLET BY MOUTH TWICE A DAY Patient taking differently: Take 25 mg by mouth daily.  04/01/18  Yes Minus Breeding, MD  montelukast (SINGULAIR) 10 MG tablet Take 10 mg by mouth at bedtime.  12/31/12  Yes [provider]  Multiple Vitamins-Minerals (ICAPS AREDS 2 PO) Take 2 tablets by mouth daily.    Yes [provider]  nitroGLYCERIN (NITROSTAT) 0.4 MG SL tablet Place 0.4 mg under the tongue every 5 (five) minutes as needed for chest pain (x 3 tabs daily).   Yes [provider]  predniSONE (DELTASONE) 5 MG tablet Take 2.5 mg by mouth every other day.    Yes [provider]    Physical Exam: Vitals:   05/19/18 2000 05/19/18 2026 05/19/18 2100 05/19/18 2200  BP: (!) 142/66  130/64 137/64  Pulse:    63  Resp: 13  (!) 23 20  Temp:      TempSrc:      SpO2:    98%  Weight:  46.4 kg    Height:  4\' 11"  (1.499 m)      Constitutional: NAD, calm, comfortable Eyes: PERRL, lids and conjunctivae normal ENMT: Mucous membranes are moist. Posterior pharynx clear of any exudate or lesions.Normal dentition.  Neck: normal, supple, no masses, no thyromegaly Respiratory: clear to auscultation bilaterally, no wheezing, no crackles. Normal respiratory effort. No accessory muscle use.  Cardiovascular: Regular rate and rhythm, no murmurs /  rubs / gallops. No extremity edema. 2+ pedal pulses. No carotid bruits.  Abdomen: no tenderness, no masses palpated. No hepatosplenomegaly. Bowel sounds positive.  Musculoskeletal: no clubbing / cyanosis. No joint deformity upper and lower extremities. Good ROM, no contractures. Normal muscle tone.  Skin: no rashes, lesions, ulcers. No induration Neurologic: CN 2-12 grossly intact. Sensation intact, DTR normal. Strength 5/5 in all 4.  Psychiatric: Normal judgment and insight. Alert and oriented x 3. Normal mood.    Labs on Admission: I have personally reviewed following labs and imaging studies  CBC: Recent Labs  Lab 05/18/18 1520 05/19/18 1927  WBC 3.2* 2.9*  NEUTROABS 1.7 1.5*  HGB 10.3* 11.0*  HCT 33.3* 35.8*  MCV 94.1 94.0  PLT 83* 91*   Basic Metabolic Panel: Recent Labs  Lab 05/18/18 1520 05/19/18 1927  NA 136 139  K 3.6 4.0  CL 105 104  CO2 23 26  GLUCOSE 103* 109*  BUN 11 14  CREATININE 1.04* 0.94  CALCIUM 8.3* 8.7*  MG 2.0  --    GFR: Estimated Creatinine Clearance: 27.7 mL/min (by C-G formula based on SCr of 0.94 mg/dL). Liver Function Tests: Recent Labs  Lab 05/18/18 1520  AST 25  ALT 11  ALKPHOS 54  BILITOT 0.9  PROT 6.4*  ALBUMIN 3.7   No results for input(s): LIPASE, AMYLASE in the last 168 hours. No results for input(s): AMMONIA in the last 168 hours. Coagulation Profile: No results for input(s): INR, PROTIME in the last 168 hours. Cardiac Enzymes: No results for input(s): CKTOTAL, CKMB, CKMBINDEX, TROPONINI in the last 168 hours. BNP (last 3 results) No results for input(s): PROBNP in the last 8760 hours. HbA1C: No results for input(s): HGBA1C in the last 72 hours. CBG: No results for input(s): GLUCAP in the last 168 hours. Lipid Profile: No results for input(s): CHOL, HDL, LDLCALC, TRIG, CHOLHDL, LDLDIRECT in the last 72 hours. Thyroid Function Tests: No results for input(s): TSH, T4TOTAL, FREET4, T3FREE, THYROIDAB in the last 72  hours. Anemia Panel: No results for input(s): VITAMINB12, FOLATE, FERRITIN, TIBC, IRON, RETICCTPCT in the last 72 hours. Urine analysis:    Component Value Date/Time   COLORURINE YELLOW 05/19/2018 [place] 05/19/2018 1854   LABSPEC 1.008 05/19/2018 1854   PHURINE 6.0 05/19/2018 1854   GLUCOSEU NEGATIVE 05/19/2018 1854   HGBUR NEGATIVE 05/19/2018 [place] 05/19/2018 [place] 05/19/2018 1854   PROTEINUR NEGATIVE 05/19/2018 1854   UROBILINOGEN 0.2 08/21/2012 1645   NITRITE NEGATIVE 05/19/2018 1854   LEUKOCYTESUR NEGATIVE 05/19/2018 1854    Radiological Exams on Admission: Ct Head Wo Contrast  Result Date: 05/19/2018 CLINICAL DATA:  84 year old female with altered mental status today. EXAM: CT HEAD WITHOUT CONTRAST TECHNIQUE: Contiguous axial images were obtained from the base of the skull through the vertex without intravenous contrast. COMPARISON:  Head CT 05/26/2012. FINDINGS: Brain: Stable cerebral volume since 2013, normal for age. No midline shift, ventriculomegaly, mass effect, evidence of mass lesion, intracranial hemorrhage or evidence of cortically based acute infarction. Gray-white matter differentiation is normal for age throughout the brain. No cortical encephalomalacia identified. Vascular: Calcified atherosclerosis at the skull base. No suspicious intracranial vascular hyperdensity. Skull: No acute osseous abnormality identified. Sinuses/Orbits: Visualized paranasal sinuses and mastoids are stable and well pneumatized. Other: Stable orbit and scalp soft tissues. IMPRESSION: Normal for age noncontrast CT appearance of the brain. Electronically Signed   By: Genevie Ann M.D.   On: 05/19/2018 19:40   Dg Chest Portable 1 View  Result Date: 05/18/2018 CLINICAL DATA:  Worsening dyspnea on exertion over the last 2 weeks. EXAM: PORTABLE CHEST 1 VIEW COMPARISON:  Chest x-ray dated August 05, 2013. FINDINGS: Unchanged left chest wall AICD.  Stable cardiomediastinal silhouette status post mitral valve replacement. Normal pulmonary vascularity. New small left and trace right pleural effusions. Left greater than right basilar atelectasis. No consolidation or pneumothorax. No acute osseous abnormality. IMPRESSION: 1. New small left and trace right pleural effusions. Electronically Signed   By: Titus Dubin M.D.   On: 05/18/2018 14:58    EKG: Independently reviewed.  Assessment/Plan Principal Problem:   TGA (transient global amnesia) Active Problems:   Acute on chronic  diastolic heart failure (HCC)   A-fib (HCC)   S/P mitral valve replacement with bioprosthetic valve   Pacemaker-[employer]    1. TGA - 1. Still having confusion in ED 2. Stroke pathway and work up just-in-case 3. And it looked like cards wanted a 2d echo in the next few days on patient anyhow due to the CHF exacerbation. 4. Dr. Leonel Ramsay evaluating patient now 5. Wont be able to obtain MRI brain due to PPM 6. PT/OT/SLP 2. Acute on chronic diastolic CHF - 1. Lasix 20mg  PO daily for next 5 days then back to 20mg  PO QOD. 2. Will get repeat CXR this evening 3. 2d echo ordered as above 4. Tele monitor 5. Intake and output 6. BMP tomorrow AM 3. A.Fib - 1. Continue metoprolol 2. Continue eliquis 4. Chronic prednisone use - 1. Continue QOD prednisone that she uses chronically for h/o prior prednisone use in past 5. H/o Asthma - 1. Continue singulair 2. Albuterol PRN if needed  DVT prophylaxis: Eliquis Code Status: Full Family Communication: No family in room Disposition Plan: Home after admit Consults called: Dr. Leonel Ramsay evaluating patient now Admission status: Place in [place], [place] [facility] Pager [phone] Only works nights!  If 7AM-7PM, please contact the primary day team physician taking care of patient  [email or web address] Password [email or web address]  05/19/2018, 10:11 PM

## 2018-05-19 NOTE — ED Notes (Signed)
Bed: RE32 Expected date:  Expected time:  Means of arrival:  Comments: 84 yo confusion

## 2018-05-19 NOTE — ED Notes (Signed)
Patient ambulated to BR with minimal assistance; tolerated well.

## 2018-05-19 NOTE — ED Triage Notes (Signed)
Per EMS:  Pt is coming from the back seat of her niece's car. Pt's niece pulled up to the fire department with her in the car and told the fire department to call 911.  Pt reportedly forgot something this morning when she called her niece.   Pt reported to EMS that she just did not know the exact date because she does not work anymore. Pt was alert to self, place, situation, and year.

## 2018-05-19 NOTE — ED Notes (Signed)
Pt transported to CT, will obtain bloodwork and EKG after scan

## 2018-05-19 NOTE — Consult Note (Addendum)
Neurology Consultation Reason for Consult: Confusion Referring Physician: Sheran Luz  CC: memory loss  History is obtained from:patient  HPI: Nancy Bush is a 84 y.o. female with a history of afib, pacemaker placement, hypertension who presents with an episode of confusion earlier today.  She states that she remembers waking up normally, ate a granola bar for breakfast around 10:30 AM.  She then has a period of amnesia, though is not certain of the exact onset time.  She apparently called her niece in the early afternoon and stated that she was having trouble "thinking."  Her niece states that she asked her about some things they had done that morning together, the patient had no recollection of them.  She subsequently on up in the patient was supposed to call her again in a few minutes to check again.  When the patient did not call back, her niece called her and had no recollection of the events of the morning, or if talking to her earlier.  The niece went to go get her and they took her to a fire station where the fire department called 911.  The neice states that she seemed relatively intact, was making sense, but was just not remembering any of the things that they done earlier.  When she would ask her what had been done this morning, the patient would take a long time to answer, but not clearly due to having trouble with her speech but rather like she was try to think of an answer.  She denied any numbness, weakness, or other deficits during the episode.  She currently has significantly improved, she remembers things that happened since arriving in the ED.  Due to concern with her living alone and the fact that there may be some still subtle problem she was admitted for observation.  Of note, the patient has been short of breath several times over the past few days.  In fact she came to the hospital yesterday with this.  ROS: A 14 point ROS was performed and is negative except as noted in  the HPI.   Past Medical History:  Diagnosis Date  . Asthma   . Atrial fibrillation ([place])   . CHF (congestive heart failure) ([place])   . COPD (chronic obstructive pulmonary disease) ([place])   . H/O: hysterectomy   . Hypertension   . LBBB (left bundle branch block)    per discharge note 2003  . Mitral regurgitation    MILD  . MVP (mitral valve prolapse)   . Pacemaker   . Pulmonary hypertension ([place])    MILD TO MODERATE BY ECHO  . PVC (premature ventricular contraction)   . Rheumatoid arthritis(714.0)   . S/P Maze operation for atrial fibrillation 05/18/2012   Complete biatrial lesion set using cryothermy with oversewing of LA appendage  . S/P mitral valve replacement with bioprosthetic valve 05/18/2012   25mm Edwards Magna Mitral pericardial bioprosthesis      Family history: No history of similar   Social History:  reports that she has never smoked. She has never used smokeless tobacco. She reports that she does not drink alcohol or use drugs.   Exam: Current vital signs: BP 137/64   Pulse 63   Temp 97.8 F (36.6 C) (Oral)   Resp 20   Ht 4\' 11"  (1.499 m)   Wt 46.4 kg   SpO2 98%   BMI 20.66 kg/m  Vital signs in last 24 hours: Temp:  [97.8 F (36.6 C)] 97.8 F (  36.6 C) (10/16 1806) Pulse Rate:  [55-66] 63 (10/16 2200) Resp:  [13-23] 20 (10/16 2200) BP: (130-155)/(64-69) 137/64 (10/16 2200) SpO2:  [92 %-100 %] 98 % (10/16 2200) Weight:  [46.4 kg] 46.4 kg (10/16 2026)   Physical Exam  Constitutional: Appears well-developed and well-nourished.  Psych: Affect appropriate to situation Eyes: No scleral injection HENT: No OP obstrucion Head: Normocephalic.  Cardiovascular: Normal rate and regular rhythm.  Respiratory: Effort normal, non-labored breathing GI: Soft.  No distension. There is no tenderness.  Skin: WDI  Neuro: Mental Status: Patient is awake, alert, oriented to person, place, month, year, and situation. Patient is able to give a clear and coherent  history. No signs of aphasia or neglect Cranial Nerves: II: Visual Fields are full. Pupils are equal, round, and reactive to light.   III,IV, VI: EOMI without ptosis or diploplia.  V: Facial sensation is symmetric to temperature VII: Facial movement is symmetric.  VIII: hearing is intact to voice X: Uvula elevates symmetrically XI: Shoulder shrug is symmetric. XII: tongue is midline without atrophy or fasciculations.  Motor: Tone is normal. Bulk is normal. 5/5 strength was present in all four extremities.  Sensory: Sensation is symmetric to light touch and temperature in the arms and legs. Cerebellar: FNF and HKS are intact bilaterally      I have reviewed labs in epic and the results pertinent to this consultation are: BMP-unremarkable  I have reviewed the images obtained: CT head-unremarkable  Impression: 84 year old female with transient difficulty with memories.  The description sounds much more consistent with transient global amnesia than either seizure or stroke.  She cannot have an MRI, but I do think it be reasonable to do an EEG.  If this is negative and the patient is at baseline I would favor treating this as transient global amnesia.   Recommendations: 1) EEG 2) if this is negative and the patient is at baseline, no further work-up from a neurological perspective.   Roland Rack, MD [facility] [phone]  If 7pm- 7am, please page neurology on call as listed in [place].

## 2018-05-19 NOTE — ED Notes (Signed)
Patient transported to CT 

## 2018-05-19 NOTE — Progress Notes (Deleted)
HPI  The patient presents for followup of mitral valve replacement. She had a bioprosthetic mitral valve and a Maze procedure in 05/2012.  She's also had pacemaker placement for bradycardia. She's had paroxysmal atrial fibrillation/flutter.  She has had increased dyspnea and called to be added to the schedule.  She actually presented to the ED yesterday for SOB.  I reviewed these records for this visit.    She had a mildly increased BNP and mild pleural effusions but was not felt to be acutely toxic.  She was told to take her Lasix every day as she is only been taking it 3 times per week.***     On echo last June she had a normal MVR.  She did have moderately elevated pulmonary pressures.     Since I last saw her her big complaint has been fatigue.  She denies any acute cardiovascular symptoms.  She gets around slowly with a cane.  She is able to live by herself and do her household chores.  With this she denies any chest pressure, neck or arm discomfort.  She is had no new shortness of breath, PND or orthopnea.  She will once in a while notice palpitations but has had no presyncope or syncope.  Allergies  Allergen Reactions  . Promethazine Hcl Other (See Comments)    unknown  . Sulfonamide Derivatives Other (See Comments)    unknown  . Tequin Other (See Comments)    hallucinations     Current Outpatient Medications  Medication Sig Dispense Refill  . albuterol (PROVENTIL HFA;VENTOLIN HFA) 108 (90 Base) MCG/ACT inhaler Inhale 2 puffs into the lungs every 6 (six) hours as needed for wheezing or shortness of breath.    Marland Kitchen amoxicillin (AMOXIL) 500 MG capsule Take 2 grams ( 4 tablets ) 1 hour before dental work 4 capsule 6  . amoxicillin (AMOXIL) 500 MG capsule TAKE 2 GRAMS ( 4 TABLETS ) 1 HOUR BEFORE DENTAL WORK (Patient not taking: Reported on 05/18/2018) 4 capsule 1  . Calcium Carbonate-Vitamin D (CALTRATE 600+D) 600-400 MG-UNIT per tablet Take 1 tablet by mouth daily.     Marland Kitchen ELIQUIS 2.5  MG TABS tablet TAKE 1 TABLET BY MOUTH TWICE A DAY (Patient taking differently: Take 2.5 mg by mouth 2 (two) times daily. ) 180 tablet 1  . furosemide (LASIX) 20 MG tablet TAKE 1 TABLET BY MOUTH ON MONDAY, WEDNESDAY, AND FRIDAY ONLY (Patient taking differently: Take 20 mg by mouth every Monday, Wednesday, and Friday. ) 15 tablet 6  . metoprolol succinate (TOPROL-XL) 25 MG 24 hr tablet TAKE 1 TABLET BY MOUTH TWICE A DAY (Patient taking differently: Take 25 mg by mouth daily. ) 180 tablet 0  . montelukast (SINGULAIR) 10 MG tablet Take 10 mg by mouth at bedtime.     . Multiple Vitamins-Minerals (ICAPS AREDS 2 PO) Take 2 tablets by mouth daily.     . nitroGLYCERIN (NITROSTAT) 0.4 MG SL tablet Place 0.4 mg under the tongue every 5 (five) minutes as needed for chest pain (x 3 tabs daily).    . predniSONE (DELTASONE) 5 MG tablet Take 2.5 mg by mouth every other day.      No current facility-administered medications for this visit.     Past Medical History:  Diagnosis Date  . Asthma   . Atrial fibrillation ([place])   . CHF (congestive heart failure) ([place])   . COPD (chronic obstructive pulmonary disease) ([place])   . H/O: hysterectomy   . Hypertension   .  LBBB (left bundle branch block)    per discharge note 2003  . Mitral regurgitation    MILD  . MVP (mitral valve prolapse)   . Pacemaker   . Pulmonary hypertension ([place])    MILD TO MODERATE BY ECHO  . PVC (premature ventricular contraction)   . Rheumatoid arthritis(714.0)   . S/P Maze operation for atrial fibrillation 05/18/2012   Complete biatrial lesion set using cryothermy with oversewing of LA appendage  . S/P mitral valve replacement with bioprosthetic valve 05/18/2012   13mm Edwards Magna Mitral pericardial bioprosthesis     Past Surgical History:  Procedure Laterality Date  . ABDOMINAL HYSTERECTOMY    . APPENDECTOMY    . CATARACT EXTRACTION    . CHOLECYSTECTOMY    . hysterectomy    . MAZE  05/18/2012   Procedure: MAZE;  Surgeon:  Rexene Alberts, MD;  Location: [place];  Service: Open Heart Surgery;  Laterality: Right;  . MITRAL VALVE REPLACEMENT  05/18/2012   Procedure: MINIMALLY INVASIVE MITRAL VALVE (MV) REPLACEMENT;  Surgeon: Rexene Alberts, MD;  Location: [place];  Service: Open Heart Surgery;  Laterality: N/A;  . OVARIAN CYST REMOVAL    . PACEMAKER INSERTION    . PERMANENT PACEMAKER INSERTION N/A 05/21/2012   Procedure: PERMANENT PACEMAKER INSERTION;  Surgeon: Evans Lance, MD;  Location: [facility] CATH LAB;  Service: Cardiovascular;  Laterality: N/A;  . TEE WITHOUT CARDIOVERSION  02/09/2012   Procedure: TRANSESOPHAGEAL ECHOCARDIOGRAM (TEE);  Surgeon: Fay Records, MD;  Location: [facility] ENDOSCOPY;  Service: Cardiovascular;  Laterality: N/A;   ROS: ***  PHYSICAL EXAM There were no vitals taken for this visit.  GENERAL:  Well appearing NECK:  No jugular venous distention, waveform within normal limits, carotid upstroke brisk and symmetric, no bruits, no thyromegaly LUNGS:  Clear to auscultation bilaterally CHEST: Well-healed pacemaker pocket and surgical scar. HEART:  PMI not displaced or sustained,S1 and S2 within normal limits, no S3, no S4, no clicks, no rubs, *** murmurs ABD:  Flat, positive bowel sounds normal in frequency in pitch, no bruits, no rebound, no guarding, no midline pulsatile mass, no hepatomegaly, no splenomegaly EXT:  2 plus pulses throughout, no edema, no cyanosis no clubbing   ***GENERAL: Frail appearing.  NECK:  Positive  jugular venous distention, CV wave, waveform within normal limits, carotid upstroke brisk and symmetric, no bruits, no thyromegaly LUNGS:  Clear to auscultation bilaterally CHEST:  Well healed pacer pocket.  HEART:  PMI not displaced or sustained,S1 and S2 within normal limits, no S3, no S4, no clicks, no rubs, 2 out of 6 holosystolic murmur murmurs at the lower left sternal border, no diastolic murmurs. ABD:  Flat, positive bowel sounds normal in frequency in pitch, no bruits, no  rebound, no guarding, no midline pulsatile mass, no hepatomegaly, no splenomegaly EXT:  2 plus pulses throughout, no edema, no cyanosis no clubbing   EKG:  ***  Underlying atrial fib with ventricular pacing 100% rate 60.    ASSESSMENT AND PLAN   MVR:  Her echocardiogram in June 2018 demonstrated stable bioprosthesis.  *** No further imaging at this point.   ATRIAL FIB/FLUTTER:    She tolerates rate control and anticoagulation. Nancy Bush has a CHA2DS2 - VASc score of 4 with a risk of stroke of 4%. ***   No change in therapy.   FATIGUE:   This has been chronic.  *** I will check  CBC and TSH  BRADYCARDIA:  History of pacemaker placement.  She is up to date with follow up. I reviewed the readings from April.  ***    I reviewed a recent note from Dr. Lovena Le.    TR:  She had moderately elevated pulmonary pressures.  *** However, I think this is best managed medically.  She has no overt symptoms and is otherwise frail.  No change in therapy.      HTN:  BP at home is *** well controlled.  No change in therapy.

## 2018-05-20 ENCOUNTER — Ambulatory Visit: Payer: Medicare Other | Admitting: Cardiology

## 2018-05-20 ENCOUNTER — Observation Stay (HOSPITAL_COMMUNITY)
Admit: 2018-05-20 | Discharge: 2018-05-20 | Disposition: A | Discharge status: Home / Self Care | Payer: Medicare Other | Attending: Internal Medicine | Admitting: Internal Medicine

## 2018-05-20 ENCOUNTER — Observation Stay (HOSPITAL_BASED_OUTPATIENT_CLINIC_OR_DEPARTMENT_OTHER): Payer: Medicare Other

## 2018-05-20 ENCOUNTER — Telehealth: Payer: Self-pay | Admitting: Cardiology

## 2018-05-20 DIAGNOSIS — D61818 Other pancytopenia: Secondary | ICD-10-CM

## 2018-05-20 DIAGNOSIS — I5033 Acute on chronic diastolic (congestive) heart failure: Secondary | ICD-10-CM | POA: Diagnosis not present

## 2018-05-20 DIAGNOSIS — G454 Transient global amnesia: Secondary | ICD-10-CM | POA: Diagnosis not present

## 2018-05-20 LAB — HEMOGLOBIN A1C
Hgb A1c MFr Bld: 5.8 % — ABNORMAL HIGH (ref 4.8–5.6)
Mean Plasma Glucose: 119.76 mg/dL

## 2018-05-20 LAB — BASIC METABOLIC PANEL
Anion gap: 8 (ref 5–15)
BUN: 33 mg/dL — ABNORMAL HIGH (ref 8–23)
CHLORIDE: 107 mmol/L (ref 98–111)
CO2: 26 mmol/L (ref 22–32)
CREATININE: 1.44 mg/dL — AB (ref 0.44–1.00)
Calcium: 8.6 mg/dL — ABNORMAL LOW (ref 8.9–10.3)
GFR calc Af Amer: 36 mL/min — ABNORMAL LOW (ref >60)
GFR calc non Af Amer: 31 mL/min — ABNORMAL LOW (ref >60)
GLUCOSE: 117 mg/dL — AB (ref 70–99)
Potassium: 4.1 mmol/L (ref 3.5–5.1)
Sodium: 141 mmol/L (ref 135–145)

## 2018-05-20 LAB — LIPID PANEL
CHOL/HDL RATIO: 2.5 ratio
Cholesterol: 98 mg/dL (ref 0–200)
HDL: 39 mg/dL — AB (ref >40)
LDL Cholesterol: 48 mg/dL (ref 0–99)
Triglycerides: 54 mg/dL (ref <150)
VLDL: 11 mg/dL (ref 0–40)

## 2018-05-20 LAB — ECHOCARDIOGRAM COMPLETE
HEIGHTINCHES: 59 in
WEIGHTICAEL: 1813.06 [oz_av]

## 2018-05-20 MED ORDER — FUROSEMIDE 20 MG PO TABS
20.0000 mg | ORAL_TABLET | Freq: Every day | ORAL | Status: DC
  Administered 2018-05-20: 20 mg via ORAL
  Filled 2018-05-20: qty 1

## 2018-05-20 MED ORDER — FUROSEMIDE 40 MG PO TABS: 40.0000 mg | ORAL_TABLET | Freq: Every day | ORAL | Status: DC

## 2018-05-20 NOTE — Progress Notes (Signed)
PROGRESS NOTE                                                                                                                                                                                                             Patient Demographics:    Nancy Bush, is a 84 y.o. female, DOB - [birth year], ZGY:[id number]  Admit date - 05/19/2018   Admitting Physician Etta Quill, DO  Outpatient Primary MD for the patient is Lawerance Cruel, MD  LOS - 0  Outpatient Specialists: Cardiology ( Dr Rodolph Bong)  Chief Complaint  Patient presents with  . Altered Mental Status       Brief Narrative   84 year old female with history of asthma, A. fib on Eliquis, diastolic CHF, MVR with bioprosthetic valve and rheumatoid arthritis was seen in the ED the previous day with shortness of breath and found to have mild CHF exacerbation.  Her Lasix dose was increased to 20 mg daily for next 5 days with outpatient follow-up (patient was taking 20 mg every other day).  As per niece who interacts with her daily reported that patient was in her normal state of health in the morning of admission when they visited her but during the afternoon when the patient called them she was having difficulty remembering the events of the morning and was not able to think clearly.  Per the niece she was also not answering questions appropriately and could not remember anything she had told her a few minutes ago.  She also did not remember coming to the hospital the previous day.  Per her niece patient has been fatigued over the past 2 weeks. Vitals on presentation was normal.  Labs showed WC of 2.9, hemoglobin of 11 and platelets of 91.  Patient placed on observation for transient global amnesia.  Neurology consulted.  Subjective:   Patient seen and examined this morning.  She appears oriented today.  Reports still having some shortness of breath.   Assessment  &  Plan :    Principal Problem:   TGA (transient global amnesia) Etiology unclear at this time.  Head CT unremarkable for acute findings.  Cannot get MRI due to pacemaker.  Seen by neurology and recommends EEG.  (Results pending).  2D echo shows normal LV function with EF of 60-65% and no wall motion normality.  Carotid  Doppler without significant stenosis.  Seen by PT and recommends home health.   Active Problems:   Acute on chronic diastolic heart failure (HCC) 2D echo with normal EF but increased RV pressure with moderately dilated RV and severe TR along with severe pulmonary hypertension (71 mmHg). Received increased dose of Lasix upon admission.  Creatinine worsened in a.m. lab so would not increase dose further.  If symptoms persistent or worsen will need cardiology evaluation.  Proximal A. fib Rate controlled.  Continue Eliquis and metoprolol.  Chronic asthma Continue Singulair and albuterol inhaler  Pancytopenia No clear etiology.  Cannot rule out acute viral illness.  Will monitor in a.m.     Code Status : Full code  Family Communication  : None at bedside  Disposition Plan  : Home in a.m. pending EEG results  Barriers For Discharge : Active symptoms  Consults  : Neurology  Procedures: Head CT, 2D echo, carotid Doppler and EEG  DVT Prophylaxis  : On Eliquis  Lab Results  Component Value Date   PLT 91 (L) 05/19/2018    Antibiotics  :    Anti-infectives (From admission, onward)   None        Objective:   Vitals:   05/20/18 0600 05/20/18 0845 05/20/18 0900 05/20/18 1200  BP:   121/65 119/64  Pulse:   63 60  Resp:   18 18  Temp:   97.6 F (36.4 C) 97.6 F (36.4 C)  TempSrc:   Oral Oral  SpO2:  97% 98% 97%  Weight: 51.4 kg     Height:        Wt Readings from Last 3 Encounters:  05/20/18 51.4 kg  05/18/18 46.4 kg  12/11/17 46.4 kg     Intake/Output Summary (Last 24 hours) at 05/20/2018 1803 Last data filed at 05/20/2018 1600 Gross per 24 hour   Intake 480 ml  Output -  Net 480 ml     Physical Exam  Gen: not in distress HEENT: no pallor, moist mucosa, supple neck Chest: clear b/l, no added sounds CVS: S1-S2 regular, no murmurs GI: soft, NT, ND, BS+ Musculoskeletal: warm, no edema CNS: Alert and oriented, nonfocal    Data Review:    CBC Recent Labs  Lab 05/18/18 1520 05/19/18 1927  WBC 3.2* 2.9*  HGB 10.3* 11.0*  HCT 33.3* 35.8*  PLT 83* 91*  MCV 94.1 94.0  MCH 29.1 28.9  MCHC 30.9 30.7  RDW 14.5 14.6  LYMPHSABS 0.7 0.7  MONOABS 0.7 0.6  EOSABS 0.1 0.1  BASOSABS 0.0 0.0    Chemistries  Recent Labs  Lab 05/18/18 1520 05/19/18 1927 05/20/18 0417  NA 136 139 141  K 3.6 4.0 4.1  CL 105 104 107  CO2 23 26 26   GLUCOSE 103* 109* 117*  BUN 11 14 33*  CREATININE 1.04* 0.94 1.44*  CALCIUM 8.3* 8.7* 8.6*  MG 2.0  --   --   AST 25  --   --   ALT 11  --   --   ALKPHOS 54  --   --   BILITOT 0.9  --   --    ------------------------------------------------------------------------------------------------------------------ Recent Labs    05/20/18 0417  CHOL 98  HDL 39*  LDLCALC 48  TRIG 54  CHOLHDL 2.5    Lab Results  Component Value Date   HGBA1C 5.8 (H) 05/20/2018   ------------------------------------------------------------------------------------------------------------------ No results for input(s): TSH, T4TOTAL, T3FREE, THYROIDAB in the last 72 hours.  Invalid input(s): FREET3 ------------------------------------------------------------------------------------------------------------------ No  results for input(s): VITAMINB12, FOLATE, FERRITIN, TIBC, IRON, RETICCTPCT in the last 72 hours.  Coagulation profile No results for input(s): INR, PROTIME in the last 168 hours.  No results for input(s): DDIMER in the last 72 hours.  Cardiac Enzymes No results for input(s): CKMB, TROPONINI, MYOGLOBIN in the last 168 hours.  Invalid input(s):  CK ------------------------------------------------------------------------------------------------------------------    Component Value Date/Time   BNP 508.0 (H) 05/18/2018 1520    Inpatient Medications  Scheduled Meds: .  stroke: mapping our early stages of recovery book   Does not apply Once  . apixaban  2.5 mg Oral BID  . calcium-vitamin D  1 tablet Oral Daily  . furosemide  20 mg Oral Daily  . Influenza vac split quadrivalent PF  0.5 mL Intramuscular Tomorrow-1000  . metoprolol succinate  25 mg Oral Daily  . montelukast  10 mg Oral QHS  . multivitamin   Oral Daily  . predniSONE  2.5 mg Oral QODAY   Continuous Infusions: PRN Meds:.acetaminophen **OR** acetaminophen (TYLENOL) oral liquid 160 mg/5 mL **OR** acetaminophen, albuterol  Micro Results No results found for this or any previous visit (from the past 240 hour(s)).  Radiology Reports Dg Chest 2 View  Result Date: 05/19/2018 CLINICAL DATA:  Altered mental status EXAM: CHEST - 2 VIEW COMPARISON:  05/18/2018, 08/05/2013 FINDINGS: Left-sided pacing device and valve prosthesis. Tiny pleural effusions or pleural thickening. Cardiomegaly with aortic atherosclerosis. No pneumothorax. No focal consolidation. IMPRESSION: 1. Trace pleural effusions or pleural thickening 2. Cardiomegaly Electronically Signed   By: Donavan Foil M.D.   On: 05/19/2018 22:30   Ct Head Wo Contrast  Result Date: 05/19/2018 CLINICAL DATA:  84 year old female with altered mental status today. EXAM: CT HEAD WITHOUT CONTRAST TECHNIQUE: Contiguous axial images were obtained from the base of the skull through the vertex without intravenous contrast. COMPARISON:  Head CT 05/26/2012. FINDINGS: Brain: Stable cerebral volume since 2013, normal for age. No midline shift, ventriculomegaly, mass effect, evidence of mass lesion, intracranial hemorrhage or evidence of cortically based acute infarction. Gray-white matter differentiation is normal for age throughout the  brain. No cortical encephalomalacia identified. Vascular: Calcified atherosclerosis at the skull base. No suspicious intracranial vascular hyperdensity. Skull: No acute osseous abnormality identified. Sinuses/Orbits: Visualized paranasal sinuses and mastoids are stable and well pneumatized. Other: Stable orbit and scalp soft tissues. IMPRESSION: Normal for age noncontrast CT appearance of the brain. Electronically Signed   By: Genevie Ann M.D.   On: 05/19/2018 19:40   Dg Chest Portable 1 View  Result Date: 05/18/2018 CLINICAL DATA:  Worsening dyspnea on exertion over the last 2 weeks. EXAM: PORTABLE CHEST 1 VIEW COMPARISON:  Chest x-ray dated August 05, 2013. FINDINGS: Unchanged left chest wall AICD. Stable cardiomediastinal silhouette status post mitral valve replacement. Normal pulmonary vascularity. New small left and trace right pleural effusions. Left greater than right basilar atelectasis. No consolidation or pneumothorax. No acute osseous abnormality. IMPRESSION: 1. New small left and trace right pleural effusions. Electronically Signed   By: Titus Dubin M.D.   On: 05/18/2018 14:58    Time Spent in minutes  25   Yilin Weedon M.D on 05/20/2018 at 6:03 PM  Between 7am to 7pm - Pager - [phone]  After 7pm go to [email or web address] - password [facility]  [facility] -  Office  [phone]

## 2018-05-20 NOTE — Plan of Care (Signed)
  Problem: Education: Goal: Knowledge of General Education information will improve Description: Including pain rating scale, medication(s)/side effects and non-pharmacologic comfort measures Outcome: Progressing   Problem: Clinical Measurements: Goal: Ability to maintain clinical measurements within normal limits will improve Outcome: Progressing   

## 2018-05-20 NOTE — Evaluation (Signed)
Speech Language Pathology Evaluation Patient Details Name: Nancy Bush MRN: [id number] DOB: Dec 10, 1925 Today's Date: 05/20/2018 Time: 1422-1500 SLP Time Calculation (min) (ACUTE ONLY): 38 min  Problem List:  Patient Active Problem List   Diagnosis Date Noted  . TGA (transient global amnesia) 05/19/2018  . Essential hypertension 12/11/2017  . Medication management 12/11/2017  . Pulmonary HTN ([place]) 12/30/2016  . Protein calorie malnutrition ([place]) 08/22/2012  . CAP (community acquired pneumonia) 08/21/2012  . Hypokalemia 08/21/2012  . SOB (shortness of breath) 08/20/2012  . FTT (failure to thrive) in adult 08/20/2012  . Leucocytosis 08/20/2012  . Atrial flutter ([place]) 06/24/2012  . CKD (chronic kidney disease), stage III ([place]) 06/24/2012  . Pleural effusion 06/24/2012  . [employer] 05/25/2012  . Bradycardia 05/21/2012  . S/P mitral valve replacement with bioprosthetic valve 05/18/2012  . S/P Maze operation for atrial fibrillation 05/18/2012  . Severe mitral regurgitation 05/17/2012  . A-fib ([place]) 05/17/2012  . MR (mitral regurgitation) 05/03/2012  . Fatigue 01/29/2012  . Acute on chronic diastolic heart failure ([place]) 06/10/2011  . CKD (chronic kidney disease) 06/03/2011  . Encounter for long-term (current) use of anticoagulants 05/26/2011  . Atrial fibrillation ([place]) 05/19/2011  . CHEST PAIN UNSPECIFIED 08/28/2010  . ABNORMAL ELECTROCARDIOGRAM 11/14/2009  . Mitral Regurgitation 11/12/2009  . PULMONARY HYPERTENSION 11/12/2009  . Mitral valve disorder 11/12/2009  . SINUSITIS, CHRONIC 06/12/2007  . ALLERGIC RHINITIS 06/12/2007  . BRONCHITIS 06/12/2007  . COPD 06/12/2007   Past Medical History:  Past Medical History:  Diagnosis Date  . Asthma   . Atrial fibrillation ([place])   . CHF (congestive heart failure) ([place])   . COPD (chronic obstructive pulmonary disease) ([place])   . H/O: hysterectomy   . Hypertension   . LBBB (left bundle branch block)    per discharge note 2003  . Mitral regurgitation    MILD  . MVP (mitral valve prolapse)   . Pacemaker   . Pulmonary hypertension ([place])    MILD TO MODERATE BY ECHO  . PVC (premature ventricular contraction)   . Rheumatoid arthritis(714.0)   . S/P Maze operation for atrial fibrillation 05/18/2012   Complete biatrial lesion set using cryothermy with oversewing of LA appendage  . S/P mitral valve replacement with bioprosthetic valve 05/18/2012   4mm Edwards Magna Mitral pericardial bioprosthesis    Past Surgical History:  Past Surgical History:  Procedure Laterality Date  . ABDOMINAL HYSTERECTOMY    . APPENDECTOMY    . CATARACT EXTRACTION    . CHOLECYSTECTOMY    . hysterectomy    . MAZE  05/18/2012   Procedure: MAZE;  Surgeon: Rexene Alberts, MD;  Location: [place];  Service: Open Heart Surgery;  Laterality: Right;  . MITRAL VALVE REPLACEMENT  05/18/2012   Procedure: MINIMALLY INVASIVE MITRAL VALVE (MV) REPLACEMENT;  Surgeon: Rexene Alberts, MD;  Location: [place];  Service: Open Heart Surgery;  Laterality: N/A;  . OVARIAN CYST REMOVAL    . PACEMAKER INSERTION    . PERMANENT PACEMAKER INSERTION N/A 05/21/2012   Procedure: PERMANENT PACEMAKER INSERTION;  Surgeon: Evans Lance, MD;  Location[facility] CATH LAB;  Service: Cardiovascular;  Laterality: N/A;  . TEE WITHOUT CARDIOVERSION  02/09/2012   Procedure: TRANSESOPHAGEAL ECHOCARDIOGRAM (TEE);  Surgeon: Fay Records, MD;  Location: [facility] ENDOSCOPY;  Service: Cardiovascular;  Laterality: N/A;   HPI:  84 yo female adm to [facility] after having an episode of amnesia-  She did not recall having spoken to her family earlier in the day and  calling them to tell them she was not thinking straight.  CT head negative.  Pt unable to have MRI, she is s/p EEG and now having vascular testing.  Speech/cog evaluation ordered.     Assessment / Plan / Recommendation Clinical Impression  MOCA 7.2 administered to pt with her scoring 29/30 points!  She only missed one  word on memory testing and was able to name from category cue.  Pt without cranial nerve deficits and has fluent speech/language skills.  Advised niece to occasionaly check to assure pt taking her medications as needed, etc.  No follow up needed from SLP for this impressive patient.      SLP Assessment  SLP Recommendation/Assessment: Patient does not need any further Speech [place] [facility] SLP Visit Diagnosis: Cognitive communication deficit (R41.841)    Follow Up Recommendations  None    Frequency and Duration     n/a      SLP Evaluation Cognition  Overall Cognitive Status: Within Functional Limits for tasks assessed Arousal/Alertness: Awake/alert Orientation Level: Oriented X4 Attention: Focused;Sustained Focused Attention: Appears intact Sustained Attention: Appears intact Memory: Appears intact(missed 1/5 words, recalled 1/5 with category cue) Awareness: Appears intact Problem Solving: Appears intact Safety/Judgment: Appears intact       Comprehension  Auditory Comprehension Overall Auditory Comprehension: Appears within functional limits for tasks assessed Yes/No Questions: Not tested Commands: Within Functional Limits Conversation: Complex Visual Recognition/Discrimination Discrimination: Within Function Limits Reading Comprehension Reading Status: Within funtional limits    Expression Expression Primary Mode of Expression: Verbal Verbal Expression Overall Verbal Expression: Appears within functional limits for tasks assessed Initiation: No impairment Naming: No impairment Pragmatics: No impairment Other Verbal Expression Comments: pt able to name 13 words in 60 seconds - 11 expected Written Expression Dominant Hand: Right Written Expression: Within Functional Limits   Oral / Motor  Oral Motor/Sensory Function Overall Oral Motor/Sensory Function: Within functional limits Motor Speech Overall Motor Speech: Appears within functional limits for  tasks assessed Respiration: Within functional limits Resonance: Within functional limits Articulation: Within functional limitis Intelligibility: Intelligible Motor Planning: Witnin functional limits Motor Speech Errors: Not applicable   GO                    Macario Golds 05/20/2018, 3:18 PM  Luanna Salk, [place] Pager [phone] Office [phone]

## 2018-05-20 NOTE — Evaluation (Signed)
Physical Therapy Evaluation Patient Details Name: Nancy Bush MRN: [id number] DOB: June 30, 1926 Today's Date: 05/20/2018   History of Present Illness  84 y.o. female with medical history significant of asthma, A.Fib on eliquis, CHF, MVR with bioprosthetic, RA and recent ED for SOB, found to have mild CHF exacerbation.  Pt admitted for transient global amnesia.   Clinical Impression  Pt admitted with above diagnosis. Pt currently with functional limitations due to the deficits listed below (see PT Problem List).  Pt mobility appears near baseline however she does reports dyspnea with exertion. Pt will benefit from skilled PT to increase their independence and safety with mobility to allow discharge to the venue listed below.  Due to admission diagnosis, would recommend initial supervision for safety upon d/c.      Follow Up Recommendations Home health PT;Supervision for mobility/OOB    Equipment Recommendations  None recommended by PT    Recommendations for Other Services       Precautions / Restrictions Precautions Precautions: Fall      Mobility  Bed Mobility               General bed mobility comments: pt up in recliner on arrival  Transfers Overall transfer level: Needs assistance Equipment used: None Transfers: Sit to/from Stand Sit to Stand: Min guard         General transfer comment: min/guard for safety  Ambulation/Gait Ambulation/Gait assistance: Min guard Gait Distance (Feet): 80 Feet Assistive device: None Gait Pattern/deviations: Step-through pattern;Decreased stride length;Narrow base of support     General Gait Details: short steps, SPO2 97-100% during ambulation, pt reports moderate dyspnea and required standing rest break after 40 feet  Stairs            Wheelchair Mobility    Modified Rankin (Stroke Patients Only)       Balance Overall balance assessment: Mild deficits observed, not formally tested(denies any recent  falls)                                           Pertinent Vitals/Pain Pain Assessment: No/denies pain    Home Living Family/patient expects to be discharged to:: Private residence Living Arrangements: Alone Available Help at Discharge: Family;Available PRN/intermittently Type of Home: House       Home Layout: One level Home Equipment: [place] - 2 wheels;Cane - single point      Prior Function Level of Independence: Independent         Comments: pt reports living alone and being independent (does admit to furniture walking when necessary)     Hand Dominance        Extremity/Trunk Assessment        Lower Extremity Assessment Lower Extremity Assessment: Generalized weakness       Communication   Communication: HOH  Cognition Arousal/Alertness: Awake/alert Behavior During Therapy: WFL for tasks assessed/performed Overall Cognitive Status: Within Functional Limits for tasks assessed                                 General Comments: orientated to person, place, year      General Comments      Exercises     Assessment/Plan    PT Assessment Patient needs continued PT services  PT Problem List Decreased strength;Decreased mobility;Decreased activity tolerance;Decreased balance;Decreased knowledge of use of DME  PT Treatment Interventions Gait training;Therapeutic exercise;Patient/family education;DME instruction;Stair training;Functional mobility training;Therapeutic activities;Balance training    PT Goals (Current goals can be found in the Care Plan section)  Acute Rehab PT Goals PT Goal Formulation: With patient Time For Goal Achievement: 05/27/18 Potential to Achieve Goals: Good    Frequency Min 3X/week   Barriers to discharge        Co-evaluation               AM-PAC PT "6 Clicks" Daily Activity  Outcome Measure Difficulty turning over in bed (including adjusting bedclothes, sheets and blankets)?:  A Little Difficulty moving from lying on back to sitting on the side of the bed? : A Little Difficulty sitting down on and standing up from a chair with arms (e.g., wheelchair, bedside commode, etc,.)?: A Little Help needed moving to and from a bed to chair (including a wheelchair)?: A Little Help needed walking in hospital room?: A Little Help needed climbing 3-5 steps with a railing? : A Lot 6 Click Score: 17    End of Session Equipment Utilized During Treatment: Gait belt Activity Tolerance: Patient tolerated treatment well Patient left: in chair;with call bell/phone within reach Nurse Communication: Mobility status PT Visit Diagnosis: Difficulty in walking, not elsewhere classified (R26.2)    Time: 8381-8403 PT Time Calculation (min) (ACUTE ONLY): 15 min   Charges:   PT Evaluation $PT Eval Low Complexity: [place], PT, DPT Acute Rehabilitation Services Office: [phone] Pager: [phone]  Trena Platt 05/20/2018, 1:44 PM

## 2018-05-20 NOTE — Telephone Encounter (Signed)
Returned call to niece (ok per DPR) who wanted to make Dr. Percival Spanish aware why she cancelled patients appointment this AM.   Patient is currently in hospital.   She was in the ER a couple days ago with SOB and was told this was CHF, was sent home with follow up but was sent back to the hospital yesterday due to confusion.   Currently admitted at [facility].   She wanted to make Dr. Percival Spanish aware of whats going on.    Advised would route to MD to make aware and we will schedule follow up with cardiology once discharged if not done while at the hospital.   She verbalized understanding.

## 2018-05-20 NOTE — Progress Notes (Signed)
  Echocardiogram 2D Echocardiogram has been performed.  Nancy Bush 05/20/2018, 1:45 PM

## 2018-05-20 NOTE — Progress Notes (Signed)
OT Cancellation Note  Patient Details Name: Nancy Bush MRN: [id number] DOB: [birth year]   Cancelled Treatment:    Reason Eval/Treat Not Completed: Patient at procedure or test/ unavailable  Will check back later in day or next day Kari Baars, [place] [phone] Office- [phone], Edwena Felty D 05/20/2018, 2:45 PM

## 2018-05-20 NOTE — Care Management Obs Status (Signed)
[place] NOTIFICATION   Patient Details  Name: Nancy Bush MRN: [id number] Date of Birth: [birth year]   Medicare Observation Status Notification Given:   yes    Purcell Mouton, RN 05/20/2018, 4:51 PM

## 2018-05-20 NOTE — Telephone Encounter (Signed)
New Message   Patients niece is calling to advise that the patient has been admitted to [facility].

## 2018-05-20 NOTE — Progress Notes (Signed)
EEG completed; results pending.    

## 2018-05-20 NOTE — Progress Notes (Signed)
*  PRELIMINARY RESULTS* Vascular Ultrasound Carotid Duplex (Doppler) has been completed. Findings suggest 1-39% internal carotid artery stenosis bilaterally. Vertebral arteries are patent with antegrade flow.  05/20/2018 3:29 PM Maudry Mayhew, MHA, RVT, RDCS, RDMS

## 2018-05-21 DIAGNOSIS — I5033 Acute on chronic diastolic (congestive) heart failure: Secondary | ICD-10-CM | POA: Diagnosis not present

## 2018-05-21 DIAGNOSIS — I2721 Secondary pulmonary arterial hypertension: Secondary | ICD-10-CM | POA: Diagnosis not present

## 2018-05-21 DIAGNOSIS — D61818 Other pancytopenia: Secondary | ICD-10-CM | POA: Diagnosis not present

## 2018-05-21 DIAGNOSIS — G454 Transient global amnesia: Secondary | ICD-10-CM | POA: Diagnosis not present

## 2018-05-21 LAB — SAVE SMEAR (SSMR)

## 2018-05-21 LAB — CBC
HCT: 32.3 % — ABNORMAL LOW (ref 36.0–46.0)
Hemoglobin: 9.9 g/dL — ABNORMAL LOW (ref 12.0–15.0)
MCH: 29.1 pg (ref 26.0–34.0)
MCHC: 30.7 g/dL (ref 30.0–36.0)
MCV: 95 fL (ref 80.0–100.0)
NRBC: 0 % (ref 0.0–0.2)
PLATELETS: 84 10*3/uL — AB (ref 150–400)
RBC: 3.4 MIL/uL — AB (ref 3.87–5.11)
RDW: 14.6 % (ref 11.5–15.5)
WBC: 3.2 10*3/uL — ABNORMAL LOW (ref 4.0–10.5)

## 2018-05-21 LAB — TSH: TSH: 4.873 u[IU]/mL — AB (ref 0.350–4.500)

## 2018-05-21 LAB — BASIC METABOLIC PANEL
Anion gap: 8 (ref 5–15)
BUN: 14 mg/dL (ref 8–23)
CO2: 26 mmol/L (ref 22–32)
Calcium: 9 mg/dL (ref 8.9–10.3)
Chloride: 105 mmol/L (ref 98–111)
Creatinine, Ser: 0.95 mg/dL (ref 0.44–1.00)
GFR calc Af Amer: 60 mL/min — ABNORMAL LOW (ref >60)
GFR, EST NON AFRICAN AMERICAN: 52 mL/min — AB (ref >60)
Glucose, Bld: 131 mg/dL — ABNORMAL HIGH (ref 70–99)
POTASSIUM: 3.8 mmol/L (ref 3.5–5.1)
SODIUM: 139 mmol/L (ref 135–145)

## 2018-05-21 MED ORDER — FUROSEMIDE 20 MG PO TABS: 40.0000 mg | ORAL_TABLET | Freq: Every day | ORAL | 0 refills | Status: DC

## 2018-05-21 MED ORDER — FUROSEMIDE 40 MG PO TABS
40.0000 mg | ORAL_TABLET | Freq: Every day | ORAL | Status: DC
  Administered 2018-05-21: 40 mg via ORAL
  Filled 2018-05-21: qty 1

## 2018-05-21 NOTE — Procedures (Signed)
History: 84 year old female with likely TGA  Sedation: None  Technique: This is a 21 channel routine scalp EEG performed at the bedside with bipolar and monopolar montages arranged in accordance to the international 10/20 system of electrode placement. One channel was dedicated to EKG recording.    Background: The background consists of intermixed alpha and beta activities. There is a well defined posterior dominant rhythm of 9 - 10 Hz that attenuates with eye opening.  Sleep is not recorded.  There were no epileptiform discharges.  There was significant muscle artifact throughout the study.  Photic stimulation: Physiologic driving is not performed  EEG Abnormalities: None  Clinical Interpretation: This normal EEG is recorded in the waking state. There was no seizure or seizure predisposition recorded on this study. Please note that a normal EEG does not preclude the possibility of epilepsy.   Roland Rack, MD [facility] [phone]  If 7pm- 7am, please page neurology on call as listed in [place].

## 2018-05-21 NOTE — Progress Notes (Signed)
EEG negative. I have personally reviewed the EEG and also discussed the findings with Dr. Leonel Ramsay, who performed the official read on the EEG (in Epic).   From a neurological standpoint, the patient can be discharged home if cleared by PT, with outpatient neurology follow up for TGA in 4-6 weeks.   Electronically signed: Dr. Kerney Elbe

## 2018-05-21 NOTE — Progress Notes (Signed)
Physical Therapy Treatment Patient Details Name: Nancy Bush MRN: [id number] DOB: Apr 26, 1926 Today's Date: 05/21/2018    History of Present Illness 84 y.o. female with medical history significant of asthma, A.Fib on eliquis, CHF, MVR with bioprosthetic, RA and recent ED for SOB, found to have mild CHF exacerbation.  Pt admitted for transient global amnesia.     PT Comments    Pt requesting to use bathroom and supervision level within room.  Pt agreeable to ambulate in hallway and provided HHA per pt request.  Pt tolerated mobility well however does report mild SOB remains with ambulating so educated to take short rest breaks as needed upon d/c.  Pt likely to d/c home today.   Follow Up Recommendations  Home health PT;Supervision for mobility/OOB     Equipment Recommendations  None recommended by PT    Recommendations for Other Services       Precautions / Restrictions Precautions Precautions: Fall    Mobility  Bed Mobility Overal bed mobility: Needs Assistance Bed Mobility: Sit to Supine;Supine to Sit     Supine to sit: Supervision Sit to supine: Supervision      Transfers Overall transfer level: Needs assistance Equipment used: None Transfers: Sit to/from Stand Sit to Stand: Supervision            Ambulation/Gait Ambulation/Gait assistance: Min guard Gait Distance (Feet): 120 Feet Assistive device: 1 person hand held assist Gait Pattern/deviations: Step-through pattern;Decreased stride length;Narrow base of support     General Gait Details: steady with short distances in room, pt requested HHA for hallway ambulation, no dizziness, pt reports mild SOB today, required 2 short breathing rest break    Stairs             Wheelchair Mobility    Modified Rankin (Stroke Patients Only)       Balance                                            Cognition Arousal/Alertness: Awake/alert Behavior During Therapy: WFL for  tasks assessed/performed Overall Cognitive Status: Within Functional Limits for tasks assessed                                        Exercises      General Comments        Pertinent Vitals/Pain Pain Assessment: No/denies pain    Home Living                      Prior Function            PT Goals (current goals can now be found in the care plan section) Progress towards PT goals: Progressing toward goals    Frequency    Min 3X/week      PT Plan Current plan remains appropriate    Co-evaluation              AM-PAC PT "6 Clicks" Daily Activity  Outcome Measure  Difficulty turning over in bed (including adjusting bedclothes, sheets and blankets)?: A Little Difficulty moving from lying on back to sitting on the side of the bed? : A Little Difficulty sitting down on and standing up from a chair with arms (e.g., wheelchair, bedside commode, etc,.)?: A Little Help needed moving to and  from a bed to chair (including a wheelchair)?: A Little Help needed walking in hospital room?: A Little Help needed climbing 3-5 steps with a railing? : A Little 6 Click Score: 18    End of Session Equipment Utilized During Treatment: Gait belt Activity Tolerance: Patient tolerated treatment well Patient left: with call bell/phone within reach;in bed;with bed alarm set Nurse Communication: Mobility status PT Visit Diagnosis: Difficulty in walking, not elsewhere classified (R26.2)     Time: 1017-1030 PT Time Calculation (min) (ACUTE ONLY): 13 min  Charges:  $Gait Training: 8-22 mins                    .Carmelia Bake, PT, DPT Acute Rehabilitation Services Office: [phone] Pager: [phone]   Trena Platt 05/21/2018, 11:47 AM

## 2018-05-21 NOTE — Progress Notes (Signed)
Patient and her niece, Hoyle Sauer, given discharge, follow up, and medication instructions, verbalized understanding, IV and telemetry monitor removed, prescription and personal belongings with patient, family to transport home

## 2018-05-21 NOTE — Discharge Summary (Signed)
Physician Discharge Summary  Nancy Bush OXB:[id number] DOB: April 14, 1926 DOA: 05/19/2018  PCP: Lawerance Cruel, MD  Admit date: 05/19/2018 Discharge date: 05/21/2018  Admitted From: Home Disposition: Home  Recommendations for Outpatient Follow-up:  1. Follow up with PCP in 1 week.  Please follow CBC for pancytopenia.  Please adjust Lasix dose as necessary. 2. Referral made to outpatient neurology for follow-up in 4-6 weeks.  Home Health: PT and RN Equipment/Devices: Has a cane  Discharge Condition: Fair CODE STATUS: Full code Diet recommendation: Heart healthy    Discharge Diagnoses:  Principal Problem:   TGA (transient global amnesia)  Active Problems:   Acute on chronic diastolic heart failure (HCC)   A-fib (HCC)   S/P mitral valve replacement with bioprosthetic valve   Pacemaker-[employer]   Pancytopenia (HCC)  Brief narrative/HPI  84 year old female with history of asthma, A. fib on Eliquis, diastolic CHF, MVR with bioprosthetic valve and rheumatoid arthritis was seen in the ED the previous day with shortness of breath and found to have mild CHF exacerbation.  Her Lasix dose was increased to 20 mg daily for next 5 days with outpatient follow-up (patient was taking 20 mg every other day).  As per niece who interacts with her daily reported that patient was in her normal state of health in the morning of admission when they visited her but during the afternoon when the patient called them she was having difficulty remembering the events of the morning and was not able to think clearly.  Per the niece she was also not answering questions appropriately and could not remember anything she had told her a few minutes ago.  She also did not remember coming to the hospital the previous day.  Per her niece patient has been fatigued over the past 2 weeks. Vitals on presentation was normal.  Labs showed WC of 2.9, hemoglobin of 11 and platelets of 91.  Patient placed on  observation for transient global amnesia.  Neurology consulted.  Hospital course  Principal Problem:   TGA (transient global amnesia) Etiology unclear at this time.  Head CT unremarkable for acute findings.  Cannot get MRI due to pacemaker.  Seen by neurology and EEG done which was negative for epileptiform activity. 2D echo shows normal LV function with EF of 60-65% and no wall motion normality.  Carotid Doppler without significant stenosis.  Seen by PT and recommends home health. Mental status has been at baseline throughout the hospital stay.  Outpatient follow-up with neurology recommended in about 4-6 weeks.  Referral placed.  Active Problems:   Acute on chronic diastolic heart failure (HCC) Severe pulmonary artery hypertension 2D echo with normal EF but increased RV pressure with moderately dilated RV and severe TR along with severe pulmonary hypertension (71 mmHg). Have increased dose of Lasix to 40 mg daily (was on 20 mg every other day at home).  Clinically appears euvolemic. Follow-up with her cardiologist in 4 weeks.   Proximal A. fib Rate controlled.  Continue Eliquis and metoprolol.  Follow platelets as outpatient.  Chronic asthma Continue Singulair and albuterol inhaler  Pancytopenia No clear etiology.  Cannot rule out acute viral illness.    Peripheral smear ordered.  Recheck labs in about 1 week as outpatient.   Goals of care Discussed at bedside.  Patient would like to have full scope of treatment for now but does not want to prolong resuscitation (stay on mechanical ventilator or undergo aggressive cardiac resuscitative procedure if no positive outcome) .  Family  Communication  : None at bedside  Disposition Plan  : Home   Consults  : Neurology  Procedures: Head CT, 2D echo, carotid Doppler and EEG  Discharge Instructions   Allergies as of 05/21/2018      Reactions   Promethazine Hcl Other (See Comments)   unknown   Sulfonamide Derivatives  Other (See Comments)   unknown   Tequin Other (See Comments)   hallucinations      Medication List    TAKE these medications   albuterol 108 (90 Base) MCG/ACT inhaler Commonly known as:  PROVENTIL HFA;VENTOLIN HFA Inhale 2 puffs into the lungs every 6 (six) hours as needed for wheezing or shortness of breath.   CALTRATE 600+D 600-400 MG-UNIT tablet Generic drug:  Calcium Carbonate-Vitamin D Take 1 tablet by mouth daily.   ELIQUIS 2.5 MG Tabs tablet Generic drug:  apixaban TAKE 1 TABLET BY MOUTH TWICE A DAY What changed:  how much to take   furosemide 20 MG tablet Commonly known as:  LASIX Take 2 tablets (40 mg total) by mouth daily. What changed:  See the new instructions.   ICAPS AREDS 2 PO Take 2 tablets by mouth daily.   metoprolol succinate 25 MG 24 hr tablet Commonly known as:  TOPROL-XL TAKE 1 TABLET BY MOUTH TWICE A DAY What changed:  when to take this   montelukast 10 MG tablet Commonly known as:  SINGULAIR Take 10 mg by mouth at bedtime.   nitroGLYCERIN 0.4 MG SL tablet Commonly known as:  NITROSTAT Place 0.4 mg under the tongue every 5 (five) minutes as needed for chest pain (x 3 tabs daily).   predniSONE 5 MG tablet Commonly known as:  DELTASONE Take 2.5 mg by mouth every other day.      Follow-up Information    [facility] Follow [facility].   Specialty:  [place] Why:  A nurse will call you in 24 to 48 hours after you discharge. Please allow nurse to visit you. Contact information: [address] [place] [address] [phone]        Lawerance Cruel, MD. Schedule an appointment as soon as possible for a visit in 1 week(s).   Specialty:  Family Medicine Contact information: [address] [place] [address] [phone]          Allergies  Allergen Reactions  . Promethazine Hcl Other (See Comments)    unknown  . Sulfonamide Derivatives Other (See Comments)    unknown  . Tequin Other (See  Comments)    hallucinations        Procedures/Studies: Dg Chest 2 View  Result Date: 05/19/2018 CLINICAL DATA:  Altered mental status EXAM: CHEST - 2 VIEW COMPARISON:  05/18/2018, 08/05/2013 FINDINGS: Left-sided pacing device and valve prosthesis. Tiny pleural effusions or pleural thickening. Cardiomegaly with aortic atherosclerosis. No pneumothorax. No focal consolidation. IMPRESSION: 1. Trace pleural effusions or pleural thickening 2. Cardiomegaly Electronically Signed   By: Donavan Foil M.D.   On: 05/19/2018 22:30   Ct Head Wo Contrast  Result Date: 05/19/2018 CLINICAL DATA:  84 year old female with altered mental status today. EXAM: CT HEAD WITHOUT CONTRAST TECHNIQUE: Contiguous axial images were obtained from the base of the skull through the vertex without intravenous contrast. COMPARISON:  Head CT 05/26/2012. FINDINGS: Brain: Stable cerebral volume since 2013, normal for age. No midline shift, ventriculomegaly, mass effect, evidence of mass lesion, intracranial hemorrhage or evidence of cortically based acute infarction. Gray-white matter differentiation is  normal for age throughout the brain. No cortical encephalomalacia identified. Vascular: Calcified atherosclerosis at the skull base. No suspicious intracranial vascular hyperdensity. Skull: No acute osseous abnormality identified. Sinuses/Orbits: Visualized paranasal sinuses and mastoids are stable and well pneumatized. Other: Stable orbit and scalp soft tissues. IMPRESSION: Normal for age noncontrast CT appearance of the brain. Electronically Signed   By: Genevie Ann M.D.   On: 05/19/2018 19:40   Dg Chest Portable 1 View  Result Date: 05/18/2018 CLINICAL DATA:  Worsening dyspnea on exertion over the last 2 weeks. EXAM: PORTABLE CHEST 1 VIEW COMPARISON:  Chest x-ray dated August 05, 2013. FINDINGS: Unchanged left chest wall AICD. Stable cardiomediastinal silhouette status post mitral valve replacement. Normal pulmonary vascularity. New  small left and trace right pleural effusions. Left greater than right basilar atelectasis. No consolidation or pneumothorax. No acute osseous abnormality. IMPRESSION: 1. New small left and trace right pleural effusions. Electronically Signed   By: Titus Dubin M.D.   On: 05/18/2018 14:58    2D echo Impressions:  - Normal LV size with mild LV hypertrophy. EF 60-65% . D-shaped   interventricular septum suggesting RV pressure/volume overload.   Moderately dilated RV with mildly decreased systolic function.   Bioprosthetic mitral valve appeared to function normally. Severe   tricuspid regurgitation. Severe pulmonary hypertension.    Subjective: Feels her shortness of breath to be better today.  No overnight events.  Discharge Exam: Vitals:   05/20/18 2007 05/21/18 0433  BP: 134/69 (!) 108/59  Pulse: (!) 58 60  Resp: 18 18  Temp: 97.8 F (36.6 C) 98 F (36.7 C)  SpO2: 96% 92%   Vitals:   05/20/18 0900 05/20/18 1200 05/20/18 2007 05/21/18 0433  BP: 121/65 119/64 134/69 (!) 108/59  Pulse: 63 60 (!) 58 60  Resp: 18 18 18 18   Temp: 97.6 F (36.4 C) 97.6 F (36.4 C) 97.8 F (36.6 C) 98 F (36.7 C)  TempSrc: Oral Oral Oral Oral  SpO2: 98% 97% 96% 92%  Weight:      Height:        General: Elderly female fatigued, not in distress HEENT: Moist mucosa, supple neck, no JVD Chest: Clear bilaterally CVS: S1 and S2 regular, systolic murmur 3/6 GI: Soft, nondistended, nontender Musculoskeletal: Warm, no edema    The results of significant diagnostics from this hospitalization (including imaging, microbiology, ancillary and laboratory) are listed below for reference.     Microbiology: No results found for this or any previous visit (from the past 240 hour(s)).   Labs: BNP (last 3 results) Recent Labs    05/18/18 1520  BNP 354.6*   Basic Metabolic Panel: Recent Labs  Lab 05/18/18 1520 05/19/18 1927 05/20/18 0417 05/21/18 0408  NA 136 139 141 139  K 3.6 4.0 4.1  3.8  CL 105 104 107 105  CO2 23 26 26 26   GLUCOSE 103* 109* 117* 131*  BUN 11 14 33* 14  CREATININE 1.04* 0.94 1.44* 0.95  CALCIUM 8.3* 8.7* 8.6* 9.0  MG 2.0  --   --   --    Liver Function Tests: Recent Labs  Lab 05/18/18 1520  AST 25  ALT 11  ALKPHOS 54  BILITOT 0.9  PROT 6.4*  ALBUMIN 3.7   No results for input(s): LIPASE, AMYLASE in the last 168 hours. No results for input(s): AMMONIA in the last 168 hours. CBC: Recent Labs  Lab 05/18/18 1520 05/19/18 1927 05/21/18 0408  WBC 3.2* 2.9* 3.2*  NEUTROABS 1.7 1.5*  --  HGB 10.3* 11.0* 9.9*  HCT 33.3* 35.8* 32.3*  MCV 94.1 94.0 95.0  PLT 83* 91* 84*   Cardiac Enzymes: No results for input(s): CKTOTAL, CKMB, CKMBINDEX, TROPONINI in the last 168 hours. BNP: Invalid input(s): POCBNP CBG: No results for input(s): GLUCAP in the last 168 hours. D-Dimer No results for input(s): DDIMER in the last 72 hours. Hgb A1c Recent Labs    05/20/18 0417  HGBA1C 5.8*   Lipid Profile Recent Labs    05/20/18 0417  CHOL 98  HDL 39*  LDLCALC 48  TRIG 54  CHOLHDL 2.5   Thyroid function studies Recent Labs    05/21/18 0800  TSH 4.873*   Anemia work up No results for input(s): VITAMINB12, FOLATE, FERRITIN, TIBC, IRON, RETICCTPCT in the last 72 hours. Urinalysis    Component Value Date/Time   COLORURINE YELLOW 05/19/2018 [place] 05/19/2018 1854   LABSPEC 1.008 05/19/2018 1854   PHURINE 6.0 05/19/2018 1854   GLUCOSEU NEGATIVE 05/19/2018 [place] 05/19/2018 [place] 05/19/2018 [place] 05/19/2018 1854   PROTEINUR NEGATIVE 05/19/2018 1854   UROBILINOGEN 0.2 08/21/2012 1645   NITRITE NEGATIVE 05/19/2018 1854   LEUKOCYTESUR NEGATIVE 05/19/2018 1854   Sepsis Labs Invalid input(s): PROCALCITONIN,  WBC,  LACTICIDVEN Microbiology No results found for this or any previous visit (from the past 240 hour(s)).   Time coordinating discharge: <30  minutes  SIGNED:   Louellen Molder, MD  [facility] 05/21/2018, 10:36 AM Pager   If 7PM-7AM, please contact night-coverage [email or web address] Password [email or web address]

## 2018-05-24 ENCOUNTER — Telehealth (HOSPITAL_COMMUNITY): Payer: Self-pay | Admitting: Cardiology

## 2018-05-24 NOTE — Telephone Encounter (Signed)
New message   Pt c/o medication issue:  1. Name of Medication:furosemide (LASIX) 20 MG tabletfurosemide (LASIX) 20 MG tablet 2. How are you currently taking this medication (dosage and times per day)? 40 mg 1 time daily   3. Are you having a reaction (difficulty breathing--STAT)? No   4. What is your medication issue? Patient states that she would like to know if this dosage of this medication is okay. She states that this dosage was increased while in the hospital and was advised to do this for a week. Patient wants to know how she should take this medication after she has taken for a week? Please advise.

## 2018-05-24 NOTE — Telephone Encounter (Signed)
Patient called stating that she was instructed at d/c that she should increase her lasix from 20 mg every other day to 40 mg daily; however, per Dr. Verita Lamb note it is stated two different ways for the patient to take her lasix. One part states increase to 20 mg daily and another states increase to 40 mg daily. Patient is calling to find out after one week of taking lasix 40 mg q daily what should the medication be decreased to. Also per the patient she was only taking 20 mg Monday, Wednesday, and Friday previously. Please discuss this with MD as this could potentially be a med error.

## 2018-05-25 ENCOUNTER — Ambulatory Visit: Payer: Medicare Other | Admitting: Cardiology

## 2018-05-25 ENCOUNTER — Encounter: Payer: Self-pay | Admitting: Internal Medicine

## 2018-05-25 DIAGNOSIS — D61818 Other pancytopenia: Secondary | ICD-10-CM | POA: Diagnosis not present

## 2018-05-25 DIAGNOSIS — I509 Heart failure, unspecified: Secondary | ICD-10-CM | POA: Diagnosis not present

## 2018-05-25 DIAGNOSIS — Z23 Encounter for immunization: Secondary | ICD-10-CM | POA: Diagnosis not present

## 2018-05-25 NOTE — Telephone Encounter (Signed)
Call and spoke with pt to see how she was doing, pt stated she went to Dr Harrington Challenger today and he advised that she take Furosemide 20 mg daily, advised pt to follow Dr Harrington Challenger instruction and to call if she is having any other issue, pt voice understanding and thanks.

## 2018-05-26 DIAGNOSIS — J209 Acute bronchitis, unspecified: Secondary | ICD-10-CM | POA: Diagnosis not present

## 2018-05-26 DIAGNOSIS — I447 Left bundle-branch block, unspecified: Secondary | ICD-10-CM | POA: Diagnosis not present

## 2018-05-26 DIAGNOSIS — G454 Transient global amnesia: Secondary | ICD-10-CM | POA: Diagnosis not present

## 2018-05-26 DIAGNOSIS — J40 Bronchitis, not specified as acute or chronic: Secondary | ICD-10-CM | POA: Diagnosis not present

## 2018-05-26 DIAGNOSIS — I272 Pulmonary hypertension, unspecified: Secondary | ICD-10-CM | POA: Diagnosis not present

## 2018-05-26 DIAGNOSIS — Z7901 Long term (current) use of anticoagulants: Secondary | ICD-10-CM | POA: Diagnosis not present

## 2018-05-26 DIAGNOSIS — Z90710 Acquired absence of both cervix and uterus: Secondary | ICD-10-CM | POA: Diagnosis not present

## 2018-05-26 DIAGNOSIS — I4891 Unspecified atrial fibrillation: Secondary | ICD-10-CM | POA: Diagnosis not present

## 2018-05-26 DIAGNOSIS — M069 Rheumatoid arthritis, unspecified: Secondary | ICD-10-CM | POA: Diagnosis not present

## 2018-05-26 DIAGNOSIS — I340 Nonrheumatic mitral (valve) insufficiency: Secondary | ICD-10-CM | POA: Diagnosis not present

## 2018-05-26 DIAGNOSIS — I5032 Chronic diastolic (congestive) heart failure: Secondary | ICD-10-CM | POA: Diagnosis not present

## 2018-05-26 DIAGNOSIS — N183 Chronic kidney disease, stage 3 (moderate): Secondary | ICD-10-CM | POA: Diagnosis not present

## 2018-05-26 DIAGNOSIS — I130 Hypertensive heart and chronic kidney disease with heart failure and stage 1 through stage 4 chronic kidney disease, or unspecified chronic kidney disease: Secondary | ICD-10-CM | POA: Diagnosis not present

## 2018-05-26 DIAGNOSIS — E46 Unspecified protein-calorie malnutrition: Secondary | ICD-10-CM | POA: Diagnosis not present

## 2018-05-26 DIAGNOSIS — Z950 Presence of cardiac pacemaker: Secondary | ICD-10-CM | POA: Diagnosis not present

## 2018-05-26 DIAGNOSIS — I4892 Unspecified atrial flutter: Secondary | ICD-10-CM | POA: Diagnosis not present

## 2018-05-26 DIAGNOSIS — Z9181 History of falling: Secondary | ICD-10-CM | POA: Diagnosis not present

## 2018-05-26 DIAGNOSIS — Z7951 Long term (current) use of inhaled steroids: Secondary | ICD-10-CM | POA: Diagnosis not present

## 2018-05-26 DIAGNOSIS — J449 Chronic obstructive pulmonary disease, unspecified: Secondary | ICD-10-CM | POA: Diagnosis not present

## 2018-05-26 DIAGNOSIS — Z7952 Long term (current) use of systemic steroids: Secondary | ICD-10-CM | POA: Diagnosis not present

## 2018-05-31 ENCOUNTER — Telehealth: Payer: Self-pay | Admitting: Cardiology

## 2018-05-31 NOTE — Telephone Encounter (Signed)
She can have her device interrogated sooner than her previous appt.

## 2018-05-31 NOTE — Telephone Encounter (Signed)
  Patient states that she feels like her heart is skipping beats or out of rhythym and would like advice on what to do

## 2018-05-31 NOTE — Telephone Encounter (Signed)
Returned call to patient of Dr. Percival Spanish who c/o skipping beats, feels like her heart is out of rhythm. She has h/o PAF - explained that she can go in and out of AF with this, so she may feel skipped beats sometimes and other times not. She manually checked her pulse and feels like it is skipping beats. She denies chest pain. She feels fatigued but this is not new. She has not been that short of breath. She is complaint with eliquis & metoprolol succinate. She does not have a home BP cuff.   Patient states she was previously on lasix 20mg  MWF and is now taking 20mg  QD. She has not noticed a change in her UOP. She does not weigh daily consistently. She has diastolic HF. Advised she should weigh daily.   She has appt with Dr. Lovena Le on 11/8 and will have her device interrogated at this visit. Should this be done sooner in case she is in AF more persistently than previous?   Will route to Dr. Percival Spanish & Dr. Lovena Le to review & advise

## 2018-06-01 NOTE — Telephone Encounter (Signed)
Call and spoke with Ms Banes and offer to moved her appt up to Thursday October 31st, pt unfortunately can't make this day because she won't have anyone to bring her, pt stated she will keep her appt date for November 8th, pt voice understanding and thanks.

## 2018-06-02 DIAGNOSIS — J449 Chronic obstructive pulmonary disease, unspecified: Secondary | ICD-10-CM | POA: Diagnosis not present

## 2018-06-02 DIAGNOSIS — I4891 Unspecified atrial fibrillation: Secondary | ICD-10-CM | POA: Diagnosis not present

## 2018-06-02 DIAGNOSIS — I5032 Chronic diastolic (congestive) heart failure: Secondary | ICD-10-CM | POA: Diagnosis not present

## 2018-06-02 DIAGNOSIS — I130 Hypertensive heart and chronic kidney disease with heart failure and stage 1 through stage 4 chronic kidney disease, or unspecified chronic kidney disease: Secondary | ICD-10-CM | POA: Diagnosis not present

## 2018-06-02 DIAGNOSIS — N183 Chronic kidney disease, stage 3 (moderate): Secondary | ICD-10-CM | POA: Diagnosis not present

## 2018-06-02 DIAGNOSIS — G454 Transient global amnesia: Secondary | ICD-10-CM | POA: Diagnosis not present

## 2018-06-04 DIAGNOSIS — N183 Chronic kidney disease, stage 3 (moderate): Secondary | ICD-10-CM | POA: Diagnosis not present

## 2018-06-04 DIAGNOSIS — J449 Chronic obstructive pulmonary disease, unspecified: Secondary | ICD-10-CM | POA: Diagnosis not present

## 2018-06-04 DIAGNOSIS — I4891 Unspecified atrial fibrillation: Secondary | ICD-10-CM | POA: Diagnosis not present

## 2018-06-04 DIAGNOSIS — G454 Transient global amnesia: Secondary | ICD-10-CM | POA: Diagnosis not present

## 2018-06-04 DIAGNOSIS — I130 Hypertensive heart and chronic kidney disease with heart failure and stage 1 through stage 4 chronic kidney disease, or unspecified chronic kidney disease: Secondary | ICD-10-CM | POA: Diagnosis not present

## 2018-06-04 DIAGNOSIS — I5032 Chronic diastolic (congestive) heart failure: Secondary | ICD-10-CM | POA: Diagnosis not present

## 2018-06-10 DIAGNOSIS — I5032 Chronic diastolic (congestive) heart failure: Secondary | ICD-10-CM | POA: Diagnosis not present

## 2018-06-10 DIAGNOSIS — J449 Chronic obstructive pulmonary disease, unspecified: Secondary | ICD-10-CM | POA: Diagnosis not present

## 2018-06-10 DIAGNOSIS — G454 Transient global amnesia: Secondary | ICD-10-CM | POA: Diagnosis not present

## 2018-06-10 DIAGNOSIS — N183 Chronic kidney disease, stage 3 (moderate): Secondary | ICD-10-CM | POA: Diagnosis not present

## 2018-06-10 DIAGNOSIS — I4891 Unspecified atrial fibrillation: Secondary | ICD-10-CM | POA: Diagnosis not present

## 2018-06-10 DIAGNOSIS — I130 Hypertensive heart and chronic kidney disease with heart failure and stage 1 through stage 4 chronic kidney disease, or unspecified chronic kidney disease: Secondary | ICD-10-CM | POA: Diagnosis not present

## 2018-06-11 ENCOUNTER — Ambulatory Visit (INDEPENDENT_AMBULATORY_CARE_PROVIDER_SITE_OTHER): Payer: Medicare Other | Admitting: Internal Medicine

## 2018-06-11 ENCOUNTER — Encounter: Payer: Self-pay | Admitting: Internal Medicine

## 2018-06-11 VITALS — BP 132/62 | HR 63 | Ht 59.0 in | Wt 103.0 lb

## 2018-06-11 DIAGNOSIS — I4891 Unspecified atrial fibrillation: Secondary | ICD-10-CM

## 2018-06-11 DIAGNOSIS — Z9889 Other specified postprocedural states: Secondary | ICD-10-CM | POA: Diagnosis not present

## 2018-06-11 DIAGNOSIS — R001 Bradycardia, unspecified: Secondary | ICD-10-CM

## 2018-06-11 DIAGNOSIS — Z950 Presence of cardiac pacemaker: Secondary | ICD-10-CM

## 2018-06-11 DIAGNOSIS — I10 Essential (primary) hypertension: Secondary | ICD-10-CM | POA: Diagnosis not present

## 2018-06-11 DIAGNOSIS — I340 Nonrheumatic mitral (valve) insufficiency: Secondary | ICD-10-CM

## 2018-06-11 DIAGNOSIS — Z8679 Personal history of other diseases of the circulatory system: Secondary | ICD-10-CM

## 2018-06-11 DIAGNOSIS — Z953 Presence of xenogenic heart valve: Secondary | ICD-10-CM | POA: Diagnosis not present

## 2018-06-11 DIAGNOSIS — I5032 Chronic diastolic (congestive) heart failure: Secondary | ICD-10-CM | POA: Diagnosis not present

## 2018-06-11 NOTE — Patient Instructions (Addendum)
Medication Instructions:  Your physician recommends that you continue on your current medications as directed. Please refer to the Current Medication list given to you today.  I will call you with further direction regarding your lasix after I get the results of your blood work.  Labwork: You will get lab work today:  BMP  Testing/Procedures: None ordered.  Follow-Up:  Your physician wants you to follow-up in: 6 months with the [facility] for a DEVICE CHECK.  Your physician wants you to follow-up in: one year with Dr. Lovena Le.   You will receive a reminder letter in the mail two months in advance. If you don't receive a letter, please call our office to schedule the follow-up appointment.  Any Other Special Instructions Will Be Listed Below (If Applicable).  If you need a refill on your cardiac medications before your next appointment, please call your pharmacy.

## 2018-06-11 NOTE — Progress Notes (Signed)
HPI Mrs. Fossum returns today for ongoing evaluation and management of her [employer] dual-chamber pacemaker. She is a very pleasant 84 year old woman with a history of complete heart block status post pacemaker insertion. She has persistent atrial flutter. Review of her ECGs demonstrates atypical flutter as well as atrial fibrillation the past. She is asymptomatic with this. She has become less active but she is not quite unsteady on her feet. She has not fallen. She has had no bleeding on oral anticoagulant therapy. She denies chest pain or shortness of breath. She does complain of some dizziness. No edema. She notes that she is up going to the bathroom several times at night, especially when she take her evening lasix. Her records suggest that this was stopped but she did not give me that impression. She denies palpitations.  Allergies  Allergen Reactions  . Promethazine Hcl Other (See Comments)    unknown  . Sulfonamide Derivatives Other (See Comments)    unknown  . Tequin Other (See Comments)    hallucinations      Current Outpatient Medications  Medication Sig Dispense Refill  . albuterol (PROVENTIL HFA;VENTOLIN HFA) 108 (90 Base) MCG/ACT inhaler Inhale 2 puffs into the lungs every 6 (six) hours as needed for wheezing or shortness of breath.    . Calcium Carbonate-Vitamin D (CALTRATE 600+D) 600-400 MG-UNIT per tablet Take 1 tablet by mouth daily.     Marland Kitchen ELIQUIS 2.5 MG TABS tablet TAKE 1 TABLET BY MOUTH TWICE A DAY 180 tablet 1  . ENSURE PLUS (ENSURE PLUS) LIQD Take 1 Can by mouth daily.    . feeding supplement (BOOST HIGH PROTEIN) LIQD Take 1 Container by mouth daily.    . furosemide (LASIX) 20 MG tablet Take 20 mg by mouth daily.    . metoprolol succinate (TOPROL-XL) 25 MG 24 hr tablet TAKE 1 TABLET BY MOUTH TWICE A DAY 180 tablet 0  . montelukast (SINGULAIR) 10 MG tablet Take 10 mg by mouth at bedtime.     . Multiple Vitamins-Minerals (PRESERVISION AREDS PO) Take 1  tablet by mouth daily with supper. Preservision Areds    . nitroGLYCERIN (NITROSTAT) 0.4 MG SL tablet Place 0.4 mg under the tongue every 5 (five) minutes as needed for chest pain (x 3 tabs daily).    . predniSONE (DELTASONE) 5 MG tablet Take 2.5 mg by mouth every other day.      No current facility-administered medications for this visit.      Past Medical History:  Diagnosis Date  . Asthma   . Atrial fibrillation ([place])   . CHF (congestive heart failure) ([place])   . COPD (chronic obstructive pulmonary disease) ([place])   . H/O: hysterectomy   . Hypertension   . LBBB (left bundle branch block)    per discharge note 2003  . Mitral regurgitation    MILD  . MVP (mitral valve prolapse)   . Pacemaker   . Pulmonary hypertension ([place])    MILD TO MODERATE BY ECHO  . PVC (premature ventricular contraction)   . Rheumatoid arthritis(714.0)   . S/P Maze operation for atrial fibrillation 05/18/2012   Complete biatrial lesion set using cryothermy with oversewing of LA appendage  . S/P mitral valve replacement with bioprosthetic valve 05/18/2012   76mm [facility] Mitral pericardial bioprosthesis     ROS:   All systems reviewed and negative except as noted in the HPI.   Past Surgical History:  Procedure Laterality Date  . ABDOMINAL HYSTERECTOMY    .  APPENDECTOMY    . CATARACT EXTRACTION    . CHOLECYSTECTOMY    . hysterectomy    . MAZE  05/18/2012   Procedure: MAZE;  Surgeon: Rexene Alberts, MD;  Location: [place];  Service: Open Heart Surgery;  Laterality: Right;  . MITRAL VALVE REPLACEMENT  05/18/2012   Procedure: MINIMALLY INVASIVE MITRAL VALVE (MV) REPLACEMENT;  Surgeon: Rexene Alberts, MD;  Location: [place];  Service: Open Heart Surgery;  Laterality: N/A;  . OVARIAN CYST REMOVAL    . PACEMAKER INSERTION    . PERMANENT PACEMAKER INSERTION N/A 05/21/2012   Procedure: PERMANENT PACEMAKER INSERTION;  Surgeon: Evans Lance, MD;  Location: [facility] CATH LAB;  Service: Cardiovascular;   Laterality: N/A;  . TEE WITHOUT CARDIOVERSION  02/09/2012   Procedure: TRANSESOPHAGEAL ECHOCARDIOGRAM (TEE);  Surgeon: Fay Records, MD;  Location: [facility] ENDOSCOPY;  Service: Cardiovascular;  Laterality: N/A;     No family history on file.   Social History   Socioeconomic History  . Marital status: Single    Spouse name: Not on file  . Number of children: Not on file  . Years of education: Not on file  . Highest education level: Not on file  Occupational History  . Not on file  Social Needs  . Financial resource strain: Not very hard  . Food insecurity:    Worry: Never true    Inability: Never true  . Transportation needs:    Medical: No    Non-medical: No  Tobacco Use  . Smoking status: Never Smoker  . Smokeless tobacco: Never Used  Substance and Sexual Activity  . Alcohol use: No  . Drug use: No  . Sexual activity: Not Currently    Birth control/protection: Post-menopausal  Lifestyle  . Physical activity:    Days per week: 0 days    Minutes per session: 0 min  . Stress: Only a little  Relationships  . Social connections:    Talks on phone: Not on file    Gets together: Not on file    Attends religious service: Not on file    Active member of club or organization: Not on file    Attends meetings of clubs or organizations: Not on file    Relationship status: Not on file  . Intimate partner violence:    Fear of current or ex partner: No    Emotionally abused: No    Physically abused: No    Forced sexual activity: No  Other Topics Concern  . Not on file  Social History Narrative  . Not on file     BP 132/62   Pulse 63   Ht 4\' 11"  (1.499 m)   Wt 103 lb (46.7 kg)   SpO2 92%   BMI 20.80 kg/m   Physical Exam:  Well appearing NAD HEENT: Unremarkable Neck:  No JVD, no thyromegally Lymphatics:  No adenopathy Back:  No CVA tenderness Lungs:  Clear with no wheezes HEART:  Regular rate rhythm, no murmurs, no rubs, no clicks Abd:  soft, positive bowel  sounds, no organomegally, no rebound, no guarding Ext:  2 plus pulses, no edema, no cyanosis, no clubbing Skin:  No rashes no nodules Neuro:  CN II through XII intact, motor grossly intact  EKG - none  DEVICE  Normal device function.  See PaceArt for details.   Assess/Plan: 1. CHB - she is asymptomatic, s/p PPM and is pacing 98% 2. PPM - her [employer] device is programmed DDIR.  3. PAF -  she is maintaining NSR today. As she is programmed DDIR, cannot no for sure the extent of her atrial fib.  4. Diastolic heart failure - her symptoms suggest more low output. I have asked her to take only 20 mg daily of lasix and we will check a bmp to look at her renal function and electrolytes.  Mikle Bosworth.D.

## 2018-06-12 LAB — BASIC METABOLIC PANEL
BUN / CREAT RATIO: 11 — AB (ref 12–28)
BUN: 11 mg/dL (ref 8–27)
CO2: 25 mmol/L (ref 20–29)
Calcium: 8.7 mg/dL (ref 8.7–10.3)
Chloride: 100 mmol/L (ref 96–106)
Creatinine, Ser: 1.02 mg/dL — ABNORMAL HIGH (ref 0.57–1.00)
GFR calc Af Amer: 56 mL/min/{1.73_m2} — ABNORMAL LOW (ref >59)
GFR calc non Af Amer: 49 mL/min/{1.73_m2} — ABNORMAL LOW (ref >59)
GLUCOSE: 97 mg/dL (ref 65–99)
Potassium: 3.8 mmol/L (ref 3.5–5.2)
SODIUM: 137 mmol/L (ref 134–144)

## 2018-06-13 ENCOUNTER — Encounter: Payer: Self-pay | Admitting: Internal Medicine

## 2018-06-15 ENCOUNTER — Telehealth: Payer: Self-pay | Admitting: Internal Medicine

## 2018-06-15 DIAGNOSIS — I5032 Chronic diastolic (congestive) heart failure: Secondary | ICD-10-CM | POA: Diagnosis not present

## 2018-06-15 DIAGNOSIS — J449 Chronic obstructive pulmonary disease, unspecified: Secondary | ICD-10-CM | POA: Diagnosis not present

## 2018-06-15 DIAGNOSIS — I130 Hypertensive heart and chronic kidney disease with heart failure and stage 1 through stage 4 chronic kidney disease, or unspecified chronic kidney disease: Secondary | ICD-10-CM | POA: Diagnosis not present

## 2018-06-15 DIAGNOSIS — N183 Chronic kidney disease, stage 3 (moderate): Secondary | ICD-10-CM | POA: Diagnosis not present

## 2018-06-15 DIAGNOSIS — G454 Transient global amnesia: Secondary | ICD-10-CM | POA: Diagnosis not present

## 2018-06-15 DIAGNOSIS — I4891 Unspecified atrial fibrillation: Secondary | ICD-10-CM | POA: Diagnosis not present

## 2018-06-15 NOTE — Telephone Encounter (Signed)
Returned call to Pt.  Per Pt she only takes one furosemide tablet daily, not always first thing in the morning.    Pt has not noticed a decrease in her nightly urination.  Pt denies edema. Pt states her breathing is ok but she gets quickly tired with any activity.  Pt asks if she can decrease her lasix.  Will discuss with Dr. Lovena Le

## 2018-06-15 NOTE — Telephone Encounter (Signed)
Please call patient with lab results

## 2018-06-16 LAB — CUP PACEART INCLINIC DEVICE CHECK
Brady Statistic RV Percent Paced: 98 %
Date Time Interrogation Session: 20191108050000
Implantable Lead Implant Date: 20131018
Implantable Lead Location: 753859
Implantable Lead Model: 4136
Implantable Lead Serial Number: 29233089
Lead Channel Impedance Value: 499 Ohm
Lead Channel Pacing Threshold Pulse Width: 0.4 ms
Lead Channel Sensing Intrinsic Amplitude: 1.3 mV
Lead Channel Setting Pacing Amplitude: 2 V
Lead Channel Setting Pacing Pulse Width: 0.4 ms
MDC IDC LEAD IMPLANT DT: 20131018
MDC IDC LEAD LOCATION: 753860
MDC IDC LEAD SERIAL: 29232066
MDC IDC MSMT LEADCHNL RA PACING THRESHOLD AMPLITUDE: 0.5 V
MDC IDC MSMT LEADCHNL RA PACING THRESHOLD PULSEWIDTH: 0.4 ms
MDC IDC MSMT LEADCHNL RV IMPEDANCE VALUE: 597 Ohm
MDC IDC MSMT LEADCHNL RV PACING THRESHOLD AMPLITUDE: 0.7 V
MDC IDC PG IMPLANT DT: 20131018
MDC IDC SET LEADCHNL RV PACING AMPLITUDE: 1.2 V
MDC IDC SET LEADCHNL RV SENSING SENSITIVITY: 2.5 mV
MDC IDC STAT BRADY RA PERCENT PACED: 42 %
Pulse Gen Serial Number: 111608

## 2018-06-17 DIAGNOSIS — N183 Chronic kidney disease, stage 3 (moderate): Secondary | ICD-10-CM | POA: Diagnosis not present

## 2018-06-17 DIAGNOSIS — J449 Chronic obstructive pulmonary disease, unspecified: Secondary | ICD-10-CM | POA: Diagnosis not present

## 2018-06-17 DIAGNOSIS — I130 Hypertensive heart and chronic kidney disease with heart failure and stage 1 through stage 4 chronic kidney disease, or unspecified chronic kidney disease: Secondary | ICD-10-CM | POA: Diagnosis not present

## 2018-06-17 DIAGNOSIS — G454 Transient global amnesia: Secondary | ICD-10-CM | POA: Diagnosis not present

## 2018-06-17 DIAGNOSIS — I5032 Chronic diastolic (congestive) heart failure: Secondary | ICD-10-CM | POA: Diagnosis not present

## 2018-06-17 DIAGNOSIS — I4891 Unspecified atrial fibrillation: Secondary | ICD-10-CM | POA: Diagnosis not present

## 2018-06-18 NOTE — Telephone Encounter (Signed)
Per Dr. Lovena Le, Pt is "not dry by labs.  Continue lasix 20 mg daily"  Notified Pt of Dr. Tanna Furry advisement.  Pt disappointed, would like to see if she can decrease her lasix.  Advised I would send question to Dr. Percival Spanish to see if he thinks she can decrease her lasix.

## 2018-06-19 NOTE — Telephone Encounter (Signed)
OK to stop Lipitor and take only if she has SOB or edema.

## 2018-06-21 NOTE — Telephone Encounter (Signed)
Spoke with patient about Dr Percival Spanish recommendation, pt want to take lasix every Monday, Wednesday and Friday because that's how she used to take it, spoke with Dr Percival Spanish about it and he stated that it fine for pt to take lasix every Monday, Wednesday and Friday pt voice understanding and thanks.

## 2018-06-22 ENCOUNTER — Ambulatory Visit: Payer: Self-pay | Admitting: Neurology

## 2018-06-30 ENCOUNTER — Other Ambulatory Visit: Payer: Self-pay | Admitting: Cardiology

## 2018-07-06 ENCOUNTER — Telehealth: Payer: Self-pay | Admitting: Cardiology

## 2018-07-06 ENCOUNTER — Ambulatory Visit: Payer: Self-pay | Admitting: Neurology

## 2018-07-06 NOTE — Telephone Encounter (Signed)
Pt c/o Shortness Of Breath: STAT if SOB developed within the last 24 hours or pt is noticeably SOB on the phone  1. Are you currently SOB (can you hear that pt is SOB on the phone)? NO  2. How long have you been experiencing SOB? About two to three weeks, got worse since Sunday.   3. Are you SOB when sitting or when up moving around? At first only when walking far, but now short distance.   4. Are you currently experiencing any other symptoms? NO

## 2018-07-06 NOTE — Progress Notes (Deleted)
Cardiology Office Note   Date:  07/06/2018   ID:  Nancy Bush, DOB 15-May-1926, MRN [id number]  PCP:  Lawerance Cruel, MD  Cardiologist:   EP; Dr.Taylor  No chief complaint on file.    History of Present Illness: Nancy Bush is a 84 y.o. female who presents for ongoing assessment and management of mitral valve replacement, s/p bioprosthetic mitral valve and MAZE procedure in 05/2012, symptomatic bradycardia with PPM placement, PAF. When last seen by Dr. Percival Spanish on 12/11/2017 at which time she was stable and continued on medical management.   She was last seen by Dr. Lovena Le on 06/11/2018 for evaluation of [place] dual chamber pacemaker. On interrogation she was found to be 98% pacing. Her fatigue suggested low output and therefore lasix was decreased to 20 mg daily. BMET was ordered. Na 137, K+ 3.8, Creatinine 1.02.    Past Medical History:  Diagnosis Date  . Asthma   . Atrial fibrillation ([place])   . CHF (congestive heart failure) ([place])   . COPD (chronic obstructive pulmonary disease) ([place])   . H/O: hysterectomy   . Hypertension   . LBBB (left bundle branch block)    per discharge note 2003  . Mitral regurgitation    MILD  . MVP (mitral valve prolapse)   . Pacemaker   . Pulmonary hypertension ([place])    MILD TO MODERATE BY ECHO  . PVC (premature ventricular contraction)   . Rheumatoid arthritis(714.0)   . S/P Maze operation for atrial fibrillation 05/18/2012   Complete biatrial lesion set using cryothermy with oversewing of LA appendage  . S/P mitral valve replacement with bioprosthetic valve 05/18/2012   60mm [facility] Mitral pericardial bioprosthesis     Past Surgical History:  Procedure Laterality Date  . ABDOMINAL HYSTERECTOMY    . APPENDECTOMY    . CATARACT EXTRACTION    . CHOLECYSTECTOMY    . hysterectomy    . MAZE  05/18/2012   Procedure: MAZE;  Surgeon: Rexene Alberts, MD;  Location: [place];  Service: Open Heart Surgery;  Laterality:  Right;  . MITRAL VALVE REPLACEMENT  05/18/2012   Procedure: MINIMALLY INVASIVE MITRAL VALVE (MV) REPLACEMENT;  Surgeon: Rexene Alberts, MD;  Location: [place];  Service: Open Heart Surgery;  Laterality: N/A;  . OVARIAN CYST REMOVAL    . PACEMAKER INSERTION    . PERMANENT PACEMAKER INSERTION N/A 05/21/2012   Procedure: PERMANENT PACEMAKER INSERTION;  Surgeon: Evans Lance, MD;  Location[facility] C[facility] LAB;  Service: Cardiovascular;  Laterality: N/A;  . TEE WITHOUT CARDIOVERSION  02/09/2012   Procedure: TRANSESOPHAGEAL ECHOCARDIOGRAM (TEE);  Surgeon: Fay Records, MD;  Location: [facility] ENDOSCOPY;  Service: Cardiovascular;  Laterality: N/A;     Current Outpatient Medications  Medication Sig Dispense Refill  . albuterol (PROVENTIL HFA;VENTOLIN HFA) 108 (90 Base) MCG/ACT inhaler Inhale 2 puffs into the lungs every 6 (six) hours as needed for wheezing or shortness of breath.    . Calcium Carbonate-Vitamin D (CALTRATE 600+D) 600-400 MG-UNIT per tablet Take 1 tablet by mouth daily.     Marland Kitchen ELIQUIS 2.5 MG TABS tablet TAKE 1 TABLET BY MOUTH TWICE A DAY 180 tablet 1  . ENSURE PLUS (ENSURE PLUS) LIQD Take 1 Can by mouth daily.    . feeding supplement (BOOST HIGH PROTEIN) LIQD Take 1 Container by mouth daily.    . furosemide (LASIX) 20 MG tablet Take 20 mg by mouth daily.    . metoprolol succinate (TOPROL-XL) 25 MG 24 hr  tablet TAKE 1 TABLET BY MOUTH TWICE A DAY 180 tablet 0  . montelukast (SINGULAIR) 10 MG tablet Take 10 mg by mouth at bedtime.     . Multiple Vitamins-Minerals (PRESERVISION AREDS PO) Take 1 tablet by mouth daily with supper. Preservision Areds    . nitroGLYCERIN (NITROSTAT) 0.4 MG SL tablet Place 0.4 mg under the tongue every 5 (five) minutes as needed for chest pain (x 3 tabs daily).    . predniSONE (DELTASONE) 5 MG tablet Take 2.5 mg by mouth every other day.      No current facility-administered medications for this visit.     Allergies:   Promethazine hcl; Sulfonamide derivatives; and Tequin     Social History:  The patient  reports that she has never smoked. She has never used smokeless tobacco. She reports that she does not drink alcohol or use drugs.   Family History:  The patient's family history is not on file.    ROS: All other systems are reviewed and negative. Unless otherwise mentioned in H&P    PHYSICAL EXAM: VS:  There were no vitals taken for this visit. , BMI There is no height or weight on file to calculate BMI. GEN: Well nourished, well developed, in no acute distress HEENT: normal Neck: no JVD, carotid bruits, or masses Cardiac: ***RRR; no murmurs, rubs, or gallops,no edema  Respiratory:  Clear to auscultation bilaterally, normal work of breathing GI: soft, nontender, nondistended, + BS MS: no deformity or atrophy Skin: warm and dry, no rash Neuro:  Strength and sensation are intact Psych: euthymic mood, full affect   EKG:  EKG {ACTION; IS/IS [id number]} ordered today. The ekg ordered today demonstrates ***   Recent Labs: 05/18/2018: ALT 11; B Natriuretic Peptide 508.0; Magnesium 2.0 05/21/2018: Hemoglobin 9.9; Platelets 84; TSH 4.873 06/11/2018: BUN 11; Creatinine, Ser 1.02; Potassium 3.8; Sodium 137    Lipid Panel    Component Value Date/Time   CHOL 98 06/11/18 0417   TRIG 54 Jun 11, 2018 0417   HDL 39 (L) Jun 11, 2018 0417   CHOLHDL 2.5 2018-06-11 0417   VLDL 11 2018/06/11 0417   LDLCALC 48 06/11/2018 0417      Wt Readings from Last 3 Encounters:  06/11/18 103 lb (46.7 kg)  06-11-2018 113 lb 5.1 oz (51.4 kg)  05/18/18 102 lb 4.7 oz (46.4 kg)      Other studies Reviewed: Echocardiogram 06-11-2018  Left ventricle: The cavity size was normal. Wall thickness was   increased in a pattern of mild LVH. Systolic function was normal.   The estimated ejection fraction was in the range of 60% to 65%.   Wall motion was normal; there were no regional wall motion   abnormalities. The study was not technically sufficient to allow   evaluation  of LV diastolic dysfunction due to atrial   fibrillation. - Ventricular septum: D-shaped interventricular septum suggestive   of RV pressure/volume overload. - Aortic valve: Trileaflet; moderately calcified leaflets. There   was no stenosis. - Mitral valve: Bioprosthetic mitral valve. There does not appear   to be significant stenosis or regurgitation. Pressure half-time:   70 ms. Mean gradient (D): 5 mm Hg. Valve area by pressure   half-time: 3.14 cm^2. - Left atrium: The atrium was severely dilated. - Right ventricle: The cavity size was moderately dilated. Pacer   wire or catheter noted in right ventricle. Systolic function was   mildly reduced. - Right atrium: The atrium was severely dilated. - Tricuspid valve: There was severe regurgitation. There  was   systolic flow reversal in the hepatic vein doppler pattern. Peak   RV-RA gradient (S): 56 mm Hg. - Pulmonary arteries: PA peak pressure: 71 mm Hg (S). - Systemic veins: IVC measured 2.4 cm with < 50% respirophasic   variation, suggesting RA pressure 15 mmHg.  Impressions:  - Normal LV size with mild LV hypertrophy. EF 60-65% . D-shaped   interventricular septum suggesting RV pressure/volume overload.   Moderately dilated RV with mildly decreased systolic function.   Bioprosthetic mitral valve appeared to function normally. Severe   tricuspid regurgitation. Severe pulmonary hypertension.  ASSESSMENT AND PLAN:  1.  ***   Current medicines are reviewed at length with the patient today.    Labs/ tests ordered today include: *** Phill Myron. [place] Pugh, ANP, AACC   07/06/2018 1:20 PM    [facility] [address] Office [phone] Fax [phone]

## 2018-07-06 NOTE — Telephone Encounter (Signed)
Returned call to patient of Dr. Percival Spanish who reports SOB since she was in the hospital but it appears to be getting worse since Sunday. She gets short of breath walking short distances. She does not weigh herself. She denies swelling. She takes lasix every day. She has pretty good UOP. She does not recall having these symptoms when she saw Dr. Lovena Le 11/10.   Offered her an appointment this week - scheduled for 12/4 with Arnold Long DNP @ 2pm

## 2018-07-07 ENCOUNTER — Encounter (HOSPITAL_COMMUNITY): Payer: Self-pay | Admitting: Emergency Medicine

## 2018-07-07 ENCOUNTER — Emergency Department (HOSPITAL_COMMUNITY): Payer: Medicare Other

## 2018-07-07 ENCOUNTER — Ambulatory Visit: Payer: Medicare Other | Admitting: Adult Health

## 2018-07-07 ENCOUNTER — Other Ambulatory Visit: Payer: Self-pay

## 2018-07-07 ENCOUNTER — Emergency Department (HOSPITAL_COMMUNITY)
Admission: EM | Admit: 2018-07-07 | Discharge: 2018-07-07 | Disposition: A | Discharge status: Home / Self Care | Payer: Medicare Other | Attending: Emergency Medicine | Admitting: Emergency Medicine

## 2018-07-07 DIAGNOSIS — Z79899 Other long term (current) drug therapy: Secondary | ICD-10-CM | POA: Diagnosis not present

## 2018-07-07 DIAGNOSIS — J45909 Unspecified asthma, uncomplicated: Secondary | ICD-10-CM | POA: Insufficient documentation

## 2018-07-07 DIAGNOSIS — N183 Chronic kidney disease, stage 3 (moderate): Secondary | ICD-10-CM | POA: Diagnosis not present

## 2018-07-07 DIAGNOSIS — J449 Chronic obstructive pulmonary disease, unspecified: Secondary | ICD-10-CM | POA: Diagnosis not present

## 2018-07-07 DIAGNOSIS — Z950 Presence of cardiac pacemaker: Secondary | ICD-10-CM | POA: Insufficient documentation

## 2018-07-07 DIAGNOSIS — I129 Hypertensive chronic kidney disease with stage 1 through stage 4 chronic kidney disease, or unspecified chronic kidney disease: Secondary | ICD-10-CM | POA: Diagnosis not present

## 2018-07-07 DIAGNOSIS — R5383 Other fatigue: Secondary | ICD-10-CM | POA: Diagnosis not present

## 2018-07-07 DIAGNOSIS — I700 Atherosclerosis of aorta: Secondary | ICD-10-CM | POA: Insufficient documentation

## 2018-07-07 DIAGNOSIS — R0602 Shortness of breath: Secondary | ICD-10-CM | POA: Diagnosis not present

## 2018-07-07 DIAGNOSIS — Z7901 Long term (current) use of anticoagulants: Secondary | ICD-10-CM | POA: Insufficient documentation

## 2018-07-07 DIAGNOSIS — R0600 Dyspnea, unspecified: Secondary | ICD-10-CM | POA: Insufficient documentation

## 2018-07-07 DIAGNOSIS — I517 Cardiomegaly: Secondary | ICD-10-CM | POA: Diagnosis not present

## 2018-07-07 DIAGNOSIS — J90 Pleural effusion, not elsewhere classified: Secondary | ICD-10-CM | POA: Diagnosis not present

## 2018-07-07 LAB — COMPREHENSIVE METABOLIC PANEL
ALK PHOS: 59 U/L (ref 38–126)
ALT: 11 U/L (ref 0–44)
ANION GAP: 8 (ref 5–15)
AST: 31 U/L (ref 15–41)
Albumin: 3.8 g/dL (ref 3.5–5.0)
BILIRUBIN TOTAL: 0.8 mg/dL (ref 0.3–1.2)
BUN: 10 mg/dL (ref 8–23)
CALCIUM: 8.3 mg/dL — AB (ref 8.9–10.3)
CO2: 26 mmol/L (ref 22–32)
CREATININE: 0.9 mg/dL (ref 0.44–1.00)
Chloride: 103 mmol/L (ref 98–111)
GFR, EST NON AFRICAN AMERICAN: 56 mL/min — AB (ref >60)
Glucose, Bld: 101 mg/dL — ABNORMAL HIGH (ref 70–99)
Potassium: 3.7 mmol/L (ref 3.5–5.1)
Sodium: 137 mmol/L (ref 135–145)
TOTAL PROTEIN: 6.9 g/dL (ref 6.5–8.1)

## 2018-07-07 LAB — CBC WITH DIFFERENTIAL/PLATELET
Abs Immature Granulocytes: 0 10*3/uL (ref 0.00–0.07)
Basophils Absolute: 0 10*3/uL (ref 0.0–0.1)
Basophils Relative: 1 %
EOS ABS: 0.1 10*3/uL (ref 0.0–0.5)
Eosinophils Relative: 4 %
HCT: 33.7 % — ABNORMAL LOW (ref 36.0–46.0)
HEMOGLOBIN: 10.3 g/dL — AB (ref 12.0–15.0)
Immature Granulocytes: 0 %
LYMPHS ABS: 1 10*3/uL (ref 0.7–4.0)
Lymphocytes Relative: 38 %
MCH: 28.9 pg (ref 26.0–34.0)
MCHC: 30.6 g/dL (ref 30.0–36.0)
MCV: 94.4 fL (ref 80.0–100.0)
MONOS PCT: 19 %
Monocytes Absolute: 0.5 10*3/uL (ref 0.1–1.0)
NEUTROS PCT: 38 %
NRBC: 0 % (ref 0.0–0.2)
Neutro Abs: 1 10*3/uL — ABNORMAL LOW (ref 1.7–7.7)
Platelets: 91 10*3/uL — ABNORMAL LOW (ref 150–400)
RBC: 3.57 MIL/uL — ABNORMAL LOW (ref 3.87–5.11)
RDW: 13.8 % (ref 11.5–15.5)
WBC: 2.5 10*3/uL — ABNORMAL LOW (ref 4.0–10.5)

## 2018-07-07 LAB — BRAIN NATRIURETIC PEPTIDE: B Natriuretic Peptide: 492.8 pg/mL — ABNORMAL HIGH (ref 0.0–100.0)

## 2018-07-07 LAB — TROPONIN I: Troponin I: <0.03 ng/mL (ref <0.03)

## 2018-07-07 NOTE — ED Provider Notes (Signed)
Medical screening examination/treatment/procedure(s) were conducted as a shared visit with non-physician practitioner(s) and myself.  I personally evaluated the patient during the encounter.  EKG Interpretation  Date/Time:  Wednesday July 07 2018 14:42:25 EST Ventricular Rate:  60 PR Interval:  182 QRS Duration: 164 QT Interval:  538 QTC Calculation: 538 R Axis:   -163 Text Interpretation:  AV dual-paced rhythm Abnormal ECG no change from previous Confirmed by Charlesetta Shanks [phone]) on 07/07/2018 4:45:01 PM Patient reports that she has had shortness of breath that is been going on for a number of weeks.  She reports that it has started to interfere with her activities.  At rest she is not short of breath with exertion she is becoming easily fatigued and short of breath.  Patient was supposed to see Dr. Percival Spanish today but did not go to the appointment due to feeling short of breath.  She denies associated chest pain or syncope.  She has not had cough or fever.  She reports she had increased her Lasix from Monday Wednesday Friday to daily over the past 4 to 5 days.  She has not noticed significant change in the quality of her shortness of breath.  Patient is alert and well in appearance.  No respiratory distress.  Heart is regular.  2\6 systolic ejection murmur.  Lungs are clear bilaterally.  Abdomen soft and non-distended.  Lower extremities without peripheral edema.  Calves soft and nontender.  Patient does not clinically show signs of acute congestive heart failure.  Chest x-ray does not show acute changes.  Patient has history of CHF and with increasing exertional shortness of breath, may need repeat echo.  However at this time she is stable and does not appear to have impending failure.  I agree with plan and management.   Charlesetta Shanks, MD 07/07/18 [phone]

## 2018-07-07 NOTE — Discharge Instructions (Addendum)
our work-up has been reassuring in the emergency department today.  Unknown cause your symptoms may be secondary to backup of fluid.  You may take your 20 mg Lasix twice a day for the next 2 to 3 days and then decrease back down to once a day.  This medication will make you urinate more and can make you dehydrated so please if you develop any dizziness or lightheadedness return the ED.  Please follow-up primary care doctor for further cardiology referral.

## 2018-07-07 NOTE — ED Notes (Signed)
Patient and visitor are requesting to leave even though not all the blood has resulted.

## 2018-07-07 NOTE — ED Notes (Signed)
Pt 02 whilte ambulating 98% to 100%

## 2018-07-07 NOTE — ED Notes (Signed)
Bed: [facility] Expected date:  Expected time:  Means of arrival:  Comments: 

## 2018-07-07 NOTE — ED Triage Notes (Signed)
Patient complains of shortness of breath since Sunday and has increased since. She has a history of CHF and recently has had her lasix dosage decreased.

## 2018-07-11 NOTE — Progress Notes (Signed)
Cardiology Office Note   Date:  07/12/2018   ID:  ANISTON CHRISTMAN, DOB [birth year], MRN [id number]  PCP:  Lawerance Cruel, MD  Cardiologist: [facility] Chief Complaint  Patient presents with  . Hospitalization Follow-up    ER follow up.  . Shortness of Breath     History of Present Illness: Nancy Bush is a 84 y.o. female who presents for ongoing assessment and management of mitral valve disease status post mitral valve replacement, bioprosthetic and Maze procedure October 2013.  Patient also has a history of symptomatic bradycardia status post pacemaker implantation, paroxysmal atrial fib/flutter.  She was last seen by cardiology on 12/11/2017 and was without cardiac complaints.  The patient had slow ambulation using cane for support, she lives by herself and was able to perform her own ADLs.  She was seen in the emergency room on 07/07/2018 for complaints of dyspnea which had become progressive over several weeks interfering with her activities.  She was also complaining of easily being fatigued, but denied any chest pain or syncope.  She had increased her Lasix from Mondays Wednesdays and Friday dosing to daily over the last 4 days prior to being seen in the ER.  She did not notice an improvement in her breathing status.  She was not found to have any signs of CHF, chest x-ray did not show any acute changes.  She was to follow-up with cardiology post ER.  EKG revealed paced rhythm.  She comes today without further complaints. She has not further dyspnea, or chest pressure. She has some mild dizziness when she changes positions, but does not ambulate very much due to frailty. She doesn't have much of an appetite, but is drinking a Boost combined with Ensure once a day.   Past Medical History:  Diagnosis Date  . Asthma   . Atrial fibrillation ([place])   . CHF (congestive heart failure) ([place])   . COPD (chronic obstructive pulmonary disease) ([place])   . H/O: hysterectomy   .  Hypertension   . LBBB (left bundle branch block)    per discharge note 2003  . Mitral regurgitation    MILD  . MVP (mitral valve prolapse)   . Pacemaker   . Pulmonary hypertension ([place])    MILD TO MODERATE BY ECHO  . PVC (premature ventricular contraction)   . Rheumatoid arthritis(714.0)   . S/P Maze operation for atrial fibrillation 05/18/2012   Complete biatrial lesion set using cryothermy with oversewing of LA appendage  . S/P mitral valve replacement with bioprosthetic valve 05/18/2012   10mm Edwards Magna Mitral pericardial bioprosthesis     Past Surgical History:  Procedure Laterality Date  . ABDOMINAL HYSTERECTOMY    . APPENDECTOMY    . CATARACT EXTRACTION    . CHOLECYSTECTOMY    . hysterectomy    . MAZE  05/18/2012   Procedure: MAZE;  Surgeon: Rexene Alberts, MD;  Location: [facility];  Service: Open Heart Surgery;  Laterality: Right;  . MITRAL VALVE REPLACEMENT  05/18/2012   Procedure: MINIMALLY INVASIVE MITRAL VALVE (MV) REPLACEMENT;  Surgeon: Rexene Alberts, MD;  Location: [facility];  Service: Open Heart Surgery;  Laterality: N/A;  . OVARIAN CYST REMOVAL    . PACEMAKER INSERTION    . PERMANENT PACEMAKER INSERTION N/A 05/21/2012   Procedure: PERMANENT PACEMAKER INSERTION;  Surgeon: Evans Lance, MD;  Location: [facility] CATH LAB;  Service: Cardiovascular;  Laterality: N/A;  . TEE WITHOUT CARDIOVERSION  02/09/2012   Procedure: TRANSESOPHAGEAL ECHOCARDIOGRAM (TEE);  Surgeon: Fay Records, MD;  Location: [facility] ENDOSCOPY;  Service: Cardiovascular;  Laterality: N/A;     Current Outpatient Medications  Medication Sig Dispense Refill  . Calcium Carbonate-Vitamin D (CALTRATE 600+D) 600-400 MG-UNIT per tablet Take 1 tablet by mouth daily.     Marland Kitchen ELIQUIS 2.5 MG TABS tablet TAKE 1 TABLET BY MOUTH TWICE A DAY 180 tablet 1  . ENSURE PLUS (ENSURE PLUS) LIQD Take 1 Can by mouth daily.    . feeding supplement (BOOST HIGH PROTEIN) LIQD Take 1 Container by mouth daily.    . furosemide (LASIX) 20 MG  tablet Take 20 mg by mouth 3 (three) times a week. TAKE MON, WED AND Friday, OK TO TAKE PRN SWELLING    . metoprolol succinate (TOPROL-XL) 25 MG 24 hr tablet TAKE 1 TABLET BY MOUTH TWICE A DAY 180 tablet 0  . montelukast (SINGULAIR) 10 MG tablet Take 10 mg by mouth at bedtime.     . Multiple Vitamins-Minerals (PRESERVISION AREDS PO) Take 1 tablet by mouth 2 (two) times daily. Preservision Areds     . nitroGLYCERIN (NITROSTAT) 0.4 MG SL tablet Place 0.4 mg under the tongue every 5 (five) minutes as needed for chest pain (x 3 tabs daily).    . predniSONE (DELTASONE) 5 MG tablet Take 2.5 mg by mouth every other day.      No current facility-administered medications for this visit.     Allergies:   Promethazine hcl; Septra [sulfamethoxazole-trimethoprim]; Sulfonamide derivatives; and Tequin    Social History:  The patient  reports that she has never smoked. She has never used smokeless tobacco. She reports that she does not drink alcohol or use drugs.   Family History:  The patient's family history is not on file.    ROS: All other systems are reviewed and negative. Unless otherwise mentioned in H&P    PHYSICAL EXAM: VS:  BP 124/62   Pulse 66   Ht 4\' 11"  (1.499 m)   Wt 104 lb 9.6 oz (47.4 kg)   BMI 21.13 kg/m  , BMI Body mass index is 21.13 kg/m. GEN: Well nourished, well developed, in no acute distress HEENT: normal Neck: no JVD, carotid bruits, or masses Cardiac: RRR; soft systolic murmurs, rubs, or gallops,no edema  Respiratory:  Clear to auscultation bilaterally, normal work of breathing GI: soft, nontender, nondistended, + BS MS: no deformity or atrophy Skin: warm and dry, no rash Neuro:  Strength and sensation are intact Psych: euthymic mood, full affect   EKG: Not completed this office visit  Recent Labs: 05/18/2018: Magnesium 2.0 05/21/2018: TSH 4.873 07/07/2018: ALT 11; B Natriuretic Peptide 492.8; BUN 10; Creatinine, Ser 0.90; Hemoglobin 10.3; Platelets 91;  Potassium 3.7; Sodium 137    Lipid Panel    Component Value Date/Time   CHOL 98 06/03/18 0417   TRIG 54 06-03-18 0417   HDL 39 (L) 06/03/18 0417   CHOLHDL 2.5 06-03-2018 0417   VLDL 11 03-Jun-2018 0417   LDLCALC 48 2018-06-03 0417      Wt Readings from Last 3 Encounters:  07/12/18 104 lb 9.6 oz (47.4 kg)  06/11/18 103 lb (46.7 kg)  2018-06-03 113 lb 5.1 oz (51.4 kg)      Other studies Reviewed: Echocardiogram 06/03/2018 Left ventricle: The cavity size was normal. Wall thickness was   increased in a pattern of mild LVH. Systolic function was normal.   The estimated ejection fraction was in the range of 60% to 65%.   Wall motion was normal; there  were no regional wall motion   abnormalities. The study was not technically sufficient to allow   evaluation of LV diastolic dysfunction due to atrial   fibrillation. - Ventricular septum: D-shaped interventricular septum suggestive   of RV pressure/volume overload. - Aortic valve: Trileaflet; moderately calcified leaflets. There   was no stenosis. - Mitral valve: Bioprosthetic mitral valve. There does not appear   to be significant stenosis or regurgitation. Pressure half-time:   70 ms. Mean gradient (D): 5 mm Hg. Valve area by pressure   half-time: 3.14 cm^2. - Left atrium: The atrium was severely dilated. - Right ventricle: The cavity size was moderately dilated. Pacer   wire or catheter noted in right ventricle. Systolic function was   mildly reduced. - Right atrium: The atrium was severely dilated. - Tricuspid valve: There was severe regurgitation. There was   systolic flow reversal in the hepatic vein doppler pattern. Peak   RV-RA gradient (S): 56 mm Hg. - Pulmonary arteries: PA peak pressure: 71 mm Hg (S). - Systemic veins: IVC measured 2.4 cm with < 50% respirophasic   variation, suggesting RA pressure 15 mmHg.  Impressions:  - Normal LV size with mild LV hypertrophy. EF 60-65% . D-shaped   interventricular  septum suggesting RV pressure/volume overload.   Moderately dilated RV with mildly decreased systolic function.   Bioprosthetic mitral valve appeared to function normally. Severe   tricuspid regurgitation. Severe pulmonary hypertension.  ASSESSMENT AND PLAN:  1.  Dyspnea: No further episodes since ER visit an increase in her lasix dose to 20 mg daily. She is having some mild dizziness with position change. I will change her lasix back to 20 mg MWF as she was taking before, and to take additional lasix should she have episodes of dyspnea or wheezing. She is to call us to inform us when she does so.   2 Anemia: Hgb 10.3 in the ER. She is to follow up with PCP for ongoing management.   3. PAF: She remains on Eliquis. No bleeding or excessive bruising. I have voiced my concerns about her dizziness and potential to fall on anticoagulation. She is to call us if decrease in lasix helps. Do not want her to injure herself, especially on anticoagulation. Continue BB for HR control.   4. Nutrition deficit: She is drinking supplements. Should be followed by PCP for this.   Current medicines are reviewed at length with the patient today.    Labs/ tests ordered today include: None  Phill Myron. [place] Pugh, ANP, AACC   07/12/2018 2:56 PM    [address] Office [phone] Fax [phone]

## 2018-07-12 ENCOUNTER — Encounter: Payer: Self-pay | Admitting: Adult Health

## 2018-07-12 ENCOUNTER — Ambulatory Visit (INDEPENDENT_AMBULATORY_CARE_PROVIDER_SITE_OTHER): Payer: Medicare Other | Admitting: Adult Health

## 2018-07-12 VITALS — BP 124/62 | HR 66 | Ht 59.0 in | Wt 104.6 lb

## 2018-07-12 DIAGNOSIS — R0600 Dyspnea, unspecified: Secondary | ICD-10-CM | POA: Diagnosis not present

## 2018-07-12 DIAGNOSIS — Z9889 Other specified postprocedural states: Secondary | ICD-10-CM

## 2018-07-12 DIAGNOSIS — Z953 Presence of xenogenic heart valve: Secondary | ICD-10-CM

## 2018-07-12 DIAGNOSIS — Z8679 Personal history of other diseases of the circulatory system: Secondary | ICD-10-CM | POA: Diagnosis not present

## 2018-07-12 DIAGNOSIS — I480 Paroxysmal atrial fibrillation: Secondary | ICD-10-CM

## 2018-07-12 NOTE — Patient Instructions (Signed)
Medication Instructions:  TAKE LASIX MON, WED AND FRI, MAY TAKE EXTRA AS-NEEDED FOR SWELLING  If you need a refill on your cardiac medications before your next appointment, please call your pharmacy.  Labwork: If you have labs (blood work) drawn today and your tests are completely normal, you will receive your results only by: Marland Kitchen MyChart Message (if you have MyChart) OR . A paper copy in the mail If you have any lab test that is abnormal or we need to change your treatment, we will call you to review the results.  Follow-Up: You will need a follow up appointment in April.  Please call our office 2 months in advance(FEB 2020) to schedule the (April 2020) appointment.  You may see  DR Hans Eden, DNP, [place] or one of the following Advanced Practice Providers on your designated Care Team:    Rosaria Ferries, [place], DNP, ANP  At [facility], you and your health needs are our priority.  As part of our continuing mission to provide you with exceptional heart care, we have created designated Provider Care Teams.  These Care Teams include your primary Cardiologist (physician) and Advanced Practice Providers (APPs -  Physician Assistants and Nurse Practitioners) who all work together to provide you with the care you need, when you need it.

## 2018-07-18 NOTE — ED Provider Notes (Signed)
[facility] DEPT Provider Note   CSN: [id number] Arrival date & time: 07/07/18  1315     History   Chief Complaint Chief Complaint  Patient presents with  . Shortness of Breath    HPI Nancy Bush is a 84 y.o. female.  HPI 84 year old female past medical history significant for A. fib, CHF, COPD, hypertension, pulmonary hypertension presents to the emergency department today for evaluation of shortness of breath.  Patient states this is been ongoing for several weeks to several months.  She states that is starting to interfere with her activities.  She states that shortness of breath is only with exertion along with easily fatigued.  Patient states that she did have an appointment with her primary care doctor today but did not feel well and felt short of breath so she came to the ED instead.  Patient denies any chest pain or syncopal-like episodes.  She denies any cough or fever.  She denies any leg swelling or calf tenderness.  She states that she is on Lasix as needed.  She has been taking her Lasix over the past several days which seems to be helping her symptoms.  Patient takes Eliquis for A. fib.  She has not missed a dose.  She does have a cardiologist and pulmonologist but she has not seen recently.  Pt denies any fever, chill, ha, vision changes, lightheadedness, dizziness, congestion, neck pain, cp, cough, abd pain, n/v/d, urinary symptoms, change in bowel habits, melena, hematochezia, lower extremity paresthesias.  Past Medical History:  Diagnosis Date  . Asthma   . Atrial fibrillation ([place])   . CHF (congestive heart failure) ([place])   . COPD (chronic obstructive pulmonary disease) ([place])   . H/O: hysterectomy   . Hypertension   . LBBB (left bundle branch block)    per discharge note 2003  . Mitral regurgitation    MILD  . MVP (mitral valve prolapse)   . Pacemaker   . Pulmonary hypertension ([place])    MILD TO MODERATE BY ECHO  . PVC  (premature ventricular contraction)   . Rheumatoid arthritis(714.0)   . S/P Maze operation for atrial fibrillation 05/18/2012   Complete biatrial lesion set using cryothermy with oversewing of LA appendage  . S/P mitral valve replacement with bioprosthetic valve 05/18/2012   23mm Edwards Magna Mitral pericardial bioprosthesis     Patient Active Problem List   Diagnosis Date Noted  . Pancytopenia ([place]) 05/21/2018  . TGA (transient global amnesia) 05/19/2018  . Essential hypertension 12/11/2017  . Medication management 12/11/2017  . Pulmonary HTN ([place]) 12/30/2016  . Protein calorie malnutrition ([place]) 08/22/2012  . CAP (community acquired pneumonia) 08/21/2012  . Hypokalemia 08/21/2012  . SOB (shortness of breath) 08/20/2012  . FTT (failure to thrive) in adult 08/20/2012  . Leucocytosis 08/20/2012  . Atrial flutter ([place]) 06/24/2012  . CKD (chronic kidney disease), stage III ([place]) 06/24/2012  . Pleural effusion 06/24/2012  . [employer] 05/25/2012  . Bradycardia 05/21/2012  . S/P mitral valve replacement with bioprosthetic valve 05/18/2012  . S/P Maze operation for atrial fibrillation 05/18/2012  . Severe mitral regurgitation 05/17/2012  . A-fib ([place]) 05/17/2012  . MR (mitral regurgitation) 05/03/2012  . Fatigue 01/29/2012  . Acute on chronic diastolic heart failure ([place]) 06/10/2011  . CKD (chronic kidney disease) 06/03/2011  . Encounter for long-term (current) use of anticoagulants 05/26/2011  . Atrial fibrillation ([place]) 05/19/2011  . CHEST PAIN UNSPECIFIED 08/28/2010  . ABNORMAL ELECTROCARDIOGRAM 11/14/2009  . Mitral Regurgitation  11/12/2009  . PULMONARY HYPERTENSION 11/12/2009  . Mitral valve disorder 11/12/2009  . SINUSITIS, CHRONIC 06/12/2007  . ALLERGIC RHINITIS 06/12/2007  . BRONCHITIS 06/12/2007  . COPD 06/12/2007    Past Surgical History:  Procedure Laterality Date  . ABDOMINAL HYSTERECTOMY    . APPENDECTOMY    . CATARACT EXTRACTION    .  CHOLECYSTECTOMY    . hysterectomy    . MAZE  05/18/2012   Procedure: MAZE;  Surgeon: Rexene Alberts, MD;  Location: [facility];  Service: Open Heart Surgery;  Laterality: Right;  . MITRAL VALVE REPLACEMENT  05/18/2012   Procedure: MINIMALLY INVASIVE MITRAL VALVE (MV) REPLACEMENT;  Surgeon: Rexene Alberts, MD;  Location: [facility];  Service: Open Heart Surgery;  Laterality: N/A;  . OVARIAN CYST REMOVAL    . PACEMAKER INSERTION    . PERMANENT PACEMAKER INSERTION N/A 05/21/2012   Procedure: PERMANENT PACEMAKER INSERTION;  Surgeon: Evans Lance, MD;  Location: [facility] CATH LAB;  Service: Cardiovascular;  Laterality: N/A;  . TEE WITHOUT CARDIOVERSION  02/09/2012   Procedure: TRANSESOPHAGEAL ECHOCARDIOGRAM (TEE);  Surgeon: Fay Records, MD;  Location: [facility] ENDOSCOPY;  Service: Cardiovascular;  Laterality: N/A;     OB History   No obstetric history on file.      Home Medications    Prior to Admission medications   Medication Sig Start Date End Date Taking? Authorizing Provider  Calcium Carbonate-Vitamin D (CALTRATE 600+D) 600-400 MG-UNIT per tablet Take 1 tablet by mouth daily.    Yes [provider]  ELIQUIS 2.5 MG TABS tablet TAKE 1 TABLET BY MOUTH TWICE A DAY 01/15/18  Yes Minus Breeding, MD  ENSURE PLUS (ENSURE PLUS) LIQD Take 1 Can by mouth daily.   Yes [provider]  feeding supplement (BOOST HIGH PROTEIN) LIQD Take 1 Container by mouth daily.   Yes [provider]  furosemide (LASIX) 20 MG tablet Take 20 mg by mouth 3 (three) times a week. TAKE MON, WED AND Friday, OK TO TAKE PRN SWELLING   Yes [provider]  metoprolol succinate (TOPROL-XL) 25 MG 24 hr tablet TAKE 1 TABLET BY MOUTH TWICE A DAY 06/30/18  Yes Hochrein, Jeneen Rinks, MD  montelukast (SINGULAIR) 10 MG tablet Take 10 mg by mouth at bedtime.  12/31/12  Yes [provider]  Multiple Vitamins-Minerals (PRESERVISION AREDS PO) Take 1 tablet by mouth 2 (two) times daily. Preservision Areds    Yes  [provider]  predniSONE (DELTASONE) 5 MG tablet Take 2.5 mg by mouth every other day.    Yes [provider]  nitroGLYCERIN (NITROSTAT) 0.4 MG SL tablet Place 0.4 mg under the tongue every 5 (five) minutes as needed for chest pain (x 3 tabs daily).    [provider]    Family History History reviewed. No pertinent family history.  Social History Social History   Tobacco Use  . Smoking status: Never Smoker  . Smokeless tobacco: Never Used  Substance Use Topics  . Alcohol use: No  . Drug use: No     Allergies   Promethazine hcl; Septra [sulfamethoxazole-trimethoprim]; Sulfonamide derivatives; and Tequin   Review of Systems Review of Systems  All other systems reviewed and are negative.    Physical Exam Updated Vital Signs BP (!) 115/57 (BP Location: Left Arm)   Pulse (!) 59   Temp (!) 97.4 F (36.3 C) (Oral)   Resp 16   SpO2 98%   Physical Exam Vitals signs and nursing note reviewed.  Constitutional:  General: She is not in acute distress.    Appearance: She is well-developed. She is not toxic-appearing or diaphoretic.  HENT:     Head: Normocephalic and atraumatic.     Nose: Nose normal.  Eyes:     General:        Right eye: No discharge.        Left eye: No discharge.     Conjunctiva/sclera: Conjunctivae normal.     Pupils: Pupils are equal, round, and reactive to light.  Neck:     Musculoskeletal: Normal range of motion and neck supple.     Vascular: No JVD.     Trachea: No tracheal deviation.  Cardiovascular:     Rate and Rhythm: Normal rate and regular rhythm.     Heart sounds: Murmur present.  Pulmonary:     Effort: Pulmonary effort is normal. No respiratory distress.     Breath sounds: Normal breath sounds. No decreased breath sounds, wheezing, rhonchi or rales.  Chest:     Chest wall: No tenderness.  Abdominal:     General: Bowel sounds are normal. There is no distension.     Palpations: Abdomen is soft.      Tenderness: There is no abdominal tenderness. There is no guarding or rebound.  Musculoskeletal: Normal range of motion.     Right lower leg: She exhibits no tenderness. No edema.     Left lower leg: She exhibits no tenderness.     Comments: No lower extremity edema or calf tenderness.  Lymphadenopathy:     Cervical: No cervical adenopathy.  Skin:    General: Skin is warm and dry.     Capillary Refill: Capillary refill takes less than 2 seconds.  Neurological:     Mental Status: She is alert and oriented to person, place, and time.  Psychiatric:        Mood and Affect: Mood normal.        Behavior: Behavior normal.        Thought Content: Thought content normal.        Judgment: Judgment normal.      ED Treatments / Results  Labs (all labs ordered are listed, but only abnormal results are displayed) Labs Reviewed  BRAIN NATRIURETIC PEPTIDE - Abnormal; Notable for the following components:      Result Value   B Natriuretic Peptide 492.8 (*)    All other components within normal limits  CBC WITH DIFFERENTIAL/PLATELET - Abnormal; Notable for the following components:   WBC 2.5 (*)    RBC 3.57 (*)    Hemoglobin 10.3 (*)    HCT 33.7 (*)    Platelets 91 (*)    Neutro Abs 1.0 (*)    All other components within normal limits  COMPREHENSIVE METABOLIC PANEL - Abnormal; Notable for the following components:   Glucose, Bld 101 (*)    Calcium 8.3 (*)    GFR calc non Af Amer 56 (*)    All other components within normal limits  TROPONIN I    EKG EKG Interpretation  Date/Time:  Wednesday July 07 2018 14:42:25 EST Ventricular Rate:  60 PR Interval:  182 QRS Duration: 164 QT Interval:  538 QTC Calculation: 538 R Axis:   -163 Text Interpretation:  AV dual-paced rhythm Abnormal ECG no change from previous Confirmed by Charlesetta Shanks [phone]) on 07/07/2018 4:45:01 PM   Radiology No results found.  FINDINGS: Stable cardiomegaly with moderate aortic atherosclerosis.  No aneurysm. Trace pleural effusions are identified bilaterally. Patient  is status post mitral valve replacement. Left-sided pacemaker apparatus with leads in the right atrium right ventricle are again noted and unchanged. Small surgical clips are seen projecting over the mid right hemithorax. No acute nor suspicious osseous abnormality.  IMPRESSION: 1. Stable cardiomegaly with aortic atherosclerosis. 2. No active pulmonary disease. 3. Stable small pleural effusions are identified bilaterally. Procedures Procedures (including critical care time)  Medications Ordered in ED Medications - No data to display   Initial Impression / Assessment and Plan / ED Course  I have reviewed the triage vital signs and the nursing notes.  Pertinent labs & imaging results that were available during my care of the patient were reviewed by me and considered in my medical decision making (see chart for details).     Patient presents the ED for evaluation of exertional dyspnea that is been worsening for the past several months.  Patient denies any other associated symptoms.  On exam she is overall well-appearing and nontoxic.  Vital signs are reassuring.  Patient afebrile.  No hypoxia, tachypnea or tachycardia noted.  Lungs are clear to auscultation bilaterally.  Patient does have 2 out of 6 systolic ejection murmur noted.  Patient has no lower extremity edema.  Lab work reassuring.  Patient's BNP is similar to prior.  No leukocytosis.  Globin at baseline.  No significant electrolyte derangement.  Troponin was negative.  EKG with paced rhythm.  Patient ambulated in the ED with saturations above 95%.  She has no significant tachypnea noted.  Patient has no signs of acute exacerbation of congestive heart failure.  Presentation not consistent with ACS or PE and patient is on blood thinners.  Feel this can be seen in the outpatient setting.  No signs patient has immediate hospitalization or intervention at this  time.  Patient seen by my attending Dr. Vallery Ridge who is greatly above plan.  Patient to follow-up with cardiology and pulmonology in the outpatient setting.  Pt is hemodynamically stable, in NAD, & able to ambulate in the ED. Evaluation does not show pathology that would require ongoing emergent intervention or inpatient treatment. I explained the diagnosis to the patient. Pain has been managed & has no complaints prior to dc. Pt is comfortable with above plan and is stable for discharge at this time. All questions were answered prior to disposition. Strict return precautions for f/u to the ED were discussed. Encouraged follow up with PCP.    Final Clinical Impressions(s) / ED Diagnoses   Final diagnoses:  Dyspnea, unspecified type    ED Discharge Orders    None       Doristine Devoid, PA-C 07/18/18 1523    Tegeler, Gwenyth Allegra, MD 07/19/18 [phone]

## 2018-07-21 ENCOUNTER — Other Ambulatory Visit: Payer: Self-pay | Admitting: Cardiology

## 2018-08-11 ENCOUNTER — Other Ambulatory Visit: Payer: Self-pay | Admitting: Cardiology

## 2018-08-31 NOTE — Telephone Encounter (Signed)
No show

## 2018-10-08 ENCOUNTER — Telehealth: Payer: Self-pay | Admitting: Cardiology

## 2018-10-08 NOTE — Telephone Encounter (Signed)
New message   Pt c/o Shortness Of Breath: STAT if SOB developed within the last 24 hours or pt is noticeably SOB on the phone  1. Are you currently SOB (can you hear that pt is SOB on the phone)? No  2. How long have you been experiencing SOB? 2-3 days   3. Are you SOB when sitting or when up moving around? Moving around  4. Are you currently experiencing any other symptoms? Feeling tired, doesn't feel like doing anything

## 2018-10-08 NOTE — Telephone Encounter (Signed)
Attempted to contact pt. Unable to leave message as phone continue to ring.  

## 2018-10-11 NOTE — Telephone Encounter (Signed)
Incoming call from pt. Pt c/o generalized weakness, shakiness, feeling 'worn out', and DOE especially when ambulating from room to room and to mailbox for several days. It appears that per 12/4 ED note, pt has had Hx c/o fatigue. She states SOB not as significant as what she felt when last admitted to ED. Pt states she takes Eliquis, Lasix, and Toprol-XL as prescribed. Pt denies swelling at any site. Pt unable to measure BP and HR at home. She states appetite decreased, but that she feels she eats enough food. Pt states she drinks about 3-4 glasses of fluid daily and tries to drink half a glass of her Boost and Ensure daily.  Advised pt that per OV note on 07/12/2018 Jory Sims, DNP recommended that pt take Lasix 20 mg MWF and "take additional lasix should she have episodes of dyspnea or wheezing." Pt states she has not taken her Monday dose of Lasix today. Advised to take Lasix this afternoon and may take additional dose tomorrow morning PRN. Advised pt to keep appt for 3/10 and to report to ED/call 911 if symptoms increase in severity or if pt presyncopal. Pt verbalized understanding

## 2018-10-11 NOTE — Telephone Encounter (Signed)
Thank you :)

## 2018-10-12 ENCOUNTER — Ambulatory Visit (INDEPENDENT_AMBULATORY_CARE_PROVIDER_SITE_OTHER): Payer: Medicare Other | Admitting: Adult Health

## 2018-10-12 ENCOUNTER — Encounter: Payer: Self-pay | Admitting: Adult Health

## 2018-10-12 ENCOUNTER — Other Ambulatory Visit: Payer: Self-pay

## 2018-10-12 VITALS — BP 138/64 | HR 70 | Ht 59.0 in | Wt 105.0 lb

## 2018-10-12 DIAGNOSIS — Z79899 Other long term (current) drug therapy: Secondary | ICD-10-CM

## 2018-10-12 DIAGNOSIS — R42 Dizziness and giddiness: Secondary | ICD-10-CM

## 2018-10-12 DIAGNOSIS — I480 Paroxysmal atrial fibrillation: Secondary | ICD-10-CM | POA: Diagnosis not present

## 2018-10-12 DIAGNOSIS — Z950 Presence of cardiac pacemaker: Secondary | ICD-10-CM | POA: Diagnosis not present

## 2018-10-12 DIAGNOSIS — R531 Weakness: Secondary | ICD-10-CM | POA: Diagnosis not present

## 2018-10-12 NOTE — Progress Notes (Signed)
Cardiology Office Note   Date:  10/12/2018   ID:  Nancy Bush, DOB [birth year], MRN [id number]  PCP:  Lawerance Cruel, MD  Cardiologist: [facility]  Chief Complaint  Patient presents with  . Fatigue  . Shortness of Breath     History of Present Illness: Nancy Bush is a 85 y.o. female who presents for ongoing assessment and management of mitral valve disease, s/p MV replacement with bioprosthetic valve and MAZE procedure, October 2013, symptomatic bradycardia s/p PPM implantation, and PAF. On last office visit on last 07/12/2018, she has complaints of worsening DOE. She was changed on her lasix to MWF, 20 mg.   She called our office on 10/11/2018 with complaints of generalized weakness, feeling "worn out' with DOE, especially when ambulating from room to room and to the mailbox. She had not taken her lasix that day yet. She was advised to take her medications as directed and is to be seen today for follow up on symptoms.   She continues to feel tired. She states she is DOE, but O2 Sat after walking to exam room remained 98%. She denies chest pain or edema.   Past Medical History:  Diagnosis Date  . Asthma   . Atrial fibrillation ([place])   . CHF (congestive heart failure) ([place])   . COPD (chronic obstructive pulmonary disease) ([place])   . H/O: hysterectomy   . Hypertension   . LBBB (left bundle branch block)    per discharge note 2003  . Mitral regurgitation    MILD  . MVP (mitral valve prolapse)   . Pacemaker   . Pulmonary hypertension ([place])    MILD TO MODERATE BY ECHO  . PVC (premature ventricular contraction)   . Rheumatoid arthritis(714.0)   . S/P Maze operation for atrial fibrillation 05/18/2012   Complete biatrial lesion set using cryothermy with oversewing of LA appendage  . S/P mitral valve replacement with bioprosthetic valve 05/18/2012   52mm Edwards Magna Mitral pericardial bioprosthesis     Past Surgical History:  Procedure Laterality Date  . ABDOMINAL  HYSTERECTOMY    . APPENDECTOMY    . CATARACT EXTRACTION    . CHOLECYSTECTOMY    . hysterectomy    . MAZE  05/18/2012   Procedure: MAZE;  Surgeon: Rexene Alberts, MD;  Location: [place];  Service: Open Heart Surgery;  Laterality: Right;  . MITRAL VALVE REPLACEMENT  05/18/2012   Procedure: MINIMALLY INVASIVE MITRAL VALVE (MV) REPLACEMENT;  Surgeon: Rexene Alberts, MD;  Location: [place];  Service: Open Heart Surgery;  Laterality: N/A;  . OVARIAN CYST REMOVAL    . PACEMAKER INSERTION    . PERMANENT PACEMAKER INSERTION N/A 05/21/2012   Procedure: PERMANENT PACEMAKER INSERTION;  Surgeon: Evans Lance, MD;  Location: [facility] CATH LAB;  Service: Cardiovascular;  Laterality: N/A;  . TEE WITHOUT CARDIOVERSION  02/09/2012   Procedure: TRANSESOPHAGEAL ECHOCARDIOGRAM (TEE);  Surgeon: Fay Records, MD;  Location: [facility] ENDOSCOPY;  Service: Cardiovascular;  Laterality: N/A;     Current Outpatient Medications  Medication Sig Dispense Refill  . Calcium Carbonate-Vitamin D (CALTRATE 600+D) 600-400 MG-UNIT per tablet Take 1 tablet by mouth daily.     Marland Kitchen ELIQUIS 2.5 MG TABS tablet TAKE 1 TABLET BY MOUTH TWICE A DAY 180 tablet 1  . ENSURE PLUS (ENSURE PLUS) LIQD Take 1 Can by mouth daily.    . feeding supplement (BOOST HIGH PROTEIN) LIQD Take 1 Container by mouth daily.    . furosemide (LASIX) 20 MG tablet  Take 20 mg by mouth 3 (three) times a week. TAKE MON, WED AND Friday, OK TO TAKE PRN SWELLING    . metoprolol succinate (TOPROL-XL) 25 MG 24 hr tablet TAKE 1 TABLET BY MOUTH TWICE A DAY 180 tablet 1  . montelukast (SINGULAIR) 10 MG tablet Take 10 mg by mouth at bedtime.     . Multiple Vitamins-Minerals (PRESERVISION AREDS PO) Take 1 tablet by mouth 2 (two) times daily. Preservision Areds     . nitroGLYCERIN (NITROSTAT) 0.4 MG SL tablet Place 0.4 mg under the tongue every 5 (five) minutes as needed for chest pain (x 3 tabs daily).    . predniSONE (DELTASONE) 5 MG tablet Take 2.5 mg by mouth every other day.      No  current facility-administered medications for this visit.     Allergies:   Promethazine hcl; Septra [sulfamethoxazole-trimethoprim]; Sulfonamide derivatives; and Tequin    Social History:  The patient  reports that she has never smoked. She has never used smokeless tobacco. She reports that she does not drink alcohol or use drugs.   Family History:  The patient's family history is not on file.    ROS: All other systems are reviewed and negative. Unless otherwise mentioned in H&P    PHYSICAL EXAM: VS:  BP 138/64   Pulse 70   Ht 4\' 11"  (1.499 m)   Wt 105 lb (47.6 kg)   SpO2 98%   BMI 21.21 kg/m  , BMI Body mass index is 21.21 kg/m. GEN: Well nourished, well developed, in no acute distress HEENT: normal Neck: no JVD, carotid bruits, or masses Cardiac: RRR; no murmurs, rubs, or gallops,no edema  Respiratory:  Clear to auscultation bilaterally, normal work of breathing GI: soft, nontender, nondistended, + BS MS: no deformity or atrophy Skin: warm and dry, no rash Neuro:  Strength and sensation are intact Psych: euthymic mood, full affect   EKG: Paced rhythm with PVC's rate of 66 bpm Recent Labs: 05/18/2018: Magnesium 2.0 05/21/2018: TSH 4.873 07/07/2018: ALT 11; B Natriuretic Peptide 492.8; BUN 10; Creatinine, Ser 0.90; Hemoglobin 10.3; Platelets 91; Potassium 3.7; Sodium 137    Lipid Panel    Component Value Date/Time   CHOL 98 06/05/18 0417   TRIG 54 June 05, 2018 0417   HDL 39 (L) June 05, 2018 0417   CHOLHDL 2.5 06/05/2018 0417   VLDL 11 06-05-18 0417   LDLCALC 48 June 05, 2018 0417      Wt Readings from Last 3 Encounters:  10/12/18 105 lb (47.6 kg)  07/12/18 104 lb 9.6 oz (47.4 kg)  06/11/18 103 lb (46.7 kg)      Other studies Reviewed: Echocardiogram 06-05-18 Left ventricle: The cavity size was normal. Wall thickness was increased in a pattern of mild LVH. Systolic function was normal. The estimated ejection fraction was in the range of 60% to  65%. Wall motion was normal; there were no regional wall motion abnormalities. The study was not technically sufficient to allow evaluation of LV diastolic dysfunction due to atrial fibrillation. - Ventricular septum: D-shaped interventricular septum suggestive of RV pressure/volume overload. - Aortic valve: Trileaflet; moderately calcified leaflets. There was no stenosis. - Mitral valve: Bioprosthetic mitral valve. There does not appear to be significant stenosis or regurgitation. Pressure half-time: 70 ms. Mean gradient (D): 5 mm Hg. Valve area by pressure half-time: 3.14 cm^2. - Left atrium: The atrium was severely dilated. - Right ventricle: The cavity size was moderately dilated. Pacer wire or catheter noted in right ventricle. Systolic function was mildly reduced. -  Right atrium: The atrium was severely dilated. - Tricuspid valve: There was severe regurgitation. There was systolic flow reversal in the hepatic vein doppler pattern. Peak RV-RA gradient (S): 56 mm Hg. - Pulmonary arteries: PA peak pressure: 71 mm Hg (S). - Systemic veins: IVC measured 2.4 cm with < 50% respirophasic variation, suggesting RA pressure 15 mmHg.  Impressions:  - Normal LV size with mild LV hypertrophy. EF 60-65% . D-shaped interventricular septum suggesting RV pressure/volume overload. Moderately dilated RV with mildly decreased systolic function. Bioprosthetic mitral valve appeared to function normally. Severe tricuspid regurgitation. Severe pulmonary hypertension.   ASSESSMENT AND PLAN:  1. Generalized fatigue: I will check her for a UTI, anemia, and for dehydration with labs. I will have her stop lasix for now in case she is dehydrated. Will make further recommendations when labs are resulted.   2. Symptomatic Bradycardia: Continue PPM checks per protocol  3. PAF: Will continue Eliquis for stroke prevention and metoprolol for rate control.  Current  medicines are reviewed at length with the patient today.    Labs/ tests ordered today include: UA, CBC, and BMET Phill Myron. [place] Pugh, ANP, AACC   10/12/2018 2:31 PM    [facility] [address] Office [phone] Fax [phone]

## 2018-10-12 NOTE — Patient Instructions (Signed)
Labwork: UA, CBC AND BMET TODAY HERE IN OUR OFFICE AT [facility]   Take the provided lab slips with you to the lab for your blood draw.   When you have your labs (blood work) drawn today and your tests are completely normal, you will receive your results only by MyChart Message (if you have MyChart) -OR-  A paper copy in the mail.  If you have any lab test that is abnormal or we need to change your treatment, we will call you to review these results.  Follow-Up: Jory Sims, DNP, AACC WILL REVIEW LABS AND WE WILL CALL WITH RESULTS AND FOLLOW UP INFORMATION. At [facility], you and your health needs are our priority.  As part of our continuing mission to provide you with exceptional heart care, we have created designated Provider Care Teams.  These Care Teams include your primary Cardiologist (physician) and Advanced Practice Providers (APPs -  Physician Assistants and Nurse Practitioners) who all work together to provide you with the care you need, when you need it.  Thank you for choosing [facility] at [facility]!!

## 2018-10-13 LAB — BASIC METABOLIC PANEL
BUN / CREAT RATIO: 13 (ref 12–28)
BUN: 16 mg/dL (ref 10–36)
CO2: 20 mmol/L (ref 20–29)
CREATININE: 1.22 mg/dL — AB (ref 0.57–1.00)
Calcium: 9.7 mg/dL (ref 8.7–10.3)
Chloride: 99 mmol/L (ref 96–106)
GFR calc Af Amer: 45 mL/min/{1.73_m2} — ABNORMAL LOW (ref >59)
GFR, EST NON AFRICAN AMERICAN: 39 mL/min/{1.73_m2} — AB (ref >59)
Glucose: 172 mg/dL — ABNORMAL HIGH (ref 65–99)
Potassium: 4.4 mmol/L (ref 3.5–5.2)
SODIUM: 137 mmol/L (ref 134–144)

## 2018-10-13 LAB — CBC
HEMATOCRIT: 32.7 % — AB (ref 34.0–46.6)
HEMOGLOBIN: 10.8 g/dL — AB (ref 11.1–15.9)
MCH: 29.9 pg (ref 26.6–33.0)
MCHC: 33 g/dL (ref 31.5–35.7)
MCV: 91 fL (ref 79–97)
Platelets: 93 10*3/uL — CL (ref 150–450)
RBC: 3.61 x10E6/uL — ABNORMAL LOW (ref 3.77–5.28)
RDW: 13.7 % (ref 11.7–15.4)
WBC: 4.3 10*3/uL (ref 3.4–10.8)

## 2018-10-13 LAB — URINALYSIS
Bilirubin, UA: NEGATIVE
GLUCOSE, UA: NEGATIVE
Ketones, UA: NEGATIVE
Nitrite, UA: NEGATIVE
PH UA: 5.5 (ref 5.0–7.5)
RBC, UA: NEGATIVE
Specific Gravity, UA: 1.009 (ref 1.005–1.030)
Urobilinogen, Ur: 0.2 mg/dL (ref 0.2–1.0)

## 2018-12-21 ENCOUNTER — Telehealth: Payer: Medicare Other | Admitting: Cardiology

## 2019-01-10 ENCOUNTER — Telehealth: Payer: Self-pay | Admitting: *Deleted

## 2019-01-10 NOTE — Telephone Encounter (Signed)
The pt answered No to all the Covid-19 questions. The pt wants to reschedule her device clinic appointment that was scheduled for today at 11:35 am. The pt forgotten about the appointment and do not have a ride. I told her I will have the scheduler reschedule the appointment and give her a call back.

## 2019-01-10 NOTE — Telephone Encounter (Signed)
    COVID-19 Pre-Screening Questions:  . In the past 7 to 10 days have you had a cough,  shortness of breath, headache, congestion, fever (100 or greater) body aches, chills, sore throat, or sudden loss of taste or sense of smell? . Have you been around anyone with known Covid 19? Marland Kitchen Have you been around anyone who is awaiting Covid 19 test results in the past 7 to 10 days? . Have you been around anyone who has been exposed to Covid 19, or has mentioned symptoms of Covid 19 within the past 7 to 10 days?  If you have any concerns/questions about symptoms patients report during screening (either on the phone or at threshold). Contact the provider seeing the patient or DOD for further guidance.  If neither are available contact a member of the leadership team.   [facility] requesting call back to DC.

## 2019-01-12 NOTE — Telephone Encounter (Signed)
Spoke with patient. She declines to reschedule her f/u with DC at this time due to [place]. She is agreeable to receiving a Latitude monitor. Will reach out to [employer] rep to request further assistance. Pt is agreeable to plan and denies questions at this time.

## 2019-01-18 ENCOUNTER — Ambulatory Visit (INDEPENDENT_AMBULATORY_CARE_PROVIDER_SITE_OTHER): Payer: Medicare Other | Admitting: *Deleted

## 2019-01-18 DIAGNOSIS — R001 Bradycardia, unspecified: Secondary | ICD-10-CM | POA: Diagnosis not present

## 2019-01-18 NOTE — Telephone Encounter (Signed)
[employer] rep delivered new monitor on 01/14/19. Pt has not yet sent transmission.

## 2019-01-18 NOTE — Telephone Encounter (Signed)
LMOVM for pt to return call. Calling to help pt set up her  [place] home monitor.

## 2019-01-18 NOTE — Telephone Encounter (Signed)
I tried to help pt unsuccessfully. I gave her the number to [employer] support to get additional help. She states she is going to try to call [place] [facility] then try tech support.

## 2019-01-19 NOTE — Telephone Encounter (Signed)
LMOVM for pt to return call. Did patient call tech support?

## 2019-01-20 NOTE — Telephone Encounter (Signed)
The pt tried to send the transmission again. All 3 lines on the left hand side lit up. I told her maybe we tried to send too many times today. Either myself or Pamala Hurry will give her a call 1st thing in the morning. The pt verbalized understanding.

## 2019-01-20 NOTE — Telephone Encounter (Signed)
LMOVM for pt to return call. Checking to see if pt called tech support. Home monitor is still not communicating.

## 2019-01-21 LAB — CUP PACEART REMOTE DEVICE CHECK
Battery Remaining Longevity: 72 mo
Battery Remaining Percentage: 75 %
Brady Statistic RA Percent Paced: 84 %
Brady Statistic RV Percent Paced: 96 %
Date Time Interrogation Session: 20200616185400
Implantable Lead Implant Date: 20131018
Implantable Lead Implant Date: 20131018
Implantable Lead Location: 753859
Implantable Lead Location: 753860
Implantable Lead Model: 4135
Implantable Lead Model: 4136
Implantable Lead Serial Number: 29232066
Implantable Lead Serial Number: 29233089
Implantable Pulse Generator Implant Date: 20131018
Lead Channel Impedance Value: 503 Ohm
Lead Channel Impedance Value: 610 Ohm
Lead Channel Pacing Threshold Amplitude: 0.5 V
Lead Channel Pacing Threshold Amplitude: 0.7 V
Lead Channel Pacing Threshold Pulse Width: 0.4 ms
Lead Channel Pacing Threshold Pulse Width: 0.4 ms
Lead Channel Setting Pacing Amplitude: 1.2 V
Lead Channel Setting Pacing Amplitude: 2 V
Lead Channel Setting Pacing Pulse Width: 0.4 ms
Lead Channel Setting Sensing Sensitivity: 2.5 mV
Pulse Gen Serial Number: 111608

## 2019-01-21 NOTE — Telephone Encounter (Signed)
Spoke w/ pt and informed her that her home monitor is working and transmission has been received. Pt verbalized understanding.

## 2019-01-27 ENCOUNTER — Telehealth: Payer: Self-pay

## 2019-01-27 DIAGNOSIS — R609 Edema, unspecified: Secondary | ICD-10-CM | POA: Diagnosis not present

## 2019-01-27 NOTE — Telephone Encounter (Signed)
Appointment 01-27-2019 at 3pm.      COVID-19 Pre-Screening Questions:  . In the past 7 to 10 days have you had a cough,  shortness of breath, headache, congestion, fever (100 or greater) body aches, chills, sore throat, or sudden loss of taste or sense of smell? . Have you been around anyone with known Covid 19. . Have you been around anyone who is awaiting Covid 19 test results in the past 7 to 10 days? . Have you been around anyone who has been exposed to Covid 19, or has mentioned symptoms of Covid 19 within the past 7 to 10 days?  If you have any concerns/questions about symptoms patients report during screening (either on the phone or at threshold). Contact the provider seeing the patient or DOD for further guidance.  If neither are available contact a member of the leadership team.          LMOVM with my direct office number.

## 2019-01-27 NOTE — Telephone Encounter (Signed)
Pt returned my phone call. Pt did not need her device clinic appointment since we received her transmission with her home monitor. I cancelled the appointment.

## 2019-01-29 ENCOUNTER — Encounter: Payer: Self-pay | Admitting: Cardiology

## 2019-01-29 NOTE — Progress Notes (Signed)
Remote pacemaker transmission.   

## 2019-03-01 ENCOUNTER — Other Ambulatory Visit: Payer: Self-pay | Admitting: Cardiology

## 2019-03-17 NOTE — Progress Notes (Signed)
Virtual Visit via Telephone Note   This visit type was conducted due to national recommendations for restrictions regarding the COVID-19 Pandemic (e.g. social distancing) in an effort to limit this patient's exposure and mitigate transmission in our community.  Due to her co-morbid illnesses, this patient is at least at moderate risk for complications without adequate follow up.  This format is felt to be most appropriate for this patient at this time.  The patient did not have access to video technology/had technical difficulties with video requiring transitioning to audio format only (telephone).  All issues noted in this document were discussed and addressed.  No physical exam could be performed with this format.  Please refer to the patient's chart for her  consent to telehealth for Nancy Bush.   Date:  03/20/2019   ID:  Nancy Bush, DOB [birth year], MRN [id number]  Patient Location: Home Provider Location: Home  PCP:  Lawerance Cruel, MD  Cardiologist:  Minus Breeding, MD  Electrophysiologist:  None   Evaluation Performed:  Follow-Up Visit  Chief Complaint:  Swelling  History of Present Illness:    Nancy Bush is a 85 y.o. female with  female who presents for ongoing assessment and management of mitral valve disease, s/p MV replacement with bioprosthetic valve and MAZE procedure, October 2013, symptomatic bradycardia s/p PPM implantation, and PAF. She has had DOE and weakness.  However, today on this phone visit she sounds like she is doing relatively well.  She had some swelling on her arm the left one and also some of her legs.  She said her diuretics are dosed by her primary provider.  She is not describing PND or orthopnea.  She is not describing new palpitations, presyncope or syncope.  She is not been having any new chest pressure, neck or arm discomfort.  It sounds like she is getting around relatively well and staying isolated because of the virus.  Her niece  checks her blood pressure and it has been running low.  She continues to feel tired. She states she is DOE, but O2 Sat after walking to exam room remained 98%. She denies chest pain or edema.   The patient does not have symptoms concerning for COVID-19 infection (fever, chills, cough, or new shortness of breath).    Past Medical History:  Diagnosis Date   Asthma    Atrial fibrillation ([place])    CHF (congestive heart failure) ([place])    COPD (chronic obstructive pulmonary disease) ([place])    H/O: hysterectomy    Hypertension    LBBB (left bundle branch block)    per discharge note 2003   Mitral regurgitation    MILD   MVP (mitral valve prolapse)    Pacemaker    Pulmonary hypertension ([place])    MILD TO MODERATE BY ECHO   PVC (premature ventricular contraction)    Rheumatoid arthritis(714.0)    S/P Maze operation for atrial fibrillation 05/18/2012   Complete biatrial lesion set using cryothermy with oversewing of LA appendage   S/P mitral valve replacement with bioprosthetic valve 05/18/2012   71mm Edwards Magna Mitral pericardial bioprosthesis    Past Surgical History:  Procedure Laterality Date   ABDOMINAL HYSTERECTOMY     APPENDECTOMY     CATARACT EXTRACTION     CHOLECYSTECTOMY     hysterectomy     MAZE  05/18/2012   Procedure: MAZE;  Surgeon: Rexene Alberts, MD;  Location: [place];  Service: Open Heart Surgery;  Laterality: Right;  MITRAL VALVE REPLACEMENT  05/18/2012   Procedure: MINIMALLY INVASIVE MITRAL VALVE (MV) REPLACEMENT;  Surgeon: Rexene Alberts, MD;  Location: [place];  Service: Open Heart Surgery;  Laterality: N/A;   OVARIAN CYST REMOVAL     PACEMAKER INSERTION     PERMANENT PACEMAKER INSERTION N/A 05/21/2012   Procedure: PERMANENT PACEMAKER INSERTION;  Surgeon: Evans Lance, MD;  Location: [facility] CATH LAB;  Service: Cardiovascular;  Laterality: N/A;   TEE WITHOUT CARDIOVERSION  02/09/2012   Procedure: TRANSESOPHAGEAL ECHOCARDIOGRAM (TEE);   Surgeon: Fay Records, MD;  Location: [facility] ENDOSCOPY;  Service: Cardiovascular;  Laterality: N/A;     Current Meds  Medication Sig   Calcium Carbonate-Vitamin D (CALTRATE 600+D) 600-400 MG-UNIT per tablet Take 1 tablet by mouth daily.    ELIQUIS 2.5 MG TABS tablet TAKE 1 TABLET BY MOUTH TWICE A DAY   ENSURE PLUS (ENSURE PLUS) LIQD Take 1 Can by mouth daily.   feeding supplement (BOOST HIGH PROTEIN) LIQD Take 1 Container by mouth daily.   furosemide (LASIX) 20 MG tablet Take 20 mg by mouth 3 (three) times daily. Pt takes 3 to 4 tabs daily/ WG/ TAKE MON, WED AND Friday, OK TO TAKE PRN SWELLING   metoprolol succinate (TOPROL-XL) 25 MG 24 hr tablet Take 25 mg by mouth daily.   montelukast (SINGULAIR) 10 MG tablet Take 10 mg by mouth at bedtime.    Multiple Vitamins-Minerals (PRESERVISION AREDS PO) Take 1 tablet by mouth 2 (two) times daily. Preservision Areds    predniSONE (DELTASONE) 5 MG tablet Take 2.5 mg by mouth every other day.    [DISCONTINUED] metoprolol succinate (TOPROL-XL) 25 MG 24 hr tablet TAKE 1 TABLET BY MOUTH TWICE A DAY (Patient taking differently: 25 mg daily. )     Allergies:   Promethazine hcl, Septra [sulfamethoxazole-trimethoprim], Sulfonamide derivatives, and Tequin   Social History   Tobacco Use   Smoking status: Never Smoker   Smokeless tobacco: Never Used  Substance Use Topics   Alcohol use: No   Drug use: No     Family Hx: The patient's family history is not on file.  ROS:   Please see the history of present illness.    As stated in the HPI and negative for all other systems.   Prior CV studies:   The following studies were reviewed today:  None  Labs/Other Tests and Data Reviewed:    EKG:  No ECG reviewed.  Recent Labs: 05/18/2018: Magnesium 2.0 05/21/2018: TSH 4.873 07/07/2018: ALT 11; B Natriuretic Peptide 492.8 10/12/2018: BUN 16; Creatinine, Ser 1.22; Hemoglobin 10.8; Platelets 93; Potassium 4.4; Sodium 137   Recent Lipid  Panel Lab Results  Component Value Date/Time   CHOL 98 05/20/2018 04:17 AM   TRIG 54 05/20/2018 04:17 AM   HDL 39 (L) 05/20/2018 04:17 AM   CHOLHDL 2.5 05/20/2018 04:17 AM   LDLCALC 48 05/20/2018 04:17 AM    Wt Readings from Last 3 Encounters:  03/18/19 105 lb (47.6 kg)  10/12/18 105 lb (47.6 kg)  07/12/18 104 lb 9.6 oz (47.4 kg)     Objective:    Vital Signs:  BP (!) 96/57    Pulse 60    Temp 99 F (37.2 C)    Ht 4\' 11"  (1.499 m)    Wt 105 lb (47.6 kg) Comment: Previous weight   BMI 21.21 kg/m    VITAL SIGNS:  reviewed  ASSESSMENT & PLAN:    PAF: The patient's had no symptomatic tachypalpitations.  She tolerates anticoagulation.  No change in therapy is indicated.  HYPOTENSION: She has had weakness.  Her blood pressures been running low.  We will reduce her metoprolol to 25 mg daily.  MVR:  This was stable on echo in October.  I will follow this clinically.  TR:   This is severe.  However, I will follow this clinically given her advanced age.  This may be contributing to lower extremity swelling.  COVID-19 Education: The signs and symptoms of COVID-19 were discussed with the patient and how to seek care for testing (follow up with PCP or arrange E-visit).  The importance of social distancing was discussed today.  Time:   Today, I have spent 16 minutes with the patient with telehealth technology discussing the above problems.     Medication Adjustments/Labs and Tests Ordered: Current medicines are reviewed at length with the patient today.  Concerns regarding medicines are outlined above.   Tests Ordered: No orders of the defined types were placed in this encounter.   Medication Changes: No orders of the defined types were placed in this encounter.   Follow Up:  Virtual Visit in 12 months.   Signed, Minus Breeding, MD  03/20/2019 7:10 PM    [facility]

## 2019-03-18 ENCOUNTER — Other Ambulatory Visit: Payer: Self-pay

## 2019-03-18 ENCOUNTER — Encounter: Payer: Self-pay | Admitting: Cardiology

## 2019-03-18 ENCOUNTER — Telehealth (INDEPENDENT_AMBULATORY_CARE_PROVIDER_SITE_OTHER): Payer: Medicare Other | Admitting: Cardiology

## 2019-03-18 VITALS — BP 96/57 | HR 60 | Temp 99.0°F | Ht 59.0 in | Wt 105.0 lb

## 2019-03-18 DIAGNOSIS — Z7189 Other specified counseling: Secondary | ICD-10-CM

## 2019-03-18 DIAGNOSIS — Z9889 Other specified postprocedural states: Secondary | ICD-10-CM | POA: Diagnosis not present

## 2019-03-18 DIAGNOSIS — I4891 Unspecified atrial fibrillation: Secondary | ICD-10-CM | POA: Diagnosis not present

## 2019-03-18 DIAGNOSIS — I959 Hypotension, unspecified: Secondary | ICD-10-CM

## 2019-03-18 NOTE — Patient Instructions (Addendum)
  Medication Instructions:  DECREASE YOUR METOPROLOL TO 25 MG DAILY   If you need a refill on your cardiac medications before your next appointment, please call your pharmacy.   Lab work: NONE  Testing/Procedures: NONE  Follow-Up: At [facility], you and your health needs are our priority.  As part of our continuing mission to provide you with exceptional heart care, we have created designated Provider Care Teams.  These Care Teams include your primary Cardiologist (physician) and Advanced Practice Providers (APPs -  Physician Assistants and Nurse Practitioners) who all work together to provide you with the care you need, when you need it. You will need a follow up appointment in 12 months TELEPHONE VISIT   Please call our office 2 months in advance to schedule this appointment.  You may see Minus Breeding, MD  or one of the following Advanced Practice Providers on your designated Care Team:   Rosaria Ferries, PA-C . Jory Sims, DNP, ANP

## 2019-03-20 ENCOUNTER — Encounter: Payer: Self-pay | Admitting: Cardiology

## 2019-03-20 DIAGNOSIS — Z7189 Other specified counseling: Secondary | ICD-10-CM | POA: Insufficient documentation

## 2019-04-19 ENCOUNTER — Ambulatory Visit (INDEPENDENT_AMBULATORY_CARE_PROVIDER_SITE_OTHER): Payer: Medicare Other | Admitting: *Deleted

## 2019-04-19 DIAGNOSIS — I4891 Unspecified atrial fibrillation: Secondary | ICD-10-CM | POA: Diagnosis not present

## 2019-04-19 DIAGNOSIS — I5033 Acute on chronic diastolic (congestive) heart failure: Secondary | ICD-10-CM

## 2019-04-19 DIAGNOSIS — I480 Paroxysmal atrial fibrillation: Secondary | ICD-10-CM | POA: Diagnosis not present

## 2019-04-19 DIAGNOSIS — N183 Chronic kidney disease, stage 3 (moderate): Secondary | ICD-10-CM | POA: Diagnosis not present

## 2019-04-19 DIAGNOSIS — D61818 Other pancytopenia: Secondary | ICD-10-CM | POA: Diagnosis not present

## 2019-04-19 DIAGNOSIS — M069 Rheumatoid arthritis, unspecified: Secondary | ICD-10-CM | POA: Diagnosis not present

## 2019-04-19 DIAGNOSIS — J45909 Unspecified asthma, uncomplicated: Secondary | ICD-10-CM | POA: Diagnosis not present

## 2019-04-19 DIAGNOSIS — I509 Heart failure, unspecified: Secondary | ICD-10-CM | POA: Diagnosis not present

## 2019-04-19 LAB — CUP PACEART REMOTE DEVICE CHECK
Battery Remaining Longevity: 66 mo
Battery Remaining Percentage: 70 %
Brady Statistic RA Percent Paced: 85 %
Brady Statistic RV Percent Paced: 97 %
Date Time Interrogation Session: 20200915062500
Implantable Lead Implant Date: 20131018
Implantable Lead Implant Date: 20131018
Implantable Lead Location: 753859
Implantable Lead Location: 753860
Implantable Lead Model: 4135
Implantable Lead Model: 4136
Implantable Lead Serial Number: 29232066
Implantable Lead Serial Number: 29233089
Implantable Pulse Generator Implant Date: 20131018
Lead Channel Impedance Value: 481 Ohm
Lead Channel Impedance Value: 590 Ohm
Lead Channel Pacing Threshold Amplitude: 0.5 V
Lead Channel Pacing Threshold Amplitude: 0.7 V
Lead Channel Pacing Threshold Pulse Width: 0.4 ms
Lead Channel Pacing Threshold Pulse Width: 0.4 ms
Lead Channel Setting Pacing Amplitude: 1.3 V
Lead Channel Setting Pacing Amplitude: 2 V
Lead Channel Setting Pacing Pulse Width: 0.4 ms
Lead Channel Setting Sensing Sensitivity: 2.5 mV
Pulse Gen Serial Number: 111608

## 2019-04-26 DIAGNOSIS — Z23 Encounter for immunization: Secondary | ICD-10-CM | POA: Diagnosis not present

## 2019-04-27 NOTE — Progress Notes (Signed)
Remote pacemaker transmission.   

## 2019-05-04 DIAGNOSIS — M069 Rheumatoid arthritis, unspecified: Secondary | ICD-10-CM | POA: Diagnosis not present

## 2019-05-04 DIAGNOSIS — N183 Chronic kidney disease, stage 3 (moderate): Secondary | ICD-10-CM | POA: Diagnosis not present

## 2019-05-04 DIAGNOSIS — Z0000 Encounter for general adult medical examination without abnormal findings: Secondary | ICD-10-CM | POA: Diagnosis not present

## 2019-05-04 DIAGNOSIS — I509 Heart failure, unspecified: Secondary | ICD-10-CM | POA: Diagnosis not present

## 2019-05-04 DIAGNOSIS — D61818 Other pancytopenia: Secondary | ICD-10-CM | POA: Diagnosis not present

## 2019-05-04 DIAGNOSIS — J45909 Unspecified asthma, uncomplicated: Secondary | ICD-10-CM | POA: Diagnosis not present

## 2019-05-04 DIAGNOSIS — I4891 Unspecified atrial fibrillation: Secondary | ICD-10-CM | POA: Diagnosis not present

## 2019-05-04 DIAGNOSIS — Z13220 Encounter for screening for lipoid disorders: Secondary | ICD-10-CM | POA: Diagnosis not present

## 2019-05-04 DIAGNOSIS — Z862 Personal history of diseases of the blood and blood-forming organs and certain disorders involving the immune mechanism: Secondary | ICD-10-CM | POA: Diagnosis not present

## 2019-05-04 DIAGNOSIS — R3915 Urgency of urination: Secondary | ICD-10-CM | POA: Diagnosis not present

## 2019-05-13 DIAGNOSIS — H35372 Puckering of macula, left eye: Secondary | ICD-10-CM | POA: Diagnosis not present

## 2019-05-13 DIAGNOSIS — H5213 Myopia, bilateral: Secondary | ICD-10-CM | POA: Diagnosis not present

## 2019-05-13 DIAGNOSIS — D3132 Benign neoplasm of left choroid: Secondary | ICD-10-CM | POA: Diagnosis not present

## 2019-05-13 DIAGNOSIS — H353131 Nonexudative age-related macular degeneration, bilateral, early dry stage: Secondary | ICD-10-CM | POA: Diagnosis not present

## 2019-06-09 DIAGNOSIS — H25811 Combined forms of age-related cataract, right eye: Secondary | ICD-10-CM | POA: Diagnosis not present

## 2019-06-09 DIAGNOSIS — H2511 Age-related nuclear cataract, right eye: Secondary | ICD-10-CM | POA: Diagnosis not present

## 2019-06-24 ENCOUNTER — Telehealth: Payer: Self-pay | Admitting: Cardiology

## 2019-06-24 NOTE — Telephone Encounter (Signed)
No changed based on this reading.

## 2019-06-24 NOTE — Telephone Encounter (Signed)
FYI reporting blood pressures after only taking one metoprolol. Thanks!

## 2019-06-24 NOTE — Telephone Encounter (Signed)
New Message  Pt c/o BP issue: STAT if pt c/o blurred vision, one-sided weakness or slurred speech  1. What are your last 5 BP readings? 103/50, 107/51 hr 59, 105/51 hr 59, 102/52 hr 59, 108/44 hr 60,   2. Are you having any other symptoms (ex. Dizziness, headache, blurred vision, passed out)? No symptoms, patient states that she feels better now that she is only taking 1 metoprolol   3. What is your BP issue? Reporting Bp's

## 2019-06-27 NOTE — Telephone Encounter (Signed)
Advised patient, verbalized understanding  

## 2019-07-04 ENCOUNTER — Other Ambulatory Visit: Payer: Self-pay

## 2019-07-04 MED ORDER — METOPROLOL SUCCINATE ER 25 MG PO TB24: 25.0000 mg | ORAL_TABLET | Freq: Every day | ORAL | 1 refills | Status: DC

## 2019-07-05 ENCOUNTER — Other Ambulatory Visit: Payer: Self-pay

## 2019-07-05 MED ORDER — METOPROLOL SUCCINATE ER 25 MG PO TB24: 25.0000 mg | ORAL_TABLET | Freq: Every day | ORAL | 1 refills | Status: DC

## 2019-07-19 ENCOUNTER — Ambulatory Visit (INDEPENDENT_AMBULATORY_CARE_PROVIDER_SITE_OTHER): Payer: Medicare Other | Admitting: *Deleted

## 2019-07-19 DIAGNOSIS — Z950 Presence of cardiac pacemaker: Secondary | ICD-10-CM | POA: Diagnosis not present

## 2019-07-19 LAB — CUP PACEART REMOTE DEVICE CHECK
Battery Remaining Longevity: 66 mo
Battery Remaining Percentage: 68 %
Brady Statistic RA Percent Paced: 84 %
Brady Statistic RV Percent Paced: 97 %
Date Time Interrogation Session: 20201215021100
Implantable Lead Implant Date: 20131018
Implantable Lead Implant Date: 20131018
Implantable Lead Location: 753859
Implantable Lead Location: 753860
Implantable Lead Model: 4135
Implantable Lead Model: 4136
Implantable Lead Serial Number: 29232066
Implantable Lead Serial Number: 29233089
Implantable Pulse Generator Implant Date: 20131018
Lead Channel Impedance Value: 493 Ohm
Lead Channel Impedance Value: 602 Ohm
Lead Channel Pacing Threshold Amplitude: 0.5 V
Lead Channel Pacing Threshold Amplitude: 0.7 V
Lead Channel Pacing Threshold Pulse Width: 0.4 ms
Lead Channel Pacing Threshold Pulse Width: 0.4 ms
Lead Channel Setting Pacing Amplitude: 1.2 V
Lead Channel Setting Pacing Amplitude: 2 V
Lead Channel Setting Pacing Pulse Width: 0.4 ms
Lead Channel Setting Sensing Sensitivity: 2.5 mV
Pulse Gen Serial Number: 111608

## 2019-08-15 ENCOUNTER — Telehealth: Payer: Self-pay | Admitting: Cardiology

## 2019-08-15 NOTE — Telephone Encounter (Signed)
New Message  Pt would like to speak with Dr. Rosezella [place] nurse, Rip Harbour. Would not give me any details  Please call

## 2019-08-15 NOTE — Telephone Encounter (Signed)
Returned call to pt she is SOB for 2-3 days tried to schedule appt for tomorrow she is unavailable scheduled appt for wed 1-14 @215pm  she will call back if she is unable to make appt

## 2019-08-16 ENCOUNTER — Encounter: Payer: Medicare Other | Admitting: Internal Medicine

## 2019-08-17 ENCOUNTER — Telehealth: Payer: Self-pay | Admitting: Cardiology

## 2019-08-17 NOTE — Telephone Encounter (Signed)
Patient calling requesting Nancy Bush, to come with her to her appointment tomorrow 1/14 with Coletta Memos. She states she's not sure she can make it by herself.

## 2019-08-17 NOTE — Telephone Encounter (Signed)
Pt is 89 and she sounds weak on the phone and reports that her gait is unsteady and would like her friend Vaughan Basta to help her come to her appt tomorrow. I advised her that would be fine and to be sure to wear her mask when she comes to the office.

## 2019-08-18 ENCOUNTER — Ambulatory Visit: Payer: Medicare Other | Admitting: General Practice

## 2019-08-18 NOTE — Progress Notes (Deleted)
Cardiology Clinic Note   Patient Name: Nancy Bush Date of Encounter: 08/18/2019  Primary Care Provider:  Lawerance Cruel, MD Primary Cardiologist:  Minus Breeding, MD  Patient Profile    Nancy Bush 86 year old female presents today for follow-up evaluation of her edema.  Past Medical History    Past Medical History:  Diagnosis Date  . Asthma   . Atrial fibrillation ([place])   . CHF (congestive heart failure) ([place])   . COPD (chronic obstructive pulmonary disease) ([place])   . H/O: hysterectomy   . Hypertension   . LBBB (left bundle branch block)    per discharge note 2003  . Mitral regurgitation    MILD  . MVP (mitral valve prolapse)   . Pacemaker   . Pulmonary hypertension ([place])    MILD TO MODERATE BY ECHO  . PVC (premature ventricular contraction)   . Rheumatoid arthritis(714.0)   . S/P Maze operation for atrial fibrillation 05/18/2012   Complete biatrial lesion set using cryothermy with oversewing of LA appendage  . S/P mitral valve replacement with bioprosthetic valve 05/18/2012   68mm Edwards Magna Mitral pericardial bioprosthesis    Past Surgical History:  Procedure Laterality Date  . ABDOMINAL HYSTERECTOMY    . APPENDECTOMY    . CATARACT EXTRACTION    . CHOLECYSTECTOMY    . hysterectomy    . MAZE  05/18/2012   Procedure: MAZE;  Surgeon: Rexene Alberts, MD;  Location: [place];  Service: Open Heart Surgery;  Laterality: Right;  . MITRAL VALVE REPLACEMENT  05/18/2012   Procedure: MINIMALLY INVASIVE MITRAL VALVE (MV) REPLACEMENT;  Surgeon: Rexene Alberts, MD;  Location: [place];  Service: Open Heart Surgery;  Laterality: N/A;  . OVARIAN CYST REMOVAL    . PACEMAKER INSERTION    . PERMANENT PACEMAKER INSERTION N/A 05/21/2012   Procedure: PERMANENT PACEMAKER INSERTION;  Surgeon: Evans Lance, MD;  Location: [facility] CATH LAB;  Service: Cardiovascular;  Laterality: N/A;  . TEE WITHOUT CARDIOVERSION  02/09/2012   Procedure: TRANSESOPHAGEAL ECHOCARDIOGRAM  (TEE);  Surgeon: Fay Records, MD;  Location: [facility] ENDOSCOPY;  Service: Cardiovascular;  Laterality: N/A;    Allergies  Allergies  Allergen Reactions  . Promethazine Hcl Other (See Comments)    unknown  . Septra [Sulfamethoxazole-Trimethoprim]   . Sulfonamide Derivatives Other (See Comments)    unknown  . Tequin Other (See Comments)    hallucinations     History of Present Illness    Ms.Gordy has a past medical history of mitral valve disease, status post mitral valve replacement with bioprosthetic valve and Maze procedure October 2013.  She also has symptomatic bradycardia status post PPM plantation and paroxysmal atrial fibrillation.  She was last seen by Dr. Percival Spanish virtually on 03/18/2019.  During that time she was doing well.  She did have some lower extremity edema left arm and also lower extremities.  Her diuretics are dosed by her PCP.  She denied PND, orthopnea, palpitations presyncope, and syncope.  She was not having any new chest pressure, arm or neck discomfort.  She also indicated that she was self isolating due to the COVID-19 virus.  Her niece was helping her monitor her blood pressures at that time.  She stated she had fatigue and also noted dyspnea with exertion.  However her oxygen saturation after walking in the exam room on room air was 98%.  Her metoprolol was reduced to 25 mg daily.  She presents to the clinic today and states***  *** denies  chest pain, shortness of breath, lower extremity edema, fatigue, palpitations, melena, hematuria, hemoptysis, diaphoresis, weakness, presyncope, syncope, orthopnea, and PND.   Home Medications    Prior to Admission medications   Medication Sig Start Date End Date Taking? Authorizing Provider  Calcium Carbonate-Vitamin D (CALTRATE 600+D) 600-400 MG-UNIT per tablet Take 1 tablet by mouth daily.     [provider]  ELIQUIS 2.5 MG TABS tablet TAKE 1 TABLET BY MOUTH TWICE A DAY 03/01/19   Minus Breeding, MD  ENSURE  PLUS (ENSURE PLUS) LIQD Take 1 Can by mouth daily.    [provider]  feeding supplement (BOOST HIGH PROTEIN) LIQD Take 1 Container by mouth daily.    [provider]  furosemide (LASIX) 20 MG tablet Take 20 mg by mouth 3 (three) times daily. Pt takes 3 to 4 tabs daily/ WG/ TAKE MON, WED AND Friday, OK TO TAKE PRN SWELLING    [provider]  metoprolol succinate (TOPROL-XL) 25 MG 24 hr tablet Take 1 tablet (25 mg total) by mouth daily. 07/05/19   Minus Breeding, MD  montelukast (SINGULAIR) 10 MG tablet Take 10 mg by mouth at bedtime.  12/31/12   [provider]  Multiple Vitamins-Minerals (PRESERVISION AREDS PO) Take 1 tablet by mouth 2 (two) times daily. Preservision Areds     [provider]  nitroGLYCERIN (NITROSTAT) 0.4 MG SL tablet Place 0.4 mg under the tongue every 5 (five) minutes as needed for chest pain (x 3 tabs daily).    [provider]  predniSONE (DELTASONE) 5 MG tablet Take 2.5 mg by mouth every other day.     [provider]    Family History    No family history on file. She indicated that her mother is deceased. She indicated that her father is deceased. She indicated that her maternal grandmother is deceased. She indicated that her maternal grandfather is deceased. She indicated that her paternal grandmother is deceased. She indicated that her paternal grandfather is deceased.  Social History    Social History   Socioeconomic History  . Marital status: Single    Spouse name: Not on file  . Number of children: Not on file  . Years of education: Not on file  . Highest education level: Not on file  Occupational History  . Not on file  Tobacco Use  . Smoking status: Never Smoker  . Smokeless tobacco: Never Used  Substance and Sexual Activity  . Alcohol use: No  . Drug use: No  . Sexual activity: Not Currently    Birth control/protection: Post-menopausal  Other Topics Concern  . Not on file  Social  History Narrative  . Not on file   Social Determinants of Health   Financial Resource Strain:   . Difficulty of Paying Living Expenses: Not on file  Food Insecurity:   . Worried About Charity fundraiser in the Last Year: Not on file  . Ran Out of Food in the Last Year: Not on file  Transportation Needs:   . Lack of Transportation (Medical): Not on file  . Lack of Transportation (Non-Medical): Not on file  Physical Activity:   . Days of Exercise per Week: Not on file  . Minutes of Exercise per Session: Not on file  Stress:   . Feeling of Stress : Not on file  Social Connections:   . Frequency of Communication with Friends and Family: Not on file  . Frequency of Social Gatherings with Friends and Family: Not on  file  . Attends Religious Services: Not on file  . Active Member of Clubs or Organizations: Not on file  . Attends Archivist Meetings: Not on file  . Marital Status: Not on file  Intimate Partner Violence:   . Fear of Current or Ex-Partner: Not on file  . Emotionally Abused: Not on file  . Physically Abused: Not on file  . Sexually Abused: Not on file     Review of Systems    General:  No chills, fever, night sweats or weight changes.  Cardiovascular:  No chest pain, dyspnea on exertion, edema, orthopnea, palpitations, paroxysmal nocturnal dyspnea. Dermatological: No rash, lesions/masses Respiratory: No cough, dyspnea Urologic: No hematuria, dysuria Abdominal:   No nausea, vomiting, diarrhea, bright red blood per rectum, melena, or hematemesis Neurologic:  No visual changes, wkns, changes in mental status. All other systems reviewed and are otherwise negative except as noted above.  Physical Exam    VS:  There were no vitals taken for this visit. , BMI There is no height or weight on file to calculate BMI. GEN: Well nourished, well developed, in no acute distress. HEENT: normal. Neck: Supple, no JVD, carotid bruits, or masses. Cardiac: RRR, no  murmurs, rubs, or gallops. No clubbing, cyanosis, edema.  Radials/DP/PT 2+ and equal bilaterally.  Respiratory:  Respirations regular and unlabored, clear to auscultation bilaterally. GI: Soft, nontender, nondistended, BS + x 4. MS: no deformity or atrophy. Skin: warm and dry, no rash. Neuro:  Strength and sensation are intact. Psych: Normal affect.  Accessory Clinical Findings    ECG personally reviewed by me today- *** - No acute changes  EKG  Echocardiogram 05/20/2018 Impressions:  - Normal LV size with mild LV hypertrophy. EF 60-65% . D-shaped   interventricular septum suggesting RV pressure/volume overload.   Moderately dilated RV with mildly decreased systolic function.   Bioprosthetic mitral valve appeared to function normally. Severe   tricuspid regurgitation. Severe pulmonary hypertension.  Assessment & Plan   1.  Paroxysmal atrial fibrillation-EKG today shows*** Continue apixaban 2.5 mg twice daily Avoid triggers caffeine, chocolate, EtOH, etc. Heart healthy diet Increase physical activity as tolerated   Hypotension-BP today*** Continue metoprolol 25 mg daily Continue heart healthy diet Order BMP  Mitral valve replacement-echocardiogram 05/2018 bioprosthetic mitral valve functioning normally.  No increased dyspnea today.  Tricuspid regurgitation-indicated to be severe on 05/2018 echocardiogram.  Due to her age is decided this will be managed clinically. Elevate lower extremities when not active Lower extremity support stockings Continue furosemide 20 mg tablet 3 times daily  Disposition: Follow-up with Dr. Percival Spanish in 3 months.  Jossie Ng. [facility] [place] [address] Office [phone] Fax [phone]

## 2019-08-19 ENCOUNTER — Ambulatory Visit: Payer: Medicare Other | Admitting: General Practice

## 2019-08-20 NOTE — Progress Notes (Signed)
PPM Remote  

## 2019-08-23 NOTE — Progress Notes (Deleted)
Cardiology Clinic Note   Patient Name: Nancy Bush Date of Encounter: 08/23/2019  Primary Care Provider:  Lawerance Cruel, MD Primary Cardiologist:  Minus Breeding, MD  Patient Profile    Nancy Bush 86 year old female presents today for an evaluation of her shortness of breath.  Past Medical History    Past Medical History:  Diagnosis Date  . Asthma   . Atrial fibrillation ([place])   . CHF (congestive heart failure) ([place])   . COPD (chronic obstructive pulmonary disease) ([place])   . H/O: hysterectomy   . Hypertension   . LBBB (left bundle branch block)    per discharge note 2003  . Mitral regurgitation    MILD  . MVP (mitral valve prolapse)   . Pacemaker   . Pulmonary hypertension ([place])    MILD TO MODERATE BY ECHO  . PVC (premature ventricular contraction)   . Rheumatoid arthritis(714.0)   . S/P Maze operation for atrial fibrillation 05/18/2012   Complete biatrial lesion set using cryothermy with oversewing of LA appendage  . S/P mitral valve replacement with bioprosthetic valve 05/18/2012   68mm Edwards Magna Mitral pericardial bioprosthesis    Past Surgical History:  Procedure Laterality Date  . ABDOMINAL HYSTERECTOMY    . APPENDECTOMY    . CATARACT EXTRACTION    . CHOLECYSTECTOMY    . hysterectomy    . MAZE  05/18/2012   Procedure: MAZE;  Surgeon: Rexene Alberts, MD;  Location: [place];  Service: Open Heart Surgery;  Laterality: Right;  . MITRAL VALVE REPLACEMENT  05/18/2012   Procedure: MINIMALLY INVASIVE MITRAL VALVE (MV) REPLACEMENT;  Surgeon: Rexene Alberts, MD;  Location: [place];  Service: Open Heart Surgery;  Laterality: N/A;  . OVARIAN CYST REMOVAL    . PACEMAKER INSERTION    . PERMANENT PACEMAKER INSERTION N/A 05/21/2012   Procedure: PERMANENT PACEMAKER INSERTION;  Surgeon: Evans Lance, MD;  Location: [facility] CATH LAB;  Service: Cardiovascular;  Laterality: N/A;  . TEE WITHOUT CARDIOVERSION  02/09/2012   Procedure: TRANSESOPHAGEAL  ECHOCARDIOGRAM (TEE);  Surgeon: Fay Records, MD;  Location: [facility] ENDOSCOPY;  Service: Cardiovascular;  Laterality: N/A;    Allergies  Allergies  Allergen Reactions  . Promethazine Hcl Other (See Comments)    unknown  . Septra [Sulfamethoxazole-Trimethoprim]   . Sulfonamide Derivatives Other (See Comments)    unknown  . Tequin Other (See Comments)    hallucinations     History of Present Illness    Nancy Bush is a past medical history of severe mitral regurgitation, atrial fibrillation, pulmonary hypertension, mitral valve disorder, acute on chronic diastolic heart failure, Atrial flutter, CKD, shortness of breath, and hypertension.  She is status post mitral valve replacement with bioprosthetic valve and Maze procedure (05/2012), symptomatic bradycardia s/p PPM implantation (05/2012).  She was last seen by Dr. Percival Spanish virtually 03/18/2019.  During that time she was doing very well.  She had some left arm swelling as well as lower extremity swelling.  Her diuretics are dosed by her PCP.  She denied PND and orthopnea.  She had no complaints of palpitations, presyncope or syncope at that time and she also denied chest pressure, neck and arm pain.  She indicated that she had been isolating due to the COVID-19 pandemic and that her blood pressure has been running low.  She indicated that she continued to have dyspnea with exertion however, when her oxygen saturation was last tested after walking to the exam room she remained at 98%, and  denied chest pain.  Her metoprolol was reduced to 25 mg daily.  She presents the clinic today and states***  *** denies chest pain, shortness of breath, lower extremity edema, fatigue, palpitations, melena, hematuria, hemoptysis, diaphoresis, weakness, presyncope, syncope, orthopnea, and PND.  Home Medications    Prior to Admission medications   Medication Sig Start Date End Date Taking? Authorizing Provider  Calcium Carbonate-Vitamin D (CALTRATE 600+D)  600-400 MG-UNIT per tablet Take 1 tablet by mouth daily.     [provider]  ELIQUIS 2.5 MG TABS tablet TAKE 1 TABLET BY MOUTH TWICE A DAY 03/01/19   Minus Breeding, MD  ENSURE PLUS (ENSURE PLUS) LIQD Take 1 Can by mouth daily.    [provider]  feeding supplement (BOOST HIGH PROTEIN) LIQD Take 1 Container by mouth daily.    [provider]  furosemide (LASIX) 20 MG tablet Take 20 mg by mouth 3 (three) times daily. Pt takes 3 to 4 tabs daily/ WG/ TAKE MON, WED AND Friday, OK TO TAKE PRN SWELLING    [provider]  metoprolol succinate (TOPROL-XL) 25 MG 24 hr tablet Take 1 tablet (25 mg total) by mouth daily. 07/05/19   Minus Breeding, MD  montelukast (SINGULAIR) 10 MG tablet Take 10 mg by mouth at bedtime.  12/31/12   [provider]  Multiple Vitamins-Minerals (PRESERVISION AREDS PO) Take 1 tablet by mouth 2 (two) times daily. Preservision Areds     [provider]  nitroGLYCERIN (NITROSTAT) 0.4 MG SL tablet Place 0.4 mg under the tongue every 5 (five) minutes as needed for chest pain (x 3 tabs daily).    [provider]  predniSONE (DELTASONE) 5 MG tablet Take 2.5 mg by mouth every other day.     [provider]    Family History    No family history on file. She indicated that her mother is deceased. She indicated that her father is deceased. She indicated that her maternal grandmother is deceased. She indicated that her maternal grandfather is deceased. She indicated that her paternal grandmother is deceased. She indicated that her paternal grandfather is deceased.  Social History    Social History   Socioeconomic History  . Marital status: Single    Spouse name: Not on file  . Number of children: Not on file  . Years of education: Not on file  . Highest education level: Not on file  Occupational History  . Not on file  Tobacco Use  . Smoking status: Never Smoker  . Smokeless tobacco: Never Used  Substance  and Sexual Activity  . Alcohol use: No  . Drug use: No  . Sexual activity: Not Currently    Birth control/protection: Post-menopausal  Other Topics Concern  . Not on file  Social History Narrative  . Not on file   Social Determinants of Health   Financial Resource Strain:   . Difficulty of Paying Living Expenses: Not on file  Food Insecurity:   . Worried About Charity fundraiser in the Last Year: Not on file  . Ran Out of Food in the Last Year: Not on file  Transportation Needs:   . Lack of Transportation (Medical): Not on file  . Lack of Transportation (Non-Medical): Not on file  Physical Activity:   . Days of Exercise per Week: Not on file  . Minutes of Exercise per Session: Not on file  Stress:   . Feeling of Stress : Not on file  Social Connections:   .  Frequency of Communication with Friends and Family: Not on file  . Frequency of Social Gatherings with Friends and Family: Not on file  . Attends Religious Services: Not on file  . Active Member of Clubs or Organizations: Not on file  . Attends Archivist Meetings: Not on file  . Marital Status: Not on file  Intimate Partner Violence:   . Fear of Current or Ex-Partner: Not on file  . Emotionally Abused: Not on file  . Physically Abused: Not on file  . Sexually Abused: Not on file     Review of Systems    General:  No chills, fever, night sweats or weight changes.  Cardiovascular:  No chest pain, dyspnea on exertion, edema, orthopnea, palpitations, paroxysmal nocturnal dyspnea. Dermatological: No rash, lesions/masses Respiratory: No cough, dyspnea Urologic: No hematuria, dysuria Abdominal:   No nausea, vomiting, diarrhea, bright red blood per rectum, melena, or hematemesis Neurologic:  No visual changes, wkns, changes in mental status. All other systems reviewed and are otherwise negative except as noted above.  Physical Exam    VS:  There were no vitals taken for this visit. , BMI There is no height  or weight on file to calculate BMI. GEN: Well nourished, well developed, in no acute distress. HEENT: normal. Neck: Supple, no JVD, carotid bruits, or masses. Cardiac: RRR, no murmurs, rubs, or gallops. No clubbing, cyanosis, edema.  Radials/DP/PT 2+ and equal bilaterally.  Respiratory:  Respirations regular and unlabored, clear to auscultation bilaterally. GI: Soft, nontender, nondistended, BS + x 4. MS: no deformity or atrophy. Skin: warm and dry, no rash. Neuro:  Strength and sensation are intact. Psych: Normal affect.  Accessory Clinical Findings    ECG personally reviewed by me today- *** - No acute changes  EKG 10/12/2018 AV dual paced with occasional PVCs 66 bpm  Echocardiogram 05/20/2018 Impressions:  - Normal LV size with mild LV hypertrophy. EF 60-65% . D-shaped   interventricular septum suggesting RV pressure/volume overload.   Moderately dilated RV with mildly decreased systolic function.   Bioprosthetic mitral valve appeared to function normally. Severe   tricuspid regurgitation. Severe pulmonary hypertension.  Assessment & Plan   1.  Dyspnea on exertion-has been chronic with her MVR and TR.  Oxygen saturation on room air after walking to exam room is***  Paroxysmal atrial fibrillation-EKG today shows*** Continue Eliquis 2.5 mg twice daily Continue metoprolol 25 mg daily Increase physical activity as tolerated Heart healthy diet Avoid triggers chocolate, caffeine, EtOH, etc.  Hypotension-BP today*** Continue metoprolol 25 mg daily Heart healthy diet Increase physical activity as tolerated Maintain adequate p.o. hydration  Mitral valve regurgitation-last echocardiogram 05/2018 showed stable MVR.  Plan is to follow this clinically  Tricuspid valve regurgitation-showed to be severe on previous echocardiogram.  Euvolemic today.  Again, plan to follow this clinically due to her advanced age. Elevate extremities when not active Lower extremity support  stockings Heart healthy low-sodium diet  Disposition: Follow-up with Dr. Percival Spanish in 6 months.  Jossie Ng. [facility] [place] [address] Office [phone] Fax [phone]

## 2019-08-24 ENCOUNTER — Ambulatory Visit: Payer: Medicare Other | Admitting: General Practice

## 2019-08-26 ENCOUNTER — Other Ambulatory Visit: Payer: Self-pay

## 2019-08-26 ENCOUNTER — Encounter (HOSPITAL_COMMUNITY): Payer: Self-pay | Admitting: Obstetrics and Gynecology

## 2019-08-26 ENCOUNTER — Emergency Department (HOSPITAL_COMMUNITY): Payer: Medicare Other

## 2019-08-26 ENCOUNTER — Emergency Department (HOSPITAL_COMMUNITY)
Admission: EM | Admit: 2019-08-26 | Discharge: 2019-08-29 | Disposition: A | Discharge status: Home / Self Care | Payer: Medicare Other | Attending: Emergency Medicine | Admitting: Emergency Medicine

## 2019-08-26 DIAGNOSIS — I130 Hypertensive heart and chronic kidney disease with heart failure and stage 1 through stage 4 chronic kidney disease, or unspecified chronic kidney disease: Secondary | ICD-10-CM | POA: Diagnosis not present

## 2019-08-26 DIAGNOSIS — Z79899 Other long term (current) drug therapy: Secondary | ICD-10-CM | POA: Insufficient documentation

## 2019-08-26 DIAGNOSIS — N182 Chronic kidney disease, stage 2 (mild): Secondary | ICD-10-CM | POA: Insufficient documentation

## 2019-08-26 DIAGNOSIS — J45909 Unspecified asthma, uncomplicated: Secondary | ICD-10-CM | POA: Insufficient documentation

## 2019-08-26 DIAGNOSIS — Z20822 Contact with and (suspected) exposure to covid-19: Secondary | ICD-10-CM | POA: Insufficient documentation

## 2019-08-26 DIAGNOSIS — J449 Chronic obstructive pulmonary disease, unspecified: Secondary | ICD-10-CM | POA: Diagnosis not present

## 2019-08-26 DIAGNOSIS — Z7901 Long term (current) use of anticoagulants: Secondary | ICD-10-CM | POA: Insufficient documentation

## 2019-08-26 DIAGNOSIS — R5381 Other malaise: Secondary | ICD-10-CM | POA: Insufficient documentation

## 2019-08-26 DIAGNOSIS — R0602 Shortness of breath: Secondary | ICD-10-CM | POA: Diagnosis not present

## 2019-08-26 DIAGNOSIS — R627 Adult failure to thrive: Secondary | ICD-10-CM | POA: Diagnosis not present

## 2019-08-26 DIAGNOSIS — I5032 Chronic diastolic (congestive) heart failure: Secondary | ICD-10-CM | POA: Diagnosis not present

## 2019-08-26 DIAGNOSIS — Z950 Presence of cardiac pacemaker: Secondary | ICD-10-CM | POA: Diagnosis not present

## 2019-08-26 DIAGNOSIS — R0902 Hypoxemia: Secondary | ICD-10-CM | POA: Diagnosis not present

## 2019-08-26 LAB — CBC WITH DIFFERENTIAL/PLATELET
Abs Immature Granulocytes: 0.01 10*3/uL (ref 0.00–0.07)
Basophils Absolute: 0 10*3/uL (ref 0.0–0.1)
Basophils Relative: 1 %
Eosinophils Absolute: 0.3 10*3/uL (ref 0.0–0.5)
Eosinophils Relative: 9 %
HCT: 34.7 % — ABNORMAL LOW (ref 36.0–46.0)
Hemoglobin: 10.9 g/dL — ABNORMAL LOW (ref 12.0–15.0)
Immature Granulocytes: 0 %
Lymphocytes Relative: 16 %
Lymphs Abs: 0.6 10*3/uL — ABNORMAL LOW (ref 0.7–4.0)
MCH: 28.9 pg (ref 26.0–34.0)
MCHC: 31.4 g/dL (ref 30.0–36.0)
MCV: 92 fL (ref 80.0–100.0)
Monocytes Absolute: 1.2 10*3/uL — ABNORMAL HIGH (ref 0.1–1.0)
Monocytes Relative: 32 %
Neutro Abs: 1.6 10*3/uL — ABNORMAL LOW (ref 1.7–7.7)
Neutrophils Relative %: 42 %
Platelets: 133 10*3/uL — ABNORMAL LOW (ref 150–400)
RBC: 3.77 MIL/uL — ABNORMAL LOW (ref 3.87–5.11)
RDW: 13.2 % (ref 11.5–15.5)
WBC: 3.8 10*3/uL — ABNORMAL LOW (ref 4.0–10.5)
nRBC: 0 % (ref 0.0–0.2)

## 2019-08-26 LAB — COMPREHENSIVE METABOLIC PANEL
ALT: 11 U/L (ref 0–44)
AST: 19 U/L (ref 15–41)
Albumin: 3.4 g/dL — ABNORMAL LOW (ref 3.5–5.0)
Alkaline Phosphatase: 74 U/L (ref 38–126)
Anion gap: 8 (ref 5–15)
BUN: 13 mg/dL (ref 8–23)
CO2: 28 mmol/L (ref 22–32)
Calcium: 8.9 mg/dL (ref 8.9–10.3)
Chloride: 99 mmol/L (ref 98–111)
Creatinine, Ser: 0.85 mg/dL (ref 0.44–1.00)
GFR calc Af Amer: >60 mL/min (ref >60)
GFR calc non Af Amer: 60 mL/min — ABNORMAL LOW (ref >60)
Glucose, Bld: 104 mg/dL — ABNORMAL HIGH (ref 70–99)
Potassium: 4.5 mmol/L (ref 3.5–5.1)
Sodium: 135 mmol/L (ref 135–145)
Total Bilirubin: 0.9 mg/dL (ref 0.3–1.2)
Total Protein: 6.7 g/dL (ref 6.5–8.1)

## 2019-08-26 LAB — URINALYSIS, ROUTINE W REFLEX MICROSCOPIC
Bilirubin Urine: NEGATIVE
Glucose, UA: NEGATIVE mg/dL
Hgb urine dipstick: NEGATIVE
Ketones, ur: 20 mg/dL — AB
Leukocytes,Ua: NEGATIVE
Nitrite: NEGATIVE
Protein, ur: 30 mg/dL — AB
Specific Gravity, Urine: 1.015 (ref 1.005–1.030)
pH: 6 (ref 5.0–8.0)

## 2019-08-26 LAB — TROPONIN I (HIGH SENSITIVITY)
Troponin I (High Sensitivity): 7 ng/L (ref <18)
Troponin I (High Sensitivity): 7 ng/L (ref <18)

## 2019-08-26 LAB — BRAIN NATRIURETIC PEPTIDE: B Natriuretic Peptide: 571.3 pg/mL — ABNORMAL HIGH (ref 0.0–100.0)

## 2019-08-26 MED ORDER — APIXABAN 2.5 MG PO TABS
2.5000 mg | ORAL_TABLET | Freq: Two times a day (BID) | ORAL | Status: DC
  Administered 2019-08-26 – 2019-08-29 (×6): 2.5 mg via ORAL
  Filled 2019-08-26 (×8): qty 1

## 2019-08-26 NOTE — ED Provider Notes (Signed)
[facility] DEPT Provider Note  CSN: [id number] Arrival date & time: 08/26/19  1711   History Chief Complaint  Patient presents with   Failure To Thrive    Nancy Bush is a 86 y.o. female with HTN, A.fib on Eliquis, CHF, asthma/COPD who presents with SOB and weakness. She states she has been feeling SOB for the past 2-3 days. She gets SOB with exertion and very fatigued. She reports she has been taking her medicines but missed her dose tonight. I discussed with her niece Hoyle Sauer who is her medical POA. She states that  since Christmas she has been more SOB and weak. She is concerned the patient may be going in to A.fib.  She is also concerned that the patient doesn't eat well and she will take food over sometimes but she won't let anyone come in her house. She has been complaining about being cold and isn't able to do much for herself. Someone has to get her mail, get her groceries and take her to appointments although she's been cancelling her appointments because she feels too weak to go. Hoyle Sauer thinks she just stays in bed under the electric blanket for the most part. She called EMS tonight due to SOB and they noted that the house was cold and there was moldy food lying around. The patient denies fever, syncope, chest pain, leg swelling.    HPI   Past Medical History:  Diagnosis Date   Asthma    Atrial fibrillation ([place])    CHF (congestive heart failure) ([place])    COPD (chronic obstructive pulmonary disease) ([place])    H/O: hysterectomy    Hypertension    LBBB (left bundle branch block)    per discharge note 2003   Mitral regurgitation    MILD   MVP (mitral valve prolapse)    Pacemaker    Pulmonary hypertension ([place])    MILD TO MODERATE BY ECHO   PVC (premature ventricular contraction)    Rheumatoid arthritis(714.0)    S/P Maze operation for atrial fibrillation 05/18/2012   Complete biatrial lesion set using cryothermy with  oversewing of LA appendage   S/P mitral valve replacement with bioprosthetic valve 05/18/2012   44mm Edwards Magna Mitral pericardial bioprosthesis     Patient Active Problem List   Diagnosis Date Noted   Educated about COVID-19 virus infection 03/20/2019   Hypotension 03/18/2019   S/P MVR (mitral valve repair) 03/18/2019   Pancytopenia ([place]) 05/21/2018   TGA (transient global amnesia) 05/19/2018   Essential hypertension 12/11/2017   Medication management 12/11/2017   Pulmonary HTN ([place]) 12/30/2016   Protein calorie malnutrition ([place]) 08/22/2012   CAP (community acquired pneumonia) 08/21/2012   Hypokalemia 08/21/2012   SOB (shortness of breath) 08/20/2012   FTT (failure to thrive) in adult 08/20/2012   Leucocytosis 08/20/2012   Atrial flutter ([place]) 06/24/2012   CKD (chronic kidney disease), stage III 06/24/2012   Pleural effusion 06/24/2012   Pacemaker-[employer] 05/25/2012   Bradycardia 05/21/2012   S/P mitral valve replacement with bioprosthetic valve 05/18/2012   S/P Maze operation for atrial fibrillation 05/18/2012   Severe mitral regurgitation 05/17/2012   A-fib ([place]) 05/17/2012   MR (mitral regurgitation) 05/03/2012   Fatigue 01/29/2012   Acute on chronic diastolic heart failure ([place]) 06/10/2011   CKD (chronic kidney disease) 06/03/2011   Encounter for long-term (current) use of anticoagulants 05/26/2011   Atrial fibrillation ([place]) 05/19/2011   CHEST PAIN UNSPECIFIED 08/28/2010   ABNORMAL ELECTROCARDIOGRAM 11/14/2009   Mitral  Regurgitation 11/12/2009   PULMONARY HYPERTENSION 11/12/2009   Mitral valve disorder 11/12/2009   SINUSITIS, CHRONIC 06/12/2007   ALLERGIC RHINITIS 06/12/2007   BRONCHITIS 06/12/2007   COPD 06/12/2007    Past Surgical History:  Procedure Laterality Date   ABDOMINAL HYSTERECTOMY     APPENDECTOMY     CATARACT EXTRACTION     CHOLECYSTECTOMY     hysterectomy     MAZE  05/18/2012    Procedure: MAZE;  Surgeon: Rexene Alberts, MD;  Location: [facility];  Service: Open Heart Surgery;  Laterality: Right;   MITRAL VALVE REPLACEMENT  05/18/2012   Procedure: MINIMALLY INVASIVE MITRAL VALVE (MV) REPLACEMENT;  Surgeon: Rexene Alberts, MD;  Location: [facility];  Service: Open Heart Surgery;  Laterality: N/A;   OVARIAN CYST REMOVAL     PACEMAKER INSERTION     PERMANENT PACEMAKER INSERTION N/A 05/21/2012   Procedure: PERMANENT PACEMAKER INSERTION;  Surgeon: Evans Lance, MD;  Location[facility] C[facility] LAB;  Service: Cardiovascular;  Laterality: N/A;   TEE WITHOUT CARDIOVERSION  02/09/2012   Procedure: TRANSESOPHAGEAL ECHOCARDIOGRAM (TEE);  Surgeon: Fay Records, MD;  Location: [facility] ENDOSCOPY;  Service: Cardiovascular;  Laterality: N/A;     OB History   No obstetric history on file.     No family history on file.  Social History   Tobacco Use   Smoking status: Never Smoker   Smokeless tobacco: Never Used  Substance Use Topics   Alcohol use: No   Drug use: No    Home Medications Prior to Admission medications   Medication Sig Start Date End Date Taking? Authorizing Provider  Calcium Carbonate-Vitamin D (CALTRATE 600+D) 600-400 MG-UNIT per tablet Take 1 tablet by mouth daily.    Yes [provider]  ELIQUIS 2.5 MG TABS tablet TAKE 1 TABLET BY MOUTH TWICE A DAY Patient taking differently: Take 2.5 mg by mouth 2 (two) times daily.  03/01/19  Yes Minus Breeding, MD  ENSURE PLUS (ENSURE PLUS) LIQD Take 1 Can by mouth daily.   Yes [provider]  feeding supplement (BOOST HIGH PROTEIN) LIQD Take 1 Container by mouth daily.   Yes [provider]  furosemide (LASIX) 20 MG tablet Take 60-80 mg by mouth See admin instructions. TAKE MON, WED AND Friday, OK TO TAKE PRN SWELLING   Yes [provider]  metoprolol succinate (TOPROL-XL) 25 MG 24 hr tablet Take 1 tablet (25 mg total) by mouth daily. 07/05/19  Yes Minus Breeding, MD  montelukast (SINGULAIR) 10  MG tablet Take 10 mg by mouth at bedtime.  12/31/12  Yes [provider]  Multiple Vitamins-Minerals (PRESERVISION AREDS PO) Take 1 tablet by mouth 2 (two) times daily. Preservision Areds    Yes [provider]  nitroGLYCERIN (NITROSTAT) 0.4 MG SL tablet Place 0.4 mg under the tongue every 5 (five) minutes as needed for chest pain (x 3 tabs daily).   Yes [provider]  predniSONE (DELTASONE) 5 MG tablet Take 2.5 mg by mouth every other day.    Yes [provider]    Allergies    Promethazine hcl, Septra [sulfamethoxazole-trimethoprim], Sulfonamide derivatives, and Tequin  Review of Systems   Review of Systems  Constitutional: Positive for fatigue. Negative for fever.  Respiratory: Positive for shortness of breath. Negative for cough and wheezing.   Cardiovascular: Negative for chest pain and leg swelling.  Gastrointestinal: Negative for abdominal pain.  Neurological: Positive for weakness.  All other systems reviewed and are negative.   Physical Exam Updated  Vital Signs BP (!) 137/59 (BP Location: Left Arm)    Pulse (!) 59    Temp 97.6 F (36.4 C) (Oral)    Resp 16    SpO2 96%   Physical Exam Vitals and nursing note reviewed.  Constitutional:      General: She is not in acute distress.    Appearance: Normal appearance. She is well-developed. She is not ill-appearing.     Comments: Frail elderly female in NAD. Hard of hearing  HENT:     Head: Normocephalic and atraumatic.  Eyes:     General: No scleral icterus.       Right eye: No discharge.        Left eye: No discharge.     Conjunctiva/sclera: Conjunctivae normal.     Pupils: Pupils are equal, round, and reactive to light.  Cardiovascular:     Rate and Rhythm: Normal rate and regular rhythm.  Pulmonary:     Effort: Pulmonary effort is normal. No respiratory distress.     Breath sounds: Normal breath sounds.  Abdominal:     General: There is no distension.     Palpations: Abdomen is  soft.     Tenderness: There is no abdominal tenderness.  Musculoskeletal:     Cervical back: Normal range of motion.     Right lower leg: No edema.     Left lower leg: No edema.  Skin:    General: Skin is warm and dry.  Neurological:     Mental Status: She is alert and oriented to person, place, and time.  Psychiatric:        Behavior: Behavior normal.     ED Results / Procedures / Treatments   Labs (all labs ordered are listed, but only abnormal results are displayed) Labs Reviewed  COMPREHENSIVE METABOLIC PANEL - Abnormal; Notable for the following components:      Result Value   Glucose, Bld 104 (*)    Albumin 3.4 (*)    GFR calc non Af Amer 60 (*)    All other components within normal limits  CBC WITH DIFFERENTIAL/PLATELET - Abnormal; Notable for the following components:   WBC 3.8 (*)    RBC 3.77 (*)    Hemoglobin 10.9 (*)    HCT 34.7 (*)    Platelets 133 (*)    Neutro Abs 1.6 (*)    Lymphs Abs 0.6 (*)    Monocytes Absolute 1.2 (*)    All other components within normal limits  BRAIN NATRIURETIC PEPTIDE - Abnormal; Notable for the following components:   B Natriuretic Peptide 571.3 (*)    All other components within normal limits  URINALYSIS, ROUTINE W REFLEX MICROSCOPIC  TROPONIN I (HIGH SENSITIVITY)  TROPONIN I (HIGH SENSITIVITY)    EKG None  Radiology DG Chest 2 View  Result Date: 08/26/2019 CLINICAL DATA:  Shortness of breath. EXAM: CHEST - 2 VIEW COMPARISON:  Radiograph 07/07/2018. CT 08/25/2010 FINDINGS: Dual lead left-sided pacemaker in place. Mild cardiomegaly, similar to prior exam. Prosthetic valve, mitral valve per history. Unchanged mediastinal contours. Mild hyperinflation, blunting of costophrenic angles may be trace effusions. Only minimal pleural fluid if any on the lateral view. No pulmonary edema, focal airspace disease, or pneumothorax. No acute osseous abnormalities are seen. IMPRESSION: 1. Mild hyperinflation and blunting of costophrenic  angles may be trace effusions. This appears similar to prior exam. 2. Stable mild cardiomegaly.  No acute pulmonary process. Electronically Signed   By: Keith Rake M.D.   On: 08/26/2019  19:10    Procedures Procedures (including critical care time)  Medications Ordered in ED Medications  apixaban (ELIQUIS) tablet 2.5 mg (has no administration in time range)    ED Course  I have reviewed the triage vital signs and the nursing notes.  Pertinent labs & imaging results that were available during my care of the patient were reviewed by me and considered in my medical decision making (see chart for details).  86 year old female with SOB and weakness. Likely acute on chronic and worsened by her living condition. Vitals are normal. She is frail and generally weak appearing but otherwise exam is unremarkable. Lungs are CTA. Will obtain EKG, labs, CXR, UA  CBC is remarkable for pancytopenia which is a chronic problem. CMP is reassuring. BMP is 571 which is similar to prior. EKG shows paced rhythm. CXR shows trace effusions which looks similar to prior. Shared visit with Dr. Francia Greaves. After discussion with pt she is agreeable to going somewhere else like an assisted living facility which we are not able to arrange in the ED. We are able to have home health set up and I think she would benefit from a social worker who could potentially help her with this process. She is hesitant to have someone come to her home but will set this up nonetheless.   At shift change a UA is still pending. Anticipate d/c home. Care signed out to A Harris PA-C who will dispo.  MDM Rules/Calculators/A&P                       Final Clinical Impression(s) / ED Diagnoses Final diagnoses:  Failure to thrive in adult  Shortness of breath    Rx / DC Orders ED Discharge Orders    None       Iris Pert 08/26/19 2112    Valarie Merino, MD 08/26/19 [phone]

## 2019-08-26 NOTE — ED Notes (Signed)
Pt ambulated ~ 10 ft in her room. Also, pt stood at the sink to wash her hands for ~3 - 5 min. Pt did well with standing and ambulation. Pts gait was steady and no issues were observed. Pts o2sat stayed in the mid 90s for all of the activity. RN aware.

## 2019-08-26 NOTE — Discharge Instructions (Addendum)
Get help right away if you: Have chest pain. Are very short of breath. Have any episodes of fainting.

## 2019-08-26 NOTE — ED Triage Notes (Signed)
Per patient's niece: Patient has missed multiple cardiologist appointments due to fatigue. Patient is not living in a safe home environment as witnessed by EMS. Temperature reported in home to be very cold, moldy food reported noted in home. Patient found sitting in front of space heater stating she had previously had SOB.

## 2019-08-26 NOTE — ED Notes (Signed)
Urine specimen and culture sent to lab. ENMiles 

## 2019-08-26 NOTE — Progress Notes (Signed)
Consult request has been received. CSW attempting to follow up at present time.  CSw saw it looked like a consult was placed and withdrawn by an EDP on an earlier shift.  CSW spoke to on-duty EDP who states no CSW needs at this time that CSW can assist with due to pt's insurances.  Please reconsult if future social work needs arise.  CSW signing off, as social work intervention is no longer needed.  Alphonse Guild. Meliza Kage, LCSW, LCAS, CSI Transitions of Care Clinical Social Worker Care Coordination Department Ph: [phone]

## 2019-08-26 NOTE — ED Notes (Signed)
Pt given sandwich and water for fluid/PO challenge.

## 2019-08-26 NOTE — ED Notes (Signed)
Pt's niece, Hoyle Sauer, called and given update on pt condition. Pt to board in the ED tonight and a SW consult has been ordered to evaluation pt's poor living condition as paramedics reported mold growth and cold temperatures in the pt's home. Niece agreeable that this plan is appropriate for the pt. Will continue to monitor pt.

## 2019-08-26 NOTE — ED Provider Notes (Signed)
Patient taken in sign out Patient with FTT situation Home health ordered. Patient wants to got to assisted living. Waiting for UA- expect d/c   Patient's UA negative. Home health ordered. Patient appears appropriate for discharge    Ned Grace 08/28/19 [id number]    Virgel Manifold, MD 08/31/19 [phone]

## 2019-08-27 DIAGNOSIS — R627 Adult failure to thrive: Secondary | ICD-10-CM | POA: Diagnosis not present

## 2019-08-27 NOTE — ED Provider Notes (Signed)
Pt's Rn request consult for PT.  Rn reports am care management requested PT consult.  Pt had evaluation and was discharged however concerns about pt's ability to care for herself and home condition were raised. EMS reported no food, no heat and large amount of mold. Pt states her niece has discussed nursing home or assisted living.  Pt states she would like to go but has no one to help her get in.  Pt states her nieces husband died and that niece checks on her.   Pt consult ordered    Sidney Ace 08/27/19 1058    Dorie Rank, MD 08/28/19 [phone]

## 2019-08-27 NOTE — Evaluation (Signed)
Physical Therapy Evaluation Patient Details Name: Nancy Bush MRN: [id number] DOB: [birth year] Today's Date: 08/27/2019   History of Present Illness  Pt admitted from home with FTT and SOB.  Pt with hx of COPD, a-fib, LBBB, RA, mitral valve replacement, pacemaker, and CHF  Clinical Impression  Pt admitted as above and presenting with functional mobility limitations 2* generalized weakness, ambulatory balance deficits and questionable safety awareness.  Pt would benefit from follow up rehab at SNF level to maximize IND and safety.  In the future, pt would be an excellent candidate for ALF.    Follow Up Recommendations SNF(unless 24/7 assist is available, then HHPT)    Equipment Recommendations  None recommended by PT    Recommendations for Other Services OT consult     Precautions / Restrictions Precautions Precautions: Fall Restrictions Weight Bearing Restrictions: No      Mobility  Bed Mobility Overal bed mobility: Needs Assistance Bed Mobility: Supine to Sit;Sit to Supine     Supine to sit: Supervision Sit to supine: Supervision      Transfers Overall transfer level: Needs assistance   Transfers: Sit to/from Stand Sit to Stand: Min guard         General transfer comment: steady assist to stand  Ambulation/Gait Ambulation/Gait assistance: Min assist Gait Distance (Feet): 60 Feet Assistive device: Rolling walker (2 wheeled);1 person hand held assist Gait Pattern/deviations: Step-through pattern;Decreased step length - right;Decreased step length - left;Shuffle;Trunk flexed Gait velocity: decr   General Gait Details: increased BOS, decreased speed and very tentative ambulating sans AD, improvement in stability noted with use of RW   Stairs            Wheelchair Mobility    Modified Rankin (Stroke Patients Only)       Balance Overall balance assessment: Needs assistance Sitting-balance support: Feet supported;No upper extremity  supported Sitting balance-Leahy Scale: Good     Standing balance support: No upper extremity supported Standing balance-Leahy Scale: Poor                               Pertinent Vitals/Pain Pain Assessment: No/denies pain    Home Living Family/patient expects to be discharged to:: Unsure                      Prior Function Level of Independence: Needs assistance   Gait / Transfers Assistance Needed: Pt uses cane out of house but furniture walks in the house  ADL's / Homemaking Assistance Needed: Assist from niece to get groceries        Hand Dominance   Dominant Hand: Right    Extremity/Trunk Assessment   Upper Extremity Assessment Upper Extremity Assessment: Generalized weakness    Lower Extremity Assessment Lower Extremity Assessment: Generalized weakness    Cervical / Trunk Assessment Cervical / Trunk Assessment: Kyphotic  Communication   Communication: HOH  Cognition Arousal/Alertness: Awake/alert Behavior During Therapy: WFL for tasks assessed/performed Overall Cognitive Status: No family/caregiver present to determine baseline cognitive functioning                                        General Comments      Exercises     Assessment/Plan    PT Assessment Patient needs continued PT services  PT Problem List Decreased strength;Decreased range of motion;Decreased activity tolerance;Decreased balance;Decreased mobility;Decreased  knowledge of use of DME;Decreased safety awareness       PT Treatment Interventions DME instruction;Gait training;Functional mobility training;Therapeutic activities;Therapeutic exercise;Patient/family education    PT Goals (Current goals can be found in the Care Plan section)  Acute Rehab PT Goals Patient Stated Goal: get warmer - warm blankets provided PT Goal Formulation: With patient Time For Goal Achievement: 09/10/19 Potential to Achieve Goals: Good    Frequency Min 3X/week    Barriers to discharge Decreased caregiver support limited home assist by niece who has just lost her husband    Co-evaluation               AM-PAC PT "6 Clicks" Mobility  Outcome Measure Help needed turning from your back to your side while in a flat bed without using bedrails?: None Help needed moving from lying on your back to sitting on the side of a flat bed without using bedrails?: None Help needed moving to and from a bed to a chair (including a wheelchair)?: A Little Help needed standing up from a chair using your arms (e.g., wheelchair or bedside chair)?: A Little Help needed to walk in hospital room?: A Little Help needed climbing 3-5 steps with a railing? : A Lot 6 Click Score: 19    End of Session Equipment Utilized During Treatment: Gait belt Activity Tolerance: Patient tolerated treatment well;Patient limited by fatigue Patient left: in bed;with call bell/phone within reach Nurse Communication: Mobility status PT Visit Diagnosis: Difficulty in walking, not elsewhere classified (R26.2);Muscle weakness (generalized) (M62.81)    Time: [id number] PT Time Calculation (min) (ACUTE ONLY): 23 min   Charges:   PT Evaluation $PT Eval Low Complexity: 1 Low          [place] Pager [phone] Office [phone]   Chrisanna Mishra 08/27/2019, 4:26 PM

## 2019-08-27 NOTE — Progress Notes (Signed)
Consult request has been received. CSW attempting to follow up at present time  Anandi Abramo M. Elexa Kivi LCSWA Transitions of Care  Clinical Social Worker  Ph: [phone] 

## 2019-08-27 NOTE — ED Notes (Signed)
PT at bedside.

## 2019-08-27 NOTE — Progress Notes (Signed)
CSW left VM for APS intake worker requesting call back to file report around EMS findings.   [place] Transitions of Care  Clinical Social Worker  Ph: [phone]

## 2019-08-28 ENCOUNTER — Other Ambulatory Visit: Payer: Self-pay

## 2019-08-28 DIAGNOSIS — R627 Adult failure to thrive: Secondary | ICD-10-CM | POA: Diagnosis not present

## 2019-08-28 NOTE — ED Notes (Signed)
Patient given lunch.

## 2019-08-28 NOTE — ED Provider Notes (Signed)
86 year old female boarding here for failure to thrive living in an unsafe living situation.  PT is evaluated her and recommends skilled nursing facility.  Social work looking for placement.   Hayden Rasmussen, MD 08/28/19 1121

## 2019-08-28 NOTE — TOC Initial Note (Signed)
Transition of Care [facility]) - Initial/Assessment Note    Patient Details  Name: Nancy Bush MRN: [id number] Date of Birth: 09/21/1925  Transition of Care [facility]) CM/SW Contact:    Janace Hoard, LCSW Phone Number: 08/28/2019, 11:29 AM  Clinical Narrative:                 CSW spoke with patient at bedside. Patient reports she would be willing to go to SNF. Patient reports she has been to [facility] in the past and enjoyed it there. CSW explained that due to COVID, they are no longer accepting new admissions. Patient reports she knows of someone who has been to [facility] and also asked about [facility] and [facility] if they were taking new admissions. CSW explained to patient that we will fax her information out to facilities and discuss what bed offers she receives. Patient also stated she would like to go to an ALF after SNF and reports she has money in her savings to cover the cost out-of-pocket.   CSW also spoke with patient's niece Hoyle Sauer who reports that family can assist getting patient into an ALF after SNF.    Expected Discharge Plan: Skilled Nursing Facility Barriers to Discharge: No Barriers Identified   Patient Goals and CMS Choice Patient states their goals for this hospitalization and ongoing recovery are:: To find better care for patient      Expected Discharge Plan and Services Expected Discharge Plan: [place] In-house Referral: Clinical Social Work   Post Acute Care Choice: [place] Living arrangements for the past 2 months: [place]                                      Prior Living Arrangements/Services Living arrangements for the past 2 months: Single Family Home Lives with:: Self Patient language and need for interpreter reviewed:: Yes Do you feel safe going back to the place where you live?: Yes      Need for Family Participation in Patient Care: Yes (Comment) Care giver support system in  place?: Yes (comment) Current home services: DME Criminal Activity/Legal Involvement Pertinent to Current Situation/Hospitalization: No - Comment as needed  Activities of Daily Living      Permission Sought/Granted Permission sought to share information with : Facility Art therapist granted to share information with : Yes, Verbal Permission Granted  Share Information with NAME: Bebe Shaggy  Permission granted to share info w AGENCY: SNFs  Permission granted to share info w Relationship: niece  Permission granted to share info w Contact Information: [phone]  Emotional Assessment Appearance:: Appears stated age Attitude/Demeanor/Rapport: Engaged Affect (typically observed): Accepting, Adaptable, Hopeful, Pleasant Orientation: : Oriented to Self, Oriented to Place, Oriented to  Time, Oriented to Situation Alcohol / Substance Use: Not Applicable Psych Involvement: No (comment)  Admission diagnosis:  SOB with Failure to Thrive  Patient Active Problem List   Diagnosis Date Noted  . Educated about COVID-19 virus infection 03/20/2019  . Hypotension 03/18/2019  . S/P MVR (mitral valve repair) 03/18/2019  . Pancytopenia ([place]) 05/21/2018  . TGA (transient global amnesia) 05/19/2018  . Essential hypertension 12/11/2017  . Medication management 12/11/2017  . Pulmonary HTN ([place]) 12/30/2016  . Protein calorie malnutrition ([place]) 08/22/2012  . CAP (community acquired pneumonia) 08/21/2012  . Hypokalemia 08/21/2012  . SOB (shortness of breath) 08/20/2012  . FTT (failure to thrive) in adult 08/20/2012  .  Leucocytosis 08/20/2012  . Atrial flutter ([place]) 06/24/2012  . CKD (chronic kidney disease), stage III 06/24/2012  . Pleural effusion 06/24/2012  . [employer] 05/25/2012  . Bradycardia 05/21/2012  . S/P mitral valve replacement with bioprosthetic valve 05/18/2012  . S/P Maze operation for atrial fibrillation 05/18/2012  . Severe mitral  regurgitation 05/17/2012  . A-fib ([place]) 05/17/2012  . MR (mitral regurgitation) 05/03/2012  . Fatigue 01/29/2012  . Acute on chronic diastolic heart failure ([place]) 06/10/2011  . CKD (chronic kidney disease) 06/03/2011  . Encounter for long-term (current) use of anticoagulants 05/26/2011  . Atrial fibrillation ([place]) 05/19/2011  . CHEST PAIN UNSPECIFIED 08/28/2010  . ABNORMAL ELECTROCARDIOGRAM 11/14/2009  . Mitral Regurgitation 11/12/2009  . PULMONARY HYPERTENSION 11/12/2009  . Mitral valve disorder 11/12/2009  . SINUSITIS, CHRONIC 06/12/2007  . ALLERGIC RHINITIS 06/12/2007  . BRONCHITIS 06/12/2007  . COPD 06/12/2007   PCP:  Lawerance Cruel, MD Pharmacy:   [id number] [place], [place] - [address] [place] [address] Phone: [phone] Fax: [phone]     Social Determinants of Health (SDOH) Interventions    Readmission Risk Interventions No flowsheet data found.

## 2019-08-28 NOTE — ED Notes (Signed)
Pt helped back into the stretcher. Pt advised to call if she needs to use the bsc. Pt denies any pain at this time. Awaiting further orders.

## 2019-08-28 NOTE — NC FL2 (Signed)
[place] MEDICAID FL2 LEVEL OF CARE SCREENING TOOL     IDENTIFICATION  Patient Name: Nancy Bush Birthdate: 1925/08/09 Sex: female Admission Date (Current Location): 08/26/2019  [facility] and [place] Number:  Herbalist and Address:  [facility],  [place] [address], [place]      Provider Number: [id number]  Attending Physician Name and Address:  Default, Provider, MD  Relative Name and Phone Number:  Bebe Shaggy (niece) [phone] or [phone]    Current Level of Care: Hospital Recommended Level of Care: [place] Prior Approval Number:    Date Approved/Denied:   PASRR Number: [id number]  Discharge Plan: SNF    Current Diagnoses: Patient Active Problem List   Diagnosis Date Noted  . Educated about COVID-19 virus infection 03/20/2019  . Hypotension 03/18/2019  . S/P MVR (mitral valve repair) 03/18/2019  . Pancytopenia ([place]) 05/21/2018  . TGA (transient global amnesia) 05/19/2018  . Essential hypertension 12/11/2017  . Medication management 12/11/2017  . Pulmonary HTN ([place]) 12/30/2016  . Protein calorie malnutrition ([place]) 08/22/2012  . CAP (community acquired pneumonia) 08/21/2012  . Hypokalemia 08/21/2012  . SOB (shortness of breath) 08/20/2012  . FTT (failure to thrive) in adult 08/20/2012  . Leucocytosis 08/20/2012  . Atrial flutter ([place]) 06/24/2012  . CKD (chronic kidney disease), stage III 06/24/2012  . Pleural effusion 06/24/2012  . [employer] 05/25/2012  . Bradycardia 05/21/2012  . S/P mitral valve replacement with bioprosthetic valve 05/18/2012  . S/P Maze operation for atrial fibrillation 05/18/2012  . Severe mitral regurgitation 05/17/2012  . A-fib ([place]) 05/17/2012  . MR (mitral regurgitation) 05/03/2012  . Fatigue 01/29/2012  . Acute on chronic diastolic heart failure ([place]) 06/10/2011  . CKD (chronic kidney disease) 06/03/2011  . Encounter for long-term (current) use of  anticoagulants 05/26/2011  . Atrial fibrillation ([place]) 05/19/2011  . CHEST PAIN UNSPECIFIED 08/28/2010  . ABNORMAL ELECTROCARDIOGRAM 11/14/2009  . Mitral Regurgitation 11/12/2009  . PULMONARY HYPERTENSION 11/12/2009  . Mitral valve disorder 11/12/2009  . SINUSITIS, CHRONIC 06/12/2007  . ALLERGIC RHINITIS 06/12/2007  . BRONCHITIS 06/12/2007  . COPD 06/12/2007    Orientation RESPIRATION BLADDER Height & Weight     Self, Time, Situation, Place  Normal Continent Weight: 105 lb (47.6 kg) Height:  5' (152.4 cm)  BEHAVIORAL SYMPTOMS/MOOD NEUROLOGICAL BOWEL NUTRITION STATUS      Continent Diet(regular)  AMBULATORY STATUS COMMUNICATION OF NEEDS Skin   Limited Assist Verbally Normal                       Personal Care Assistance Level of Assistance  Bathing, Feeding, Dressing Bathing Assistance: Limited assistance Feeding assistance: Independent Dressing Assistance: Limited assistance     Functional Limitations Info  Sight, Hearing, Speech Sight Info: Adequate Hearing Info: Impaired(hard of hearing) Speech Info: Adequate    SPECIAL CARE FACTORS FREQUENCY  PT (By licensed PT), OT (By licensed OT)     PT Frequency: 5x weekly OT Frequency: 5x weekly            Contractures Contractures Info: Not present    Additional Factors Info  Code Status, Allergies Code Status Info: Full Allergies Info: Promethazine Hcl, Septra (Sulfamethoxazole-trimethoprim), Sulfonamide Derivatives, Tequin           Current Medications (08/28/2019):  This is the current hospital active medication list Current Facility-Administered Medications  Medication Dose Route Frequency Provider Last Rate Last Admin  . apixaban (ELIQUIS) tablet 2.5 mg  2.5 mg Oral BID Ruthann Cancer,  Jesse Fall, PA-C   2.5 mg at 08/28/19 1016   Current Outpatient Medications  Medication Sig Dispense Refill  . Calcium Carbonate-Vitamin D (CALTRATE 600+D) 600-400 MG-UNIT per tablet Take 1 tablet by mouth daily.     Marland Kitchen  ELIQUIS 2.5 MG TABS tablet TAKE 1 TABLET BY MOUTH TWICE A DAY (Patient taking differently: Take 2.5 mg by mouth 2 (two) times daily. ) 180 tablet 1  . ENSURE PLUS (ENSURE PLUS) LIQD Take 1 Can by mouth daily.    . feeding supplement (BOOST HIGH PROTEIN) LIQD Take 1 Container by mouth daily.    . furosemide (LASIX) 20 MG tablet Take 60-80 mg by mouth See admin instructions. TAKE MON, WED AND Friday, OK TO TAKE PRN SWELLING    . metoprolol succinate (TOPROL-XL) 25 MG 24 hr tablet Take 1 tablet (25 mg total) by mouth daily. 180 tablet 1  . montelukast (SINGULAIR) 10 MG tablet Take 10 mg by mouth at bedtime.     . Multiple Vitamins-Minerals (PRESERVISION AREDS PO) Take 1 tablet by mouth 2 (two) times daily. Preservision Areds     . nitroGLYCERIN (NITROSTAT) 0.4 MG SL tablet Place 0.4 mg under the tongue every 5 (five) minutes as needed for chest pain (x 3 tabs daily).    . predniSONE (DELTASONE) 5 MG tablet Take 2.5 mg by mouth every other day.        Discharge Medications: Please see discharge summary for a list of discharge medications.  Relevant Imaging Results:  Relevant Lab Results:   Additional Information SS# [id number]  Janace Hoard, LCSW

## 2019-08-29 DIAGNOSIS — I469 Cardiac arrest, cause unspecified: Secondary | ICD-10-CM | POA: Diagnosis not present

## 2019-08-29 DIAGNOSIS — Z743 Need for continuous supervision: Secondary | ICD-10-CM | POA: Diagnosis not present

## 2019-08-29 DIAGNOSIS — R279 Unspecified lack of coordination: Secondary | ICD-10-CM | POA: Diagnosis not present

## 2019-08-29 DIAGNOSIS — I959 Hypotension, unspecified: Secondary | ICD-10-CM | POA: Diagnosis not present

## 2019-08-29 DIAGNOSIS — R627 Adult failure to thrive: Secondary | ICD-10-CM | POA: Diagnosis not present

## 2019-08-29 LAB — RESPIRATORY PANEL BY RT PCR (FLU A&B, COVID)
Influenza A by PCR: NEGATIVE
Influenza B by PCR: NEGATIVE
SARS Coronavirus 2 by RT PCR: NEGATIVE

## 2019-08-29 NOTE — ED Notes (Signed)
Pt discharged safely with PTAR.  Prior to leaving IV was removed from left forearm without difficulty.

## 2019-08-29 NOTE — Progress Notes (Signed)
CSW spoke with patient about current bed offers. Patient has been offers to [facility], [facility], and [facility]. [facility] no longer has any beds and patient declined to go to [facility] and [facility]. Patient asked if she could go to [facility] (no beds), [facility] (covid outbreak), and [facility] (receiving no answer). Patient was very adamant about not going to [facility], who is willing to take her today. Patient reports she is okay with returning home and her family can assist her getting into an ALF. CSW called patient's niece to update her. CSW consulted with RN CM Edwin Cap for assistance in getting patient set up with [facility] services while she is at home awaiting to go to an ALF.   [place], LCSW Transitions of Care Department [facility] ED [phone]

## 2019-08-29 NOTE — TOC Initial Note (Addendum)
Transition of Care [facility]) - Initial/Assessment Note    Patient Details  Name: Nancy Bush MRN: [id number] Date of Birth: 1926-01-03  Transition of Care [facility]) CM/SW Contact:    Erenest Rasher, RN Phone Number: [phone] 08/29/2019, 1:13 PM  Clinical Narrative:                 Pt declines accepting SNF's and is requesting to go home. Offered choice. [place] [facility] program arranged. Pt agreeable to [facility], PT, OT, aide and SW  with [facility]. States she mainly uses her cane. Her goal is to go to an ALF in the future. PTAR arranged. Updated her niece, Bebe Shaggy.   Expected Discharge Plan: Skilled Nursing Facility Barriers to Discharge: No Barriers Identified   Patient Goals and CMS Choice Patient states their goals for this hospitalization and ongoing recovery are:: i decided to go to ALF, my niece is going to help CMS Medicare.gov Compare Post Acute Care list provided to:: Patient    Expected Discharge Plan and Services Expected Discharge Plan: [place] In-house Referral: Clinical Social Work Discharge Planning Services: CM Consult Post Acute Care Choice: [place] Living arrangements for the past 2 months: Single Family Home                           HH Arranged: RN, PT, OT, Nurse's Aide, Social Work [employer] Agency: [place] Date [place]: 08/29/19 Time [place]: 1311 Representative spoke with at [place]: Adela Lank  Prior Living Arrangements/Services Living arrangements for the past 2 months: [place] with:: Self Patient language and need for interpreter reviewed:: Yes Do you feel safe going back to the place where you live?: Yes      Need for Family Participation in Patient Care: Yes (Comment) Care giver support system in place?: Yes (comment) Current home services: (rolling walker with seat, cane) Criminal Activity/Legal Involvement Pertinent to Current  Situation/Hospitalization: No - Comment as needed  Activities of Daily Living      Permission Sought/Granted Permission sought to share information with : Case Manager, PCP, Family Supports Permission granted to share information with : Yes, Verbal Permission Granted  Share Information with NAME: Bebe Shaggy  Permission granted to share info w AGENCY: [place] granted to share info w Relationship: niece  Permission granted to share info w Contact Information: [id number]  Emotional Assessment Appearance:: Appears stated age Attitude/Demeanor/Rapport: Gracious Affect (typically observed): Accepting Orientation: : Oriented to Self, Oriented to Place, Oriented to  Time, Oriented to Situation Alcohol / Substance Use: Not Applicable Psych Involvement: No (comment)  Admission diagnosis:  SOB with Failure to Thrive  Patient Active Problem List   Diagnosis Date Noted  . Educated about COVID-19 virus infection 03/20/2019  . Hypotension 03/18/2019  . S/P MVR (mitral valve repair) 03/18/2019  . Pancytopenia ([place]) 05/21/2018  . TGA (transient global amnesia) 05/19/2018  . Essential hypertension 12/11/2017  . Medication management 12/11/2017  . Pulmonary HTN ([place]) 12/30/2016  . Protein calorie malnutrition ([place]) 08/22/2012  . CAP (community acquired pneumonia) 08/21/2012  . Hypokalemia 08/21/2012  . SOB (shortness of breath) 08/20/2012  . FTT (failure to thrive) in adult 08/20/2012  . Leucocytosis 08/20/2012  . Atrial flutter ([place]) 06/24/2012  . CKD (chronic kidney disease), stage III 06/24/2012  . Pleural effusion 06/24/2012  . [employer] 05/25/2012  . Bradycardia 05/21/2012  . S/P mitral valve  replacement with bioprosthetic valve 05/18/2012  . S/P Maze operation for atrial fibrillation 05/18/2012  . Severe mitral regurgitation 05/17/2012  . A-fib ([place]) 05/17/2012  . MR (mitral regurgitation) 05/03/2012  . Fatigue 01/29/2012  . Acute on chronic  diastolic heart failure ([place]) 06/10/2011  . CKD (chronic kidney disease) 06/03/2011  . Encounter for long-term (current) use of anticoagulants 05/26/2011  . Atrial fibrillation ([place]) 05/19/2011  . CHEST PAIN UNSPECIFIED 08/28/2010  . ABNORMAL ELECTROCARDIOGRAM 11/14/2009  . Mitral Regurgitation 11/12/2009  . PULMONARY HYPERTENSION 11/12/2009  . Mitral valve disorder 11/12/2009  . SINUSITIS, CHRONIC 06/12/2007  . ALLERGIC RHINITIS 06/12/2007  . BRONCHITIS 06/12/2007  . COPD 06/12/2007   PCP:  Lawerance Cruel, MD Pharmacy:   [id number] [place], [place] - [address] [place]  [address] Phone: [phone] Fax: [phone]     Social Determinants of Health (SDOH) Interventions    Readmission Risk Interventions No flowsheet data found.

## 2019-08-29 NOTE — ED Notes (Signed)
Pt ambulated to bathroom with minimal assistance.  She can walk without assistance with her walker.

## 2019-08-29 NOTE — ED Notes (Signed)
Assisted pt to the bsc. Pt placed back in bed, call bell at the bedside. Pt in no apparent distress or pain. Will continue to monitor.

## 2019-08-29 NOTE — ED Notes (Signed)
Patient is calm and cooperative.  Call bell was given to patient and she is able to call when she needs [place].

## 2019-08-29 NOTE — Progress Notes (Addendum)
TOC CM contacted [facility], [facility] and [facility] did not have bed availability. Pt did get acceptance at [facility]. Pt declines [facility]. Pt is requesting wants to go home with [facility]. Offered choice for [facility]. Agreeable to [facility]. CM contacted niece, Bebe Shaggy and agreeable to pt's going home. Pt's home has increased items and crowdedness through out the home which the clutter impedes HHPT work with her in the home. Niece helps with getting her meds and grocery to her door. Pt has financial means to pay out of pocket for caregiver. Per niece, goal ALF in the future.  Jonnie Finner RN CCM, [facility] ED TOC CM [phone]  12:48 pm Alvis Lemmings accepted referral. Jonnie Finner RN [place], [place] ED TOC CM [phone]

## 2019-08-30 ENCOUNTER — Other Ambulatory Visit: Payer: Self-pay | Admitting: Cardiology

## 2019-08-30 ENCOUNTER — Telehealth: Payer: Self-pay | Admitting: General Practice

## 2019-08-30 DIAGNOSIS — J309 Allergic rhinitis, unspecified: Secondary | ICD-10-CM | POA: Diagnosis not present

## 2019-08-30 DIAGNOSIS — Z9181 History of falling: Secondary | ICD-10-CM | POA: Diagnosis not present

## 2019-08-30 DIAGNOSIS — Z9049 Acquired absence of other specified parts of digestive tract: Secondary | ICD-10-CM | POA: Diagnosis not present

## 2019-08-30 DIAGNOSIS — I272 Pulmonary hypertension, unspecified: Secondary | ICD-10-CM | POA: Diagnosis not present

## 2019-08-30 DIAGNOSIS — Z90711 Acquired absence of uterus with remaining cervical stump: Secondary | ICD-10-CM | POA: Diagnosis not present

## 2019-08-30 DIAGNOSIS — I4892 Unspecified atrial flutter: Secondary | ICD-10-CM | POA: Diagnosis not present

## 2019-08-30 DIAGNOSIS — N183 Chronic kidney disease, stage 3 unspecified: Secondary | ICD-10-CM | POA: Diagnosis not present

## 2019-08-30 DIAGNOSIS — Z953 Presence of xenogenic heart valve: Secondary | ICD-10-CM | POA: Diagnosis not present

## 2019-08-30 DIAGNOSIS — G454 Transient global amnesia: Secondary | ICD-10-CM | POA: Diagnosis not present

## 2019-08-30 DIAGNOSIS — M069 Rheumatoid arthritis, unspecified: Secondary | ICD-10-CM | POA: Diagnosis not present

## 2019-08-30 DIAGNOSIS — Z8701 Personal history of pneumonia (recurrent): Secondary | ICD-10-CM | POA: Diagnosis not present

## 2019-08-30 DIAGNOSIS — I447 Left bundle-branch block, unspecified: Secondary | ICD-10-CM | POA: Diagnosis not present

## 2019-08-30 DIAGNOSIS — I959 Hypotension, unspecified: Secondary | ICD-10-CM | POA: Diagnosis not present

## 2019-08-30 DIAGNOSIS — J329 Chronic sinusitis, unspecified: Secondary | ICD-10-CM | POA: Diagnosis not present

## 2019-08-30 DIAGNOSIS — E46 Unspecified protein-calorie malnutrition: Secondary | ICD-10-CM | POA: Diagnosis not present

## 2019-08-30 DIAGNOSIS — J449 Chronic obstructive pulmonary disease, unspecified: Secondary | ICD-10-CM | POA: Diagnosis not present

## 2019-08-30 DIAGNOSIS — H9190 Unspecified hearing loss, unspecified ear: Secondary | ICD-10-CM | POA: Diagnosis not present

## 2019-08-30 DIAGNOSIS — I4891 Unspecified atrial fibrillation: Secondary | ICD-10-CM | POA: Diagnosis not present

## 2019-08-30 DIAGNOSIS — Z7901 Long term (current) use of anticoagulants: Secondary | ICD-10-CM | POA: Diagnosis not present

## 2019-08-30 DIAGNOSIS — D61818 Other pancytopenia: Secondary | ICD-10-CM | POA: Diagnosis not present

## 2019-08-30 DIAGNOSIS — I493 Ventricular premature depolarization: Secondary | ICD-10-CM | POA: Diagnosis not present

## 2019-08-30 DIAGNOSIS — I5033 Acute on chronic diastolic (congestive) heart failure: Secondary | ICD-10-CM | POA: Diagnosis not present

## 2019-08-30 DIAGNOSIS — I130 Hypertensive heart and chronic kidney disease with heart failure and stage 1 through stage 4 chronic kidney disease, or unspecified chronic kidney disease: Secondary | ICD-10-CM | POA: Diagnosis not present

## 2019-08-30 DIAGNOSIS — Z7952 Long term (current) use of systemic steroids: Secondary | ICD-10-CM | POA: Diagnosis not present

## 2019-08-30 DIAGNOSIS — Z950 Presence of cardiac pacemaker: Secondary | ICD-10-CM | POA: Diagnosis not present

## 2019-08-30 NOTE — Telephone Encounter (Signed)
Called patient to discuss keeping appointment for tomorrow- patient did not answer, no voicemail to leave message.

## 2019-08-30 NOTE — Telephone Encounter (Signed)
Patient was recently in the hospital and she is calling to know if she needs to keep her appt for tomorrow 1/27.

## 2019-08-30 NOTE — Progress Notes (Signed)
Cardiology Clinic Note   Patient Name: Nancy Bush Date of Encounter: 08/31/2019  Primary Care Provider:  Lawerance Cruel, MD Primary Cardiologist:  Minus Breeding, MD  Patient Profile    Nancy Corona. Bush 86 year old female presents today for follow-up of her atrial fibrillation, mitral regurgitation, acute on chronic diastolic heart failure, and pulmonary hypertension.  Past Medical History    Past Medical History:  Diagnosis Date  . Asthma   . Atrial fibrillation ([place])   . CHF (congestive heart failure) ([place])   . COPD (chronic obstructive pulmonary disease) ([place])   . H/O: hysterectomy   . Hypertension   . LBBB (left bundle branch block)    per discharge note 2003  . Mitral regurgitation    MILD  . MVP (mitral valve prolapse)   . Pacemaker   . Pulmonary hypertension ([place])    MILD TO MODERATE BY ECHO  . PVC (premature ventricular contraction)   . Rheumatoid arthritis(714.0)   . S/P Maze operation for atrial fibrillation 05/18/2012   Complete biatrial lesion set using cryothermy with oversewing of LA appendage  . S/P mitral valve replacement with bioprosthetic valve 05/18/2012   100mm Edwards Magna Mitral pericardial bioprosthesis    Past Surgical History:  Procedure Laterality Date  . ABDOMINAL HYSTERECTOMY    . APPENDECTOMY    . CATARACT EXTRACTION    . CHOLECYSTECTOMY    . hysterectomy    . MAZE  05/18/2012   Procedure: MAZE;  Surgeon: Rexene Alberts, MD;  Location: [place];  Service: Open Heart Surgery;  Laterality: Right;  . MITRAL VALVE REPLACEMENT  05/18/2012   Procedure: MINIMALLY INVASIVE MITRAL VALVE (MV) REPLACEMENT;  Surgeon: Rexene Alberts, MD;  Location: [place];  Service: Open Heart Surgery;  Laterality: N/A;  . OVARIAN CYST REMOVAL    . PACEMAKER INSERTION    . PERMANENT PACEMAKER INSERTION N/A 05/21/2012   Procedure: PERMANENT PACEMAKER INSERTION;  Surgeon: Evans Lance, MD;  Location: [facility] CATH LAB;  Service: Cardiovascular;   Laterality: N/A;  . TEE WITHOUT CARDIOVERSION  02/09/2012   Procedure: TRANSESOPHAGEAL ECHOCARDIOGRAM (TEE);  Surgeon: Fay Records, MD;  Location: [facility] ENDOSCOPY;  Service: Cardiovascular;  Laterality: N/A;    Allergies  Allergies  Allergen Reactions  . Promethazine Hcl Other (See Comments)    unknown  . Septra [Sulfamethoxazole-Trimethoprim]   . Sulfonamide Derivatives Other (See Comments)    unknown  . Tequin Other (See Comments)    hallucinations     History of Present Illness    Nancy Bush has past medical history of mitral valve disease status post mitral valve replacement with bioprosthetic valve and Maze procedure on 05/2012, symptomatic bradycardia status post PPM implantation, and paroxysmal atrial fibrillation.  She has been noted to have dyspnea on exertion and weakness.  She was last seen via virtual platform by Dr. Percival Spanish on 03/18/2019.  During that time she indicated she had some edema in her left arm and also in her bilateral lower extremities.  Her diuretics are dosed by her PCP.  She denied PND and orthopnea.  She also denied palpitations, presyncope and syncope.  She is not having any chest pain, arm or neck discomfort.  She indicated that she was getting around fairly well and quarantining from the coronavirus.  Her niece was also checking her blood pressure at that time and she stated it had been running low.  Due to her dyspnea on exertion her oxygen saturation was monitored as she walked to the  exam room during her last office appointment.  Her oxygen saturation remained at 98%.  She denied chest pain or edema at that time.  She presented to the ED on 08/26/19 with increasing SOB x2-3 days. She states that her SOB would get worse with exertion and she complained of being very fatigued. She indicated that she had been more SOB since around Christmas and had increased weakness. Her POA (niece Caroyln) was concerned that she may have gone into A. Fib and that her diet  was poor.   She presents the clinic today and states she continues to have shortness of breath with activity.  However her breathing returns to normal when she is less active.  She states she mostly eats foods that are brought to her house from previous and prepares things such as eggs, cheese, and peanut butter.  She states that she does not have any trouble with managing her medications.  She did indicate that Case management/social work have an appointment to talk about placement in a SNF or assisted living facility.  She denies chest pain, increased shortness of breath, lower extremity edema, fatigue, palpitations, melena, hematuria, hemoptysis, diaphoresis, weakness, presyncope, syncope, orthopnea, and PND.   Home Medications    Prior to Admission medications   Medication Sig Start Date End Date Taking? Authorizing Provider  apixaban (ELIQUIS) 2.5 MG TABS tablet Take 1 tablet (2.5 mg total) by mouth 2 (two) times daily. 08/30/19   Minus Breeding, MD  Calcium Carbonate-Vitamin D (CALTRATE 600+D) 600-400 MG-UNIT per tablet Take 1 tablet by mouth daily.     [provider]  ENSURE PLUS (ENSURE PLUS) LIQD Take 1 Can by mouth daily.    [provider]  feeding supplement (BOOST HIGH PROTEIN) LIQD Take 1 Container by mouth daily.    [provider]  furosemide (LASIX) 20 MG tablet Take 60-80 mg by mouth See admin instructions. TAKE MON, WED AND Friday, OK TO TAKE PRN SWELLING    [provider]  metoprolol succinate (TOPROL-XL) 25 MG 24 hr tablet Take 1 tablet (25 mg total) by mouth daily. 07/05/19   Minus Breeding, MD  montelukast (SINGULAIR) 10 MG tablet Take 10 mg by mouth at bedtime.  12/31/12   [provider]  Multiple Vitamins-Minerals (PRESERVISION AREDS PO) Take 1 tablet by mouth 2 (two) times daily. Preservision Areds     [provider]  nitroGLYCERIN (NITROSTAT) 0.4 MG SL tablet Place 0.4 mg under the tongue every 5 (five) minutes as  needed for chest pain (x 3 tabs daily).    [provider]  predniSONE (DELTASONE) 5 MG tablet Take 2.5 mg by mouth every other day.     [provider]    Family History    No family history on file. She indicated that her mother is deceased. She indicated that her father is deceased. She indicated that her maternal grandmother is deceased. She indicated that her maternal grandfather is deceased. She indicated that her paternal grandmother is deceased. She indicated that her paternal grandfather is deceased.  Social History    Social History   Socioeconomic History  . Marital status: Single    Spouse name: Not on file  . Number of children: Not on file  . Years of education: Not on file  . Highest education level: Not on file  Occupational History  . Not on file  Tobacco Use  . Smoking status: Never Smoker  . Smokeless tobacco: Never Used  Substance and Sexual Activity  .  Alcohol use: No  . Drug use: No  . Sexual activity: Not Currently    Birth control/protection: Post-menopausal  Other Topics Concern  . Not on file  Social History Narrative  . Not on file   Social Determinants of Health   Financial Resource Strain:   . Difficulty of Paying Living Expenses: Not on file  Food Insecurity:   . Worried About Charity fundraiser in the Last Year: Not on file  . Ran Out of Food in the Last Year: Not on file  Transportation Needs:   . Lack of Transportation (Medical): Not on file  . Lack of Transportation (Non-Medical): Not on file  Physical Activity:   . Days of Exercise per Week: Not on file  . Minutes of Exercise per Session: Not on file  Stress:   . Feeling of Stress : Not on file  Social Connections:   . Frequency of Communication with Friends and Family: Not on file  . Frequency of Social Gatherings with Friends and Family: Not on file  . Attends Religious Services: Not on file  . Active Member of Clubs or Organizations: Not on file  . Attends  Archivist Meetings: Not on file  . Marital Status: Not on file  Intimate Partner Violence:   . Fear of Current or Ex-Partner: Not on file  . Emotionally Abused: Not on file  . Physically Abused: Not on file  . Sexually Abused: Not on file     Review of Systems    General:  No chills, fever, night sweats or weight changes.  Cardiovascular:  No chest pain, dyspnea on exertion, edema, orthopnea, palpitations, paroxysmal nocturnal dyspnea. Dermatological: No rash, lesions/masses Respiratory: No cough, dyspnea Urologic: No hematuria, dysuria Abdominal:   No nausea, vomiting, diarrhea, bright red blood per rectum, melena, or hematemesis Neurologic:  No visual changes, wkns, changes in mental status. All other systems reviewed and are otherwise negative except as noted above.  Physical Exam    VS:  BP 121/63 (BP Location: Left Arm, Patient Position: Sitting, Cuff Size: Small)   Pulse (!) 59   Wt 88 lb 6.4 oz (40.1 kg)   BMI 17.26 kg/m  , BMI Body mass index is 17.26 kg/m. GEN: Well nourished, well developed, in no acute distress. HEENT: normal. Neck: Supple, no JVD, carotid bruits, or masses. Cardiac: RRR, no murmurs, rubs, or gallops. No clubbing, cyanosis, edema.  Radials/DP/PT 2+ and equal bilaterally.  Respiratory:  Respirations regular and unlabored, clear to auscultation bilaterally. GI: Soft, nontender, nondistended, BS + x 4. MS: no deformity or atrophy. Skin: warm and dry, no rash. Neuro:  Strength and sensation are intact. Psych: Normal affect.  Accessory Clinical Findings    ECG personally reviewed by me today-nonspecific intraventricular block right ventricular hypertrophy inferior infarct undetermined age anterior lateral infarct undetermined age 50 bpm- No acute changes  EKG 08/29/2019 Atrial paced 60 bpm  Echocardiogram 05/20/2018 Study Conclusions  - Left ventricle: The cavity size was normal. Wall thickness was   increased in a pattern of mild  LVH. Systolic function was normal.   The estimated ejection fraction was in the range of 60% to 65%.   Wall motion was normal; there were no regional wall motion   abnormalities. The study was not technically sufficient to allow   evaluation of LV diastolic dysfunction due to atrial   fibrillation. - Ventricular septum: D-shaped interventricular septum suggestive   of RV pressure/volume overload. - Aortic valve: Trileaflet; moderately calcified leaflets.  There   was no stenosis. - Mitral valve: Bioprosthetic mitral valve. There does not appear   to be significant stenosis or regurgitation. Pressure half-time:   70 ms. Mean gradient (D): 5 mm Hg. Valve area by pressure   half-time: 3.14 cm^2. - Left atrium: The atrium was severely dilated. - Right ventricle: The cavity size was moderately dilated. Pacer   wire or catheter noted in right ventricle. Systolic function was   mildly reduced. - Right atrium: The atrium was severely dilated. - Tricuspid valve: There was severe regurgitation. There was   systolic flow reversal in the hepatic vein doppler pattern. Peak   RV-RA gradient (S): 56 mm Hg. - Pulmonary arteries: PA peak pressure: 71 mm Hg (S). - Systemic veins: IVC measured 2.4 cm with < 50% respirophasic   variation, suggesting RA pressure 15 mmHg.   Assessment & Plan   1.  Paroxysmal atrial fibrillation-heart rate today 63.  States she does not notice rapid heart rate or palpitations. Continue apixaban 2.5 mg twice daily Continue metoprolol succinate 12.5 mg daily Avoid triggers caffeine, chocolate, EtOH, etc. Increase physical activity as tolerated  Hypotension-BP today 121/63 reduce metoprolol succinate to 12.5 mg daily Increase p.o. hydration and caloric intake  MVR/TR-on repeat echocardiogram in 05/2018 MVR remained stable and her TR was severe.  Plan to manage this clinically given her advanced age. Continue metoprolol succinate 12.5 mg daily Continue furosemide 20 mg  Monday, Wednesday, and Friday.  Also as needed for swelling  Maintain physical activity as tolerated Heart healthy   Failure to thrive-recent hospital admission. 08/26/2019-08/29/2019 for failure to thrive.  EMS was called to patient's home due to feeling short of breath for the past 2-3 days.  When EMS entered her home they noted there was no heat, no food, and a large amount of mold.  She indicated that her niece has been checking on her.  A consult to CSW was made and are working on ALF/SNF placement.  Disposition: Follow-up with Dr. Percival Spanish in 3 months.  Jossie Ng. [facility] [place] [address] Office [phone] Fax [phone]

## 2019-08-31 ENCOUNTER — Ambulatory Visit (INDEPENDENT_AMBULATORY_CARE_PROVIDER_SITE_OTHER): Payer: Medicare Other | Admitting: General Practice

## 2019-08-31 ENCOUNTER — Other Ambulatory Visit: Payer: Self-pay

## 2019-08-31 ENCOUNTER — Encounter: Payer: Self-pay | Admitting: General Practice

## 2019-08-31 VITALS — BP 121/63 | HR 59 | Wt 88.4 lb

## 2019-08-31 DIAGNOSIS — R627 Adult failure to thrive: Secondary | ICD-10-CM | POA: Diagnosis not present

## 2019-08-31 DIAGNOSIS — Z9889 Other specified postprocedural states: Secondary | ICD-10-CM | POA: Diagnosis not present

## 2019-08-31 DIAGNOSIS — I10 Essential (primary) hypertension: Secondary | ICD-10-CM | POA: Diagnosis not present

## 2019-08-31 DIAGNOSIS — I071 Rheumatic tricuspid insufficiency: Secondary | ICD-10-CM | POA: Diagnosis not present

## 2019-08-31 DIAGNOSIS — I480 Paroxysmal atrial fibrillation: Secondary | ICD-10-CM | POA: Diagnosis not present

## 2019-08-31 MED ORDER — METOPROLOL SUCCINATE ER 25 MG PO TB24: 12.5000 mg | ORAL_TABLET | Freq: Every day | ORAL | 1 refills | Status: DC

## 2019-08-31 NOTE — Telephone Encounter (Signed)
No answer on number listed.. same as number listed on DPR and says to LM but no answering machine or voicemail.

## 2019-08-31 NOTE — Patient Instructions (Signed)
Medication Instructions:  RE-START METOPROLOL SUCCINATE 12.5MG  DAILY If you need a refill on your cardiac medications before your next appointment, please call your pharmacy.  Special Instructions: INCREASE CALORIC INTAKE-TRY TO EAT MORE  MAKE SURE TO FOLLOW UP WITH SOCIAL WORKER  Reduce your risk of getting COVID-19 With your heart disease it is especially important for people at increased risk of severe illness from COVID-19, and those who live with them, to protect themselves from getting COVID-19. The best way to protect yourself and to help reduce the spread of the virus that causes COVID-19 is to: Marland Kitchen Limit your interactions with other people as much as possible. . Take precautions to prevent getting COVID-19 when you do interact with others. If you start feeling sick and think you may have COVID-19, get in touch with your healthcare provider within 24 hours.  Follow-Up: 3 months  Virtual Visit  Minus Breeding, MD.    At [facility], you and your health needs are our priority.  As part of our continuing mission to provide you with exceptional heart care, we have created designated Provider Care Teams.  These Care Teams include your primary Cardiologist (physician) and Advanced Practice Providers (APPs -  Physician Assistants and Nurse Practitioners) who all work together to provide you with the care you need, when you need it.  Thank you for choosing [facility]!!

## 2019-09-01 DIAGNOSIS — I130 Hypertensive heart and chronic kidney disease with heart failure and stage 1 through stage 4 chronic kidney disease, or unspecified chronic kidney disease: Secondary | ICD-10-CM | POA: Diagnosis not present

## 2019-09-01 DIAGNOSIS — J449 Chronic obstructive pulmonary disease, unspecified: Secondary | ICD-10-CM | POA: Diagnosis not present

## 2019-09-01 DIAGNOSIS — I447 Left bundle-branch block, unspecified: Secondary | ICD-10-CM | POA: Diagnosis not present

## 2019-09-01 DIAGNOSIS — I5033 Acute on chronic diastolic (congestive) heart failure: Secondary | ICD-10-CM | POA: Diagnosis not present

## 2019-09-01 DIAGNOSIS — I4891 Unspecified atrial fibrillation: Secondary | ICD-10-CM | POA: Diagnosis not present

## 2019-09-01 DIAGNOSIS — N183 Chronic kidney disease, stage 3 unspecified: Secondary | ICD-10-CM | POA: Diagnosis not present

## 2019-09-02 DIAGNOSIS — M069 Rheumatoid arthritis, unspecified: Secondary | ICD-10-CM | POA: Diagnosis not present

## 2019-09-02 DIAGNOSIS — E46 Unspecified protein-calorie malnutrition: Secondary | ICD-10-CM | POA: Diagnosis not present

## 2019-09-02 DIAGNOSIS — J45909 Unspecified asthma, uncomplicated: Secondary | ICD-10-CM | POA: Diagnosis not present

## 2019-09-02 DIAGNOSIS — R531 Weakness: Secondary | ICD-10-CM | POA: Diagnosis not present

## 2019-09-05 DIAGNOSIS — I5033 Acute on chronic diastolic (congestive) heart failure: Secondary | ICD-10-CM | POA: Diagnosis not present

## 2019-09-05 DIAGNOSIS — I4891 Unspecified atrial fibrillation: Secondary | ICD-10-CM | POA: Diagnosis not present

## 2019-09-05 DIAGNOSIS — I447 Left bundle-branch block, unspecified: Secondary | ICD-10-CM | POA: Diagnosis not present

## 2019-09-05 DIAGNOSIS — J449 Chronic obstructive pulmonary disease, unspecified: Secondary | ICD-10-CM | POA: Diagnosis not present

## 2019-09-05 DIAGNOSIS — N183 Chronic kidney disease, stage 3 unspecified: Secondary | ICD-10-CM | POA: Diagnosis not present

## 2019-09-05 DIAGNOSIS — I130 Hypertensive heart and chronic kidney disease with heart failure and stage 1 through stage 4 chronic kidney disease, or unspecified chronic kidney disease: Secondary | ICD-10-CM | POA: Diagnosis not present

## 2019-09-07 DIAGNOSIS — I4891 Unspecified atrial fibrillation: Secondary | ICD-10-CM | POA: Diagnosis not present

## 2019-09-07 DIAGNOSIS — I5033 Acute on chronic diastolic (congestive) heart failure: Secondary | ICD-10-CM | POA: Diagnosis not present

## 2019-09-07 DIAGNOSIS — N183 Chronic kidney disease, stage 3 unspecified: Secondary | ICD-10-CM | POA: Diagnosis not present

## 2019-09-07 DIAGNOSIS — I130 Hypertensive heart and chronic kidney disease with heart failure and stage 1 through stage 4 chronic kidney disease, or unspecified chronic kidney disease: Secondary | ICD-10-CM | POA: Diagnosis not present

## 2019-09-07 DIAGNOSIS — J449 Chronic obstructive pulmonary disease, unspecified: Secondary | ICD-10-CM | POA: Diagnosis not present

## 2019-09-07 DIAGNOSIS — I447 Left bundle-branch block, unspecified: Secondary | ICD-10-CM | POA: Diagnosis not present

## 2019-09-12 DIAGNOSIS — Z1152 Encounter for screening for COVID-19: Secondary | ICD-10-CM | POA: Diagnosis not present

## 2019-09-12 DIAGNOSIS — Z111 Encounter for screening for respiratory tuberculosis: Secondary | ICD-10-CM | POA: Diagnosis not present

## 2019-09-14 DIAGNOSIS — Z1152 Encounter for screening for COVID-19: Secondary | ICD-10-CM | POA: Diagnosis not present

## 2019-09-16 DIAGNOSIS — I5033 Acute on chronic diastolic (congestive) heart failure: Secondary | ICD-10-CM | POA: Diagnosis not present

## 2019-09-16 DIAGNOSIS — J449 Chronic obstructive pulmonary disease, unspecified: Secondary | ICD-10-CM | POA: Diagnosis not present

## 2019-09-16 DIAGNOSIS — I447 Left bundle-branch block, unspecified: Secondary | ICD-10-CM | POA: Diagnosis not present

## 2019-09-16 DIAGNOSIS — I130 Hypertensive heart and chronic kidney disease with heart failure and stage 1 through stage 4 chronic kidney disease, or unspecified chronic kidney disease: Secondary | ICD-10-CM | POA: Diagnosis not present

## 2019-09-16 DIAGNOSIS — N183 Chronic kidney disease, stage 3 unspecified: Secondary | ICD-10-CM | POA: Diagnosis not present

## 2019-09-16 DIAGNOSIS — I4891 Unspecified atrial fibrillation: Secondary | ICD-10-CM | POA: Diagnosis not present

## 2019-09-27 DIAGNOSIS — I4891 Unspecified atrial fibrillation: Secondary | ICD-10-CM | POA: Diagnosis not present

## 2019-09-27 DIAGNOSIS — M069 Rheumatoid arthritis, unspecified: Secondary | ICD-10-CM | POA: Diagnosis not present

## 2019-09-27 DIAGNOSIS — J45909 Unspecified asthma, uncomplicated: Secondary | ICD-10-CM | POA: Diagnosis not present

## 2019-09-27 DIAGNOSIS — Z20828 Contact with and (suspected) exposure to other viral communicable diseases: Secondary | ICD-10-CM | POA: Diagnosis not present

## 2019-09-27 DIAGNOSIS — D61818 Other pancytopenia: Secondary | ICD-10-CM | POA: Diagnosis not present

## 2019-09-27 DIAGNOSIS — N183 Chronic kidney disease, stage 3 unspecified: Secondary | ICD-10-CM | POA: Diagnosis not present

## 2019-09-27 DIAGNOSIS — I509 Heart failure, unspecified: Secondary | ICD-10-CM | POA: Diagnosis not present

## 2019-10-04 DIAGNOSIS — Z20828 Contact with and (suspected) exposure to other viral communicable diseases: Secondary | ICD-10-CM | POA: Diagnosis not present

## 2019-10-12 ENCOUNTER — Encounter: Payer: Medicare Other | Admitting: Internal Medicine

## 2019-10-13 ENCOUNTER — Other Ambulatory Visit: Payer: Self-pay

## 2019-10-13 ENCOUNTER — Encounter (HOSPITAL_COMMUNITY): Payer: Self-pay | Admitting: Emergency Medicine

## 2019-10-13 ENCOUNTER — Emergency Department (HOSPITAL_COMMUNITY)
Admission: EM | Admit: 2019-10-13 | Discharge: 2019-10-13 | Disposition: A | Discharge status: Home / Self Care | Payer: Medicare Other | Attending: Emergency Medicine | Admitting: Emergency Medicine

## 2019-10-13 DIAGNOSIS — J449 Chronic obstructive pulmonary disease, unspecified: Secondary | ICD-10-CM | POA: Diagnosis not present

## 2019-10-13 DIAGNOSIS — R52 Pain, unspecified: Secondary | ICD-10-CM | POA: Diagnosis not present

## 2019-10-13 DIAGNOSIS — Z7901 Long term (current) use of anticoagulants: Secondary | ICD-10-CM | POA: Insufficient documentation

## 2019-10-13 DIAGNOSIS — N183 Chronic kidney disease, stage 3 unspecified: Secondary | ICD-10-CM | POA: Diagnosis not present

## 2019-10-13 DIAGNOSIS — Z743 Need for continuous supervision: Secondary | ICD-10-CM | POA: Diagnosis not present

## 2019-10-13 DIAGNOSIS — I5030 Unspecified diastolic (congestive) heart failure: Secondary | ICD-10-CM | POA: Insufficient documentation

## 2019-10-13 DIAGNOSIS — Z952 Presence of prosthetic heart valve: Secondary | ICD-10-CM | POA: Diagnosis not present

## 2019-10-13 DIAGNOSIS — R1084 Generalized abdominal pain: Secondary | ICD-10-CM | POA: Diagnosis not present

## 2019-10-13 DIAGNOSIS — Z9049 Acquired absence of other specified parts of digestive tract: Secondary | ICD-10-CM | POA: Diagnosis not present

## 2019-10-13 DIAGNOSIS — R39198 Other difficulties with micturition: Secondary | ICD-10-CM | POA: Diagnosis not present

## 2019-10-13 DIAGNOSIS — R100 Acute abdomen: Secondary | ICD-10-CM | POA: Diagnosis not present

## 2019-10-13 DIAGNOSIS — I130 Hypertensive heart and chronic kidney disease with heart failure and stage 1 through stage 4 chronic kidney disease, or unspecified chronic kidney disease: Secondary | ICD-10-CM | POA: Insufficient documentation

## 2019-10-13 DIAGNOSIS — K5900 Constipation, unspecified: Secondary | ICD-10-CM | POA: Insufficient documentation

## 2019-10-13 DIAGNOSIS — Z79899 Other long term (current) drug therapy: Secondary | ICD-10-CM | POA: Diagnosis not present

## 2019-10-13 DIAGNOSIS — J45909 Unspecified asthma, uncomplicated: Secondary | ICD-10-CM | POA: Insufficient documentation

## 2019-10-13 DIAGNOSIS — R279 Unspecified lack of coordination: Secondary | ICD-10-CM | POA: Diagnosis not present

## 2019-10-13 DIAGNOSIS — Z950 Presence of cardiac pacemaker: Secondary | ICD-10-CM | POA: Insufficient documentation

## 2019-10-13 LAB — CBC WITH DIFFERENTIAL/PLATELET
Abs Immature Granulocytes: 0.02 10*3/uL (ref 0.00–0.07)
Basophils Absolute: 0 10*3/uL (ref 0.0–0.1)
Basophils Relative: 1 %
Eosinophils Absolute: 0.1 10*3/uL (ref 0.0–0.5)
Eosinophils Relative: 2 %
HCT: 32.7 % — ABNORMAL LOW (ref 36.0–46.0)
Hemoglobin: 10.1 g/dL — ABNORMAL LOW (ref 12.0–15.0)
Immature Granulocytes: 1 %
Lymphocytes Relative: 23 %
Lymphs Abs: 0.8 10*3/uL (ref 0.7–4.0)
MCH: 29.4 pg (ref 26.0–34.0)
MCHC: 30.9 g/dL (ref 30.0–36.0)
MCV: 95.3 fL (ref 80.0–100.0)
Monocytes Absolute: 1.1 10*3/uL — ABNORMAL HIGH (ref 0.1–1.0)
Monocytes Relative: 32 %
Neutro Abs: 1.4 10*3/uL — ABNORMAL LOW (ref 1.7–7.7)
Neutrophils Relative %: 41 %
Platelets: 121 10*3/uL — ABNORMAL LOW (ref 150–400)
RBC: 3.43 MIL/uL — ABNORMAL LOW (ref 3.87–5.11)
RDW: 14.8 % (ref 11.5–15.5)
WBC: 3.3 10*3/uL — ABNORMAL LOW (ref 4.0–10.5)
nRBC: 0 % (ref 0.0–0.2)

## 2019-10-13 LAB — BASIC METABOLIC PANEL
Anion gap: 8 (ref 5–15)
BUN: 15 mg/dL (ref 8–23)
CO2: 25 mmol/L (ref 22–32)
Calcium: 8.9 mg/dL (ref 8.9–10.3)
Chloride: 104 mmol/L (ref 98–111)
Creatinine, Ser: 0.81 mg/dL (ref 0.44–1.00)
GFR calc Af Amer: >60 mL/min (ref >60)
GFR calc non Af Amer: >60 mL/min (ref >60)
Glucose, Bld: 109 mg/dL — ABNORMAL HIGH (ref 70–99)
Potassium: 4.2 mmol/L (ref 3.5–5.1)
Sodium: 137 mmol/L (ref 135–145)

## 2019-10-13 MED ORDER — SORBITOL 70 % SOLN
960.0000 mL | TOPICAL_OIL | Freq: Once | ORAL | Status: AC
  Administered 2019-10-13: 960 mL via RECTAL
  Filled 2019-10-13 (×2): qty 473

## 2019-10-13 NOTE — ED Notes (Signed)
Pt ambulatory with personal cane, standby assistance.

## 2019-10-13 NOTE — ED Triage Notes (Signed)
BIB [facility] from [facility]. EMS reports constipation x 1 week and issues urinating. EMS palpated mass in upper left quad. Pt reported tenderness with palpation. No N/V. Hx of asthma, afib, CHF and pacemaker A&O x 4  122/60 131 CBG 96% RA 62 pulse 98 temp

## 2019-10-13 NOTE — Discharge Instructions (Addendum)
She was able to have a bowel movement after we gave her an enema.  She may continue to have some trouble with constipation requiring treatment with stool softeners.  To treat constipation, use MiraLAX or Colace, twice a day for 2 or 3 weeks to improve stooling.  Continue doing this until she has daily soft bowel movements.

## 2019-10-13 NOTE — ED Notes (Signed)
Pt had a successful BM after administration of SMOG enema.

## 2019-10-13 NOTE — ED Provider Notes (Signed)
[facility] DEPT Provider Note   CSN: [id number] Arrival date & time: 10/13/19  1102     History Chief Complaint  Patient presents with  . Constipation    Nancy Bush is a 86 y.o. female.  HPI She presents for evaluation of constipation, and inability to stool and urinate.  She states this been going on for about a week.  She denies vomiting.  She denies other illnesses.  She came here, by EMS, from her assisted living facility.  There are no other known modifying factors.    Past Medical History:  Diagnosis Date  . Asthma   . Atrial fibrillation ([place])   . CHF (congestive heart failure) ([place])   . COPD (chronic obstructive pulmonary disease) ([place])   . H/O: hysterectomy   . Hypertension   . LBBB (left bundle branch block)    per discharge note 2003  . Mitral regurgitation    MILD  . MVP (mitral valve prolapse)   . Pacemaker   . Pulmonary hypertension ([place])    MILD TO MODERATE BY ECHO  . PVC (premature ventricular contraction)   . Rheumatoid arthritis(714.0)   . S/P Maze operation for atrial fibrillation 05/18/2012   Complete biatrial lesion set using cryothermy with oversewing of LA appendage  . S/P mitral valve replacement with bioprosthetic valve 05/18/2012   109mm Edwards Magna Mitral pericardial bioprosthesis     Patient Active Problem List   Diagnosis Date Noted  . Educated about COVID-19 virus infection 03/20/2019  . Hypotension 03/18/2019  . S/P MVR (mitral valve repair) 03/18/2019  . Pancytopenia ([place]) 05/21/2018  . TGA (transient global amnesia) 05/19/2018  . Essential hypertension 12/11/2017  . Medication management 12/11/2017  . Pulmonary HTN ([place]) 12/30/2016  . Protein calorie malnutrition ([place]) 08/22/2012  . CAP (community acquired pneumonia) 08/21/2012  . Hypokalemia 08/21/2012  . SOB (shortness of breath) 08/20/2012  . FTT (failure to thrive) in adult 08/20/2012  . Leucocytosis 08/20/2012  . Atrial flutter  ([place]) 06/24/2012  . CKD (chronic kidney disease), stage III 06/24/2012  . Pleural effusion 06/24/2012  . [employer] 05/25/2012  . Bradycardia 05/21/2012  . S/P mitral valve replacement with bioprosthetic valve 05/18/2012  . S/P Maze operation for atrial fibrillation 05/18/2012  . Severe mitral regurgitation 05/17/2012  . A-fib ([place]) 05/17/2012  . MR (mitral regurgitation) 05/03/2012  . Fatigue 01/29/2012  . Acute on chronic diastolic heart failure ([place]) 06/10/2011  . CKD (chronic kidney disease) 06/03/2011  . Encounter for long-term (current) use of anticoagulants 05/26/2011  . Atrial fibrillation ([place]) 05/19/2011  . CHEST PAIN UNSPECIFIED 08/28/2010  . ABNORMAL ELECTROCARDIOGRAM 11/14/2009  . Mitral Regurgitation 11/12/2009  . PULMONARY HYPERTENSION 11/12/2009  . Mitral valve disorder 11/12/2009  . SINUSITIS, CHRONIC 06/12/2007  . ALLERGIC RHINITIS 06/12/2007  . BRONCHITIS 06/12/2007  . COPD 06/12/2007    Past Surgical History:  Procedure Laterality Date  . ABDOMINAL HYSTERECTOMY    . APPENDECTOMY    . CATARACT EXTRACTION    . CHOLECYSTECTOMY    . hysterectomy    . MAZE  05/18/2012   Procedure: MAZE;  Surgeon: Rexene Alberts, MD;  Location: [facility];  Service: Open Heart Surgery;  Laterality: Right;  . MITRAL VALVE REPLACEMENT  05/18/2012   Procedure: MINIMALLY INVASIVE MITRAL VALVE (MV) REPLACEMENT;  Surgeon: Rexene Alberts, MD;  Location: [facility];  Service: Open Heart Surgery;  Laterality: N/A;  . OVARIAN CYST REMOVAL    . PACEMAKER INSERTION    . PERMANENT  PACEMAKER INSERTION N/A 05/21/2012   Procedure: PERMANENT PACEMAKER INSERTION;  Surgeon: Evans Lance, MD;  Location: [facility] CATH LAB;  Service: Cardiovascular;  Laterality: N/A;  . TEE WITHOUT CARDIOVERSION  02/09/2012   Procedure: TRANSESOPHAGEAL ECHOCARDIOGRAM (TEE);  Surgeon: Fay Records, MD;  Location: [facility] ENDOSCOPY;  Service: Cardiovascular;  Laterality: N/A;     OB History   No obstetric history on  file.     History reviewed. No pertinent family history.  Social History   Tobacco Use  . Smoking status: Never Smoker  . Smokeless tobacco: Never Used  Substance Use Topics  . Alcohol use: No  . Drug use: No    Home Medications Prior to Admission medications   Medication Sig Start Date End Date Taking? Authorizing Provider  albuterol (VENTOLIN HFA) 108 (90 Base) MCG/ACT inhaler Inhale 2 puffs into the lungs every 8 (eight) hours as needed for wheezing or shortness of breath. 10/11/19  Yes [provider]  apixaban (ELIQUIS) 2.5 MG TABS tablet Take 1 tablet (2.5 mg total) by mouth 2 (two) times daily. 08/30/19  Yes Minus Breeding, MD  bisacodyl (DULCOLAX) 10 MG suppository Place 10 mg rectally daily.   Yes [provider]  Calcium Polycarbophil (FIBERCON PO) Take 1 packet by mouth 2 (two) times daily.   Yes [provider]  furosemide (LASIX) 20 MG tablet Take 20 mg by mouth daily.    Yes [provider]  metoprolol succinate (TOPROL-XL) 25 MG 24 hr tablet Take 0.5 tablets (12.5 mg total) by mouth daily. Patient taking differently: Take 25 mg by mouth daily.  08/31/19  Yes Cleaver, Jossie Ng, NP  montelukast (SINGULAIR) 10 MG tablet Take 10 mg by mouth at bedtime.  12/31/12  Yes [provider]  Multiple Vitamins-Minerals (PRESERVISION AREDS PO) Take 1 tablet by mouth 2 (two) times daily. Preservision Areds    Yes [provider]  nitroGLYCERIN (NITROSTAT) 0.4 MG SL tablet Place 0.4 mg under the tongue every 5 (five) minutes as needed for chest pain (x 3 tabs daily).   Yes [provider]  predniSONE (DELTASONE) 5 MG tablet Take 2.5 mg by mouth every other day.   Yes [provider]    Allergies    Promethazine hcl, Septra [sulfamethoxazole-trimethoprim], Sulfonamide derivatives, and Tequin  Review of Systems   Review of Systems  All other systems reviewed and are negative.   Physical Exam Updated Vital Signs  BP (!) 154/52   Pulse 60   Temp (!) 97.4 F (36.3 C) (Oral)   Resp 17   SpO2 96%   Physical Exam Vitals and nursing note reviewed.  Constitutional:      General: She is not in acute distress.    Appearance: She is well-developed. She is not ill-appearing, toxic-appearing or diaphoretic.  HENT:     Head: Normocephalic and atraumatic.     Right Ear: External ear normal.     Left Ear: External ear normal.  Eyes:     Conjunctiva/sclera: Conjunctivae normal.     Pupils: Pupils are equal, round, and reactive to light.  Neck:     Trachea: Phonation normal.  Cardiovascular:     Rate and Rhythm: Normal rate and regular rhythm.     Heart sounds: Normal heart sounds.  Pulmonary:     Effort: Pulmonary effort is normal.     Breath sounds: Normal breath sounds.  Abdominal:     General: There is no distension.     Palpations: Abdomen is soft.  Tenderness: There is no abdominal tenderness.  Genitourinary:    Comments: Normal anus.  Small amount of soft brown stool in rectal vault.  No fecal impaction.  No palpable rectal mass. Musculoskeletal:        General: Normal range of motion.     Cervical back: Normal range of motion and neck supple.  Skin:    General: Skin is warm and dry.  Neurological:     Mental Status: She is alert and oriented to person, place, and time.     Cranial Nerves: No cranial nerve deficit.     Sensory: No sensory deficit.     Motor: No abnormal muscle tone.     Coordination: Coordination normal.  Psychiatric:        Mood and Affect: Mood normal.        Behavior: Behavior normal.        Thought Content: Thought content normal.        Judgment: Judgment normal.     ED Results / Procedures / Treatments   Labs (all labs ordered are listed, but only abnormal results are displayed) Labs Reviewed  BASIC METABOLIC PANEL - Abnormal; Notable for the following components:      Result Value   Glucose, Bld 109 (*)    All other components within normal limits   CBC WITH DIFFERENTIAL/PLATELET - Abnormal; Notable for the following components:   WBC 3.3 (*)    RBC 3.43 (*)    Hemoglobin 10.1 (*)    HCT 32.7 (*)    Platelets 121 (*)    Neutro Abs 1.4 (*)    Monocytes Absolute 1.1 (*)    All other components within normal limits    EKG None  Radiology No results found.  Procedures Procedures (including critical care time)  Medications Ordered in ED Medications  sorbitol, milk of mag, mineral oil, glycerin (SMOG) enema (960 mLs Rectal Given 10/13/19 1356)    ED Course  I have reviewed the triage vital signs and the nursing notes.  Pertinent labs & imaging results that were available during my care of the patient were reviewed by me and considered in my medical decision making (see chart for details).  Clinical Course as of Oct 13 1411  Thu Oct 13, 2019  1235 Normal except glucose high  Basic metabolic panel(!) [EW]  99991111 Normal except white count low, hemoglobin low, platelets low  CBC with Differential(!) [EW]    Clinical Course User Index [EW] Daleen Bo, MD   MDM Rules/Calculators/A&P                       Patient Vitals for the past 24 hrs:  BP Temp Temp src Pulse Resp SpO2  10/13/19 1345 (!) 154/52 - - 60 17 96 %  10/13/19 1230 (!) 120/59 - - (!) 58 10 96 %  10/13/19 1130 (!) 120/53 - - 60 17 96 %  10/13/19 1114 132/66 (!) 97.4 F (36.3 C) Oral 62 16 97 %    2:10 PM Reevaluation with update and discussion. After initial assessment and treatment, an updated evaluation reveals she had a bowel movement after enema was given.  She remains fairly comfortable.  Findings discussed with the patient. Daleen Bo   Medical Decision Making: Constipation, nonspecific, without urinary bladder outlet obstruction.  Bladder scan had 45 cc of urine.  Reassuring evaluation otherwise, she is stable for discharge.  Cruzita Overweg Huertas was evaluated in Emergency Department on 10/13/2019 for the  symptoms described in the history of  present illness. She was evaluated in the context of the global COVID-19 pandemic, which necessitated consideration that the patient might be at risk for infection with the SARS-CoV-2 virus that causes COVID-19. Institutional protocols and algorithms that pertain to the evaluation of patients at risk for COVID-19 are in a state of rapid change based on information released by regulatory bodies including the CDC and federal and state organizations. These policies and algorithms were followed during the patient's care in the ED.   CRITICAL CARE- no Performed by: Daleen Bo   Nursing Notes Reviewed/ Care Coordinated Applicable Imaging Reviewed Interpretation of Laboratory Data incorporated into ED treatment  The patient appears reasonably screened and/or stabilized for discharge and I doubt any other medical condition or other [facility] requiring further screening, evaluation, or treatment in the ED at this time prior to discharge.  Plan: Home Medications-stool softener of choice, MiraLAX or Colace, continue usual medications; Home Treatments-increase fluid and fiber in diet; return here if the recommended treatment, does not improve the symptoms; Recommended follow up-PCP follow-up 1 week and as needed    Final Clinical Impression(s) / ED Diagnoses Final diagnoses:  Constipation, unspecified constipation type    Rx / DC Orders ED Discharge Orders    None       Daleen Bo, MD 10/13/19 1413

## 2019-10-13 NOTE — ED Notes (Signed)
Patient given [place] sandwich and cranberry juice.

## 2019-10-13 NOTE — ED Notes (Signed)
Attempted to call report to [facility] assisted [facility]. No answer.

## 2019-10-13 NOTE — ED Notes (Signed)
[facility] called for transport.  

## 2019-10-14 ENCOUNTER — Encounter: Payer: Medicare Other | Admitting: Physician Assistant

## 2019-10-18 ENCOUNTER — Ambulatory Visit (INDEPENDENT_AMBULATORY_CARE_PROVIDER_SITE_OTHER): Payer: Medicare Other | Admitting: *Deleted

## 2019-10-18 DIAGNOSIS — I480 Paroxysmal atrial fibrillation: Secondary | ICD-10-CM | POA: Diagnosis not present

## 2019-10-18 LAB — CUP PACEART REMOTE DEVICE CHECK
Battery Remaining Longevity: 60 mo
Battery Remaining Percentage: 65 %
Brady Statistic RA Percent Paced: 75 %
Brady Statistic RV Percent Paced: 98 %
Date Time Interrogation Session: 20210316021100
Implantable Lead Implant Date: 20131018
Implantable Lead Implant Date: 20131018
Implantable Lead Location: 753859
Implantable Lead Location: 753860
Implantable Lead Model: 4135
Implantable Lead Model: 4136
Implantable Lead Serial Number: 29232066
Implantable Lead Serial Number: 29233089
Implantable Pulse Generator Implant Date: 20131018
Lead Channel Impedance Value: 471 Ohm
Lead Channel Impedance Value: 573 Ohm
Lead Channel Pacing Threshold Amplitude: 0.5 V
Lead Channel Pacing Threshold Amplitude: 0.7 V
Lead Channel Pacing Threshold Pulse Width: 0.4 ms
Lead Channel Pacing Threshold Pulse Width: 0.4 ms
Lead Channel Setting Pacing Amplitude: 1.2 V
Lead Channel Setting Pacing Amplitude: 2 V
Lead Channel Setting Pacing Pulse Width: 0.4 ms
Lead Channel Setting Sensing Sensitivity: 2.5 mV
Pulse Gen Serial Number: 111608

## 2019-10-19 NOTE — Progress Notes (Signed)
PPM Remote  

## 2019-10-20 DIAGNOSIS — R609 Edema, unspecified: Secondary | ICD-10-CM | POA: Diagnosis not present

## 2019-10-20 DIAGNOSIS — R109 Unspecified abdominal pain: Secondary | ICD-10-CM | POA: Diagnosis not present

## 2019-10-20 DIAGNOSIS — K5900 Constipation, unspecified: Secondary | ICD-10-CM | POA: Diagnosis not present

## 2019-10-20 DIAGNOSIS — R140 Abdominal distension (gaseous): Secondary | ICD-10-CM | POA: Diagnosis not present

## 2019-10-26 ENCOUNTER — Inpatient Hospital Stay (HOSPITAL_COMMUNITY)
Admission: EM | Admit: 2019-10-26 | Discharge: 2019-10-29 | DRG: 291 | Disposition: A | Discharge status: Home / Self Care | Payer: Medicare Other | Attending: Internal Medicine | Admitting: Internal Medicine

## 2019-10-26 ENCOUNTER — Encounter (HOSPITAL_COMMUNITY): Payer: Self-pay

## 2019-10-26 ENCOUNTER — Emergency Department (HOSPITAL_COMMUNITY): Payer: Medicare Other

## 2019-10-26 ENCOUNTER — Other Ambulatory Visit: Payer: Self-pay

## 2019-10-26 ENCOUNTER — Encounter: Payer: Medicare Other | Admitting: Physician Assistant

## 2019-10-26 DIAGNOSIS — H9190 Unspecified hearing loss, unspecified ear: Secondary | ICD-10-CM | POA: Diagnosis present

## 2019-10-26 DIAGNOSIS — D61818 Other pancytopenia: Secondary | ICD-10-CM | POA: Diagnosis present

## 2019-10-26 DIAGNOSIS — Z7189 Other specified counseling: Secondary | ICD-10-CM | POA: Diagnosis not present

## 2019-10-26 DIAGNOSIS — Z882 Allergy status to sulfonamides status: Secondary | ICD-10-CM

## 2019-10-26 DIAGNOSIS — Z950 Presence of cardiac pacemaker: Secondary | ICD-10-CM | POA: Diagnosis not present

## 2019-10-26 DIAGNOSIS — I480 Paroxysmal atrial fibrillation: Secondary | ICD-10-CM | POA: Diagnosis present

## 2019-10-26 DIAGNOSIS — Z888 Allergy status to other drugs, medicaments and biological substances status: Secondary | ICD-10-CM | POA: Diagnosis not present

## 2019-10-26 DIAGNOSIS — I447 Left bundle-branch block, unspecified: Secondary | ICD-10-CM | POA: Diagnosis present

## 2019-10-26 DIAGNOSIS — Z79899 Other long term (current) drug therapy: Secondary | ICD-10-CM

## 2019-10-26 DIAGNOSIS — Z20822 Contact with and (suspected) exposure to covid-19: Secondary | ICD-10-CM | POA: Diagnosis present

## 2019-10-26 DIAGNOSIS — R001 Bradycardia, unspecified: Secondary | ICD-10-CM | POA: Diagnosis present

## 2019-10-26 DIAGNOSIS — N183 Chronic kidney disease, stage 3 unspecified: Secondary | ICD-10-CM | POA: Diagnosis not present

## 2019-10-26 DIAGNOSIS — Z7401 Bed confinement status: Secondary | ICD-10-CM | POA: Diagnosis not present

## 2019-10-26 DIAGNOSIS — I50813 Acute on chronic right heart failure: Secondary | ICD-10-CM | POA: Diagnosis not present

## 2019-10-26 DIAGNOSIS — I5031 Acute diastolic (congestive) heart failure: Secondary | ICD-10-CM | POA: Diagnosis not present

## 2019-10-26 DIAGNOSIS — I080 Rheumatic disorders of both mitral and aortic valves: Secondary | ICD-10-CM | POA: Diagnosis present

## 2019-10-26 DIAGNOSIS — R0600 Dyspnea, unspecified: Secondary | ICD-10-CM | POA: Diagnosis not present

## 2019-10-26 DIAGNOSIS — Z953 Presence of xenogenic heart valve: Secondary | ICD-10-CM

## 2019-10-26 DIAGNOSIS — I272 Pulmonary hypertension, unspecified: Secondary | ICD-10-CM | POA: Diagnosis present

## 2019-10-26 DIAGNOSIS — I071 Rheumatic tricuspid insufficiency: Secondary | ICD-10-CM | POA: Diagnosis present

## 2019-10-26 DIAGNOSIS — D631 Anemia in chronic kidney disease: Secondary | ICD-10-CM | POA: Diagnosis present

## 2019-10-26 DIAGNOSIS — Z515 Encounter for palliative care: Secondary | ICD-10-CM | POA: Diagnosis not present

## 2019-10-26 DIAGNOSIS — I130 Hypertensive heart and chronic kidney disease with heart failure and stage 1 through stage 4 chronic kidney disease, or unspecified chronic kidney disease: Secondary | ICD-10-CM | POA: Diagnosis present

## 2019-10-26 DIAGNOSIS — I509 Heart failure, unspecified: Secondary | ICD-10-CM

## 2019-10-26 DIAGNOSIS — J449 Chronic obstructive pulmonary disease, unspecified: Secondary | ICD-10-CM | POA: Diagnosis present

## 2019-10-26 DIAGNOSIS — M069 Rheumatoid arthritis, unspecified: Secondary | ICD-10-CM | POA: Diagnosis present

## 2019-10-26 DIAGNOSIS — E46 Unspecified protein-calorie malnutrition: Secondary | ICD-10-CM | POA: Diagnosis not present

## 2019-10-26 DIAGNOSIS — J90 Pleural effusion, not elsewhere classified: Secondary | ICD-10-CM

## 2019-10-26 DIAGNOSIS — Z7901 Long term (current) use of anticoagulants: Secondary | ICD-10-CM | POA: Diagnosis not present

## 2019-10-26 DIAGNOSIS — R52 Pain, unspecified: Secondary | ICD-10-CM | POA: Diagnosis not present

## 2019-10-26 DIAGNOSIS — I499 Cardiac arrhythmia, unspecified: Secondary | ICD-10-CM | POA: Diagnosis not present

## 2019-10-26 DIAGNOSIS — R0609 Other forms of dyspnea: Secondary | ICD-10-CM

## 2019-10-26 DIAGNOSIS — I10 Essential (primary) hypertension: Secondary | ICD-10-CM | POA: Diagnosis not present

## 2019-10-26 DIAGNOSIS — R0902 Hypoxemia: Secondary | ICD-10-CM | POA: Diagnosis present

## 2019-10-26 DIAGNOSIS — Z66 Do not resuscitate: Secondary | ICD-10-CM | POA: Diagnosis present

## 2019-10-26 DIAGNOSIS — R609 Edema, unspecified: Secondary | ICD-10-CM | POA: Diagnosis not present

## 2019-10-26 DIAGNOSIS — I110 Hypertensive heart disease with heart failure: Secondary | ICD-10-CM | POA: Diagnosis not present

## 2019-10-26 DIAGNOSIS — I4891 Unspecified atrial fibrillation: Secondary | ICD-10-CM | POA: Diagnosis present

## 2019-10-26 DIAGNOSIS — I5033 Acute on chronic diastolic (congestive) heart failure: Secondary | ICD-10-CM | POA: Diagnosis not present

## 2019-10-26 DIAGNOSIS — R0602 Shortness of breath: Secondary | ICD-10-CM | POA: Diagnosis not present

## 2019-10-26 DIAGNOSIS — M2550 Pain in unspecified joint: Secondary | ICD-10-CM | POA: Diagnosis not present

## 2019-10-26 DIAGNOSIS — R062 Wheezing: Secondary | ICD-10-CM | POA: Diagnosis not present

## 2019-10-26 LAB — BASIC METABOLIC PANEL
Anion gap: 12 (ref 5–15)
BUN: 15 mg/dL (ref 8–23)
CO2: 26 mmol/L (ref 22–32)
Calcium: 8.6 mg/dL — ABNORMAL LOW (ref 8.9–10.3)
Chloride: 97 mmol/L — ABNORMAL LOW (ref 98–111)
Creatinine, Ser: 0.88 mg/dL (ref 0.44–1.00)
GFR calc Af Amer: >60 mL/min (ref >60)
GFR calc non Af Amer: 57 mL/min — ABNORMAL LOW (ref >60)
Glucose, Bld: 123 mg/dL — ABNORMAL HIGH (ref 70–99)
Potassium: 4.2 mmol/L (ref 3.5–5.1)
Sodium: 135 mmol/L (ref 135–145)

## 2019-10-26 LAB — CBC
HCT: 30.6 % — ABNORMAL LOW (ref 36.0–46.0)
Hemoglobin: 9.1 g/dL — ABNORMAL LOW (ref 12.0–15.0)
MCH: 28.1 pg (ref 26.0–34.0)
MCHC: 29.7 g/dL — ABNORMAL LOW (ref 30.0–36.0)
MCV: 94.4 fL (ref 80.0–100.0)
Platelets: 97 10*3/uL — ABNORMAL LOW (ref 150–400)
RBC: 3.24 MIL/uL — ABNORMAL LOW (ref 3.87–5.11)
RDW: 14.5 % (ref 11.5–15.5)
WBC: 3 10*3/uL — ABNORMAL LOW (ref 4.0–10.5)
nRBC: 0 % (ref 0.0–0.2)

## 2019-10-26 LAB — BRAIN NATRIURETIC PEPTIDE: B Natriuretic Peptide: 632.3 pg/mL — ABNORMAL HIGH (ref 0.0–100.0)

## 2019-10-26 LAB — TROPONIN I (HIGH SENSITIVITY): Troponin I (High Sensitivity): 7 ng/L (ref <18)

## 2019-10-26 MED ORDER — FUROSEMIDE 10 MG/ML IJ SOLN
40.0000 mg | Freq: Two times a day (BID) | INTRAMUSCULAR | Status: DC
  Administered 2019-10-27 – 2019-10-29 (×5): 40 mg via INTRAVENOUS
  Filled 2019-10-26 (×5): qty 4

## 2019-10-26 MED ORDER — METOPROLOL SUCCINATE ER 25 MG PO TB24
12.5000 mg | ORAL_TABLET | Freq: Every day | ORAL | Status: DC
  Administered 2019-10-27 – 2019-10-29 (×3): 12.5 mg via ORAL
  Filled 2019-10-26 (×3): qty 1

## 2019-10-26 MED ORDER — FUROSEMIDE 10 MG/ML IJ SOLN
40.0000 mg | Freq: Once | INTRAMUSCULAR | Status: AC
  Administered 2019-10-26: 40 mg via INTRAVENOUS
  Filled 2019-10-26: qty 4

## 2019-10-26 MED ORDER — MONTELUKAST SODIUM 10 MG PO TABS
10.0000 mg | ORAL_TABLET | Freq: Every day | ORAL | Status: DC
  Administered 2019-10-27 – 2019-10-29 (×3): 10 mg via ORAL
  Filled 2019-10-26 (×3): qty 1

## 2019-10-26 MED ORDER — POLYETHYLENE GLYCOL 3350 17 G PO PACK
17.0000 g | PACK | Freq: Every day | ORAL | Status: DC
  Administered 2019-10-27 – 2019-10-28 (×2): 17 g via ORAL
  Filled 2019-10-26 (×3): qty 1

## 2019-10-26 MED ORDER — APIXABAN 2.5 MG PO TABS
2.5000 mg | ORAL_TABLET | Freq: Two times a day (BID) | ORAL | Status: DC
  Administered 2019-10-27 – 2019-10-29 (×6): 2.5 mg via ORAL
  Filled 2019-10-26 (×7): qty 1

## 2019-10-26 MED ORDER — NITROGLYCERIN 0.4 MG SL SUBL: 0.4000 mg | SUBLINGUAL_TABLET | SUBLINGUAL | Status: DC | PRN

## 2019-10-26 MED ORDER — ALBUTEROL SULFATE (2.5 MG/3ML) 0.083% IN NEBU: 3.0000 mL | INHALATION_SOLUTION | Freq: Three times a day (TID) | RESPIRATORY_TRACT | Status: DC | PRN

## 2019-10-26 MED ORDER — SENNOSIDES-DOCUSATE SODIUM 8.6-50 MG PO TABS: 1.0000 | ORAL_TABLET | Freq: Every evening | ORAL | Status: DC | PRN

## 2019-10-26 MED ORDER — ACETAMINOPHEN 650 MG RE SUPP: 650.0000 mg | Freq: Four times a day (QID) | RECTAL | Status: DC | PRN

## 2019-10-26 MED ORDER — ACETAMINOPHEN 325 MG PO TABS: 650.0000 mg | ORAL_TABLET | Freq: Four times a day (QID) | ORAL | Status: DC | PRN

## 2019-10-26 MED ORDER — ONDANSETRON HCL 4 MG PO TABS: 4.0000 mg | ORAL_TABLET | Freq: Four times a day (QID) | ORAL | Status: DC | PRN

## 2019-10-26 MED ORDER — ONDANSETRON HCL 4 MG/2ML IJ SOLN: 4.0000 mg | Freq: Four times a day (QID) | INTRAMUSCULAR | Status: DC | PRN

## 2019-10-26 MED ORDER — PREDNISONE 5 MG PO TABS
2.5000 mg | ORAL_TABLET | ORAL | Status: DC
  Administered 2019-10-27 – 2019-10-29 (×2): 2.5 mg via ORAL
  Filled 2019-10-26 (×2): qty 1

## 2019-10-26 NOTE — ED Notes (Signed)
AMBULATING PATIENT O2 STATS FELL TO 86% SHE COMPLAINED OF SHORTNESS OF BREATH

## 2019-10-26 NOTE — ED Provider Notes (Signed)
[facility] DEPT Provider Note   CSN: [id number] Arrival date & time: 10/26/19  1322     History CC:  Leg swelling  Nancy Bush is a 86 y.o. female w/ hx of CHF w/ severe TR on Jan 2021 echo, COPD, LBBB s/p pacemaker, MVP on eliquis, presenting to the ED with leg swelling.  Patient reporting her bilateral legs have swollen up.  She cannot provide much more further history.  However discussing with the nurse at her assisted care facility, the patient has been complaining of shortness of breath with exertion for the past several days.  Giving her PRN lasix and elevating legs, but no improvement.  Seen by her cardiology office in Jan 2021, had echo at that time showing severe TR and also MVP.  Noted to have parox a fib as well.  At that time she was ambulatory with 98% O2 sat noted in [facility] note.  Her TR likely to be managed clinically given her age.   HPI     Past Medical History:  Diagnosis Date  . Asthma   . Atrial fibrillation ([place])   . CHF (congestive heart failure) ([place])   . COPD (chronic obstructive pulmonary disease) ([place])   . H/O: hysterectomy   . Hypertension   . LBBB (left bundle branch block)    per discharge note 2003  . Mitral regurgitation    MILD  . MVP (mitral valve prolapse)   . Pacemaker   . Pulmonary hypertension ([place])    MILD TO MODERATE BY ECHO  . PVC (premature ventricular contraction)   . Rheumatoid arthritis(714.0)   . S/P Maze operation for atrial fibrillation 05/18/2012   Complete biatrial lesion set using cryothermy with oversewing of LA appendage  . S/P mitral valve replacement with bioprosthetic valve 05/18/2012   73mm Edwards Magna Mitral pericardial bioprosthesis     Patient Active Problem List   Diagnosis Date Noted  . CHF exacerbation ([place]) 10/26/2019  . Educated about COVID-19 virus infection 03/20/2019  . Hypotension 03/18/2019  . S/P MVR (mitral valve repair) 03/18/2019  .  Pancytopenia ([place]) 05/21/2018  . TGA (transient global amnesia) 05/19/2018  . Essential hypertension 12/11/2017  . Medication management 12/11/2017  . Pulmonary HTN ([place]) 12/30/2016  . Protein calorie malnutrition ([place]) 08/22/2012  . CAP (community acquired pneumonia) 08/21/2012  . Hypokalemia 08/21/2012  . SOB (shortness of breath) 08/20/2012  . FTT (failure to thrive) in adult 08/20/2012  . Leucocytosis 08/20/2012  . Atrial flutter ([place]) 06/24/2012  . CKD (chronic kidney disease), stage III 06/24/2012  . Pleural effusion 06/24/2012  . [employer] 05/25/2012  . Bradycardia 05/21/2012  . S/P mitral valve replacement with bioprosthetic valve 05/18/2012  . S/P Maze operation for atrial fibrillation 05/18/2012  . Severe mitral regurgitation 05/17/2012  . A-fib ([place]) 05/17/2012  . MR (mitral regurgitation) 05/03/2012  . Fatigue 01/29/2012  . Acute on chronic diastolic heart failure ([place]) 06/10/2011  . CKD (chronic kidney disease) 06/03/2011  . Encounter for long-term (current) use of anticoagulants 05/26/2011  . Atrial fibrillation ([place]) 05/19/2011  . CHEST PAIN UNSPECIFIED 08/28/2010  . ABNORMAL ELECTROCARDIOGRAM 11/14/2009  . Mitral Regurgitation 11/12/2009  . PULMONARY HYPERTENSION 11/12/2009  . Mitral valve disorder 11/12/2009  . SINUSITIS, CHRONIC 06/12/2007  . ALLERGIC RHINITIS 06/12/2007  . BRONCHITIS 06/12/2007  . COPD 06/12/2007    Past Surgical History:  Procedure Laterality Date  . ABDOMINAL HYSTERECTOMY    . APPENDECTOMY    . CATARACT EXTRACTION    .  CHOLECYSTECTOMY    . hysterectomy    . MAZE  05/18/2012   Procedure: MAZE;  Surgeon: Rexene Alberts, MD;  Location: [facility];  Service: Open Heart Surgery;  Laterality: Right;  . MITRAL VALVE REPLACEMENT  05/18/2012   Procedure: MINIMALLY INVASIVE MITRAL VALVE (MV) REPLACEMENT;  Surgeon: Rexene Alberts, MD;  Location: [facility];  Service: Open Heart Surgery;  Laterality: N/A;  . OVARIAN CYST REMOVAL      . PACEMAKER INSERTION    . PERMANENT PACEMAKER INSERTION N/A 05/21/2012   Procedure: PERMANENT PACEMAKER INSERTION;  Surgeon: Evans Lance, MD;  Location: [facility] CATH LAB;  Service: Cardiovascular;  Laterality: N/A;  . TEE WITHOUT CARDIOVERSION  02/09/2012   Procedure: TRANSESOPHAGEAL ECHOCARDIOGRAM (TEE);  Surgeon: Fay Records, MD;  Location[facility] ENDOSCOPY;  Service: Cardiovascular;  Laterality: N/A;     OB History   No obstetric history on file.     No family history on file.  Social History   Tobacco Use  . Smoking status: Never Smoker  . Smokeless tobacco: Never Used  Substance Use Topics  . Alcohol use: No  . Drug use: No    Home Medications Prior to Admission medications   Medication Sig Start Date End Date Taking? Authorizing Provider  albuterol (VENTOLIN HFA) 108 (90 Base) MCG/ACT inhaler Inhale 2 puffs into the lungs every 8 (eight) hours as needed for wheezing or shortness of breath. 10/11/19  Yes [provider]  apixaban (ELIQUIS) 2.5 MG TABS tablet Take 1 tablet (2.5 mg total) by mouth 2 (two) times daily. 08/30/19  Yes Minus Breeding, MD  Calcium Polycarbophil (FIBERCON PO) Take 1 packet by mouth 2 (two) times daily.   Yes [provider]  furosemide (LASIX) 20 MG tablet Take 20 mg by mouth See admin instructions. 20mg  daily An extra 20mg  daily as needed for swelling   Yes [provider]  metoprolol succinate (TOPROL-XL) 25 MG 24 hr tablet Take 12.5 mg by mouth daily.   Yes [provider]  montelukast (SINGULAIR) 10 MG tablet Take 10 mg by mouth daily.  12/31/12  Yes [provider]  Multiple Vitamins-Minerals (PRESERVISION AREDS PO) Take 1 tablet by mouth 2 (two) times daily. Preservision Areds    Yes [provider]  nitroGLYCERIN (NITROSTAT) 0.4 MG SL tablet Place 0.4 mg under the tongue every 5 (five) minutes as needed for chest pain (x 3 tabs daily).   Yes [provider]  polycarbophil (FIBERCON) 625  MG tablet Take 312.5 mg by mouth daily.   Yes [provider]  polyethylene glycol (MIRALAX / GLYCOLAX) 17 g packet Take 34 g by mouth daily.   Yes [provider]  predniSONE (DELTASONE) 5 MG tablet Take 2.5 mg by mouth every other day.   Yes [provider]  metoprolol succinate (TOPROL-XL) 25 MG 24 hr tablet Take 0.5 tablets (12.5 mg total) by mouth daily. Patient not taking: Reported on 10/26/2019 08/31/19   Deberah Pelton, NP    Allergies    Promethazine hcl, Septra [sulfamethoxazole-trimethoprim], Sulfonamide derivatives, and Tequin  Review of Systems   Review of Systems  Constitutional: Negative for chills and fever.  Respiratory: Negative for cough and shortness of breath.   Cardiovascular: Positive for leg swelling. Negative for chest pain and palpitations.  Gastrointestinal: Negative for abdominal pain and vomiting.  Musculoskeletal: Positive for gait problem. Negative for back pain.  Neurological: Negative for syncope and headaches.  All other systems reviewed and are negative.  Physical Exam Updated Vital Signs BP 137/68   Pulse (!) 59   Temp 98.8 F (37.1 C) (Oral)   Resp 12   Ht 4\' 8"  (1.422 m)   Wt 47.2 kg   SpO2 100%   BMI 23.32 kg/m   Physical Exam Vitals and nursing note reviewed.  Constitutional:      General: She is not in acute distress.    Appearance: She is well-developed.  HENT:     Head: Normocephalic and atraumatic.  Eyes:     Conjunctiva/sclera: Conjunctivae normal.  Cardiovascular:     Rate and Rhythm: Normal rate and regular rhythm.     Heart sounds: Murmur present.  Pulmonary:     Effort: No respiratory distress.     Comments: 95% sitting at rest, desaturation to 86% with ambulation, patient SOB with ambulation Diminished breath sounds in left lower lung field Abdominal:     Palpations: Abdomen is soft.     Tenderness: There is no abdominal tenderness.  Musculoskeletal:     Cervical back: Neck supple.      Comments: Symmetrical pitting edema of bilateral legs to below the knee  Skin:    General: Skin is warm and dry.  Neurological:     General: No focal deficit present.     Mental Status: She is alert and oriented to person, place, and time.     ED Results / Procedures / Treatments   Labs (all labs ordered are listed, but only abnormal results are displayed) Labs Reviewed  BRAIN NATRIURETIC PEPTIDE - Abnormal; Notable for the following components:      Result Value   B Natriuretic Peptide 632.3 (*)    All other components within normal limits  BASIC METABOLIC PANEL - Abnormal; Notable for the following components:   Chloride 97 (*)    Glucose, Bld 123 (*)    Calcium 8.6 (*)    GFR calc non Af Amer 57 (*)    All other components within normal limits  CBC - Abnormal; Notable for the following components:   WBC 3.0 (*)    RBC 3.24 (*)    Hemoglobin 9.1 (*)    HCT 30.6 (*)    MCHC 29.7 (*)    Platelets 97 (*)    All other components within normal limits  SARS CORONAVIRUS 2 (TAT 6-24 HRS)  COMPREHENSIVE METABOLIC PANEL  CBC  MAGNESIUM  TROPONIN I (HIGH SENSITIVITY)    EKG EKG Interpretation  Date/Time:  Wednesday October 26 2019 15:18:52 EDT Ventricular Rate:  60 PR Interval:    QRS Duration: 142 QT Interval:  504 QTC Calculation: 504 R Axis:   -89 Text Interpretation: Atrial-paced rhythm Nonspecific IVCD with LAD LVH with secondary repolarization abnormality Anterolateral infarct, age indeterminate No STEMI Confirmed by Octaviano Glow [phone]) on 10/26/2019 3:24:19 PM   Radiology DG Chest Portable 1 View  Result Date: 10/26/2019 CLINICAL DATA:  Leg swelling increasing for a few days. Also with leg pain. EXAM: PORTABLE CHEST 1 VIEW COMPARISON:  08/26/2019 FINDINGS: Stable changes from prior cardiac surgery and valve replacement cardiac silhouette is mildly enlarged. No mediastinal or hilar masses. Moderate left pleural effusion obscures portions of the left heart border  and the left hemidiaphragm. Possible small right pleural effusion. Is mild opacity at the left lung base adjacent to the pleural effusion consistent with atelectasis. Remainder of the lungs is clear. No pneumothorax. Left anterior chest wall dual lead pacemaker is stable. Skeletal structures are grossly intact. IMPRESSION: 1. Moderate left  pleural effusion which has increased compared to the prior exam. Mild associated left lung base atelectasis. 2. No evidence of pneumonia and no pulmonary edema. Electronically Signed   By: Lajean Manes M.D.   On: 10/26/2019 14:44    Procedures Procedures (including critical care time)  Medications Ordered in ED Medications  montelukast (SINGULAIR) tablet 10 mg (has no administration in time range)  albuterol (PROVENTIL) (2.5 MG/3ML) 0.083% nebulizer solution 3 mL (has no administration in time range)  apixaban (ELIQUIS) tablet 2.5 mg (has no administration in time range)  polyethylene glycol (MIRALAX / GLYCOLAX) packet 17 g (has no administration in time range)  nitroGLYCERIN (NITROSTAT) SL tablet 0.4 mg (has no administration in time range)  metoprolol succinate (TOPROL-XL) 24 hr tablet 12.5 mg (has no administration in time range)  predniSONE (DELTASONE) tablet 2.5 mg (has no administration in time range)  acetaminophen (TYLENOL) tablet 650 mg (has no administration in time range)    Or  acetaminophen (TYLENOL) suppository 650 mg (has no administration in time range)  senna-docusate (Senokot-S) tablet 1 tablet (has no administration in time range)  ondansetron (ZOFRAN) tablet 4 mg (has no administration in time range)    Or  ondansetron (ZOFRAN) injection 4 mg (has no administration in time range)  furosemide (LASIX) injection 40 mg (has no administration in time range)  furosemide (LASIX) injection 40 mg (40 mg Intravenous Given 10/26/19 1800)    ED Course  I have reviewed the triage vital signs and the nursing notes.  Pertinent labs & imaging  results that were available during my care of the patient were reviewed by me and considered in my medical decision making (see chart for details).  86 yo female w/ mitral valve replacement, severe TR (not an operative candidate), parox a fib on eliquis, presenting to the ED with bilateral leg swelling and dyspnea on exertion.  Here found to have increased BNP (steadily rising this past year, now >600), with left sided moderate pleural effusion.  When we try to ambulate her she gets quite dyspneic and drops O2 sat to 86%.  I suspect this may be CHF related to her TR or an alternative cause.  We'll give 40 mg IV lasix (she is on 20 mg "as needed" per her nursing report) in the ED, and admit her to the hospital.  Lower suspicion for covid, PNA at this time.  No infectious complaints.  Edema is symmetrical bilaterally.  Doubtful this is a DVT or PE.  Clinical Course as of Oct 25 1912  Wed Oct 26, 2019  1656 Labs are all stable from prior.  Minor elevation in BNP.  She has a minor worsening of her left sided pleural effusion.  No respiratory distress while lying in bed.  We'll try to ambulate here for a pulse ox.   [MT]  1712 Amb pulse ox dropped to 86%, patient quite short of breath.  According to office note Jan 2021 with cardiology, she was ambulating at 98%.  We'll give diuresis and admit for CHF exacerbation, repeat echocardiogram.   [MT]  1719 Will admit to hospitalist   [MT]    Clinical Course User Index [MT] Langston Masker, Carola Rhine, MD    Final Clinical Impression(s) / ED Diagnoses Final diagnoses:  Pleural effusion on left  Dyspnea on exertion  Acute on chronic congestive heart failure, unspecified heart failure type [facility])    Rx / DC Orders ED Discharge Orders    None       Octaviano Glow  J, MD 10/26/19 [id number]

## 2019-10-26 NOTE — ED Triage Notes (Addendum)
Patient via EMS from [facility]   AOx4  Hx: CHF, PRN lasix the last couple of days and did not relieve swelling so patient's Dr sent patient to ER to manage   Increased swelling in legs for a few days - per facility  Patient c/o pain in legs from the swelling   No SOB, clear lungs   99% RA 128/72 BP 64 HR 16 RR 97.5 temp

## 2019-10-26 NOTE — ED Notes (Signed)
PATIENT GIVEN GRAHAM CRACKERS AND GINGER ALE

## 2019-10-26 NOTE — ED Notes (Signed)
Labeled urine and culture specimen at bedside. [employer]

## 2019-10-26 NOTE — H&P (Signed)
History and Physical    Nancy Bush [id number] DOB: 11/04/1925 DOA: 10/26/2019  PCP: Lawerance Cruel, MD   Patient coming from: ALF  I have personally briefly reviewed patient's old medical records in [place]  Chief Complaint: Shortness of breath  HPI: Nancy Bush is a 86 y.o. female with medical history significant of paroxysmal A. fib status post maze operation on Eliquis, hypertension, severe mitral valve regurgitation status post mitral valve replacement, severe TR being managed conservatively, chronic diastolic heart failure, symptomatic bradycardia status post pacemaker placement presented from assisted living facility today for worsening shortness of breath.  Patient is a poor historian and history is limited.  She states that she has been having increasingly worsening shortness of breath for the last few days along with worsening bilateral leg swelling.  She denies any fever, worsening cough, chest pain, nausea, vomiting, abdominal pain, hematuria or dysuria.  Her oral intake is poor recently.  She feels short of breath even with minimal exertion.  ED Course: She was found to be hypoxic in the 80s even with minimal ambulation.  Chest x-ray showed moderate left pleural effusion but no evidence of pneumonia.  Intravenous Lasix was ordered.  Hospitalist service was called to evaluate the patient.  Review of Systems: As per HPI otherwise all other 10 point systems were reviewed and are negative.   Past Medical History:  Diagnosis Date  . Asthma   . Atrial fibrillation ([place])   . CHF (congestive heart failure) ([place])   . COPD (chronic obstructive pulmonary disease) ([place])   . H/O: hysterectomy   . Hypertension   . LBBB (left bundle branch block)    per discharge note 2003  . Mitral regurgitation    MILD  . MVP (mitral valve prolapse)   . Pacemaker   . Pulmonary hypertension ([place])    MILD TO MODERATE BY ECHO  . PVC (premature ventricular contraction)     . Rheumatoid arthritis(714.0)   . S/P Maze operation for atrial fibrillation 05/18/2012   Complete biatrial lesion set using cryothermy with oversewing of LA appendage  . S/P mitral valve replacement with bioprosthetic valve 05/18/2012   43mm Edwards Magna Mitral pericardial bioprosthesis     Past Surgical History:  Procedure Laterality Date  . ABDOMINAL HYSTERECTOMY    . APPENDECTOMY    . CATARACT EXTRACTION    . CHOLECYSTECTOMY    . hysterectomy    . MAZE  05/18/2012   Procedure: MAZE;  Surgeon: Rexene Alberts, MD;  Location: [facility];  Service: Open Heart Surgery;  Laterality: Right;  . MITRAL VALVE REPLACEMENT  05/18/2012   Procedure: MINIMALLY INVASIVE MITRAL VALVE (MV) REPLACEMENT;  Surgeon: Rexene Alberts, MD;  Location: [facility];  Service: Open Heart Surgery;  Laterality: N/A;  . OVARIAN CYST REMOVAL    . PACEMAKER INSERTION    . PERMANENT PACEMAKER INSERTION N/A 05/21/2012   Procedure: PERMANENT PACEMAKER INSERTION;  Surgeon: Evans Lance, MD;  Location: [facility] CATH LAB;  Service: Cardiovascular;  Laterality: N/A;  . TEE WITHOUT CARDIOVERSION  02/09/2012   Procedure: TRANSESOPHAGEAL ECHOCARDIOGRAM (TEE);  Surgeon: Fay Records, MD;  Location: [facility] ENDOSCOPY;  Service: Cardiovascular;  Laterality: N/A;   Social history  reports that she has never smoked. She has never used smokeless tobacco. She reports that she does not drink alcohol or use drugs.  Currently lives in an assisted living facility  Allergies  Allergen Reactions  . Promethazine Hcl Other (See Comments)  unknown  . Septra [Sulfamethoxazole-Trimethoprim]   . Sulfonamide Derivatives Other (See Comments)    unknown  . Tequin Other (See Comments)    hallucinations    Family history -Could not be obtained because of patient being a poor historian.   Prior to Admission medications   Medication Sig Start Date End Date Taking? Authorizing Provider  albuterol (VENTOLIN HFA) 108 (90 Base) MCG/ACT inhaler Inhale 2  puffs into the lungs every 8 (eight) hours as needed for wheezing or shortness of breath. 10/11/19  Yes [provider]  apixaban (ELIQUIS) 2.5 MG TABS tablet Take 1 tablet (2.5 mg total) by mouth 2 (two) times daily. 08/30/19  Yes Minus Breeding, MD  Calcium Polycarbophil (FIBERCON PO) Take 1 packet by mouth 2 (two) times daily.   Yes [provider]  furosemide (LASIX) 20 MG tablet Take 20 mg by mouth See admin instructions. 20mg  daily An extra 20mg  daily as needed for swelling   Yes [provider]  metoprolol succinate (TOPROL-XL) 25 MG 24 hr tablet Take 12.5 mg by mouth daily.   Yes [provider]  montelukast (SINGULAIR) 10 MG tablet Take 10 mg by mouth daily.  12/31/12  Yes [provider]  Multiple Vitamins-Minerals (PRESERVISION AREDS PO) Take 1 tablet by mouth 2 (two) times daily. Preservision Areds    Yes [provider]  nitroGLYCERIN (NITROSTAT) 0.4 MG SL tablet Place 0.4 mg under the tongue every 5 (five) minutes as needed for chest pain (x 3 tabs daily).   Yes [provider]  polycarbophil (FIBERCON) 625 MG tablet Take 312.5 mg by mouth daily.   Yes [provider]  polyethylene glycol (MIRALAX / GLYCOLAX) 17 g packet Take 34 g by mouth daily.   Yes [provider]  predniSONE (DELTASONE) 5 MG tablet Take 2.5 mg by mouth every other day.   Yes [provider]  metoprolol succinate (TOPROL-XL) 25 MG 24 hr tablet Take 0.5 tablets (12.5 mg total) by mouth daily. Patient not taking: Reported on 10/26/2019 08/31/19   Deberah Pelton, NP    Physical Exam: Vitals:   10/26/19 1600 10/26/19 1630 10/26/19 1700 10/26/19 1730  BP: 123/65 124/62 139/68 132/64  Pulse: 62 (!) 59 71 72  Resp: 16 14 17 17   Temp:      TempSrc:      SpO2: 100% 98% 97% 98%  Weight:      Height:        Constitutional: No distress.  Elderly female lying in bed.  Looks chronically ill.  Hard of hearing.  Poor  historian. Vitals:   10/26/19 1600 10/26/19 1630 10/26/19 1700 10/26/19 1730  BP: 123/65 124/62 139/68 132/64  Pulse: 62 (!) 59 71 72  Resp: 16 14 17 17   Temp:      TempSrc:      SpO2: 100% 98% 97% 98%  Weight:      Height:       Eyes: PERRL, lids and conjunctivae normal ENMT: Mucous membranes are moist. Posterior pharynx clear of any exudate or lesions. Neck: normal, supple, no masses, no thyromegaly Respiratory: bilateral decreased breath sounds at bases with bibasilar crackles, no wheezing. Normal respiratory effort. No accessory muscle use.  Cardiovascular: S1 S2 positive, intermittently bradycardic.  Bilateral lower extremity 1-2+ edema.  2+ pedal pulses.  Abdomen: no tenderness, no masses palpated. No hepatosplenomegaly. Bowel sounds positive.  Musculoskeletal: no clubbing / cyanosis. No joint deformity upper and lower extremities.  Skin: no rashes, lesions, ulcers. No  induration Neurologic: CN 2-12 grossly intact. Moving extremities. No focal neurologic deficits.  Psychiatric: Looks slightly anxious.   Labs on Admission: I have personally reviewed following labs and imaging studies  CBC: Recent Labs  Lab 10/26/19 1518  WBC 3.0*  HGB 9.1*  HCT 30.6*  MCV 94.4  PLT 97*   Basic Metabolic Panel: Recent Labs  Lab 10/26/19 1518  NA 135  K 4.2  CL 97*  CO2 26  GLUCOSE 123*  BUN 15  CREATININE 0.88  CALCIUM 8.6*   GFR: Estimated Creatinine Clearance: 26.8 mL/min (by C-G formula based on SCr of 0.88 mg/dL). Liver Function Tests: No results for input(s): AST, ALT, ALKPHOS, BILITOT, PROT, ALBUMIN in the last 168 hours. No results for input(s): LIPASE, AMYLASE in the last 168 hours. No results for input(s): AMMONIA in the last 168 hours. Coagulation Profile: No results for input(s): INR, PROTIME in the last 168 hours. Cardiac Enzymes: No results for input(s): CKTOTAL, CKMB, CKMBINDEX, TROPONINI in the last 168 hours. BNP (last 3 results) No results for input(s):  PROBNP in the last 8760 hours. HbA1C: No results for input(s): HGBA1C in the last 72 hours. CBG: No results for input(s): GLUCAP in the last 168 hours. Lipid Profile: No results for input(s): CHOL, HDL, LDLCALC, TRIG, CHOLHDL, LDLDIRECT in the last 72 hours. Thyroid Function Tests: No results for input(s): TSH, T4TOTAL, FREET4, T3FREE, THYROIDAB in the last 72 hours. Anemia Panel: No results for input(s): VITAMINB12, FOLATE, FERRITIN, TIBC, IRON, RETICCTPCT in the last 72 hours. Urine analysis:    Component Value Date/Time   COLORURINE YELLOW 08/26/2019 1754   APPEARANCEUR HAZY (A) 08/26/2019 1754   APPEARANCEUR Clear 10/12/2018 1429   LABSPEC 1.015 08/26/2019 1754   PHURINE 6.0 08/26/2019 1754   GLUCOSEU NEGATIVE 08/26/2019 1754   HGBUR NEGATIVE 08/26/2019 1754   BILIRUBINUR NEGATIVE 08/26/2019 1754   BILIRUBINUR Negative 10/12/2018 1429   KETONESUR 20 (A) 08/26/2019 1754   PROTEINUR 30 (A) 08/26/2019 1754   UROBILINOGEN 0.2 08/21/2012 1645   NITRITE NEGATIVE 08/26/2019 1754   LEUKOCYTESUR NEGATIVE 08/26/2019 1754    Radiological Exams on Admission: DG Chest Portable 1 View  Result Date: 10/26/2019 CLINICAL DATA:  Leg swelling increasing for a few days. Also with leg pain. EXAM: PORTABLE CHEST 1 VIEW COMPARISON:  08/26/2019 FINDINGS: Stable changes from prior cardiac surgery and valve replacement cardiac silhouette is mildly enlarged. No mediastinal or hilar masses. Moderate left pleural effusion obscures portions of the left heart border and the left hemidiaphragm. Possible small right pleural effusion. Is mild opacity at the left lung base adjacent to the pleural effusion consistent with atelectasis. Remainder of the lungs is clear. No pneumothorax. Left anterior chest wall dual lead pacemaker is stable. Skeletal structures are grossly intact. IMPRESSION: 1. Moderate left pleural effusion which has increased compared to the prior exam. Mild associated left lung base atelectasis.  2. No evidence of pneumonia and no pulmonary edema. Electronically Signed   By: Lajean Manes M.D.   On: 10/26/2019 14:44  Chest x-ray was reviewed by myself  EKG: Independently reviewed.  Paced rhythm with no ST elevations or depression.  Assessment/Plan  Acute on chronic diastolic heart failure Moderate left-sided pleural effusion -Presented with worsening shortness of breath and bilateral leg swelling. -Takes Lasix 20 mg at home -will treat with Lasix 40 mg IV every 12 hours.  Continue strict input and output.  Daily weights.  Fluid restriction.  Repeat 2D echo. -If symptoms do not improve, will consider cardiology evaluation -  Continue low-dose beta-blocker.  History of severe mitral valve regurgitation status post MVR History of severe TR -We will repeat 2D echo.  Patient follows up with cardiology as an outpatient and is being managed conservatively  Paroxysmal A. Fib -With history of maze procedure.  Currently rate controlled.  Continue metoprolol.  Continue Eliquis  Severe pulmonary hypertension  -Being managed medically.  Outpatient follow-up with cardiology  Chronic kidney disease stage III -Creatinine stable.  Monitor  Hypertension -Blood pressure stable.  Continue metoprolol  Generalized deconditioning -PT eval -We will request palliative care evaluation for goals of care discussion as patient has severe pulmonary hypertension/severe TR being managed medically   DVT prophylaxis: Eliquis Code Status: DNR Family Communication: Spoke to patient at bedside Disposition Plan: Probably back to assisted living facility in 2 to 3 days after patient improved symptomatically Consults called: None Admission status: Inpatient/telemetry  Severity of Illness: The appropriate patient status for this patient is INPATIENT. Inpatient status is judged to be reasonable and necessary in order to provide the required intensity of service to ensure the patient's safety. The patient's  presenting symptoms, physical exam findings, and initial radiographic and laboratory data in the context of their chronic comorbidities is felt to place them at high risk for further clinical deterioration. Furthermore, it is not anticipated that the patient will be medically stable for discharge from the hospital within 2 midnights of admission. The following factors support the patient status of inpatient.   " The patient's presenting symptoms include shortness of breath/neck swelling. " The worrisome physical exam findings include basilar crackles/pedal edema. " The initial radiographic and laboratory data are worrisome because of left-sided pleural effusion. " The chronic co-morbidities include chronic CHF/severe TR/paroxysmal A. Fib.  Patient will need at least 2 midnights of hospital stay for IV diuresis.  She looks to be volume overloaded and is currently hypoxic on ambulation and will need strict input and output monitoring, 2D echo and might need cardiology input as well.   * I certify that at the point of admission it is my clinical judgment that the patient will require inpatient hospital care spanning beyond 2 midnights from the point of admission due to high intensity of service, high risk for further deterioration and high frequency of surveillance required.Aline August MD [facility]  10/26/2019, 5:44 PM

## 2019-10-26 NOTE — ED Notes (Signed)
Patient currently sitting on bedside commode

## 2019-10-27 ENCOUNTER — Inpatient Hospital Stay (HOSPITAL_COMMUNITY): Payer: Medicare Other

## 2019-10-27 DIAGNOSIS — I50813 Acute on chronic right heart failure: Secondary | ICD-10-CM

## 2019-10-27 DIAGNOSIS — I272 Pulmonary hypertension, unspecified: Secondary | ICD-10-CM

## 2019-10-27 DIAGNOSIS — R0600 Dyspnea, unspecified: Secondary | ICD-10-CM

## 2019-10-27 DIAGNOSIS — Z953 Presence of xenogenic heart valve: Secondary | ICD-10-CM

## 2019-10-27 DIAGNOSIS — I5031 Acute diastolic (congestive) heart failure: Secondary | ICD-10-CM

## 2019-10-27 DIAGNOSIS — I071 Rheumatic tricuspid insufficiency: Secondary | ICD-10-CM

## 2019-10-27 LAB — COMPREHENSIVE METABOLIC PANEL
ALT: 15 U/L (ref 0–44)
AST: 19 U/L (ref 15–41)
Albumin: 3.6 g/dL (ref 3.5–5.0)
Alkaline Phosphatase: 58 U/L (ref 38–126)
Anion gap: 8 (ref 5–15)
BUN: 15 mg/dL (ref 8–23)
CO2: 28 mmol/L (ref 22–32)
Calcium: 8.5 mg/dL — ABNORMAL LOW (ref 8.9–10.3)
Chloride: 100 mmol/L (ref 98–111)
Creatinine, Ser: 0.92 mg/dL (ref 0.44–1.00)
GFR calc Af Amer: >60 mL/min (ref >60)
GFR calc non Af Amer: 54 mL/min — ABNORMAL LOW (ref >60)
Glucose, Bld: 109 mg/dL — ABNORMAL HIGH (ref 70–99)
Potassium: 3.5 mmol/L (ref 3.5–5.1)
Sodium: 136 mmol/L (ref 135–145)
Total Bilirubin: 0.9 mg/dL (ref 0.3–1.2)
Total Protein: 6.3 g/dL — ABNORMAL LOW (ref 6.5–8.1)

## 2019-10-27 LAB — MAGNESIUM: Magnesium: 2 mg/dL (ref 1.7–2.4)

## 2019-10-27 LAB — ECHOCARDIOGRAM COMPLETE
Height: 56 in
Weight: 1664.91 oz

## 2019-10-27 LAB — CBC
HCT: 29.8 % — ABNORMAL LOW (ref 36.0–46.0)
Hemoglobin: 9.3 g/dL — ABNORMAL LOW (ref 12.0–15.0)
MCH: 28.9 pg (ref 26.0–34.0)
MCHC: 31.2 g/dL (ref 30.0–36.0)
MCV: 92.5 fL (ref 80.0–100.0)
Platelets: 91 10*3/uL — ABNORMAL LOW (ref 150–400)
RBC: 3.22 MIL/uL — ABNORMAL LOW (ref 3.87–5.11)
RDW: 14.5 % (ref 11.5–15.5)
WBC: 2.8 10*3/uL — ABNORMAL LOW (ref 4.0–10.5)
nRBC: 0 % (ref 0.0–0.2)

## 2019-10-27 LAB — SARS CORONAVIRUS 2 (TAT 6-24 HRS): SARS Coronavirus 2: NEGATIVE

## 2019-10-27 MED ORDER — KATE FARMS STANDARD 1.4 PO LIQD
325.0000 mL | ORAL | Status: DC
  Administered 2019-10-28 – 2019-10-29 (×2): 325 mL via ORAL
  Filled 2019-10-27 (×2): qty 325

## 2019-10-27 NOTE — Evaluation (Signed)
Occupational Therapy Evaluation Patient Details Name: Nancy Bush MRN: [id number] DOB: 1926-07-15 Today's Date: 10/27/2019    History of Present Illness Pt admitted from ALF with FTT and SOB.  Pt with hx of COPD, a-fib, LBBB, RA, mitral valve replacement, pacemaker, and CHF   Clinical Impression   Pt admitted with the above diagnoses and presents with below problem list. Pt will benefit from continued acute OT to address the below listed deficits and maximize independence with basic ADLs prior to d/c back to ALF. At baseline pt is mod I with basic ADLs, uses quad cane for mobility. Pt currently min guard with LB ADLs, functional transfers and mobility in the room. Tolerated session well though some fatigue noted. Up to the recliner, eating breakfast at end of session.      Follow Up Recommendations  Home health OT (from ALF)   Equipment Recommendations  None recommended by OT    Recommendations for Other Services       Precautions / Restrictions Precautions Precautions: Fall Restrictions Weight Bearing Restrictions: No      Mobility Bed Mobility Overal bed mobility: Needs Assistance Bed Mobility: Supine to Sit     Supine to sit: Supervision     General bed mobility comments: supervision for safety, extra time. no physical assist needed.   Transfers Overall transfer level: Needs assistance Equipment used: Quad cane Transfers: Sit to/from Stand Sit to Stand: Min guard         General transfer comment: min guard for safety. to/from EOB, BSC, and recliner    Balance Overall balance assessment: Needs assistance Sitting-balance support: Single extremity supported;Feet supported Sitting balance-Leahy Scale: Fair     Standing balance support: No upper extremity supported;Single extremity supported Standing balance-Leahy Scale: Fair Standing balance comment: single extremity support for dynamic tasks                           ADL either  performed or assessed with clinical judgement   ADL Overall ADL's : Needs assistance/impaired Eating/Feeding: Set up;Sitting   Grooming: Set up;Sitting   Upper Body Bathing: Set up;Sitting   Lower Body Bathing: Min guard;Sit to/from stand   Upper Body Dressing : Set up;Sitting   Lower Body Dressing: Min guard;Sit to/from stand   Toilet Transfer: Min guard;Ambulation   Toileting- Clothing Manipulation and Hygiene: Min guard;Sit to/from stand   Tub/ Shower Transfer: Walk-in shower;Min guard;Ambulation   Functional mobility during ADLs: Min guard;Cane General ADL Comments: Quad cane utilized for all OOB activity. Extra time to complete ADL tasks but no physical assist needed this session. Pt completed toilet transfer, pericare, stood to wash hands at sink, walked bathroom distance     Vision         Perception     Praxis      Pertinent Vitals/Pain Pain Assessment: No/denies pain     Hand Dominance Right   Extremity/Trunk Assessment Upper Extremity Assessment Upper Extremity Assessment: Generalized weakness   Lower Extremity Assessment Lower Extremity Assessment: Defer to PT evaluation       Communication Communication Communication: HOH   Cognition Arousal/Alertness: Awake/alert Behavior During Therapy: WFL for tasks assessed/performed Overall Cognitive Status: Within Functional Limits for tasks assessed                                     General Comments  Exercises     Shoulder Instructions      Home Living Family/patient expects to be discharged to:: Assisted living                             Home Equipment: Gilford Rile - 2 wheels;Cane - quad          Prior Functioning/Environment Level of Independence: Independent with assistive device(s)  Gait / Transfers Assistance Needed: quad cane; meals brought to room              OT Problem List: Decreased activity tolerance;Impaired balance (sitting and/or  standing);Decreased knowledge of use of DME or AE;Decreased knowledge of precautions;Cardiopulmonary status limiting activity      OT Treatment/Interventions: Self-care/ADL training;Therapeutic exercise;Energy conservation;DME and/or AE instruction;Therapeutic activities;Patient/family education;Balance training    OT Goals(Current goals can be found in the care plan section) Acute Rehab OT Goals Patient Stated Goal: back to ALF OT Goal Formulation: With patient Time For Goal Achievement: 11/10/19 Potential to Achieve Goals: Good ADL Goals Pt Will Perform Grooming: with modified independence;sitting;standing Pt Will Perform Upper Body Bathing: with set-up;sitting Pt Will Perform Lower Body Bathing: with modified independence;sit to/from stand Pt Will Transfer to Toilet: with modified independence;ambulating Pt Will Perform Toileting - Clothing Manipulation and hygiene: with modified independence;sit to/from stand  OT Frequency: Min 2X/week   Barriers to D/C:            Co-evaluation              AM-PAC OT "6 Clicks" Daily Activity     Outcome Measure Help from another person eating meals?: None Help from another person taking care of personal grooming?: None Help from another person toileting, which includes using toliet, bedpan, or urinal?: A Little Help from another person bathing (including washing, rinsing, drying)?: A Little Help from another person to put on and taking off regular upper body clothing?: None Help from another person to put on and taking off regular lower body clothing?: A Little 6 Click Score: 21   End of Session Equipment Utilized During Treatment: Other (comment)(quad cane)  Activity Tolerance: Patient tolerated treatment well;Patient limited by fatigue Patient left: in chair;with call bell/phone within reach;with chair alarm set  OT Visit Diagnosis: Unsteadiness on feet (R26.81);Muscle weakness (generalized) (M62.81)                Time:  [id number] OT Time Calculation (min): 24 min Charges:  OT General Charges $OT Visit: 1 Visit OT Evaluation $OT Eval Low Complexity: 1 Low OT Treatments $Self Care/Home Management : 8-22 mins  Tyrone Schimke, OT Acute Rehabilitation Services Pager: [phone] Office: [phone]   Nancy Bush 10/27/2019, 12:47 PM

## 2019-10-27 NOTE — Progress Notes (Signed)
  Echocardiogram 2D Echocardiogram has been performed.  Nancy Bush 10/27/2019, 9:03 AM

## 2019-10-27 NOTE — Progress Notes (Signed)
PT Cancellation Note  Patient Details Name: Nancy Bush MRN: [id number] DOB: 1926/01/14   Cancelled Treatment:    Reason Eval/Treat Not Completed: Patient at procedure or test/unavailable Working with OT, will check back.   Dai Apel,KATHrine E 10/27/2019, 9:57 AM  Jannette Spanner PT, DPT Acute Rehabilitation Services Office: [phone]

## 2019-10-27 NOTE — Evaluation (Signed)
Physical Therapy Evaluation Patient Details Name: BERRY VENABLE MRN: [id number] DOB: 08-Sep-1925 Today's Date: 10/27/2019   History of Present Illness  Pt admitted from ALF with FTT and SOB.  Pt with hx of COPD, a-fib, LBBB, RA, mitral valve replacement, pacemaker, and CHF  Clinical Impression  Pt admitted with above diagnosis. Pt currently with functional limitations due to the deficits listed below (see PT Problem List). Pt will benefit from skilled PT to increase their independence and safety with mobility to allow discharge to the venue listed below.  Pt assisted with ambulating in hallway.  Pt ambulates with quad cane at baseline.  Pt from ALF and likely able to return to ALF.     Follow Up Recommendations Home health PT;Supervision - Intermittent    Equipment Recommendations  None recommended by PT    Recommendations for Other Services       Precautions / Restrictions Precautions Precautions: Fall      Mobility  Bed Mobility               General bed mobility comments: up to recliner by OT  Transfers Overall transfer level: Needs assistance Equipment used: None Transfers: Sit to/from Stand Sit to Stand: Min guard         General transfer comment: min/guard for safety, cues for hand placement  Ambulation/Gait Ambulation/Gait assistance: Min guard Gait Distance (Feet): 120 Feet Assistive device: Quad cane Gait Pattern/deviations: Step-through pattern;Decreased stride length Gait velocity: decr   General Gait Details: small steps, has been using quad cane since 2013, required 3-4 standing rest breaks due to fatigue, SPO2 94-97% on room air during ambulation  Stairs            Wheelchair Mobility    Modified Rankin (Stroke Patients Only)       Balance Overall balance assessment: Needs assistance         Standing balance support: No upper extremity supported Standing balance-Leahy Scale: Fair                                Pertinent Vitals/Pain Pain Assessment: No/denies pain    Home Living Family/patient expects to be discharged to:: Assisted living               Home Equipment: Walker - 2 wheels;Cane - quad      Prior Function Level of Independence: Independent with assistive device(s)   Gait / Transfers Assistance Needed: quad cane; meals brought to room           Hand Dominance        Extremity/Trunk Assessment        Lower Extremity Assessment Lower Extremity Assessment: Generalized weakness       Communication      Cognition Arousal/Alertness: Awake/alert Behavior During Therapy: WFL for tasks assessed/performed Overall Cognitive Status: Difficult to assess                                        General Comments      Exercises     Assessment/Plan    PT Assessment Patient needs continued PT services  PT Problem List Decreased strength;Decreased mobility;Decreased activity tolerance       PT Treatment Interventions DME instruction;Therapeutic exercise;Gait training;Functional mobility training;Therapeutic activities;Patient/family education    PT Goals (Current goals can be found in the Care Plan  section)  Acute Rehab PT Goals PT Goal Formulation: With patient Time For Goal Achievement: 11/10/19 Potential to Achieve Goals: Good    Frequency Min 3X/week   Barriers to discharge        Co-evaluation               AM-PAC PT "6 Clicks" Mobility  Outcome Measure Help needed turning from your back to your side while in a flat bed without using bedrails?: A Little Help needed moving from lying on your back to sitting on the side of a flat bed without using bedrails?: A Little Help needed moving to and from a bed to a chair (including a wheelchair)?: A Little Help needed standing up from a chair using your arms (e.g., wheelchair or bedside chair)?: A Little Help needed to walk in hospital room?: A Little Help needed climbing 3-5  steps with a railing? : A Little 6 Click Score: 18    End of Session Equipment Utilized During Treatment: Gait belt Activity Tolerance: Patient tolerated treatment well Patient left: in chair;with call bell/phone within reach;with chair alarm set   PT Visit Diagnosis: Muscle weakness (generalized) (M62.81);Difficulty in walking, not elsewhere classified (R26.2)    Time: 1020-1036 PT Time Calculation (min) (ACUTE ONLY): 16 min   Charges:   PT Evaluation $PT Eval Low Complexity: 1 Low     Kati PT, DPT Acute Rehabilitation Services Office: [phone]  Lassie Demorest,KATHrine E 10/27/2019, 11:55 AM

## 2019-10-27 NOTE — Progress Notes (Signed)
Patient ID: Nancy Bush, female   DOB: [birth year], 86 y.o.   MRN: [id number]  PROGRESS NOTE    Nancy Bush  [id number] DOB: [birth year] DOA: 10/26/2019 PCP: Lawerance Cruel, MD   Brief Narrative:  86 y.o. female with medical history significant of paroxysmal A. fib status post maze operation on Eliquis, hypertension, severe mitral valve regurgitation status post mitral valve replacement, severe TR being managed conservatively, chronic diastolic heart failure, symptomatic bradycardia status post pacemaker placement presented from assisted living facility today for worsening shortness of breath. She was found to be hypoxic in the 80s even with minimal ambulation.  Chest x-ray showed moderate left pleural effusion but no evidence of pneumonia.  She was admitted for CHF exacerbation and started on IV Lasix.   Assessment & Plan:   Acute on chronic diastolic heart failure Moderate left-sided pleural effusion -Presented with worsening shortness of breath and bilateral leg swelling. -Takes Lasix 20 mg at home -Continue Lasix 40 mg IV every 12 hours.  Continue strict input and output.  Daily weights.  Fluid restriction.  Follow-up 2D echo. -request cardiology evaluation -Continue low-dose beta-blocker.  History of severe mitral valve regurgitation status post MVR History of severe TR -Follow-up 2D echo.  Patient follows up with cardiology as an outpatient and is being managed conservatively  Paroxysmal A. Fib -With history of maze procedure.  Currently rate controlled.  Continue metoprolol.  Continue Eliquis  Severe pulmonary hypertension  -Being managed medically.    Follow cardiology recommendations.  Chronic kidney disease stage III -Creatinine stable.  Monitor  Hypertension -Blood pressure stable.  Continue metoprolol  Pancytopenia Anemia of chronic disease: Likely from chronic any disease -Questionable cause.  No signs of bleeding.  Monitor   Generalized  deconditioning -PT eval -We will request palliative care evaluation for goals of care discussion as patient has severe pulmonary hypertension/severe TR being managed medically    DVT prophylaxis: Eliquis Code Status: DNR Family Communication: Spoke to patient at bedside Disposition Plan: Probably back to assisted living facility in 1-2 days after patient improved symptomatically  Consultants:  will request cardiology consult  Procedures: None  Antimicrobials: None   Subjective: Patient seen and examined bedside.  She is a poor historian.  Still feels short of breath with minimal exertion.  No overnight chest pain, fever or vomiting.  Objective: Vitals:   10/26/19 2100 10/27/19 0041 10/27/19 0500 10/27/19 0515  BP: 129/61 139/61  (!) 110/50  Pulse: 61 60  63  Resp: 11 16  17   Temp:  97.7 F (36.5 C)  98 F (36.7 C)  TempSrc:  Oral  Oral  SpO2: 97% 100%  95%  Weight:   47.2 kg   Height:       No intake or output data in the 24 hours ending 10/27/19 0730 Filed Weights   10/26/19 1348 10/27/19 0500  Weight: 47.2 kg 47.2 kg    Examination:  General exam: Appears calm and comfortable.  Chronically ill looking elderly female lying in bed.  Poor historian. Respiratory system: Bilateral decreased breath sounds at bases with basilar crackles, more on the left side.  No wheezing Cardiovascular system: S1 & S2 heard, Rate controlled Gastrointestinal system: Abdomen is nondistended, soft and nontender. Normal bowel sounds heard. Extremities: No cyanosis, clubbing; 1-2+ lower extremity pitting edema Central nervous system: Alert and oriented. No focal neurological deficits. Moving extremities Skin: No rashes, lesions or ulcers Psychiatry: Flat affect.    Data Reviewed: I have personally reviewed following labs  and imaging studies  CBC: Recent Labs  Lab 10/26/19 1518 10/27/19 0424  WBC 3.0* 2.8*  HGB 9.1* 9.3*  HCT 30.6* 29.8*  MCV 94.4 92.5  PLT 97* 91*   Basic  Metabolic Panel: Recent Labs  Lab 10/26/19 1518 10/27/19 0424  NA 135 136  K 4.2 3.5  CL 97* 100  CO2 26 28  GLUCOSE 123* 109*  BUN 15 15  CREATININE 0.88 0.92  CALCIUM 8.6* 8.5*  MG  --  2.0   GFR: Estimated Creatinine Clearance: 25.6 mL/min (by C-G formula based on SCr of 0.92 mg/dL). Liver Function Tests: Recent Labs  Lab 10/27/19 0424  AST 19  ALT 15  ALKPHOS 58  BILITOT 0.9  PROT 6.3*  ALBUMIN 3.6   No results for input(s): LIPASE, AMYLASE in the last 168 hours. No results for input(s): AMMONIA in the last 168 hours. Coagulation Profile: No results for input(s): INR, PROTIME in the last 168 hours. Cardiac Enzymes: No results for input(s): CKTOTAL, CKMB, CKMBINDEX, TROPONINI in the last 168 hours. BNP (last 3 results) No results for input(s): PROBNP in the last 8760 hours. HbA1C: No results for input(s): HGBA1C in the last 72 hours. CBG: No results for input(s): GLUCAP in the last 168 hours. Lipid Profile: No results for input(s): CHOL, HDL, LDLCALC, TRIG, CHOLHDL, LDLDIRECT in the last 72 hours. Thyroid Function Tests: No results for input(s): TSH, T4TOTAL, FREET4, T3FREE, THYROIDAB in the last 72 hours. Anemia Panel: No results for input(s): VITAMINB12, FOLATE, FERRITIN, TIBC, IRON, RETICCTPCT in the last 72 hours. Sepsis Labs: No results for input(s): PROCALCITON, LATICACIDVEN in the last 168 hours.  Recent Results (from the past 240 hour(s))  SARS CORONAVIRUS 2 (TAT 6-24 HRS) Nasopharyngeal Nasopharyngeal Swab     Status: None   Collection Time: 10/26/19  6:01 PM   Specimen: Nasopharyngeal Swab  Result Value Ref Range Status   SARS Coronavirus 2 NEGATIVE NEGATIVE Final    Comment: (NOTE) SARS-CoV-2 target nucleic acids are NOT DETECTED. The SARS-CoV-2 RNA is generally detectable in upper and lower respiratory specimens during the acute phase of infection. Negative results do not preclude SARS-CoV-2 infection, do not rule out co-infections with other  pathogens, and should not be used as the sole basis for treatment or other patient management decisions. Negative results must be combined with clinical observations, patient history, and epidemiological information. The expected result is Negative. Fact Sheet for Patients: SugarRoll.be Fact Sheet for Healthcare Providers: https://www.woods-mathews.com/ This test is not yet approved or cleared by the [place] FDA and  has been authorized for detection and/or diagnosis of SARS-CoV-2 by FDA under an Emergency Use Authorization (EUA). This EUA will remain  in effect (meaning this test can be used) for the duration of the COVID-19 declaration under Section 56 4(b)(1) of the Act, 21 U.S.C. section 360bbb-3(b)(1), unless the authorization is terminated or revoked sooner. Performed at [facility], [place] [address], [place], [place] [address]          Radiology Studies: DG Chest Portable 1 View  Result Date: 10/26/2019 CLINICAL DATA:  Leg swelling increasing for a few days. Also with leg pain. EXAM: PORTABLE CHEST 1 VIEW COMPARISON:  08/26/2019 FINDINGS: Stable changes from prior cardiac surgery and valve replacement cardiac silhouette is mildly enlarged. No mediastinal or hilar masses. Moderate left pleural effusion obscures portions of the left heart border and the left hemidiaphragm. Possible small right pleural effusion. Is mild opacity at the left lung base adjacent to the pleural  effusion consistent with atelectasis. Remainder of the lungs is clear. No pneumothorax. Left anterior chest wall dual lead pacemaker is stable. Skeletal structures are grossly intact. IMPRESSION: 1. Moderate left pleural effusion which has increased compared to the prior exam. Mild associated left lung base atelectasis. 2. No evidence of pneumonia and no pulmonary edema. Electronically Signed   By: Lajean Manes M.D.   On: 10/26/2019 14:44        Scheduled  Meds: . apixaban  2.5 mg Oral BID  . furosemide  40 mg Intravenous Q12H  . metoprolol succinate  12.5 mg Oral Daily  . montelukast  10 mg Oral Daily  . polyethylene glycol  17 g Oral Daily  . predniSONE  2.5 mg Oral QODAY   Continuous Infusions:        Aline August, MD [facility] 10/27/2019, 7:30 AM

## 2019-10-27 NOTE — Consult Note (Signed)
Cardiology Consultation:   Patient ID: Nancy Bush MRN: [id number]; DOB: 05/10/1926  Admit date: 10/26/2019 Date of Consult: 10/27/2019  Primary Care Provider: Lawerance Cruel, MD Primary Cardiologist: Minus Breeding, MD  Primary Electrophysiologist:  None    Patient Profile:   Nancy Bush is a 86 y.o. female with a hx of paroxysmal atrial fibrillation with prior MAZE, pulmonary hypertension, mitral regurgitation s/p bioprosthetic MVR, symptomatic bradycardia s/p PPM who is being seen today for the evaluation of heart failure at the request of Dr. Starla Link.  History of Present Illness:   Ms. Nancy Bush was last seen in the cardiology office by Nancy Memos, NP on 08/31/19. Other recent notes reviewed as well. Noted to have fatigue, dyspnea on exertion at her last several visits.   She presented to the ER 10/26/19 from her assisted living facility with leg swelling and shortness of breath. On ambulation, her O2 sats noted to drop to 86%, which is a change from her most recent outpatient visit. Also noted to have bilateral LE edema.   She cannot give me much additional history beyond this.   Chest x-ray showed left pleural effusion. She had an echo repeated today which showed preserved EF of 60-65%, stable MVR without MR, severe pulmonary hypertension with moderate right ventricular enlargement and severe TR. Right atrial pressure at least 15 mmHg. RVSP estimated at 86 mmHg.   Past Medical History:  Diagnosis Date  . Asthma   . Atrial fibrillation ([place])   . CHF (congestive heart failure) ([place])   . COPD (chronic obstructive pulmonary disease) ([place])   . H/O: hysterectomy   . Hypertension   . LBBB (left bundle branch block)    per discharge note 2003  . Mitral regurgitation    MILD  . MVP (mitral valve prolapse)   . Pacemaker   . Pulmonary hypertension ([place])    MILD TO MODERATE BY ECHO  . PVC (premature ventricular contraction)   . Rheumatoid arthritis(714.0)   .  S/P Maze operation for atrial fibrillation 05/18/2012   Complete biatrial lesion set using cryothermy with oversewing of LA appendage  . S/P mitral valve replacement with bioprosthetic valve 05/18/2012   6mm Edwards Magna Mitral pericardial bioprosthesis     Past Surgical History:  Procedure Laterality Date  . ABDOMINAL HYSTERECTOMY    . APPENDECTOMY    . CATARACT EXTRACTION    . CHOLECYSTECTOMY    . hysterectomy    . MAZE  05/18/2012   Procedure: MAZE;  Surgeon: Rexene Alberts, MD;  Location: [facility];  Service: Open Heart Surgery;  Laterality: Right;  . MITRAL VALVE REPLACEMENT  05/18/2012   Procedure: MINIMALLY INVASIVE MITRAL VALVE (MV) REPLACEMENT;  Surgeon: Rexene Alberts, MD;  Location: [facility];  Service: Open Heart Surgery;  Laterality: N/A;  . OVARIAN CYST REMOVAL    . PACEMAKER INSERTION    . PERMANENT PACEMAKER INSERTION N/A 05/21/2012   Procedure: PERMANENT PACEMAKER INSERTION;  Surgeon: Evans Lance, MD;  Location: [facility] CATH LAB;  Service: Cardiovascular;  Laterality: N/A;  . TEE WITHOUT CARDIOVERSION  02/09/2012   Procedure: TRANSESOPHAGEAL ECHOCARDIOGRAM (TEE);  Surgeon: Fay Records, MD;  Location: [facility] ENDOSCOPY;  Service: Cardiovascular;  Laterality: N/A;     Home Medications:  Prior to Admission medications   Medication Sig Start Date End Date Taking? Authorizing Provider  albuterol (VENTOLIN HFA) 108 (90 Base) MCG/ACT inhaler Inhale 2 puffs into the lungs every 8 (eight) hours as needed for wheezing or shortness of breath.  10/11/19  Yes [provider]  apixaban (ELIQUIS) 2.5 MG TABS tablet Take 1 tablet (2.5 mg total) by mouth 2 (two) times daily. 08/30/19  Yes Minus Breeding, MD  Calcium Polycarbophil (FIBERCON PO) Take 1 packet by mouth 2 (two) times daily.   Yes [provider]  furosemide (LASIX) 20 MG tablet Take 20 mg by mouth See admin instructions. 20mg  daily An extra 20mg  daily as needed for swelling   Yes [provider]  metoprolol  succinate (TOPROL-XL) 25 MG 24 hr tablet Take 12.5 mg by mouth daily.   Yes [provider]  montelukast (SINGULAIR) 10 MG tablet Take 10 mg by mouth daily.  12/31/12  Yes [provider]  Multiple Vitamins-Minerals (PRESERVISION AREDS PO) Take 1 tablet by mouth 2 (two) times daily. Preservision Areds    Yes [provider]  nitroGLYCERIN (NITROSTAT) 0.4 MG SL tablet Place 0.4 mg under the tongue every 5 (five) minutes as needed for chest pain (x 3 tabs daily).   Yes [provider]  polycarbophil (FIBERCON) 625 MG tablet Take 312.5 mg by mouth daily.   Yes [provider]  polyethylene glycol (MIRALAX / GLYCOLAX) 17 g packet Take 34 g by mouth daily.   Yes [provider]  predniSONE (DELTASONE) 5 MG tablet Take 2.5 mg by mouth every other day.   Yes [provider]  metoprolol succinate (TOPROL-XL) 25 MG 24 hr tablet Take 0.5 tablets (12.5 mg total) by mouth daily. Patient not taking: Reported on 10/26/2019 08/31/19   Deberah Pelton, NP    Inpatient Medications: Scheduled Meds: . apixaban  2.5 mg Oral BID  . furosemide  40 mg Intravenous Q12H  . metoprolol succinate  12.5 mg Oral Daily  . montelukast  10 mg Oral Daily  . polyethylene glycol  17 g Oral Daily  . predniSONE  2.5 mg Oral QODAY   Continuous Infusions:  PRN Meds: acetaminophen **OR** acetaminophen, albuterol, nitroGLYCERIN, ondansetron **OR** ondansetron (ZOFRAN) IV, senna-docusate  Allergies:    Allergies  Allergen Reactions  . Promethazine Hcl Other (See Comments)    unknown  . Septra [Sulfamethoxazole-Trimethoprim]   . Sulfonamide Derivatives Other (See Comments)    unknown  . Tequin Other (See Comments)    hallucinations     Social History:   Social History   Socioeconomic History  . Marital status: Single    Spouse name: Not on file  . Number of children: Not on file  . Years of education: Not on file  . Highest education level: Not on file    Occupational History  . Not on file  Tobacco Use  . Smoking status: Never Smoker  . Smokeless tobacco: Never Used  Substance and Sexual Activity  . Alcohol use: No  . Drug use: No  . Sexual activity: Not Currently    Birth control/protection: Post-menopausal  Other Topics Concern  . Not on file  Social History Narrative  . Not on file   Social Determinants of Health   Financial Resource Strain:   . Difficulty of Paying Living Expenses:   Food Insecurity:   . Worried About Charity fundraiser in the Last Year:   . Arboriculturist in the Last Year:   Transportation Needs:   . Film/video editor (Medical):   Marland Kitchen Lack of Transportation (Non-Medical):   Physical Activity:   . Days of Exercise per Week:   . Minutes of Exercise per Session:   Stress:   .  Feeling of Stress :   Social Connections:   . Frequency of Communication with Friends and Family:   . Frequency of Social Gatherings with Friends and Family:   . Attends Religious Services:   . Active Member of Clubs or Organizations:   . Attends Archivist Meetings:   Marland Kitchen Marital Status:   Intimate Partner Violence:   . Fear of Current or Ex-Partner:   . Emotionally Abused:   Marland Kitchen Physically Abused:   . Sexually Abused:     Family History:   Patient cannot give me any family history today.  ROS:  Please see the history of present illness.  She cannot give me any additional review of systems on interview. Denies any other issues beyond shortness of breath and fatigue, answers no to all other questions, as below  Constitutional: Negative for chills, fever, night sweats, unintentional weight loss  HENT: Negative for ear pain and hearing loss.   Eyes: Negative for loss of vision and eye pain.  Respiratory: Negative for cough, sputum, wheezing.  Positive for dyspnea on exertion Cardiovascular: See HPI. Gastrointestinal: Negative for abdominal pain, melena, and hematochezia.  Genitourinary: Negative for dysuria  and hematuria.  Musculoskeletal: Negative for falls and myalgias.  Skin: Negative for itching and rash.  Neurological: Negative for focal weakness, focal sensory changes and loss of consciousness.  Endo/Heme/Allergies: Does not bruise/bleed easily.  All other ROS reviewed and negative.     Physical Exam/Data:   Vitals:   10/27/19 0041 10/27/19 0500 10/27/19 0515 10/27/19 1005  BP: 139/61  (!) 110/50 134/65  Pulse: 60  63 63  Resp: 16  17 19   Temp: 97.7 F (36.5 C)  98 F (36.7 C) 97.7 F (36.5 C)  TempSrc: Oral  Oral Oral  SpO2: 100%  95% 97%  Weight:  47.2 kg    Height:        Intake/Output Summary (Last 24 hours) at 10/27/2019 1340 Last data filed at 10/27/2019 0900 Gross per 24 hour  Intake 360 ml  Output --  Net 360 ml   Last 3 Weights 10/27/2019 10/26/2019 08/31/2019  Weight (lbs) 104 lb 0.9 oz 104 lb 88 lb 6.4 oz  Weight (kg) 47.2 kg 47.174 kg 40.098 kg     Body mass index is 23.33 kg/m.  General:  Frail appearing, sleeping comfortably in bed HEENT: normal, very hard of hearing Neck: TR pulsation to earlobe Endocrine:  No thryomegaly Vascular: RA pulses 2+ bilaterally Cardiac:  normal S1, S2; RRR; 3/6 SM Lungs:  Diminished breath sounds in bilateral bases, L>R Abd: soft, nontender, no hepatomegaly  Ext: trivial LE bilateral edema Musculoskeletal:  Moves all 4 limbs independently Skin: warm and dry  Neuro:  no focal abnormalities noted Psych:  Normal affect   EKG:  The EKG was personally reviewed and demonstrates:  Atrial-paced, ventricular-paced rhythm Telemetry:  Telemetry was personally reviewed and demonstrates:  Atrial-paced, ventricular-paced rhythm with occasional PVCs  Relevant CV Studies: Echo 10/27/19 1. Normal LV systolic function; mild LAE; s/p MVR with mean gradient of 6  mmHg and MVA 1.6 cm2; no MR; severe RAE; moderate RVE; severe TR; severe  pulmonary hypertension.  2. Left ventricular ejection fraction, by estimation, is 60 to 65%. The   left ventricle has normal function. The left ventricle has no regional  wall motion abnormalities. Left ventricular diastolic parameters are  indeterminate.  3. Right ventricular systolic function is normal. The right ventricular  size is moderately enlarged. There is severely elevated pulmonary artery  systolic pressure.  4. Left atrial size was mildly dilated.  5. Right atrial size was severely dilated.  6. The mitral valve is normal in structure. No evidence of mitral valve  regurgitation. No evidence of mitral stenosis. There is a 27 mm  bioprosthetic valve present in the mitral position. Procedure Date:  05/18/2012.  7. Large pleural effusion in the left lateral region.  8. Tricuspid valve regurgitation is severe.  9. The aortic valve is tricuspid. Aortic valve regurgitation is not  visualized. Mild aortic valve sclerosis is present, with no evidence of  aortic valve stenosis.  10. The inferior vena cava is dilated in size with <50% respiratory  variability, suggesting right atrial pressure of 15 mmHg.   Laboratory Data:  High Sensitivity Troponin:   Recent Labs  Lab 10/26/19 1518  TROPONINIHS 7     Chemistry Recent Labs  Lab 10/26/19 1518 10/27/19 0424  NA 135 136  K 4.2 3.5  CL 97* 100  CO2 26 28  GLUCOSE 123* 109*  BUN 15 15  CREATININE 0.88 0.92  CALCIUM 8.6* 8.5*  GFRNONAA 57* 54*  GFRAA >60 >60  ANIONGAP 12 8    Recent Labs  Lab 10/27/19 0424  PROT 6.3*  ALBUMIN 3.6  AST 19  ALT 15  ALKPHOS 58  BILITOT 0.9   Hematology Recent Labs  Lab 10/26/19 1518 10/27/19 0424  WBC 3.0* 2.8*  RBC 3.24* 3.22*  HGB 9.1* 9.3*  HCT 30.6* 29.8*  MCV 94.4 92.5  MCH 28.1 28.9  MCHC 29.7* 31.2  RDW 14.5 14.5  PLT 97* 91*   BNP Recent Labs  Lab 10/26/19 1518  BNP 632.3*    DDimer No results for input(s): DDIMER in the last 168 hours.   Radiology/Studies:  DG Chest Portable 1 View  Result Date: 10/26/2019 CLINICAL DATA:  Leg swelling  increasing for a few days. Also with leg pain. EXAM: PORTABLE CHEST 1 VIEW COMPARISON:  08/26/2019 FINDINGS: Stable changes from prior cardiac surgery and valve replacement cardiac silhouette is mildly enlarged. No mediastinal or hilar masses. Moderate left pleural effusion obscures portions of the left heart border and the left hemidiaphragm. Possible small right pleural effusion. Is mild opacity at the left lung base adjacent to the pleural effusion consistent with atelectasis. Remainder of the lungs is clear. No pneumothorax. Left anterior chest wall dual lead pacemaker is stable. Skeletal structures are grossly intact. IMPRESSION: 1. Moderate left pleural effusion which has increased compared to the prior exam. Mild associated left lung base atelectasis. 2. No evidence of pneumonia and no pulmonary edema. Electronically Signed   By: Lajean Manes M.D.   On: 10/26/2019 14:44   ECHOCARDIOGRAM COMPLETE  Result Date: 10/27/2019    ECHOCARDIOGRAM REPORT   Patient Name:   Nancy Bush Date of Exam: 10/27/2019 Medical Rec #:  [id number]          Height:       56.0 in Accession #:    [id number]         Weight:       104.1 lb Date of Birth:  September 22, 1925         BSA:          1.345 m Patient Age:    86 years           BP:           110/50 mmHg Patient Gender: F  HR:           60 bpm. Exam Location:  Inpatient Procedure: 2D Echo, Cardiac Doppler and Color Doppler Indications:    CHF-Acute Diastolic [id number]  History:        Patient has prior history of Echocardiogram examinations, most                 recent 05/20/2018. CHF, Pacemaker, COPD and Pulmonary HTN,                 Mitral Valve Disease, Arrythmias:Atrial Fibrillation, Atrial                 Flutter and LBBB, Signs/Symptoms:Chest Pain, Shortness of Breath                 and Hypotension; Risk Factors:Non-Smoker. Pleural effusion.                  Mitral Valve: 27 mm bioprosthetic valve valve is present in the                 mitral position.  Procedure Date: 05/18/2012.  Sonographer:    Vickie Epley RDCS Referring Phys: [id number] [place]  1. Normal LV systolic function; mild LAE; s/p MVR with mean gradient of 6 mmHg and MVA 1.6 cm2; no MR; severe RAE; moderate RVE; severe TR; severe pulmonary hypertension.  2. Left ventricular ejection fraction, by estimation, is 60 to 65%. The left ventricle has normal function. The left ventricle has no regional wall motion abnormalities. Left ventricular diastolic parameters are indeterminate.  3. Right ventricular systolic function is normal. The right ventricular size is moderately enlarged. There is severely elevated pulmonary artery systolic pressure.  4. Left atrial size was mildly dilated.  5. Right atrial size was severely dilated.  6. The mitral valve is normal in structure. No evidence of mitral valve regurgitation. No evidence of mitral stenosis. There is a 27 mm bioprosthetic valve present in the mitral position. Procedure Date: 05/18/2012.  7. Large pleural effusion in the left lateral region.  8. Tricuspid valve regurgitation is severe.  9. The aortic valve is tricuspid. Aortic valve regurgitation is not visualized. Mild aortic valve sclerosis is present, with no evidence of aortic valve stenosis. 10. The inferior vena cava is dilated in size with <50% respiratory variability, suggesting right atrial pressure of 15 mmHg. FINDINGS  Left Ventricle: Left ventricular ejection fraction, by estimation, is 60 to 65%. The left ventricle has normal function. The left ventricle has no regional wall motion abnormalities. The left ventricular internal cavity size was normal in size. There is  no left ventricular hypertrophy. Left ventricular diastolic parameters are indeterminate. Right Ventricle: The right ventricular size is moderately enlarged.Right ventricular systolic function is normal. There is severely elevated pulmonary artery systolic pressure. The tricuspid regurgitant velocity is 4.21 m/s,  and with an assumed right atrial pressure of 15 mmHg, the estimated right ventricular systolic pressure is 0000000 mmHg. Left Atrium: Left atrial size was mildly dilated. Right Atrium: Right atrial size was severely dilated. Pericardium: There is no evidence of pericardial effusion. Mitral Valve: The mitral valve is normal in structure. Normal mobility of the mitral valve leaflets. No evidence of mitral valve regurgitation. There is a 27 mm bioprosthetic valve present in the mitral position. Procedure Date: 05/18/2012. No evidence of mitral valve stenosis. MV peak gradient, 19.0 mmHg. The mean mitral valve gradient is 6.0 mmHg. Tricuspid Valve: The tricuspid valve is normal in structure. Tricuspid valve regurgitation is  severe. No evidence of tricuspid stenosis. Aortic Valve: The aortic valve is tricuspid. Aortic valve regurgitation is not visualized. Mild aortic valve sclerosis is present, with no evidence of aortic valve stenosis. Pulmonic Valve: The pulmonic valve was normal in structure. Pulmonic valve regurgitation is trivial. No evidence of pulmonic stenosis. Aorta: The aortic root is normal in size and structure. Venous: The inferior vena cava is dilated in size with less than 50% respiratory variability, suggesting right atrial pressure of 15 mmHg. IAS/Shunts: No atrial level shunt detected by color flow Doppler. Additional Comments: Normal LV systolic function; mild LAE; s/p MVR with mean gradient of 6 mmHg and MVA 1.6 cm2; no MR; severe RAE; moderate RVE; severe TR; severe pulmonary hypertension. A pacer wire is visualized. There is a large pleural effusion in the left lateral region.  LEFT VENTRICLE PLAX 2D LVIDd:         3.30 cm LVIDs:         2.30 cm LV PW:         1.00 cm LV IVS:        0.80 cm LVOT diam:     1.70 cm LV SV:         40 LV SV Index:   30 LVOT Area:     2.27 cm  RIGHT VENTRICLE RV S prime:     5.22 cm/s TAPSE (M-mode): 1.0 cm LEFT ATRIUM           Index       RIGHT ATRIUM           Index LA  diam:      3.90 cm 2.90 cm/m  RA Area:     34.70 cm LA Vol (A2C): 37.8 ml 28.11 ml/m RA Volume:   149.00 ml 110.80 ml/m LA Vol (A4C): 49.5 ml 36.81 ml/m  AORTIC VALVE LVOT Vmax:   82.20 cm/s LVOT Vmean:  51.800 cm/s LVOT VTI:    0.176 m  AORTA Ao Root diam: 2.40 cm MITRAL VALVE              TRICUSPID VALVE MV Area (PHT): 1.59 cm   TR Peak grad:   70.9 mmHg MV Peak grad:  19.0 mmHg  TR Vmax:        421.00 cm/s MV Mean grad:  6.0 mmHg MV Vmax:       2.18 m/s   SHUNTS MV Vmean:      104.0 cm/s Systemic VTI:  0.18 m                           Systemic Diam: 1.70 cm Kirk Ruths MD Electronically signed by Kirk Ruths MD Signature Date/Time: 10/27/2019/11:55:46 AM    Final     Assessment and Plan:   Severe pulmonary hypertension, severe TR: likely also with component of diastolic heart failure -strict I/O, daily weights. No outs charted. Weight today 47.2 kg -salt avoidance, fluid restriction -agree with IV lasix BID, but need to be cautious. She only has minimal edema. Risk of intravascularly depleting while pleural effusions remain -echo as above -guidelines do not recommend isolated right sided valvular lesions for surgical management, especially in high risk patients. This would also not fix the underlying severe pulmonary hypertension. However, right sided failure does not typically result in pleural effusions, so there is also likely a component of LV diastolic failure vs. Other etiology for her effusion -main strategy is volume management, will always have prominent TR pulsation  on exam -BNP 632  Paroxysmal atrial fibrillation -CHA2DS2/VAS Stroke Risk Points= 7  -on reduced dose apixaban given age, weight -on metoprolol succinate -AV paced  Prior bioprosthetic MVR: functioning well on echo  We will follow with you.  For questions or updates, please contact [place] Please consult [email or web address] for contact info under   Signed, Buford Dresser, MD  10/27/2019 1:40  PM

## 2019-10-27 NOTE — Progress Notes (Signed)
Initial Nutrition Assessment  DOCUMENTATION CODES:   Not applicable  INTERVENTION:  - will order Dillard [place] 1.4 orally once/day, each 325 ml supplement provides 425 kcal and 20 grams protein. - will order Magic Cup with dinner meals, each supplement provides 290 kcal and 9 grams of protein. - continue to encourage PO intakes.    NUTRITION DIAGNOSIS:   Increased nutrient needs related to acute illness as evidenced by estimated needs.  GOAL:   Patient will meet greater than or equal to 90% of their needs  MONITOR:   PO intake, Supplement acceptance, Labs, Weight trends  REASON FOR ASSESSMENT:   Malnutrition Screening Tool  ASSESSMENT:   86 y.o. female with medical history significant of  A. fib s/p maze operation on Eliquis, HTN, severe mitral valve regurgitation s/p mitral valve replacement, severe TR being managed conservatively, chronic heart failure, and symptomatic bradycardia s/p pacemaker placement. She presented to the ED from ALF due to worsening SOB. She was found to be hypoxic in the 80s even with minimal ambulation. CXR showed moderate L pleural effusion but no evidence of PNA.  She was admitted for CHF exacerbation and started on IV Lasix.  Patient consumed 100% of breakfast and ~50% of lunch today. Her appetite was at baseline until a few days PTA when she noticed decreased intakes related to shortness of breath. Since admission, breathing has improved and appetite and intakes are back to baseline.   She does not have any swallowing difficulties and does not have overt chewing difficulties, but favors softer, easier to chew items when they are available.   She states that she had lost weight within the past 4-5 months but started to gain weight back over the past 2 months. Per chart review, weight today is 104 lb and this weight is consistent with weight since 06/11/18. Of note, she has moderate edema to BLE so current weight may be falsely higher. Will monitor  closely.  Per notes: - acute on chronic heart failure--IV lasix ordered, fluid restriction, daily weights - severe pulmonary HTN - pancytopenia - generalized deconditioning - plan for patient to return to ALF at the time of d/c   Labs reviewed; Ca: 8.5 mg/dl, GFR: 54 ml/min. Medications reviewed; 40 mg IV lasix BID, 1 packet miralax/day, 2.5 mg deltasone/day.    NUTRITION - FOCUSED PHYSICAL EXAM:  completed; no muscle or fat wasting, moderate edema to BLE.   Diet Order:   Diet Order            Diet Heart Room service appropriate? Yes; Fluid consistency: Thin; Fluid restriction: 1500 mL Fluid  Diet effective now              EDUCATION NEEDS:   Not appropriate for education at this time  Skin:  Skin Assessment: Reviewed RN Assessment  Last BM:  3/25  Height:   Ht Readings from Last 1 Encounters:  10/26/19 4\' 8"  (1.422 m)    Weight:   Wt Readings from Last 1 Encounters:  10/27/19 47.2 kg    Ideal Body Weight:  40.7 kg  BMI:  Body mass index is 23.33 kg/m.  Estimated Nutritional Needs:   Kcal:  1250-1500 kcal  Protein:  55-70 grams  Fluid:  >/= 1.5 L/day     Jarome Matin, MS, RD, LDN, CNSC Inpatient Clinical Dietitian RD pager # available in AMION  After hours/weekend pager # available in [facility]

## 2019-10-28 ENCOUNTER — Inpatient Hospital Stay (HOSPITAL_COMMUNITY): Payer: Medicare Other

## 2019-10-28 DIAGNOSIS — Z515 Encounter for palliative care: Secondary | ICD-10-CM

## 2019-10-28 DIAGNOSIS — I5033 Acute on chronic diastolic (congestive) heart failure: Secondary | ICD-10-CM

## 2019-10-28 DIAGNOSIS — I509 Heart failure, unspecified: Secondary | ICD-10-CM

## 2019-10-28 DIAGNOSIS — Z7189 Other specified counseling: Secondary | ICD-10-CM

## 2019-10-28 LAB — BASIC METABOLIC PANEL
Anion gap: 10 (ref 5–15)
BUN: 13 mg/dL (ref 8–23)
CO2: 26 mmol/L (ref 22–32)
Calcium: 8.5 mg/dL — ABNORMAL LOW (ref 8.9–10.3)
Chloride: 99 mmol/L (ref 98–111)
Creatinine, Ser: 0.88 mg/dL (ref 0.44–1.00)
GFR calc Af Amer: >60 mL/min (ref >60)
GFR calc non Af Amer: 57 mL/min — ABNORMAL LOW (ref >60)
Glucose, Bld: 97 mg/dL (ref 70–99)
Potassium: 3.6 mmol/L (ref 3.5–5.1)
Sodium: 135 mmol/L (ref 135–145)

## 2019-10-28 LAB — MAGNESIUM: Magnesium: 2 mg/dL (ref 1.7–2.4)

## 2019-10-28 NOTE — Progress Notes (Signed)
Patient ID: Nancy Bush, female   DOB: 04/25/1926, 86 y.o.   MRN: [id number]  PROGRESS NOTE    Nancy Bush  [id number] DOB: 1926/03/23 DOA: 10/26/2019 PCP: Lawerance Cruel, MD   Brief Narrative:  86 y.o. female with medical history significant of paroxysmal A. fib status post maze operation on Eliquis, hypertension, severe mitral valve regurgitation status post mitral valve replacement, severe TR being managed conservatively, chronic diastolic heart failure, symptomatic bradycardia status post pacemaker placement presented from assisted living facility today for worsening shortness of breath. She was found to be hypoxic in the 80s even with minimal ambulation.  Chest x-ray showed moderate left pleural effusion but no evidence of pneumonia.  She was admitted for CHF exacerbation and started on IV Lasix.   Assessment & Plan:   Acute on chronic diastolic heart failure Moderate left-sided pleural effusion -Presented with worsening shortness of breath and bilateral leg swelling. -Takes Lasix 20 mg at home -Currently on Lasix 40 mg IV every 12 hours.  Continue strict input and output.  Daily weights.  Fluid restriction.  Acute urine output has not been charted.  2D echo showed EF of 60 to 65% with severe TR, severe pulmonary hypertension and large pleural effusion in the left lateral region -Cardiology following -Continue low-dose beta-blocker. -We will check x-ray including lateral decubitus x-ray to see if the patient would need any thoracentesis.  Will try and avoid thoracentesis as much as possible as patient is currently on room air and not tachypneic.  History of severe mitral valve regurgitation status post MVR History of severe TR -2D echo as above. Patient follows up with cardiology as an outpatient and is being managed conservatively -Follow cardiology recommendations  Paroxysmal A. Fib -With history of maze procedure.  Currently rate controlled.  Continue  metoprolol.  Continue Eliquis  Severe pulmonary hypertension  -Being managed medically.    Follow cardiology recommendations.  Chronic kidney disease stage III -Creatinine stable.  Monitor  Hypertension -Blood pressure stable.  Continue metoprolol and Lasix.  Pancytopenia Anemia of chronic disease: Likely from chronic any disease -Questionable cause.  No signs of bleeding.  Monitor   Generalized deconditioning -PT recommends home health PT and think that patient will be able to return to ALF.  Will request care management consult for discharge planning. -Palliative care evaluation for goals of care is still pending as patient has severe pulmonary hypertension/severe TR being managed medically    DVT prophylaxis: Eliquis Code Status: DNR Family Communication: Spoke to niece/Carolyn on phone on 10/28/2019 Disposition Plan: Probably back to assisted living facility in 1-2 days after patient improved symptomatically and is transitioned to oral Lasix by cardiology. Consultants: Cardiology  Procedures:  Echo   1. Normal LV systolic function; mild LAE; s/p MVR with mean gradient of 6  mmHg and MVA 1.6 cm2; no MR; severe RAE; moderate RVE; severe TR; severe  pulmonary hypertension.  2. Left ventricular ejection fraction, by estimation, is 60 to 65%. The  left ventricle has normal function. The left ventricle has no regional  wall motion abnormalities. Left ventricular diastolic parameters are  indeterminate.  3. Right ventricular systolic function is normal. The right ventricular  size is moderately enlarged. There is severely elevated pulmonary artery  systolic pressure.  4. Left atrial size was mildly dilated.  5. Right atrial size was severely dilated.  6. The mitral valve is normal in structure. No evidence of mitral valve  regurgitation. No evidence of mitral stenosis. There is a 27  mm  bioprosthetic valve present in the mitral position. Procedure Date:  05/18/2012.    7. Large pleural effusion in the left lateral region.  8. Tricuspid valve regurgitation is severe.  9. The aortic valve is tricuspid. Aortic valve regurgitation is not  visualized. Mild aortic valve sclerosis is present, with no evidence of  aortic valve stenosis.  10. The inferior vena cava is dilated in size with <50% respiratory  variability, suggesting right atrial pressure of 15 mmHg.   Antimicrobials: None   Subjective: Patient seen and examined bedside.  She is a poor historian.  Feels that her leg swelling is improving but still short of breath with minimal exertion.  No chest pains, vomiting or fevers.  Objective: Vitals:   10/27/19 1442 10/27/19 1831 10/27/19 2025 10/28/19 0559  BP: (!) 117/55 116/61 (!) 105/52 (!) 107/52  Pulse: 63 61 62 63  Resp: 18 (!) 21  18  Temp: 98.4 F (36.9 C) 98.1 F (36.7 C) 97.7 F (36.5 C) 97.9 F (36.6 C)  TempSrc:  Oral Oral   SpO2: 96% 95% 97% 100%  Weight:    47.9 kg  Height:        Intake/Output Summary (Last 24 hours) at 10/28/2019 1020 Last data filed at 10/28/2019 0940 Gross per 24 hour  Intake --  Output 575 ml  Net -575 ml   Filed Weights   10/26/19 1348 10/27/19 0500 10/28/19 0559  Weight: 47.2 kg 47.2 kg 47.9 kg    Examination:  General exam: No distress.  Chronically ill looking elderly female lying in bed.  Poor historian. Respiratory system: Bilateral decreased breath sounds at bases with bibasilar crackles  cardiovascular system: Rate controlled, S1-S2 heard Gastrointestinal system: Abdomen is nondistended, soft and nontender.  Bowel sounds are heard  extremities: No cyanosis, clubbing; 1+ lower extremity edema present Central nervous system: Awake and alert, poor historian.  No focal neurological deficits. Moving extremities Skin: No ulcers or rashes present. Psychiatry: Extremely flat affect.  Does not participate in conversation much.    Data Reviewed: I have personally reviewed following labs and  imaging studies  CBC: Recent Labs  Lab 10/26/19 1518 10/27/19 0424  WBC 3.0* 2.8*  HGB 9.1* 9.3*  HCT 30.6* 29.8*  MCV 94.4 92.5  PLT 97* 91*   Basic Metabolic Panel: Recent Labs  Lab 10/26/19 1518 10/27/19 0424 10/28/19 0412  NA 135 136 135  K 4.2 3.5 3.6  CL 97* 100 99  CO2 26 28 26   GLUCOSE 123* 109* 97  BUN 15 15 13   CREATININE 0.88 0.92 0.88  CALCIUM 8.6* 8.5* 8.5*  MG  --  2.0 2.0   GFR: Estimated Creatinine Clearance: 26.9 mL/min (by C-G formula based on SCr of 0.88 mg/dL). Liver Function Tests: Recent Labs  Lab 10/27/19 0424  AST 19  ALT 15  ALKPHOS 58  BILITOT 0.9  PROT 6.3*  ALBUMIN 3.6   No results for input(s): LIPASE, AMYLASE in the last 168 hours. No results for input(s): AMMONIA in the last 168 hours. Coagulation Profile: No results for input(s): INR, PROTIME in the last 168 hours. Cardiac Enzymes: No results for input(s): CKTOTAL, CKMB, CKMBINDEX, TROPONINI in the last 168 hours. BNP (last 3 results) No results for input(s): PROBNP in the last 8760 hours. HbA1C: No results for input(s): HGBA1C in the last 72 hours. CBG: No results for input(s): GLUCAP in the last 168 hours. Lipid Profile: No results for input(s): CHOL, HDL, LDLCALC, TRIG, CHOLHDL, LDLDIRECT in the last  72 hours. Thyroid Function Tests: No results for input(s): TSH, T4TOTAL, FREET4, T3FREE, THYROIDAB in the last 72 hours. Anemia Panel: No results for input(s): VITAMINB12, FOLATE, FERRITIN, TIBC, IRON, RETICCTPCT in the last 72 hours. Sepsis Labs: No results for input(s): PROCALCITON, LATICACIDVEN in the last 168 hours.  Recent Results (from the past 240 hour(s))  SARS CORONAVIRUS 2 (TAT 6-24 HRS) Nasopharyngeal Nasopharyngeal Swab     Status: None   Collection Time: 10/26/19  6:01 PM   Specimen: Nasopharyngeal Swab  Result Value Ref Range Status   SARS Coronavirus 2 NEGATIVE NEGATIVE Final    Comment: (NOTE) SARS-CoV-2 target nucleic acids are NOT DETECTED. The  SARS-CoV-2 RNA is generally detectable in upper and lower respiratory specimens during the acute phase of infection. Negative results do not preclude SARS-CoV-2 infection, do not rule out co-infections with other pathogens, and should not be used as the sole basis for treatment or other patient management decisions. Negative results must be combined with clinical observations, patient history, and epidemiological information. The expected result is Negative. Fact Sheet for Patients: SugarRoll.be Fact Sheet for Healthcare Providers: https://www.woods-mathews.com/ This test is not yet approved or cleared by the [place] FDA and  has been authorized for detection and/or diagnosis of SARS-CoV-2 by FDA under an Emergency Use Authorization (EUA). This EUA will remain  in effect (meaning this test can be used) for the duration of the COVID-19 declaration under Section 56 4(b)(1) of the Act, 21 U.S.C. section 360bbb-3(b)(1), unless the authorization is terminated or revoked sooner. Performed at [facility], [place] [address], [place], [place] [address]          Radiology Studies: DG Chest Portable 1 View  Result Date: 10/26/2019 CLINICAL DATA:  Leg swelling increasing for a few days. Also with leg pain. EXAM: PORTABLE CHEST 1 VIEW COMPARISON:  08/26/2019 FINDINGS: Stable changes from prior cardiac surgery and valve replacement cardiac silhouette is mildly enlarged. No mediastinal or hilar masses. Moderate left pleural effusion obscures portions of the left heart border and the left hemidiaphragm. Possible small right pleural effusion. Is mild opacity at the left lung base adjacent to the pleural effusion consistent with atelectasis. Remainder of the lungs is clear. No pneumothorax. Left anterior chest wall dual lead pacemaker is stable. Skeletal structures are grossly intact. IMPRESSION: 1. Moderate left pleural effusion which has increased  compared to the prior exam. Mild associated left lung base atelectasis. 2. No evidence of pneumonia and no pulmonary edema. Electronically Signed   By: Lajean Manes M.D.   On: 10/26/2019 14:44   ECHOCARDIOGRAM COMPLETE  Result Date: 10/27/2019    ECHOCARDIOGRAM REPORT   Patient Name:   Nancy Bush Date of Exam: 10/27/2019 Medical Rec #:  [id number]          Height:       56.0 in Accession #:    [id number]         Weight:       104.1 lb Date of Birth:  Dec 26, 1925         BSA:          1.345 m Patient Age:    39 years           BP:           110/50 mmHg Patient Gender: F                  HR:           60 bpm. Exam  Location:  Inpatient Procedure: 2D Echo, Cardiac Doppler and Color Doppler Indications:    CHF-Acute Diastolic [id number]  History:        Patient has prior history of Echocardiogram examinations, most                 recent 05/20/2018. CHF, Pacemaker, COPD and Pulmonary HTN,                 Mitral Valve Disease, Arrythmias:Atrial Fibrillation, Atrial                 Flutter and LBBB, Signs/Symptoms:Chest Pain, Shortness of Breath                 and Hypotension; Risk Factors:Non-Smoker. Pleural effusion.                  Mitral Valve: 27 mm bioprosthetic valve valve is present in the                 mitral position. Procedure Date: 05/18/2012.  Sonographer:    Vickie Epley RDCS Referring Phys: [id number] [place]  1. Normal LV systolic function; mild LAE; s/p MVR with mean gradient of 6 mmHg and MVA 1.6 cm2; no MR; severe RAE; moderate RVE; severe TR; severe pulmonary hypertension.  2. Left ventricular ejection fraction, by estimation, is 60 to 65%. The left ventricle has normal function. The left ventricle has no regional wall motion abnormalities. Left ventricular diastolic parameters are indeterminate.  3. Right ventricular systolic function is normal. The right ventricular size is moderately enlarged. There is severely elevated pulmonary artery systolic pressure.  4. Left atrial size  was mildly dilated.  5. Right atrial size was severely dilated.  6. The mitral valve is normal in structure. No evidence of mitral valve regurgitation. No evidence of mitral stenosis. There is a 27 mm bioprosthetic valve present in the mitral position. Procedure Date: 05/18/2012.  7. Large pleural effusion in the left lateral region.  8. Tricuspid valve regurgitation is severe.  9. The aortic valve is tricuspid. Aortic valve regurgitation is not visualized. Mild aortic valve sclerosis is present, with no evidence of aortic valve stenosis. 10. The inferior vena cava is dilated in size with <50% respiratory variability, suggesting right atrial pressure of 15 mmHg. FINDINGS  Left Ventricle: Left ventricular ejection fraction, by estimation, is 60 to 65%. The left ventricle has normal function. The left ventricle has no regional wall motion abnormalities. The left ventricular internal cavity size was normal in size. There is  no left ventricular hypertrophy. Left ventricular diastolic parameters are indeterminate. Right Ventricle: The right ventricular size is moderately enlarged.Right ventricular systolic function is normal. There is severely elevated pulmonary artery systolic pressure. The tricuspid regurgitant velocity is 4.21 m/s, and with an assumed right atrial pressure of 15 mmHg, the estimated right ventricular systolic pressure is 0000000 mmHg. Left Atrium: Left atrial size was mildly dilated. Right Atrium: Right atrial size was severely dilated. Pericardium: There is no evidence of pericardial effusion. Mitral Valve: The mitral valve is normal in structure. Normal mobility of the mitral valve leaflets. No evidence of mitral valve regurgitation. There is a 27 mm bioprosthetic valve present in the mitral position. Procedure Date: 05/18/2012. No evidence of mitral valve stenosis. MV peak gradient, 19.0 mmHg. The mean mitral valve gradient is 6.0 mmHg. Tricuspid Valve: The tricuspid valve is normal in structure.  Tricuspid valve regurgitation is severe. No evidence of tricuspid stenosis. Aortic Valve: The aortic valve is tricuspid. Aortic  valve regurgitation is not visualized. Mild aortic valve sclerosis is present, with no evidence of aortic valve stenosis. Pulmonic Valve: The pulmonic valve was normal in structure. Pulmonic valve regurgitation is trivial. No evidence of pulmonic stenosis. Aorta: The aortic root is normal in size and structure. Venous: The inferior vena cava is dilated in size with less than 50% respiratory variability, suggesting right atrial pressure of 15 mmHg. IAS/Shunts: No atrial level shunt detected by color flow Doppler. Additional Comments: Normal LV systolic function; mild LAE; s/p MVR with mean gradient of 6 mmHg and MVA 1.6 cm2; no MR; severe RAE; moderate RVE; severe TR; severe pulmonary hypertension. A pacer wire is visualized. There is a large pleural effusion in the left lateral region.  LEFT VENTRICLE PLAX 2D LVIDd:         3.30 cm LVIDs:         2.30 cm LV PW:         1.00 cm LV IVS:        0.80 cm LVOT diam:     1.70 cm LV SV:         40 LV SV Index:   30 LVOT Area:     2.27 cm  RIGHT VENTRICLE RV S prime:     5.22 cm/s TAPSE (M-mode): 1.0 cm LEFT ATRIUM           Index       RIGHT ATRIUM           Index LA diam:      3.90 cm 2.90 cm/m  RA Area:     34.70 cm LA Vol (A2C): 37.8 ml 28.11 ml/m RA Volume:   149.00 ml 110.80 ml/m LA Vol (A4C): 49.5 ml 36.81 ml/m  AORTIC VALVE LVOT Vmax:   82.20 cm/s LVOT Vmean:  51.800 cm/s LVOT VTI:    0.176 m  AORTA Ao Root diam: 2.40 cm MITRAL VALVE              TRICUSPID VALVE MV Area (PHT): 1.59 cm   TR Peak grad:   70.9 mmHg MV Peak grad:  19.0 mmHg  TR Vmax:        421.00 cm/s MV Mean grad:  6.0 mmHg MV Vmax:       2.18 m/s   SHUNTS MV Vmean:      104.0 cm/s Systemic VTI:  0.18 m                           Systemic Diam: 1.70 cm Kirk Ruths MD Electronically signed by Kirk Ruths MD Signature Date/Time: 10/27/2019/11:55:46 AM    Final          Scheduled Meds: . apixaban  2.5 mg Oral BID  . feeding supplement (KATE FARMS STANDARD 1.4)  325 mL Oral Q24H  . furosemide  40 mg Intravenous Q12H  . metoprolol succinate  12.5 mg Oral Daily  . montelukast  10 mg Oral Daily  . polyethylene glycol  17 g Oral Daily  . predniSONE  2.5 mg Oral QODAY   Continuous Infusions:        Aline August, MD [facility] 10/28/2019, 10:20 AM

## 2019-10-28 NOTE — TOC Progression Note (Addendum)
Transition of Care [facility]) - Progression Note    Patient Details  Name: Nancy Bush MRN: [id number] Date of Birth: 1926-03-24  Transition of [facility] CM/SW Contact  Purcell Mouton, RN Phone Number: 10/28/2019, 11:16 AM  Clinical Narrative:    A call to [place] at [facility] was made, spoke with Rip Harbour, RN who states facility will need discharge summary in order to get pt's medications, FL2, H&P faxed to [phone]. Pt may return to ALF. Pt will need DNR signed, on chart.         Expected Discharge Plan and Services                                                 Social Determinants of Health (SDOH) Interventions    Readmission Risk Interventions No flowsheet data found.

## 2019-10-28 NOTE — NC FL2 (Signed)
[place] MEDICAID FL2 LEVEL OF CARE SCREENING TOOL     IDENTIFICATION  Patient Name: Nancy Bush Birthdate: 11-27-1925 Sex: female Admission Date (Current Location): 10/26/2019  [facility] and [place] Number:  Herbalist and Address:  [facility],  [place], [place]      Provider Number: [id number]  Attending Physician Name and Address:  Aline August, MD  Relative Name and Phone Number:  Bebe Shaggy [phone] (niece)    Current Level of Care: Hospital Recommended Level of Care: [place] Prior Approval Number:    Date Approved/Denied:   PASRR Number:    Discharge Plan: Other (Comment)(ALF)    Current Diagnoses: Patient Active Problem List   Diagnosis Date Noted  . CHF exacerbation ([place]) 10/26/2019  . Educated about COVID-19 virus infection 03/20/2019  . Hypotension 03/18/2019  . S/P MVR (mitral valve repair) 03/18/2019  . Pancytopenia ([place]) 05/21/2018  . TGA (transient global amnesia) 05/19/2018  . Essential hypertension 12/11/2017  . Medication management 12/11/2017  . Pulmonary HTN ([place]) 12/30/2016  . Protein calorie malnutrition ([place]) 08/22/2012  . CAP (community acquired pneumonia) 08/21/2012  . Hypokalemia 08/21/2012  . SOB (shortness of breath) 08/20/2012  . FTT (failure to thrive) in adult 08/20/2012  . Leucocytosis 08/20/2012  . Atrial flutter ([place]) 06/24/2012  . CKD (chronic kidney disease), stage III 06/24/2012  . Pleural effusion 06/24/2012  . [employer] 05/25/2012  . Bradycardia 05/21/2012  . S/P mitral valve replacement with bioprosthetic valve 05/18/2012  . S/P Maze operation for atrial fibrillation 05/18/2012  . Severe mitral regurgitation 05/17/2012  . A-fib ([place]) 05/17/2012  . MR (mitral regurgitation) 05/03/2012  . Fatigue 01/29/2012  . Acute on chronic diastolic heart failure ([place]) 06/10/2011  . CKD (chronic kidney disease) 06/03/2011  . Encounter for  long-term (current) use of anticoagulants 05/26/2011  . Atrial fibrillation ([place]) 05/19/2011  . CHEST PAIN UNSPECIFIED 08/28/2010  . ABNORMAL ELECTROCARDIOGRAM 11/14/2009  . Mitral Regurgitation 11/12/2009  . PULMONARY HYPERTENSION 11/12/2009  . Mitral valve disorder 11/12/2009  . SINUSITIS, CHRONIC 06/12/2007  . ALLERGIC RHINITIS 06/12/2007  . BRONCHITIS 06/12/2007  . COPD 06/12/2007    Orientation RESPIRATION BLADDER Height & Weight     Self, Time, Situation, Place  Normal Continent Weight: 47.9 kg Height:  4\' 8"  (142.2 cm)  BEHAVIORAL SYMPTOMS/MOOD NEUROLOGICAL BOWEL NUTRITION STATUS      Continent Diet(Heart Healthy)  AMBULATORY STATUS COMMUNICATION OF NEEDS Skin   Limited Assist Verbally Normal                       Personal Care Assistance Level of Assistance  Bathing, Feeding, Dressing Bathing Assistance: Limited assistance Feeding assistance: Independent       Functional Limitations Info  Sight, Hearing, Speech Sight Info: Adequate Hearing Info: Impaired(HOH) Speech Info: Adequate    SPECIAL CARE FACTORS FREQUENCY                       Contractures Contractures Info: Not present    Additional Factors Info  Code Status, Allergies Code Status Info: DNR Allergies Info: Promethazine Hcl, Septra Sulfamethoxazole-trimethoprim, Sulfonamide Derivatives, Tequin           Current Medications (10/28/2019):  This is the current hospital active medication list Current Facility-Administered Medications  Medication Dose Route Frequency Provider Last Rate Last Admin  . acetaminophen (TYLENOL) tablet 650 mg  650 mg Oral Q6H PRN Aline August, MD  Or  . acetaminophen (TYLENOL) suppository 650 mg  650 mg Rectal Q6H PRN Alekh, Kshitiz, MD      . albuterol (PROVENTIL) (2.5 MG/3ML) 0.083% nebulizer solution 3 mL  3 mL Inhalation Q8H PRN Alekh, Kshitiz, MD      . apixaban (ELIQUIS) tablet 2.5 mg  2.5 mg Oral BID Aline August, MD   2.5 mg at 10/28/19 0934   . feeding supplement (KATE FARMS STANDARD 1.4) liquid 325 mL  325 mL Oral Q24H Alekh, Kshitiz, MD   325 mL at 10/28/19 0934  . furosemide (LASIX) injection 40 mg  40 mg Intravenous Q12H Aline August, MD   40 mg at 10/28/19 0507  . metoprolol succinate (TOPROL-XL) 24 hr tablet 12.5 mg  12.5 mg Oral Daily Starla Link, Kshitiz, MD   12.5 mg at 10/28/19 0934  . montelukast (SINGULAIR) tablet 10 mg  10 mg Oral Daily Aline August, MD   10 mg at 10/28/19 0934  . nitroGLYCERIN (NITROSTAT) SL tablet 0.4 mg  0.4 mg Sublingual Q5 min PRN Starla Link, Kshitiz, MD      . ondansetron (ZOFRAN) tablet 4 mg  4 mg Oral Q6H PRN Alekh, Kshitiz, MD       Or  . ondansetron (ZOFRAN) injection 4 mg  4 mg Intravenous Q6H PRN Alekh, Kshitiz, MD      . polyethylene glycol (MIRALAX / GLYCOLAX) packet 17 g  17 g Oral Daily Aline August, MD   17 g at 10/28/19 0934  . predniSONE (DELTASONE) tablet 2.5 mg  2.5 mg Oral Andrey Farmer, Kshitiz, MD   2.5 mg at 10/27/19 1058  . senna-docusate (Senokot-S) tablet 1 tablet  1 tablet Oral QHS PRN Aline August, MD         Discharge Medications: Please see discharge summary for a list of discharge medications.  Relevant Imaging Results:  Relevant Lab Results:   Additional Information [id number]  Purcell Mouton, RN

## 2019-10-28 NOTE — Progress Notes (Signed)
Physical Therapy Treatment Patient Details Name: Nancy Bush MRN: [id number] DOB: 1926/06/12 Today's Date: 10/28/2019    History of Present Illness Pt admitted from ALF with FTT and SOB.  Pt with hx of COPD, a-fib, LBBB, RA, mitral valve replacement, pacemaker, and CHF  General Comments: AxO x 3 pleasant and smart.  Assisted OOB to amb in hallway.  General bed mobility comments: pt self able to get OOB and back into bed with increased time.  General transfer comment: good safety cognition and use of hands to steady self. General Gait Details: small steps, has been using quad cane since 2013, required 3-4 standing rest breaks due to fatigue.  Pt self aware.  "I need to stop and rest". Assisted back to bed.    PT Comments      Follow Up Recommendations  Home health PT(at ALF)     Equipment Recommendations  None recommended by PT    Recommendations for Other Services       Precautions / Restrictions Precautions Precautions: Fall Restrictions Weight Bearing Restrictions: No    Mobility  Bed Mobility Overal bed mobility: Needs Assistance Bed Mobility: Supine to Sit;Sit to Supine     Supine to sit: Supervision Sit to supine: Supervision   General Comments: AxO x 3 pleasant and smart General bed mobility comments: pt self able to get OOB and back into bed with increased time  Transfers Overall transfer level: Needs assistance Equipment used: Quad cane Transfers: Sit to/from [facility] Sit to Stand: Supervision Stand pivot transfers: Supervision;Min guard       General transfer comment: good safety cognition and use of hands to steady self.  Ambulation/Gait Ambulation/Gait assistance: Supervision;Min guard Gait Distance (Feet): 85 Feet Assistive device: Quad cane Gait Pattern/deviations: Step-through pattern;Decreased stride length Gait velocity: decreased   General Gait Details: small steps, has been using quad cane since 2013, required 3-4  standing rest breaks due to fatigue.  Pt self aware.  "I need to stop and rest".   Stairs             Wheelchair Mobility    Modified Rankin (Stroke Patients Only)       Balance                                            Cognition Arousal/Alertness: Awake/alert Behavior During Therapy: WFL for tasks assessed/performed Overall Cognitive Status: Within Functional Limits for tasks assessed                                 General Comments: AxO x 3 pleasant and smart      Exercises      General Comments        Pertinent Vitals/Pain Pain Assessment: No/denies pain    Home Living                      Prior Function            PT Goals (current goals can now be found in the care plan section) Progress towards PT goals: Progressing toward goals    Frequency    Min 3X/week      PT Plan Current plan remains appropriate    Co-evaluation              AM-PAC  PT "6 Clicks" Mobility   Outcome Measure  Help needed turning from your back to your side while in a flat bed without using bedrails?: None Help needed moving from lying on your back to sitting on the side of a flat bed without using bedrails?: None Help needed moving to and from a bed to a chair (including a wheelchair)?: None Help needed standing up from a chair using your arms (e.g., wheelchair or bedside chair)?: A Little Help needed to walk in hospital room?: A Little Help needed climbing 3-5 steps with a railing? : A Little 6 Click Score: 21    End of Session Equipment Utilized During Treatment: Gait belt Activity Tolerance: Patient tolerated treatment well Patient left: in bed;with call bell/phone within reach   PT Visit Diagnosis: Muscle weakness (generalized) (M62.81);Difficulty in walking, not elsewhere classified (R26.2)     Time: [id number] PT Time Calculation (min) (ACUTE ONLY): 26 min  Charges:  $Gait Training: 8-22  mins $Therapeutic Activity: 8-22 mins                     Rica Koyanagi  PTA Acute  Rehabilitation Services Pager      [phone] Office      [phone]

## 2019-10-28 NOTE — Progress Notes (Signed)
Assumed care of this patient from [facility] at 0100. Agree with previous nurses assessment, will continue to assess per policy.

## 2019-10-28 NOTE — Progress Notes (Signed)
Progress Note  Patient Name: Nancy Bush Date of Encounter: 10/28/2019  Primary Cardiologist: Minus Breeding, MD   Subjective   Resting comfortably in bed. Breathing is short when she tries to ambulate. Asking how long she will be in the hospital.  Inpatient Medications    Scheduled Meds: . apixaban  2.5 mg Oral BID  . feeding supplement (KATE [employer]ARMS STANDARD 1.4)  325 mL Oral Q24H  . furosemide  40 mg Intravenous Q12H  . metoprolol succinate  12.5 mg Oral Daily  . montelukast  10 mg Oral Daily  . polyethylene glycol  17 g Oral Daily  . predniSONE  2.5 mg Oral QODAY   Continuous Infusions:  PRN Meds: acetaminophen **OR** acetaminophen, albuterol, nitroGLYCERIN, ondansetron **OR** ondansetron (ZOFRAN) IV, senna-docusate   Vital Signs    Vitals:   10/27/19 1831 10/27/19 2025 10/28/19 0559 10/28/19 1152  BP: 116/61 (!) 105/52 (!) 107/52 (!) 105/49  Pulse: 61 62 63 60  Resp: (!) 21  18 15   Temp: 98.1 F (36.7 C) 97.7 F (36.5 C) 97.9 F (36.6 C) (!) 97.4 F (36.3 C)  TempSrc: Oral Oral  Oral  SpO2: 95% 97% 100% 96%  Weight:   47.9 kg   Height:        Intake/Output Summary (Last 24 hours) at 10/28/2019 1353 Last data filed at 10/28/2019 1021 Gross per 24 hour  Intake 360 ml  Output 575 ml  Net -215 ml   Last 3 Weights 10/28/2019 10/27/2019 10/26/2019  Weight (lbs) 105 lb 9.6 oz 104 lb 0.9 oz 104 lb  Weight (kg) 47.9 kg 47.2 kg 47.174 kg      Telemetry    Atrial-paced, ventricle-paced rhythm with occasional PVCs - Personally Reviewed  ECG    Atrial-paced, ventricular-paced rhythm - Personally Reviewed  Physical Exam   GEN: No acute distress. Frail appearing   Neck: TR pulsation to earlobe Cardiac: RRR, 3/6 SM Respiratory: Diminished breath sounds in bilateral bases, L>R. Upper airway crackles. GI: Soft, nontender, non-distended  MS: trivial bilatral LE edema; No deformity. Neuro:  Nonfocal, very hard of hearing Psych: Normal affect   Labs      High Sensitivity Troponin:   Recent Labs  Lab 10/26/19 1518  TROPONINIHS 7      Chemistry Recent Labs  Lab 10/26/19 1518 10/27/19 0424 10/28/19 0412  NA 135 136 135  K 4.2 3.5 3.6  CL 97* 100 99  CO2 26 28 26   GLUCOSE 123* 109* 97  BUN 15 15 13   CREATININE 0.88 0.92 0.88  CALCIUM 8.6* 8.5* 8.5*  PROT  --  6.3*  --   ALBUMIN  --  3.6  --   AST  --  19  --   ALT  --  15  --   ALKPHOS  --  58  --   BILITOT  --  0.9  --   GFRNONAA 57* 54* 57*  GFRAA >60 >60 >60  ANIONGAP 12 8 10      Hematology Recent Labs  Lab 10/26/19 1518 10/27/19 0424  WBC 3.0* 2.8*  RBC 3.24* 3.22*  HGB 9.1* 9.3*  HCT 30.6* 29.8*  MCV 94.4 92.5  MCH 28.1 28.9  MCHC 29.7* 31.2  RDW 14.5 14.5  PLT 97* 91*    BNP Recent Labs  Lab 10/26/19 1518  BNP 632.3*     DDimer No results for input(s): DDIMER in the last 168 hours.   Radiology    DG Chest 2 View  Result  Date: 10/28/2019 CLINICAL DATA:  Atrial fibrillation, mitral regurgitation, worsening shortness of breath, pleural effusions EXAM: CHEST - 2 VIEW; CHEST - BILATERAL DECUBITUS VIEW COMPARISON:  10/26/2019 FINDINGS: Frontal, lateral, and bilateral decubitus views of the chest are obtained. Dual lead pacemaker and mitral valve prosthesis unchanged. Cardiac silhouette is stable. Persistent bilateral pleural effusions with bibasilar consolidation, left greater than right. No pneumothorax. No acute bony abnormalities. Bilateral decubitus radiographs demonstrates small free-flowing bilateral pleural effusions, left greater than right. Volume estimated less than 500 cc bilaterally. Persistent blunting of the costophrenic angles in the non dependent portions of the lungs consistent with small loculated component of effusions versus pleural thickening. IMPRESSION: 1. Free-flowing bilateral pleural effusions, left greater than right, volume estimated less than 500 cc each. Electronically Signed   By: Randa Ngo M.D.   On: 10/28/2019 12:10    DG Chest Bilateral Decubitus  Result Date: 10/28/2019 CLINICAL DATA:  Atrial fibrillation, mitral regurgitation, worsening shortness of breath, pleural effusions EXAM: CHEST - 2 VIEW; CHEST - BILATERAL DECUBITUS VIEW COMPARISON:  10/26/2019 FINDINGS: Frontal, lateral, and bilateral decubitus views of the chest are obtained. Dual lead pacemaker and mitral valve prosthesis unchanged. Cardiac silhouette is stable. Persistent bilateral pleural effusions with bibasilar consolidation, left greater than right. No pneumothorax. No acute bony abnormalities. Bilateral decubitus radiographs demonstrates small free-flowing bilateral pleural effusions, left greater than right. Volume estimated less than 500 cc bilaterally. Persistent blunting of the costophrenic angles in the non dependent portions of the lungs consistent with small loculated component of effusions versus pleural thickening. IMPRESSION: 1. Free-flowing bilateral pleural effusions, left greater than right, volume estimated less than 500 cc each. Electronically Signed   By: Randa Ngo M.D.   On: 10/28/2019 12:10   DG Chest Portable 1 View  Result Date: 10/26/2019 CLINICAL DATA:  Leg swelling increasing for a few days. Also with leg pain. EXAM: PORTABLE CHEST 1 VIEW COMPARISON:  08/26/2019 FINDINGS: Stable changes from prior cardiac surgery and valve replacement cardiac silhouette is mildly enlarged. No mediastinal or hilar masses. Moderate left pleural effusion obscures portions of the left heart border and the left hemidiaphragm. Possible small right pleural effusion. Is mild opacity at the left lung base adjacent to the pleural effusion consistent with atelectasis. Remainder of the lungs is clear. No pneumothorax. Left anterior chest wall dual lead pacemaker is stable. Skeletal structures are grossly intact. IMPRESSION: 1. Moderate left pleural effusion which has increased compared to the prior exam. Mild associated left lung base atelectasis. 2.  No evidence of pneumonia and no pulmonary edema. Electronically Signed   By: Lajean Manes M.D.   On: 10/26/2019 14:44   ECHOCARDIOGRAM COMPLETE  Result Date: 10/27/2019    ECHOCARDIOGRAM REPORT   Patient Name:   Nancy Bush Date of Exam: 10/27/2019 Medical Rec #:  [id number]          Height:       56.0 in Accession #:    [id number]         Weight:       104.1 lb Date of Birth:  10-01-1925         BSA:          1.345 m Patient Age:    86 years           BP:           110/50 mmHg Patient Gender: F                  HR:  60 bpm. Exam Location:  Inpatient Procedure: 2D Echo, Cardiac Doppler and Color Doppler Indications:    CHF-Acute Diastolic [id number]  History:        Patient has prior history of Echocardiogram examinations, most                 recent 05/20/2018. CHF, Pacemaker, COPD and Pulmonary HTN,                 Mitral Valve Disease, Arrythmias:Atrial Fibrillation, Atrial                 Flutter and LBBB, Signs/Symptoms:Chest Pain, Shortness of Breath                 and Hypotension; Risk Factors:Non-Smoker. Pleural effusion.                  Mitral Valve: 27 mm bioprosthetic valve valve is present in the                 mitral position. Procedure Date: 05/18/2012.  Sonographer:    Vickie Epley RDCS Referring Phys: [id number] [place]  1. Normal LV systolic function; mild LAE; s/p MVR with mean gradient of 6 mmHg and MVA 1.6 cm2; no MR; severe RAE; moderate RVE; severe TR; severe pulmonary hypertension.  2. Left ventricular ejection fraction, by estimation, is 60 to 65%. The left ventricle has normal function. The left ventricle has no regional wall motion abnormalities. Left ventricular diastolic parameters are indeterminate.  3. Right ventricular systolic function is normal. The right ventricular size is moderately enlarged. There is severely elevated pulmonary artery systolic pressure.  4. Left atrial size was mildly dilated.  5. Right atrial size was severely dilated.  6. The  mitral valve is normal in structure. No evidence of mitral valve regurgitation. No evidence of mitral stenosis. There is a 27 mm bioprosthetic valve present in the mitral position. Procedure Date: 05/18/2012.  7. Large pleural effusion in the left lateral region.  8. Tricuspid valve regurgitation is severe.  9. The aortic valve is tricuspid. Aortic valve regurgitation is not visualized. Mild aortic valve sclerosis is present, with no evidence of aortic valve stenosis. 10. The inferior vena cava is dilated in size with <50% respiratory variability, suggesting right atrial pressure of 15 mmHg. FINDINGS  Left Ventricle: Left ventricular ejection fraction, by estimation, is 60 to 65%. The left ventricle has normal function. The left ventricle has no regional wall motion abnormalities. The left ventricular internal cavity size was normal in size. There is  no left ventricular hypertrophy. Left ventricular diastolic parameters are indeterminate. Right Ventricle: The right ventricular size is moderately enlarged.Right ventricular systolic function is normal. There is severely elevated pulmonary artery systolic pressure. The tricuspid regurgitant velocity is 4.21 m/s, and with an assumed right atrial pressure of 15 mmHg, the estimated right ventricular systolic pressure is 0000000 mmHg. Left Atrium: Left atrial size was mildly dilated. Right Atrium: Right atrial size was severely dilated. Pericardium: There is no evidence of pericardial effusion. Mitral Valve: The mitral valve is normal in structure. Normal mobility of the mitral valve leaflets. No evidence of mitral valve regurgitation. There is a 27 mm bioprosthetic valve present in the mitral position. Procedure Date: 05/18/2012. No evidence of mitral valve stenosis. MV peak gradient, 19.0 mmHg. The mean mitral valve gradient is 6.0 mmHg. Tricuspid Valve: The tricuspid valve is normal in structure. Tricuspid valve regurgitation is severe. No evidence of tricuspid stenosis.  Aortic Valve: The aortic valve  is tricuspid. Aortic valve regurgitation is not visualized. Mild aortic valve sclerosis is present, with no evidence of aortic valve stenosis. Pulmonic Valve: The pulmonic valve was normal in structure. Pulmonic valve regurgitation is trivial. No evidence of pulmonic stenosis. Aorta: The aortic root is normal in size and structure. Venous: The inferior vena cava is dilated in size with less than 50% respiratory variability, suggesting right atrial pressure of 15 mmHg. IAS/Shunts: No atrial level shunt detected by color flow Doppler. Additional Comments: Normal LV systolic function; mild LAE; s/p MVR with mean gradient of 6 mmHg and MVA 1.6 cm2; no MR; severe RAE; moderate RVE; severe TR; severe pulmonary hypertension. A pacer wire is visualized. There is a large pleural effusion in the left lateral region.  LEFT VENTRICLE PLAX 2D LVIDd:         3.30 cm LVIDs:         2.30 cm LV PW:         1.00 cm LV IVS:        0.80 cm LVOT diam:     1.70 cm LV SV:         40 LV SV Index:   30 LVOT Area:     2.27 cm  RIGHT VENTRICLE RV S prime:     5.22 cm/s TAPSE (M-mode): 1.0 cm LEFT ATRIUM           Index       RIGHT ATRIUM           Index LA diam:      3.90 cm 2.90 cm/m  RA Area:     34.70 cm LA Vol (A2C): 37.8 ml 28.11 ml/m RA Volume:   149.00 ml 110.80 ml/m LA Vol (A4C): 49.5 ml 36.81 ml/m  AORTIC VALVE LVOT Vmax:   82.20 cm/s LVOT Vmean:  51.800 cm/s LVOT VTI:    0.176 m  AORTA Ao Root diam: 2.40 cm MITRAL VALVE              TRICUSPID VALVE MV Area (PHT): 1.59 cm   TR Peak grad:   70.9 mmHg MV Peak grad:  19.0 mmHg  TR Vmax:        421.00 cm/s MV Mean grad:  6.0 mmHg MV Vmax:       2.18 m/s   SHUNTS MV Vmean:      104.0 cm/s Systemic VTI:  0.18 m                           Systemic Diam: 1.70 cm Kirk Ruths MD Electronically signed by Kirk Ruths MD Signature Date/Time: 10/27/2019/11:55:46 AM    Final     Cardiac Studies   Echo 10/27/19 1. Normal LV systolic function; mild LAE;  s/p MVR with mean gradient of 6  mmHg and MVA 1.6 cm2; no MR; severe RAE; moderate RVE; severe TR; severe  pulmonary hypertension.  2. Left ventricular ejection fraction, by estimation, is 60 to 65%. The  left ventricle has normal function. The left ventricle has no regional  wall motion abnormalities. Left ventricular diastolic parameters are  indeterminate.  3. Right ventricular systolic function is normal. The right ventricular  size is moderately enlarged. There is severely elevated pulmonary artery  systolic pressure.  4. Left atrial size was mildly dilated.  5. Right atrial size was severely dilated.  6. The mitral valve is normal in structure. No evidence of mitral valve  regurgitation. No evidence of mitral stenosis.  There is a 27 mm  bioprosthetic valve present in the mitral position. Procedure Date:  05/18/2012.  7. Large pleural effusion in the left lateral region.  8. Tricuspid valve regurgitation is severe.  9. The aortic valve is tricuspid. Aortic valve regurgitation is not  visualized. Mild aortic valve sclerosis is present, with no evidence of  aortic valve stenosis.  10. The inferior vena cava is dilated in size with <50% respiratory  variability, suggesting right atrial pressure of 15 mmHg.   Patient Profile     86 y.o. female with a hx of paroxysmal atrial fibrillation with prior MAZE, pulmonary hypertension, mitral regurgitation s/p bioprosthetic MVR, symptomatic bradycardia s/p PPM who is being seen for the evaluation of heart failure at the request of Dr. Starla Link.  Assessment & Plan    Severe pulmonary hypertension, severe TR: likely also with component of diastolic heart failure -strict I/O, daily weights. No outs charted. Weight today 47.9 kg, up from 47.2 kg on admission -salt avoidance, fluid restriction -agree with IV lasix BID, but need to be cautious. She only has minimal edema. Risk of intravascularly depleting while pleural effusions remain -echo  as above -guidelines do not recommend isolated right sided valvular lesions for surgical management, especially in high risk patients. This would also not fix the underlying severe pulmonary hypertension. However, right sided failure does not typically result in pleural effusions, so there is also likely a component of LV diastolic failure vs. Other etiology for her effusion -main strategy is volume management, will always have prominent TR pulsation on exam -BNP 632 on admission -Cr, K stable today  Paroxysmal atrial fibrillation -CHA2DS2/VAS Stroke Risk Points= 7  -on reduced dose apixaban given age, weight -on metoprolol succinate -AV paced  Prior bioprosthetic MVR: functioning well on echo  Overall she has upper airway crackles but appears similar to yesterday. There have been discussions re: palliative care on return to her nursing facility. Agree with this recommendation.  [facility] will sign off in anticipation of discharge in next 48 hours.   Medication Recommendations:  Consider increasing lasix home dose to 40 mg daily. Would trial oral dose tomorrow to make sure she has good urine output. Other recommendations (labs, testing, etc):  none Follow up as an outpatient:  Has follow up scheduled with both EP and Dr. Percival Spanish upcoming. Given plans for possible palliative care on discharge, will keep these appointments as scheduled.  For questions or updates, please contact [facility] Please consult [email or web address] for contact info under        Signed, Buford Dresser, MD  10/28/2019, 1:53 PM

## 2019-10-28 NOTE — Consult Note (Signed)
Palliative Care Consult  Reason for consult: Goals of care in light of severe mitral valve regurgitation status post mitral valve replacement, severe tricuspid regurg and pulmonary hypertension  Palliative care consult received.  Chart reviewed including personal review of pertinent labs and imaging.  Briefly, Nancy Bush is a 86 year old female with past medical history of paroxysmal A. fib status post maze operation, hypertension, severe mitral regurg status post mitral valve replacement, severe tricuspid regurg being managed conservatively, chronic diastolic heart failure, symptomatic bradycardia status post pacemaker who presented from assisted living with worsening shortness of breath.  She was found to be hypoxic with minimal ambulation and chest x-ray revealed moderate left pleural effusion but no evidence of pneumonia.  Currently being treated for CHF exacerbation with IV Lasix.  Palliative consulted for goals of care in light of her pulmonary hypertension and valvular disease.  I met today with Nancy Bush.  She is awake and alert and very pleasant in conversation.  She reports the most important things to her are her family and her faith.  She is baptist by faith and has attended [employer] in [place] for most of her life.  It has been diffiuclt for her since they have stopped meeting due to [place].  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  She tells me that she recently transition from her home to assisted living at [facility] and this has been going well for her.  States that she enjoys working crossword puzzles as she believes this helps keep her mind sharp.  She tells me that they have had some social activities but she has not attended much since she has been  there.  We discussed clinical course as well as wishes moving forward in regard to her care plan.  Concepts specific to code status and rehospitalization discussed.  Values and goals of care important to patient and family were attempted to be elicited.  At this point, she feels as though coming to the hospital has served her well and helping her have quality time outside of the hospital.  She feels that continuation with this care plan is the best option at this point in time.  She relies on her niece, Nancy Bush, to help her make medical decisions.  She was in agreement with me calling her today.  I called and spoke with patient's niece, Nancy Bush.  I introduced palliative care as specialized medical care for people living with serious illness. It focuses on providing relief from the symptoms and stress of a serious illness. The goal is to improve quality of life for both the patient and the family.  Nancy Bush reports that Nancy Bush was never married and did not have children of her own.  She has several nieces and nephews who care for her Nancy Bush has been primary contact and serves as her healthcare power of attorney.  She reports she has paperwork with this and she gave me a log and information for [facility] advanced directive website.  I was able to access a copy of her healthcare power of attorney which I printed off and placed on her hard chart.  Nancy Bush and I discussed that the hospital can be useful as long as Nancy Bush is getting well enough  from care she receives at the hospital to enjoy outside the hospital, but there is going to come a time where she may be better served to plan on being at her home (ALF) and bringing care to her rather repeated trips to the hospital. We discussed hospice as a tool that may be beneficial in this goal when he reaches a point where we are trying to fix problems that are not fixable.  She is in agreement that a good plan is to transition back to  [facility] as they have previously been arranging. If she does well at home and continues to thrive, I encouraged they continue with this plan. If, however she is unable to regain function and continues to decline, I recommended that she speak with PCP to determine if she may be better served by focusing care on more of a comfort based approach.  I also introduced the idea of a MOST form with recommendation they consider completing this in order to outline her goals, particularly for when she is sent to the emergency room from ALF.  Nancy Bush is not familiar with this form, and we briefly reviewed how it can be another tool to make Nancy Bush's wishes known.  I am going to mail her a copy of a MOST form to look at and discuss further with patient's PCP during follow-up.  Questions and concerns addressed.   PMT will continue to support holistically.  - DNR - Plan for transition back to ALF on discharge.  Initial discussions regarding advanced care planning with patient and her niece/HC POA today.  At this point, patient feels that she benefits from coming to the hospital and wants to continue to do so at this point in time.  I recommended they continue this conversation with PCP when she is discharged it is determined if she is able to improve in regard to her nutrition, cognition, and functional status.  I also recommended consideration for completion of a MOST form.  We will mail a copy of an blank MOST to patient's [facility] POA to review.  Time in: 0910  Time out: 1020 Total time: 70 minutes  Greater than 50%  of this time was spent counseling and coordinating care related to the above assessment and plan.  Micheline Rough, MD [facility] [phone]

## 2019-10-29 LAB — BASIC METABOLIC PANEL
Anion gap: 9 (ref 5–15)
BUN: 13 mg/dL (ref 8–23)
CO2: 26 mmol/L (ref 22–32)
Calcium: 8.3 mg/dL — ABNORMAL LOW (ref 8.9–10.3)
Chloride: 100 mmol/L (ref 98–111)
Creatinine, Ser: 0.92 mg/dL (ref 0.44–1.00)
GFR calc Af Amer: >60 mL/min (ref >60)
GFR calc non Af Amer: 54 mL/min — ABNORMAL LOW (ref >60)
Glucose, Bld: 106 mg/dL — ABNORMAL HIGH (ref 70–99)
Potassium: 3.6 mmol/L (ref 3.5–5.1)
Sodium: 135 mmol/L (ref 135–145)

## 2019-10-29 LAB — MAGNESIUM: Magnesium: 2 mg/dL (ref 1.7–2.4)

## 2019-10-29 MED ORDER — FUROSEMIDE 20 MG PO TABS: 40.0000 mg | ORAL_TABLET | Freq: Every day | ORAL | 0 refills | Status: DC

## 2019-10-29 NOTE — Plan of Care (Signed)

## 2019-10-29 NOTE — Progress Notes (Signed)
CSW spoke to [facility] from [facility] at [phone] who stated the patient has been offered a bed and has been accepted for return and that the pt can arrive on 3/27.  The pt's accepting doctor is ALF MD.  The room number will be 102.  The number for report is [phone] (ask for Lelan Pons).  CSW will update RN and EDP.  Alphonse Guild. Prabhjot Piscitello, Latanya Presser, LCAS Clinical Social Worker Ph: [phone]

## 2019-10-29 NOTE — Progress Notes (Signed)
[facility] called for 2pm.  CSW will continue to follow for D/C needs.  Alphonse Guild. Arlie Posch  MSW, LCSW, LCAS, CSI Transitions of Care Clinical Social Worker Care Coordination Department Ph: [phone]

## 2019-10-29 NOTE — Progress Notes (Signed)
CSW received a call from pt's RN stating pt is ready for D/C back to [facility], per the provider.  RN agrees to requested signed DNR and/or blue hard copies of scripts, if needed from the provider.  CSW will contact [facility] and provide D/C packet to pt's RN.  CSW called and spoke to the [facility] who stated to call [phone] for report and ask for Lelan Pons and that pt will be going to room 102.  CSW will continue to follow for D/C needs.  Nancy Bush. Elfrieda Espino  MSW, LCSW, LCAS, CSI Transitions of Care Clinical Social Worker Care Coordination Department Ph: [phone]

## 2019-10-29 NOTE — Discharge Summary (Signed)
Physician Discharge Summary  Nancy Bush [id number] DOB: 07-16-1926 DOA: 10/26/2019  PCP: Lawerance Cruel, MD  Admit date: 10/26/2019 Discharge date: 10/29/2019  Admitted From: ALF Disposition: ALF  Recommendations for Outpatient Follow-up:  1. Follow up with PCP in 1 week with repeat CBC/BMP 2. Outpatient follow-up with cardiology 3. Please arrange for outpatient evaluation and follow-up by palliative care team 4. Follow up in ED if symptoms worsen or new appear   Home Health: PT/OT Equipment/Devices: None  Discharge Condition: Guarded to poor CODE STATUS: DNR Diet recommendation: Heart healthy/fluid restriction of up to 1500 cc a day  Brief/Interim Summary: 86 y.o.femalewith medical history significant ofparoxysmal A. fib status post maze operation on Eliquis, hypertension, severe mitral valve regurgitation status post mitral valve replacement, severe TR being managed conservatively, chronic diastolic heart failure, symptomatic bradycardia status post pacemaker placement presented from assisted living facility today for worsening shortness of breath. She was found to be hypoxic in the 80s even with minimal ambulation. Chest x-ray showed moderate left pleural effusion but no evidence of pneumonia.  She was admitted for CHF exacerbation and started on IV Lasix.  During the hospitalization, her pedal edema is improving and she is feeling much better.  Cardiology was also consulted.  Cardiology has signed off with outpatient follow-up recommended.  She will be discharged back to ALF on 40 mg Lasix daily.   Discharge Diagnoses:   Acute on chronic diastolic heart failure Moderate left-sided pleural effusion -Presented with worsening shortness of breath and bilateral leg swelling. -Takes Lasix 20 mg at home -Currently on Lasix 40 mg IV every 12 hours.  2D echo showed EF of 60 to 65% with severe TR, severe pulmonary hypertension and large pleural effusion in the left  lateral region -Cardiology evaluated the patient and signed off recommending Lasix 40 mg daily on discharge. -Continue low-dose beta-blocker. -Chest x-ray and lateral decubitus x-ray on 10/28/2019 still showed bilateral pleural effusion, more on the left side. Will try and avoid thoracentesis as much as possible as patient is currently on room air and not tachypneic.  As an outpatient, if patient starts requiring oxygen or becomes tachypneic, patient might need thoracentesis. -Discharge patient to ALF today on Lasix 40 mg daily with outpatient follow-up with cardiology.  History of severe mitral valve regurgitation status post MVR History of severe TR -2D echo as above.Patient follows up with cardiology as an outpatient and is being managed conservatively -Outpatient follow-up with cardiology  Paroxysmal A. Fib -With history of maze procedure. Currently rate controlled. Continue metoprolol. Continue Eliquis  Severe pulmonary hypertension -Being managed medically.     Outpatient follow-up.  Chronic kidney disease stage III -Creatinine stable. Monitor  Hypertension -Blood pressure stable. Continue metoprolol and Lasix.  Pancytopenia Anemia of chronic disease: Likely from chronic any disease -Questionable cause.  No signs of bleeding.  Monitor   Generalized deconditioning -PT recommends home health PT at ALF. -Palliative care evaluation for goals of care appreciated.  Recommend outpatient palliative care follow-up. Discharge Instructions  Discharge Instructions    Ambulatory referral to Cardiology   Complete by: As directed    CHF followup   Diet - low sodium heart healthy   Complete by: As directed    Increase activity slowly   Complete by: As directed      Allergies as of 10/29/2019      Reactions   Promethazine Hcl Other (See Comments)   unknown   Septra [sulfamethoxazole-trimethoprim]    Sulfonamide Derivatives Other (See Comments)  unknown   Tequin  Other (See Comments)   hallucinations      Medication List    TAKE these medications   albuterol 108 (90 Base) MCG/ACT inhaler Commonly known as: VENTOLIN HFA Inhale 2 puffs into the lungs every 8 (eight) hours as needed for wheezing or shortness of breath.   apixaban 2.5 MG Tabs tablet Commonly known as: Eliquis Take 1 tablet (2.5 mg total) by mouth 2 (two) times daily.   FIBERCON PO Take 1 packet by mouth 2 (two) times daily.   FiberCon 625 MG tablet Generic drug: polycarbophil Take 312.5 mg by mouth daily.   furosemide 20 MG tablet Commonly known as: LASIX Take 2 tablets (40 mg total) by mouth daily. 20mg  daily An extra 20mg  daily as needed for swelling What changed:   how much to take  when to take this   metoprolol succinate 25 MG 24 hr tablet Commonly known as: TOPROL-XL Take 12.5 mg by mouth daily. What changed: Another medication with the same name was removed. Continue taking this medication, and follow the directions you see here.   montelukast 10 MG tablet Commonly known as: SINGULAIR Take 10 mg by mouth daily.   nitroGLYCERIN 0.4 MG SL tablet Commonly known as: NITROSTAT Place 0.4 mg under the tongue every 5 (five) minutes as needed for chest pain (x 3 tabs daily).   polyethylene glycol 17 g packet Commonly known as: MIRALAX / GLYCOLAX Take 34 g by mouth daily.   predniSONE 5 MG tablet Commonly known as: DELTASONE Take 2.5 mg by mouth every other day.   PRESERVISION AREDS PO Take 1 tablet by mouth 2 (two) times daily. Preservision Areds      Follow-up Information    Lawerance Cruel, MD. Schedule an appointment as soon as possible for a visit in 1 week(s).   Specialty: Family Medicine Why: with bmp/cbc Contact information: [address] [place] [address] [phone]        Minus Breeding, MD .   Specialty: Cardiology Contact information: [address] [place] [address] [phone]           Allergies  Allergen Reactions  . Promethazine Hcl Other (See Comments)    unknown  . Septra [Sulfamethoxazole-Trimethoprim]   . Sulfonamide Derivatives Other (See Comments)    unknown  . Tequin Other (See Comments)    hallucinations     Consultations:  Cardiology/palliative care   Procedures/Studies: DG Chest 2 View  Result Date: 10/28/2019 CLINICAL DATA:  Atrial fibrillation, mitral regurgitation, worsening shortness of breath, pleural effusions EXAM: CHEST - 2 VIEW; CHEST - BILATERAL DECUBITUS VIEW COMPARISON:  10/26/2019 FINDINGS: Frontal, lateral, and bilateral decubitus views of the chest are obtained. Dual lead pacemaker and mitral valve prosthesis unchanged. Cardiac silhouette is stable. Persistent bilateral pleural effusions with bibasilar consolidation, left greater than right. No pneumothorax. No acute bony abnormalities. Bilateral decubitus radiographs demonstrates small free-flowing bilateral pleural effusions, left greater than right. Volume estimated less than 500 cc bilaterally. Persistent blunting of the costophrenic angles in the non dependent portions of the lungs consistent with small loculated component of effusions versus pleural thickening. IMPRESSION: 1. Free-flowing bilateral pleural effusions, left greater than right, volume estimated less than 500 cc each. Electronically Signed   By: Randa Ngo M.D.   On: 10/28/2019 12:10   DG Chest Bilateral Decubitus  Result Date: 10/28/2019 CLINICAL DATA:  Atrial fibrillation, mitral regurgitation, worsening shortness of breath, pleural effusions EXAM: CHEST - 2 VIEW; CHEST -  BILATERAL DECUBITUS VIEW COMPARISON:  10/26/2019 FINDINGS: Frontal, lateral, and bilateral decubitus views of the chest are obtained. Dual lead pacemaker and mitral valve prosthesis unchanged. Cardiac silhouette is stable. Persistent bilateral pleural effusions with bibasilar consolidation, left greater than right. No pneumothorax. No acute bony  abnormalities. Bilateral decubitus radiographs demonstrates small free-flowing bilateral pleural effusions, left greater than right. Volume estimated less than 500 cc bilaterally. Persistent blunting of the costophrenic angles in the non dependent portions of the lungs consistent with small loculated component of effusions versus pleural thickening. IMPRESSION: 1. Free-flowing bilateral pleural effusions, left greater than right, volume estimated less than 500 cc each. Electronically Signed   By: Randa Ngo M.D.   On: 10/28/2019 12:10   DG Chest Portable 1 View  Result Date: 10/26/2019 CLINICAL DATA:  Leg swelling increasing for a few days. Also with leg pain. EXAM: PORTABLE CHEST 1 VIEW COMPARISON:  08/26/2019 FINDINGS: Stable changes from prior cardiac surgery and valve replacement cardiac silhouette is mildly enlarged. No mediastinal or hilar masses. Moderate left pleural effusion obscures portions of the left heart border and the left hemidiaphragm. Possible small right pleural effusion. Is mild opacity at the left lung base adjacent to the pleural effusion consistent with atelectasis. Remainder of the lungs is clear. No pneumothorax. Left anterior chest wall dual lead pacemaker is stable. Skeletal structures are grossly intact. IMPRESSION: 1. Moderate left pleural effusion which has increased compared to the prior exam. Mild associated left lung base atelectasis. 2. No evidence of pneumonia and no pulmonary edema. Electronically Signed   By: Lajean Manes M.D.   On: 10/26/2019 14:44   ECHOCARDIOGRAM COMPLETE  Result Date: 10/27/2019    ECHOCARDIOGRAM REPORT   Patient Name:   Nancy Bush Date of Exam: 10/27/2019 Medical Rec #:  [id number]          Height:       56.0 in Accession #:    [id number]         Weight:       104.1 lb Date of Birth:  07/05/1926         BSA:          1.345 m Patient Age:    32 years           BP:           110/50 mmHg Patient Gender: F                  HR:           60  bpm. Exam Location:  Inpatient Procedure: 2D Echo, Cardiac Doppler and Color Doppler Indications:    CHF-Acute Diastolic [id number]  History:        Patient has prior history of Echocardiogram examinations, most                 recent 05/20/2018. CHF, Pacemaker, COPD and Pulmonary HTN,                 Mitral Valve Disease, Arrythmias:Atrial Fibrillation, Atrial                 Flutter and LBBB, Signs/Symptoms:Chest Pain, Shortness of Breath                 and Hypotension; Risk Factors:Non-Smoker. Pleural effusion.                  Mitral Valve: 27 mm bioprosthetic valve valve is present in the  mitral position. Procedure Date: 05/18/2012.  Sonographer:    Vickie Epley RDCS Referring Phys: [id number] [place]  1. Normal LV systolic function; mild LAE; s/p MVR with mean gradient of 6 mmHg and MVA 1.6 cm2; no MR; severe RAE; moderate RVE; severe TR; severe pulmonary hypertension.  2. Left ventricular ejection fraction, by estimation, is 60 to 65%. The left ventricle has normal function. The left ventricle has no regional wall motion abnormalities. Left ventricular diastolic parameters are indeterminate.  3. Right ventricular systolic function is normal. The right ventricular size is moderately enlarged. There is severely elevated pulmonary artery systolic pressure.  4. Left atrial size was mildly dilated.  5. Right atrial size was severely dilated.  6. The mitral valve is normal in structure. No evidence of mitral valve regurgitation. No evidence of mitral stenosis. There is a 27 mm bioprosthetic valve present in the mitral position. Procedure Date: 05/18/2012.  7. Large pleural effusion in the left lateral region.  8. Tricuspid valve regurgitation is severe.  9. The aortic valve is tricuspid. Aortic valve regurgitation is not visualized. Mild aortic valve sclerosis is present, with no evidence of aortic valve stenosis. 10. The inferior vena cava is dilated in size with <50% respiratory  variability, suggesting right atrial pressure of 15 mmHg. FINDINGS  Left Ventricle: Left ventricular ejection fraction, by estimation, is 60 to 65%. The left ventricle has normal function. The left ventricle has no regional wall motion abnormalities. The left ventricular internal cavity size was normal in size. There is  no left ventricular hypertrophy. Left ventricular diastolic parameters are indeterminate. Right Ventricle: The right ventricular size is moderately enlarged.Right ventricular systolic function is normal. There is severely elevated pulmonary artery systolic pressure. The tricuspid regurgitant velocity is 4.21 m/s, and with an assumed right atrial pressure of 15 mmHg, the estimated right ventricular systolic pressure is 0000000 mmHg. Left Atrium: Left atrial size was mildly dilated. Right Atrium: Right atrial size was severely dilated. Pericardium: There is no evidence of pericardial effusion. Mitral Valve: The mitral valve is normal in structure. Normal mobility of the mitral valve leaflets. No evidence of mitral valve regurgitation. There is a 27 mm bioprosthetic valve present in the mitral position. Procedure Date: 05/18/2012. No evidence of mitral valve stenosis. MV peak gradient, 19.0 mmHg. The mean mitral valve gradient is 6.0 mmHg. Tricuspid Valve: The tricuspid valve is normal in structure. Tricuspid valve regurgitation is severe. No evidence of tricuspid stenosis. Aortic Valve: The aortic valve is tricuspid. Aortic valve regurgitation is not visualized. Mild aortic valve sclerosis is present, with no evidence of aortic valve stenosis. Pulmonic Valve: The pulmonic valve was normal in structure. Pulmonic valve regurgitation is trivial. No evidence of pulmonic stenosis. Aorta: The aortic root is normal in size and structure. Venous: The inferior vena cava is dilated in size with less than 50% respiratory variability, suggesting right atrial pressure of 15 mmHg. IAS/Shunts: No atrial level shunt  detected by color flow Doppler. Additional Comments: Normal LV systolic function; mild LAE; s/p MVR with mean gradient of 6 mmHg and MVA 1.6 cm2; no MR; severe RAE; moderate RVE; severe TR; severe pulmonary hypertension. A pacer wire is visualized. There is a large pleural effusion in the left lateral region.  LEFT VENTRICLE PLAX 2D LVIDd:         3.30 cm LVIDs:         2.30 cm LV PW:         1.00 cm LV IVS:  0.80 cm LVOT diam:     1.70 cm LV SV:         40 LV SV Index:   30 LVOT Area:     2.27 cm  RIGHT VENTRICLE RV S prime:     5.22 cm/s TAPSE (M-mode): 1.0 cm LEFT ATRIUM           Index       RIGHT ATRIUM           Index LA diam:      3.90 cm 2.90 cm/m  RA Area:     34.70 cm LA Vol (A2C): 37.8 ml 28.11 ml/m RA Volume:   149.00 ml 110.80 ml/m LA Vol (A4C): 49.5 ml 36.81 ml/m  AORTIC VALVE LVOT Vmax:   82.20 cm/s LVOT Vmean:  51.800 cm/s LVOT VTI:    0.176 m  AORTA Ao Root diam: 2.40 cm MITRAL VALVE              TRICUSPID VALVE MV Area (PHT): 1.59 cm   TR Peak grad:   70.9 mmHg MV Peak grad:  19.0 mmHg  TR Vmax:        421.00 cm/s MV Mean grad:  6.0 mmHg MV Vmax:       2.18 m/s   SHUNTS MV Vmean:      104.0 cm/s Systemic VTI:  0.18 m                           Systemic Diam: 1.70 cm Kirk Ruths MD Electronically signed by Kirk Ruths MD Signature Date/Time: 10/27/2019/11:55:46 AM    Final    CUP PACEART REMOTE DEVICE CHECK  Result Date: 10/18/2019 Scheduled remote reviewed.  Normal device function (DDIR). 4 VHR episodes, 2 EGM's for review show short runs 4-5 beats. Irwin- Eliquis Next remote 91 days- JBox, RN/CVRS      Subjective: Patient seen and examined at bedside.  She is a poor historian, hard of hearing.  Feels better and is okay going home to ALF.  No worsening shortness of breath, fever, nausea or vomiting.  Discharge Exam: Vitals:   10/28/19 2026 10/29/19 0429  BP: (!) 133/49 101/61  Pulse: 64 62  Resp:  18  Temp: 97.8 F (36.6 C) 98.1 F (36.7 C)  SpO2: 100% 96%     General: Pt is alert, awake, not in acute distress.  Elderly female, very labile.  Poor historian.  Hard of hearing. Cardiovascular: rate controlled, S1/S2 + Respiratory: bilateral decreased breath sounds at bases with basilar crackles Abdominal: Soft, NT, ND, bowel sounds + Extremities: Trace bilateral lower extremity edema, no cyanosis    The results of significant diagnostics from this hospitalization (including imaging, microbiology, ancillary and laboratory) are listed below for reference.     Microbiology: Recent Results (from the past 240 hour(s))  SARS CORONAVIRUS 2 (TAT 6-24 HRS) Nasopharyngeal Nasopharyngeal Swab     Status: None   Collection Time: 10/26/19  6:01 PM   Specimen: Nasopharyngeal Swab  Result Value Ref Range Status   SARS Coronavirus 2 NEGATIVE NEGATIVE Final    Comment: (NOTE) SARS-CoV-2 target nucleic acids are NOT DETECTED. The SARS-CoV-2 RNA is generally detectable in upper and lower respiratory specimens during the acute phase of infection. Negative results do not preclude SARS-CoV-2 infection, do not rule out co-infections with other pathogens, and should not be used as the sole basis for treatment or other patient management decisions. Negative results must be combined with clinical  observations, patient history, and epidemiological information. The expected result is Negative. Fact Sheet for Patients: SugarRoll.be Fact Sheet for Healthcare Providers: https://www.woods-mathews.com/ This test is not yet approved or cleared by the [place] FDA and  has been authorized for detection and/or diagnosis of SARS-CoV-2 by FDA under an Emergency Use Authorization (EUA). This EUA will remain  in effect (meaning this test can be used) for the duration of the COVID-19 declaration under Section 56 4(b)(1) of the Act, 21 U.S.C. section 360bbb-3(b)(1), unless the authorization is terminated or revoked  sooner. Performed at [facility], [place] [address] [place], [place] [address]      Labs: BNP (last 3 results) Recent Labs    08/26/19 1801 10/26/19 1518  BNP 571.3* 0000000*   Basic Metabolic Panel: Recent Labs  Lab 10/26/19 1518 10/27/19 0424 10/28/19 0412 10/29/19 0414  NA 135 136 135 135  K 4.2 3.5 3.6 3.6  CL 97* 100 99 100  CO2 26 28 26 26   GLUCOSE 123* 109* 97 106*  BUN 15 15 13 13   CREATININE 0.88 0.92 0.88 0.92  CALCIUM 8.6* 8.5* 8.5* 8.3*  MG  --  2.0 2.0 2.0   Liver Function Tests: Recent Labs  Lab 10/27/19 0424  AST 19  ALT 15  ALKPHOS 58  BILITOT 0.9  PROT 6.3*  ALBUMIN 3.6   No results for input(s): LIPASE, AMYLASE in the last 168 hours. No results for input(s): AMMONIA in the last 168 hours. CBC: Recent Labs  Lab 10/26/19 1518 10/27/19 0424  WBC 3.0* 2.8*  HGB 9.1* 9.3*  HCT 30.6* 29.8*  MCV 94.4 92.5  PLT 97* 91*   Cardiac Enzymes: No results for input(s): CKTOTAL, CKMB, CKMBINDEX, TROPONINI in the last 168 hours. BNP: Invalid input(s): POCBNP CBG: No results for input(s): GLUCAP in the last 168 hours. D-Dimer No results for input(s): DDIMER in the last 72 hours. Hgb A1c No results for input(s): HGBA1C in the last 72 hours. Lipid Profile No results for input(s): CHOL, HDL, LDLCALC, TRIG, CHOLHDL, LDLDIRECT in the last 72 hours. Thyroid function studies No results for input(s): TSH, T4TOTAL, T3FREE, THYROIDAB in the last 72 hours.  Invalid input(s): FREET3 Anemia work up No results for input(s): VITAMINB12, FOLATE, FERRITIN, TIBC, IRON, RETICCTPCT in the last 72 hours. Urinalysis    Component Value Date/Time   COLORURINE YELLOW 08/26/2019 1754   APPEARANCEUR HAZY (A) 08/26/2019 1754   APPEARANCEUR Clear 10/12/2018 1429   LABSPEC 1.015 08/26/2019 1754   PHURINE 6.0 08/26/2019 1754   GLUCOSEU NEGATIVE 08/26/2019 1754   HGBUR NEGATIVE 08/26/2019 1754   BILIRUBINUR NEGATIVE 08/26/2019 1754   BILIRUBINUR Negative  10/12/2018 1429   KETONESUR 20 (A) 08/26/2019 1754   PROTEINUR 30 (A) 08/26/2019 1754   UROBILINOGEN 0.2 08/21/2012 1645   NITRITE NEGATIVE 08/26/2019 1754   LEUKOCYTESUR NEGATIVE 08/26/2019 1754   Sepsis Labs Invalid input(s): PROCALCITONIN,  WBC,  LACTICIDVEN Microbiology Recent Results (from the past 240 hour(s))  SARS CORONAVIRUS 2 (TAT 6-24 HRS) Nasopharyngeal Nasopharyngeal Swab     Status: None   Collection Time: 10/26/19  6:01 PM   Specimen: Nasopharyngeal Swab  Result Value Ref Range Status   SARS Coronavirus 2 NEGATIVE NEGATIVE Final    Comment: (NOTE) SARS-CoV-2 target nucleic acids are NOT DETECTED. The SARS-CoV-2 RNA is generally detectable in upper and lower respiratory specimens during the acute phase of infection. Negative results do not preclude SARS-CoV-2 infection, do not rule out co-infections with other pathogens, and should not be  used as the sole basis for treatment or other patient management decisions. Negative results must be combined with clinical observations, patient history, and epidemiological information. The expected result is Negative. Fact Sheet for Patients: SugarRoll.be Fact Sheet for Healthcare Providers: https://www.woods-mathews.com/ This test is not yet approved or cleared by the [place] FDA and  has been authorized for detection and/or diagnosis of SARS-CoV-2 by FDA under an Emergency Use Authorization (EUA). This EUA will remain  in effect (meaning this test can be used) for the duration of the COVID-19 declaration under Section 56 4(b)(1) of the Act, 21 U.S.C. section 360bbb-3(b)(1), unless the authorization is terminated or revoked sooner. Performed at [facility], [place] [address] [place], [place] [address]      Time coordinating discharge: 35 minutes  SIGNED:   Aline August, MD  [facility] 10/29/2019, 8:38 AM

## 2019-10-29 NOTE — Progress Notes (Signed)
Report call to Dianna, [place].at [facility], Rm 102 [phone] EMS dispatched, pt without acute distress. SRP, RN

## 2019-10-29 NOTE — Progress Notes (Signed)
Pt discharged to [facility]. Pt in stable condition SRP, RN

## 2019-10-31 ENCOUNTER — Encounter (HOSPITAL_COMMUNITY): Payer: Self-pay | Admitting: Emergency Medicine

## 2019-10-31 ENCOUNTER — Other Ambulatory Visit: Payer: Self-pay

## 2019-10-31 ENCOUNTER — Emergency Department (HOSPITAL_COMMUNITY)
Admission: EM | Admit: 2019-10-31 | Discharge: 2019-10-31 | Disposition: A | Discharge status: Home / Self Care | Payer: Medicare Other | Attending: Emergency Medicine | Admitting: Emergency Medicine

## 2019-10-31 ENCOUNTER — Emergency Department (HOSPITAL_COMMUNITY): Payer: Medicare Other

## 2019-10-31 DIAGNOSIS — I5030 Unspecified diastolic (congestive) heart failure: Secondary | ICD-10-CM | POA: Insufficient documentation

## 2019-10-31 DIAGNOSIS — R140 Abdominal distension (gaseous): Secondary | ICD-10-CM | POA: Insufficient documentation

## 2019-10-31 DIAGNOSIS — Z7901 Long term (current) use of anticoagulants: Secondary | ICD-10-CM | POA: Insufficient documentation

## 2019-10-31 DIAGNOSIS — J90 Pleural effusion, not elsewhere classified: Secondary | ICD-10-CM | POA: Diagnosis not present

## 2019-10-31 DIAGNOSIS — I110 Hypertensive heart disease with heart failure: Secondary | ICD-10-CM | POA: Diagnosis not present

## 2019-10-31 DIAGNOSIS — R279 Unspecified lack of coordination: Secondary | ICD-10-CM | POA: Diagnosis not present

## 2019-10-31 DIAGNOSIS — I509 Heart failure, unspecified: Secondary | ICD-10-CM

## 2019-10-31 DIAGNOSIS — R34 Anuria and oliguria: Secondary | ICD-10-CM

## 2019-10-31 DIAGNOSIS — Z952 Presence of prosthetic heart valve: Secondary | ICD-10-CM | POA: Insufficient documentation

## 2019-10-31 DIAGNOSIS — I130 Hypertensive heart and chronic kidney disease with heart failure and stage 1 through stage 4 chronic kidney disease, or unspecified chronic kidney disease: Secondary | ICD-10-CM | POA: Insufficient documentation

## 2019-10-31 DIAGNOSIS — R4182 Altered mental status, unspecified: Secondary | ICD-10-CM | POA: Diagnosis not present

## 2019-10-31 DIAGNOSIS — Z9049 Acquired absence of other specified parts of digestive tract: Secondary | ICD-10-CM | POA: Diagnosis not present

## 2019-10-31 DIAGNOSIS — R52 Pain, unspecified: Secondary | ICD-10-CM | POA: Diagnosis not present

## 2019-10-31 DIAGNOSIS — Z950 Presence of cardiac pacemaker: Secondary | ICD-10-CM | POA: Insufficient documentation

## 2019-10-31 DIAGNOSIS — N183 Chronic kidney disease, stage 3 unspecified: Secondary | ICD-10-CM | POA: Diagnosis not present

## 2019-10-31 DIAGNOSIS — J449 Chronic obstructive pulmonary disease, unspecified: Secondary | ICD-10-CM | POA: Insufficient documentation

## 2019-10-31 DIAGNOSIS — Z79899 Other long term (current) drug therapy: Secondary | ICD-10-CM | POA: Diagnosis not present

## 2019-10-31 DIAGNOSIS — R609 Edema, unspecified: Secondary | ICD-10-CM | POA: Diagnosis not present

## 2019-10-31 DIAGNOSIS — R339 Retention of urine, unspecified: Secondary | ICD-10-CM | POA: Insufficient documentation

## 2019-10-31 DIAGNOSIS — Z743 Need for continuous supervision: Secondary | ICD-10-CM | POA: Diagnosis not present

## 2019-10-31 DIAGNOSIS — R188 Other ascites: Secondary | ICD-10-CM | POA: Diagnosis not present

## 2019-10-31 DIAGNOSIS — R3912 Poor urinary stream: Secondary | ICD-10-CM | POA: Diagnosis not present

## 2019-10-31 LAB — COMPREHENSIVE METABOLIC PANEL
ALT: 10 U/L (ref 0–44)
AST: 22 U/L (ref 15–41)
Albumin: 3.8 g/dL (ref 3.5–5.0)
Alkaline Phosphatase: 76 U/L (ref 38–126)
Anion gap: 8 (ref 5–15)
BUN: 14 mg/dL (ref 8–23)
CO2: 30 mmol/L (ref 22–32)
Calcium: 8.7 mg/dL — ABNORMAL LOW (ref 8.9–10.3)
Chloride: 99 mmol/L (ref 98–111)
Creatinine, Ser: 1.13 mg/dL — ABNORMAL HIGH (ref 0.44–1.00)
GFR calc Af Amer: 49 mL/min — ABNORMAL LOW (ref >60)
GFR calc non Af Amer: 42 mL/min — ABNORMAL LOW (ref >60)
Glucose, Bld: 114 mg/dL — ABNORMAL HIGH (ref 70–99)
Potassium: 3.7 mmol/L (ref 3.5–5.1)
Sodium: 137 mmol/L (ref 135–145)
Total Bilirubin: 0.6 mg/dL (ref 0.3–1.2)
Total Protein: 6.9 g/dL (ref 6.5–8.1)

## 2019-10-31 LAB — CBC WITH DIFFERENTIAL/PLATELET
Abs Immature Granulocytes: 0.02 10*3/uL (ref 0.00–0.07)
Basophils Absolute: 0 10*3/uL (ref 0.0–0.1)
Basophils Relative: 1 %
Eosinophils Absolute: 0.1 10*3/uL (ref 0.0–0.5)
Eosinophils Relative: 2 %
HCT: 31.2 % — ABNORMAL LOW (ref 36.0–46.0)
Hemoglobin: 9.6 g/dL — ABNORMAL LOW (ref 12.0–15.0)
Immature Granulocytes: 1 %
Lymphocytes Relative: 20 %
Lymphs Abs: 0.6 10*3/uL — ABNORMAL LOW (ref 0.7–4.0)
MCH: 28.5 pg (ref 26.0–34.0)
MCHC: 30.8 g/dL (ref 30.0–36.0)
MCV: 92.6 fL (ref 80.0–100.0)
Monocytes Absolute: 0.8 10*3/uL (ref 0.1–1.0)
Monocytes Relative: 31 %
Neutro Abs: 1.2 10*3/uL — ABNORMAL LOW (ref 1.7–7.7)
Neutrophils Relative %: 45 %
Platelets: 107 10*3/uL — ABNORMAL LOW (ref 150–400)
RBC: 3.37 MIL/uL — ABNORMAL LOW (ref 3.87–5.11)
RDW: 14.2 % (ref 11.5–15.5)
WBC: 2.7 10*3/uL — ABNORMAL LOW (ref 4.0–10.5)
nRBC: 0 % (ref 0.0–0.2)

## 2019-10-31 LAB — URINALYSIS, ROUTINE W REFLEX MICROSCOPIC
Bilirubin Urine: NEGATIVE
Glucose, UA: NEGATIVE mg/dL
Hgb urine dipstick: NEGATIVE
Ketones, ur: NEGATIVE mg/dL
Leukocytes,Ua: NEGATIVE
Nitrite: NEGATIVE
Protein, ur: NEGATIVE mg/dL
Specific Gravity, Urine: 1.005 (ref 1.005–1.030)
pH: 6 (ref 5.0–8.0)

## 2019-10-31 LAB — BRAIN NATRIURETIC PEPTIDE: B Natriuretic Peptide: 656.7 pg/mL — ABNORMAL HIGH (ref 0.0–100.0)

## 2019-10-31 MED ORDER — FUROSEMIDE 10 MG/ML IJ SOLN
40.0000 mg | Freq: Once | INTRAMUSCULAR | Status: AC
  Administered 2019-10-31: 40 mg via INTRAVENOUS
  Filled 2019-10-31: qty 4

## 2019-10-31 MED ORDER — IOHEXOL 300 MG/ML  SOLN
75.0000 mL | Freq: Once | INTRAMUSCULAR | Status: AC | PRN
  Administered 2019-10-31: 20:00:00 75 mL via INTRAVENOUS

## 2019-10-31 MED ORDER — SODIUM CHLORIDE (PF) 0.9 % IJ SOLN
INTRAMUSCULAR | Status: AC
  Filled 2019-10-31: qty 50

## 2019-10-31 NOTE — ED Notes (Signed)
Pt transported to CT ?

## 2019-10-31 NOTE — Discharge Instructions (Signed)
You taking home medications as prescribed, especially the Lasix. Follow up closely with your primary care doctor for reevaluation of your symptoms. Return to the emergency room with any new, worsening, concerning symptoms.

## 2019-10-31 NOTE — ED Notes (Signed)
[facility] called for transport back to [facility]. 

## 2019-10-31 NOTE — ED Triage Notes (Addendum)
BIB [facility] from [facility] - EMS reports pt was recently dx with pleural effusion and was rx'd lasix for treatment but now cannot urinate. Pt states when urinating it "dribbles out". Pt endorses discomfort but denies pain, N/V/D. Abdominal distention and tightness noted upon assessment. A&O x 4 and ambulatory.

## 2019-10-31 NOTE — ED Provider Notes (Signed)
[facility] DEPT Provider Note   CSN: [id number] Arrival date & time: 10/31/19  1544     History Chief Complaint  Patient presents with  . Urinary Retention    Nancy Bush is a 86 y.o. female sending for evaluation of leg swelling and decreased urination.  Patient states she has had decreased urine today.  She states that when she tries to urinate, only a little bit dribbles out.  She reports worsening swelling of bilateral lower legs and abdomen.  She was recently in the hospital for CHF exacerbation.  She states she did not notice any symptoms yesterday.  She has been taking her fluid pills as prescribed.  She denies fevers, chills, chest pain, shortness of breath, cough, nausea, vomiting, abnormal bowel movements.  Additional history obtained from chart review.  Patient with a history of mitral regurgitation, A. fib on anticoagulation, COPD, CHF, CKD, pleural effusions.  I reviewed recent admission notes.  HPI     Past Medical History:  Diagnosis Date  . Asthma   . Atrial fibrillation ([place])   . CHF (congestive heart failure) ([place])   . COPD (chronic obstructive pulmonary disease) ([place])   . H/O: hysterectomy   . Hypertension   . LBBB (left bundle branch block)    per discharge note 2003  . Mitral regurgitation    MILD  . MVP (mitral valve prolapse)   . Pacemaker   . Pulmonary hypertension ([place])    MILD TO MODERATE BY ECHO  . PVC (premature ventricular contraction)   . Rheumatoid arthritis(714.0)   . S/P Maze operation for atrial fibrillation 05/18/2012   Complete biatrial lesion set using cryothermy with oversewing of LA appendage  . S/P mitral valve replacement with bioprosthetic valve 05/18/2012   53mm Edwards Magna Mitral pericardial bioprosthesis     Patient Active Problem List   Diagnosis Date Noted  . CHF exacerbation ([place]) 10/26/2019  . Educated about COVID-19 virus infection 03/20/2019  . Hypotension 03/18/2019  . S/P  MVR (mitral valve repair) 03/18/2019  . Pancytopenia ([place]) 05/21/2018  . TGA (transient global amnesia) 05/19/2018  . Essential hypertension 12/11/2017  . Medication management 12/11/2017  . Pulmonary HTN ([place]) 12/30/2016  . Protein calorie malnutrition ([place]) 08/22/2012  . CAP (community acquired pneumonia) 08/21/2012  . Hypokalemia 08/21/2012  . SOB (shortness of breath) 08/20/2012  . FTT (failure to thrive) in adult 08/20/2012  . Leucocytosis 08/20/2012  . Atrial flutter ([place]) 06/24/2012  . CKD (chronic kidney disease), stage III 06/24/2012  . Pleural effusion 06/24/2012  . [employer] 05/25/2012  . Bradycardia 05/21/2012  . S/P mitral valve replacement with bioprosthetic valve 05/18/2012  . S/P Maze operation for atrial fibrillation 05/18/2012  . Severe mitral regurgitation 05/17/2012  . A-fib ([place]) 05/17/2012  . MR (mitral regurgitation) 05/03/2012  . Fatigue 01/29/2012  . Acute on chronic diastolic heart failure ([place]) 06/10/2011  . CKD (chronic kidney disease) 06/03/2011  . Encounter for long-term (current) use of anticoagulants 05/26/2011  . Atrial fibrillation ([place]) 05/19/2011  . CHEST PAIN UNSPECIFIED 08/28/2010  . ABNORMAL ELECTROCARDIOGRAM 11/14/2009  . Mitral Regurgitation 11/12/2009  . PULMONARY HYPERTENSION 11/12/2009  . Mitral valve disorder 11/12/2009  . SINUSITIS, CHRONIC 06/12/2007  . ALLERGIC RHINITIS 06/12/2007  . BRONCHITIS 06/12/2007  . COPD 06/12/2007    Past Surgical History:  Procedure Laterality Date  . ABDOMINAL HYSTERECTOMY    . APPENDECTOMY    . CATARACT EXTRACTION    . CHOLECYSTECTOMY    . hysterectomy    .  MAZE  05/18/2012   Procedure: MAZE;  Surgeon: Rexene Alberts, MD;  Location: [facility];  Service: Open Heart Surgery;  Laterality: Right;  . MITRAL VALVE REPLACEMENT  05/18/2012   Procedure: MINIMALLY INVASIVE MITRAL VALVE (MV) REPLACEMENT;  Surgeon: Rexene Alberts, MD;  Location: [facility];  Service: Open Heart Surgery;   Laterality: N/A;  . OVARIAN CYST REMOVAL    . PACEMAKER INSERTION    . PERMANENT PACEMAKER INSERTION N/A 05/21/2012   Procedure: PERMANENT PACEMAKER INSERTION;  Surgeon: Evans Lance, MD;  Location[facility] LAB;  Service: Cardiovascular;  Laterality: N/A;  . TEE WITHOUT CARDIOVERSION  02/09/2012   Procedure: TRANSESOPHAGEAL ECHOCARDIOGRAM (TEE);  Surgeon: Fay Records, MD;  Location: [facility] ENDOSCOPY;  Service: Cardiovascular;  Laterality: N/A;     OB History   No obstetric history on file.     History reviewed. No pertinent family history.  Social History   Tobacco Use  . Smoking status: Never Smoker  . Smokeless tobacco: Never Used  Substance Use Topics  . Alcohol use: No  . Drug use: No    Home Medications Prior to Admission medications   Medication Sig Start Date End Date Taking? Authorizing Provider  albuterol (VENTOLIN HFA) 108 (90 Base) MCG/ACT inhaler Inhale 2 puffs into the lungs every 6 (six) hours as needed for wheezing or shortness of breath.  10/11/19  Yes [provider]  apixaban (ELIQUIS) 2.5 MG TABS tablet Take 1 tablet (2.5 mg total) by mouth 2 (two) times daily. 08/30/19  Yes Minus Breeding, MD  furosemide (LASIX) 20 MG tablet Take 2 tablets (40 mg total) by mouth daily. 20mg  daily An extra 20mg  daily as needed for swelling Patient taking differently: Take 40 mg by mouth daily.  10/29/19  Yes Aline August, MD  metoprolol succinate (TOPROL-XL) 25 MG 24 hr tablet Take 12.5 mg by mouth daily.   Yes [provider]  montelukast (SINGULAIR) 10 MG tablet Take 10 mg by mouth daily.  12/31/12  Yes [provider]  Multiple Vitamins-Minerals (PRESERVISION AREDS PO) Take 1 tablet by mouth 2 (two) times daily. Preservision Areds    Yes [provider]  nitroGLYCERIN (NITROSTAT) 0.4 MG SL tablet Place 0.4 mg under the tongue every 5 (five) minutes as needed for chest pain.    Yes [provider]  polycarbophil (FIBERCON) 625 MG tablet  Take 312.5 mg by mouth daily. With 8 oz of water   Yes [provider]  polyethylene glycol (MIRALAX / GLYCOLAX) 17 g packet Take 34 g by mouth daily. With 8 oz of liquid   Yes [provider]  predniSONE (DELTASONE) 5 MG tablet Take 2.5 mg by mouth every other day.   Yes [provider]    Allergies    Promethazine hcl, Septra [sulfamethoxazole-trimethoprim], Sulfonamide derivatives, and Tequin  Review of Systems   Review of Systems  Cardiovascular: Positive for leg swelling.  Gastrointestinal: Positive for abdominal distention.  Genitourinary: Positive for decreased urine volume.  All other systems reviewed and are negative.   Physical Exam Updated Vital Signs BP 136/64   Pulse 60   Temp (!) 97.5 F (36.4 C) (Oral)   Resp 17   SpO2 99%   Physical Exam Vitals and nursing note reviewed.  Constitutional:      General: She is not in acute distress.    Appearance: She is well-developed.     Comments: Elderly female resting comfortably in the bed in no acute distress  HENT:  Head: Normocephalic and atraumatic.  Eyes:     Conjunctiva/sclera: Conjunctivae normal.     Pupils: Pupils are equal, round, and reactive to light.  Cardiovascular:     Rate and Rhythm: Normal rate and regular rhythm.     Pulses: Normal pulses.  Pulmonary:     Effort: Pulmonary effort is normal. No respiratory distress.     Breath sounds: No wheezing.     Comments: Diminished lung sounds in bilateral bases.  Speaking in full sentences.  Sats stable on room air. Abdominal:     General: There is distension.     Palpations: Abdomen is soft. There is no mass.     Tenderness: There is no abdominal tenderness. There is no guarding or rebound.     Comments: Lower abdomen firm and mildly distended.  No tenderness.  No rigidity or guarding.  Negative rebound.  Musculoskeletal:        General: Normal range of motion.     Cervical back: Normal range of motion and neck supple.      Right lower leg: Edema present.     Left lower leg: Edema present.     Comments: 3+ pitting edema bilaterally.  Pedal pulses intact bilaterally.  Skin:    General: Skin is warm and dry.     Capillary Refill: Capillary refill takes less than 2 seconds.  Neurological:     Mental Status: She is alert and oriented to person, place, and time.     ED Results / Procedures / Treatments   Labs (all labs ordered are listed, but only abnormal results are displayed) Labs Reviewed  CBC WITH DIFFERENTIAL/PLATELET - Abnormal; Notable for the following components:      Result Value   WBC 2.7 (*)    RBC 3.37 (*)    Hemoglobin 9.6 (*)    HCT 31.2 (*)    Platelets 107 (*)    Neutro Abs 1.2 (*)    Lymphs Abs 0.6 (*)    All other components within normal limits  COMPREHENSIVE METABOLIC PANEL - Abnormal; Notable for the following components:   Glucose, Bld 114 (*)    Creatinine, Ser 1.13 (*)    Calcium 8.7 (*)    GFR calc non Af Amer 42 (*)    GFR calc Af Amer 49 (*)    All other components within normal limits  BRAIN NATRIURETIC PEPTIDE - Abnormal; Notable for the following components:   B Natriuretic Peptide 656.7 (*)    All other components within normal limits  URINALYSIS, ROUTINE W REFLEX MICROSCOPIC - Abnormal; Notable for the following components:   Color, Urine STRAW (*)    All other components within normal limits  URINE CULTURE    EKG None  Radiology CT ABDOMEN PELVIS W CONTRAST  Result Date: 10/31/2019 CLINICAL DATA:  Unable to urinate EXAM: CT ABDOMEN AND PELVIS WITH CONTRAST TECHNIQUE: Multidetector CT imaging of the abdomen and pelvis was performed using the standard protocol following bolus administration of intravenous contrast. CONTRAST:  85mL OMNIPAQUE IOHEXOL 300 MG/ML  SOLN COMPARISON:  CT 04/08/2018 FINDINGS: Lower chest: Small right-sided pleural effusion. Small moderate left-sided pleural effusion. Consolidation in the left lung base likely atelectasis. Cardiomegaly  with incompletely visualized cardiac pacing leads. Mitral valve prosthesis. Dilated right atrium. No pericardial effusion. Hepatobiliary: Reflux of contrast into the hepatic veins consistent with elevated right heart pressure. Status post cholecystectomy. No focal hepatic abnormality. Negative for biliary dilatation. Pancreas: Unremarkable. No pancreatic ductal dilatation or surrounding inflammatory changes. Spleen:  Normal in size without focal abnormality. Adrenals/Urinary Tract: Adrenal glands are within normal limits. Subcentimeter hypodensities in the kidneys too small to further characterize. No hydronephrosis. The urinary bladder is unremarkable. Stomach/Bowel: Stomach nonenlarged. No dilated small bowel. No bowel wall thickening. Appendix not well seen but no right lower quadrant inflammatory process. Vascular/Lymphatic: Nonaneurysmal aorta. Moderate aortic atherosclerosis. No significant adenopathy. Reproductive: Status post hysterectomy. No adnexal masses. Other: No free air. Small abdominopelvic ascites. Generalized subcutaneous edema consistent with anasarca. Musculoskeletal: No acute or suspicious osseous abnormality. Grade 1 anterolisthesis L4 on L5 with degenerative change IMPRESSION: 1. Bilateral pleural effusions, small in the right and small moderate on the left with probable passive atelectasis at the left base. Cardiomegaly with findings suggestive of elevated right heart pressure. 2. Small amount of ascites. Generalized body wall edema consistent with anasarca 3. Otherwise no CT evidence for acute intra-abdominal or pelvic abnormality. Negative for hydronephrosis. Electronically Signed   By: Donavan Foil M.D.   On: 10/31/2019 19:57   DG Chest Portable 1 View  Result Date: 10/31/2019 CLINICAL DATA:  Pleural effusion, inability to urinate EXAM: PORTABLE CHEST 1 VIEW COMPARISON:  10/28/2019 FINDINGS: Single frontal view of the chest demonstrates persistent enlargement the cardiac silhouette.  Mitral valve prosthesis unchanged. Dual lead pacemaker is stable. Bilateral pleural effusions, left greater than right. Likely atelectasis left base. No pneumothorax. IMPRESSION: 1. Bilateral pleural effusions, left greater than right. Electronically Signed   By: Randa Ngo M.D.   On: 10/31/2019 17:38    Procedures Procedures (including critical care time)  Medications Ordered in ED Medications  sodium chloride (PF) 0.9 % injection (has no administration in time range)  iohexol (OMNIPAQUE) 300 MG/ML solution 75 mL (75 mLs Intravenous Contrast Given 10/31/19 1933)  furosemide (LASIX) injection 40 mg (40 mg Intravenous Given 10/31/19 2032)    ED Course  I have reviewed the triage vital signs and the nursing notes.  Pertinent labs & imaging results that were available during my care of the patient were reviewed by me and considered in my medical decision making (see chart for details).    MDM Rules/Calculators/A&P                      Patient presenting for evaluation of decreased urination and increased swelling.  On exam, patient has bilateral equal pitting edema.  She has firmness and mild distention of the lower abdomen, but no tenderness.  No signs of respiratory distress or compromise.  In the setting of decreased urination, concern about worsening CHF.  Bladder scan shows only 26, as such doubt obstruction.  Will obtain labs to assess BMP and kidney function.  X-ray to assess for fluid status.  Labs interpreted by me, overall reassuring.  Creatinine at baseline.  BNP similar to previous.  Chest x-ray viewed interpreted by me, shows mild bilateral pleural effusions, which was present during her last admission.  Hemoglobin and white count similar to previous, both mildly low.  Case discussed with attending, Dr. Vallery Ridge evaluated the patient.  Patient has had increased urine output while in the ED.  As she has moderate respiratory complications, will give another dose of Lasix in the ED,  and have patient continue taking her home meds.  I do not wish needs to be admitted at this time, as she now has good urine output.  Discussed continued use of home medications and close follow-up with primary care.  At this time, patient appears safe for discharge.  Return precautions given.  Patient states she understands and agrees to plan.  Final Clinical Impression(s) / ED Diagnoses Final diagnoses:  Decreased urine output  Congestive heart failure, unspecified HF chronicity, unspecified heart failure type [facility])    Rx / DC Orders ED Discharge Orders    None       Franchot Heidelberg, PA-C 10/31/19 2312    Charlesetta Shanks, MD 11/09/19 2344

## 2019-11-01 DIAGNOSIS — Z20828 Contact with and (suspected) exposure to other viral communicable diseases: Secondary | ICD-10-CM | POA: Diagnosis not present

## 2019-11-01 LAB — URINE CULTURE

## 2019-11-04 ENCOUNTER — Emergency Department (HOSPITAL_COMMUNITY)
Admission: EM | Admit: 2019-11-04 | Discharge: 2019-11-05 | Disposition: A | Discharge status: Skilled Nursing Facility | Payer: Medicare Other | Attending: Emergency Medicine | Admitting: Emergency Medicine

## 2019-11-04 ENCOUNTER — Emergency Department (HOSPITAL_COMMUNITY): Payer: Medicare Other

## 2019-11-04 ENCOUNTER — Other Ambulatory Visit: Payer: Self-pay

## 2019-11-04 DIAGNOSIS — I509 Heart failure, unspecified: Secondary | ICD-10-CM | POA: Diagnosis not present

## 2019-11-04 DIAGNOSIS — N183 Chronic kidney disease, stage 3 unspecified: Secondary | ICD-10-CM | POA: Diagnosis not present

## 2019-11-04 DIAGNOSIS — Z7901 Long term (current) use of anticoagulants: Secondary | ICD-10-CM | POA: Insufficient documentation

## 2019-11-04 DIAGNOSIS — R609 Edema, unspecified: Secondary | ICD-10-CM | POA: Diagnosis not present

## 2019-11-04 DIAGNOSIS — R350 Frequency of micturition: Secondary | ICD-10-CM | POA: Insufficient documentation

## 2019-11-04 DIAGNOSIS — R0902 Hypoxemia: Secondary | ICD-10-CM | POA: Diagnosis not present

## 2019-11-04 DIAGNOSIS — R600 Localized edema: Secondary | ICD-10-CM | POA: Diagnosis present

## 2019-11-04 DIAGNOSIS — Z79899 Other long term (current) drug therapy: Secondary | ICD-10-CM | POA: Insufficient documentation

## 2019-11-04 DIAGNOSIS — R34 Anuria and oliguria: Secondary | ICD-10-CM | POA: Diagnosis not present

## 2019-11-04 DIAGNOSIS — I130 Hypertensive heart and chronic kidney disease with heart failure and stage 1 through stage 4 chronic kidney disease, or unspecified chronic kidney disease: Secondary | ICD-10-CM | POA: Insufficient documentation

## 2019-11-04 DIAGNOSIS — R39198 Other difficulties with micturition: Secondary | ICD-10-CM | POA: Insufficient documentation

## 2019-11-04 DIAGNOSIS — I10 Essential (primary) hypertension: Secondary | ICD-10-CM | POA: Diagnosis not present

## 2019-11-04 DIAGNOSIS — I959 Hypotension, unspecified: Secondary | ICD-10-CM | POA: Diagnosis not present

## 2019-11-04 DIAGNOSIS — J90 Pleural effusion, not elsewhere classified: Secondary | ICD-10-CM | POA: Diagnosis not present

## 2019-11-04 LAB — URINALYSIS, ROUTINE W REFLEX MICROSCOPIC
Bacteria, UA: NONE SEEN
Bilirubin Urine: NEGATIVE
Glucose, UA: NEGATIVE mg/dL
Hgb urine dipstick: NEGATIVE
Ketones, ur: NEGATIVE mg/dL
Leukocytes,Ua: NEGATIVE
Nitrite: NEGATIVE
Protein, ur: 30 mg/dL — AB
Specific Gravity, Urine: 1.016 (ref 1.005–1.030)
pH: 6 (ref 5.0–8.0)

## 2019-11-04 LAB — CBC WITH DIFFERENTIAL/PLATELET
Abs Immature Granulocytes: 0 10*3/uL (ref 0.00–0.07)
Basophils Absolute: 0 10*3/uL (ref 0.0–0.1)
Basophils Relative: 1 %
Eosinophils Absolute: 0.1 10*3/uL (ref 0.0–0.5)
Eosinophils Relative: 3 %
HCT: 31.3 % — ABNORMAL LOW (ref 36.0–46.0)
Hemoglobin: 9.6 g/dL — ABNORMAL LOW (ref 12.0–15.0)
Immature Granulocytes: 0 %
Lymphocytes Relative: 24 %
Lymphs Abs: 0.7 10*3/uL (ref 0.7–4.0)
MCH: 28.7 pg (ref 26.0–34.0)
MCHC: 30.7 g/dL (ref 30.0–36.0)
MCV: 93.7 fL (ref 80.0–100.0)
Monocytes Absolute: 0.9 10*3/uL (ref 0.1–1.0)
Monocytes Relative: 31 %
Neutro Abs: 1.2 10*3/uL — ABNORMAL LOW (ref 1.7–7.7)
Neutrophils Relative %: 41 %
Platelets: 98 10*3/uL — ABNORMAL LOW (ref 150–400)
RBC: 3.34 MIL/uL — ABNORMAL LOW (ref 3.87–5.11)
RDW: 14.2 % (ref 11.5–15.5)
WBC: 2.8 10*3/uL — ABNORMAL LOW (ref 4.0–10.5)
nRBC: 0 % (ref 0.0–0.2)

## 2019-11-04 LAB — COMPREHENSIVE METABOLIC PANEL
ALT: 11 U/L (ref 0–44)
AST: 21 U/L (ref 15–41)
Albumin: 4 g/dL (ref 3.5–5.0)
Alkaline Phosphatase: 75 U/L (ref 38–126)
Anion gap: 10 (ref 5–15)
BUN: 14 mg/dL (ref 8–23)
CO2: 29 mmol/L (ref 22–32)
Calcium: 8.8 mg/dL — ABNORMAL LOW (ref 8.9–10.3)
Chloride: 101 mmol/L (ref 98–111)
Creatinine, Ser: 1.09 mg/dL — ABNORMAL HIGH (ref 0.44–1.00)
GFR calc Af Amer: 51 mL/min — ABNORMAL LOW (ref >60)
GFR calc non Af Amer: 44 mL/min — ABNORMAL LOW (ref >60)
Glucose, Bld: 105 mg/dL — ABNORMAL HIGH (ref 70–99)
Potassium: 3.6 mmol/L (ref 3.5–5.1)
Sodium: 140 mmol/L (ref 135–145)
Total Bilirubin: 0.7 mg/dL (ref 0.3–1.2)
Total Protein: 7 g/dL (ref 6.5–8.1)

## 2019-11-04 LAB — BRAIN NATRIURETIC PEPTIDE: B Natriuretic Peptide: 649.5 pg/mL — ABNORMAL HIGH (ref 0.0–100.0)

## 2019-11-04 LAB — PROTIME-INR
INR: 1.4 — ABNORMAL HIGH (ref 0.8–1.2)
Prothrombin Time: 17.3 seconds — ABNORMAL HIGH (ref 11.4–15.2)

## 2019-11-04 LAB — I-STAT CHEM 8, ED
BUN: 13 mg/dL (ref 8–23)
Calcium, Ion: 1.16 mmol/L (ref 1.15–1.40)
Chloride: 97 mmol/L — ABNORMAL LOW (ref 98–111)
Creatinine, Ser: 1.1 mg/dL — ABNORMAL HIGH (ref 0.44–1.00)
Glucose, Bld: 100 mg/dL — ABNORMAL HIGH (ref 70–99)
HCT: 30 % — ABNORMAL LOW (ref 36.0–46.0)
Hemoglobin: 10.2 g/dL — ABNORMAL LOW (ref 12.0–15.0)
Potassium: 3.6 mmol/L (ref 3.5–5.1)
Sodium: 137 mmol/L (ref 135–145)
TCO2: 31 mmol/L (ref 22–32)

## 2019-11-04 MED ORDER — FUROSEMIDE 20 MG PO TABS: 40.0000 mg | ORAL_TABLET | Freq: Every day | ORAL | 0 refills | Status: DC

## 2019-11-04 MED ORDER — FUROSEMIDE 10 MG/ML IJ SOLN
40.0000 mg | Freq: Once | INTRAMUSCULAR | Status: AC
  Administered 2019-11-04: 21:00:00 40 mg via INTRAVENOUS
  Filled 2019-11-04: qty 4

## 2019-11-04 NOTE — ED Notes (Signed)
[facility] Called and paperwork printed.

## 2019-11-04 NOTE — ED Provider Notes (Signed)
Nancy [facility] DEPT Provider Note   CSN: [id number] Arrival date & time: 11/04/19  1714     History Chief Complaint  Patient presents with  . pedal edema    Nancy Bush is a 86 y.o. female.  86 yo F here with a chief complaint of difficulty urinating.  States that she feels that she needs to urinate but only few drops come out.  Going on for a few days now.  Was actually seen in the ED a couple days ago for the same.  States that is continued even after her visit.  She also has some lower extremity edema that she thinks is worse than her baseline.  Denies significant shortness of breath denies chest pain.  Denies cough or fever.  Has some mild lower abdominal discomfort.  The history is provided by the patient.  Illness Severity:  Moderate Onset quality:  Gradual Duration:  2 days Timing:  Constant Progression:  Worsening Chronicity:  New Associated symptoms: no chest pain, no congestion, no fever, no headaches, no myalgias, no nausea, no rhinorrhea, no shortness of breath, no vomiting and no wheezing        Past Medical History:  Diagnosis Date  . Asthma   . Atrial fibrillation ([place])   . CHF (congestive heart failure) ([place])   . COPD (chronic obstructive pulmonary disease) ([place])   . H/O: hysterectomy   . Hypertension   . LBBB (left bundle branch block)    per discharge note 2003  . Mitral regurgitation    MILD  . MVP (mitral valve prolapse)   . Pacemaker   . Pulmonary hypertension ([place])    MILD TO MODERATE BY ECHO  . PVC (premature ventricular contraction)   . Rheumatoid arthritis(714.0)   . S/P Maze operation for atrial fibrillation 05/18/2012   Complete biatrial lesion set using cryothermy with oversewing of LA appendage  . S/P mitral valve replacement with bioprosthetic valve 05/18/2012   2mm Edwards Magna Mitral pericardial bioprosthesis     Patient Active Problem List   Diagnosis Date Noted  . CHF exacerbation ([place])  10/26/2019  . Educated about COVID-19 virus infection 03/20/2019  . Hypotension 03/18/2019  . S/P MVR (mitral valve repair) 03/18/2019  . Pancytopenia ([place]) 05/21/2018  . TGA (transient global amnesia) 05/19/2018  . Essential hypertension 12/11/2017  . Medication management 12/11/2017  . Pulmonary HTN ([place]) 12/30/2016  . Protein calorie malnutrition ([place]) 08/22/2012  . CAP (community acquired pneumonia) 08/21/2012  . Hypokalemia 08/21/2012  . SOB (shortness of breath) 08/20/2012  . FTT (failure to thrive) in adult 08/20/2012  . Leucocytosis 08/20/2012  . Atrial flutter ([place]) 06/24/2012  . CKD (chronic kidney disease), stage III 06/24/2012  . Pleural effusion 06/24/2012  . [employer] 05/25/2012  . Bradycardia 05/21/2012  . S/P mitral valve replacement with bioprosthetic valve 05/18/2012  . S/P Maze operation for atrial fibrillation 05/18/2012  . Severe mitral regurgitation 05/17/2012  . A-fib ([place]) 05/17/2012  . MR (mitral regurgitation) 05/03/2012  . Fatigue 01/29/2012  . Acute on chronic diastolic heart failure ([place]) 06/10/2011  . CKD (chronic kidney disease) 06/03/2011  . Encounter for long-term (current) use of anticoagulants 05/26/2011  . Atrial fibrillation ([place]) 05/19/2011  . CHEST PAIN UNSPECIFIED 08/28/2010  . ABNORMAL ELECTROCARDIOGRAM 11/14/2009  . Mitral Regurgitation 11/12/2009  . PULMONARY HYPERTENSION 11/12/2009  . Mitral valve disorder 11/12/2009  . SINUSITIS, CHRONIC 06/12/2007  . ALLERGIC RHINITIS 06/12/2007  . BRONCHITIS 06/12/2007  . COPD 06/12/2007    Past  Surgical History:  Procedure Laterality Date  . ABDOMINAL HYSTERECTOMY    . APPENDECTOMY    . CATARACT EXTRACTION    . CHOLECYSTECTOMY    . hysterectomy    . MAZE  05/18/2012   Procedure: MAZE;  Surgeon: Rexene Alberts, MD;  Location: [facility];  Service: Open Heart Surgery;  Laterality: Right;  . MITRAL VALVE REPLACEMENT  05/18/2012   Procedure: MINIMALLY INVASIVE MITRAL VALVE  (MV) REPLACEMENT;  Surgeon: Rexene Alberts, MD;  Location: [facility];  Service: Open Heart Surgery;  Laterality: N/A;  . OVARIAN CYST REMOVAL    . PACEMAKER INSERTION    . PERMANENT PACEMAKER INSERTION N/A 05/21/2012   Procedure: PERMANENT PACEMAKER INSERTION;  Surgeon: Evans Lance, MD;  Location: [facility] CATH LAB;  Service: Cardiovascular;  Laterality: N/A;  . TEE WITHOUT CARDIOVERSION  02/09/2012   Procedure: TRANSESOPHAGEAL ECHOCARDIOGRAM (TEE);  Surgeon: Fay Records, MD;  Location[facility] ENDOSCOPY;  Service: Cardiovascular;  Laterality: N/A;     OB History   No obstetric history on file.     No family history on file.  Social History   Tobacco Use  . Smoking status: Never Smoker  . Smokeless tobacco: Never Used  Substance Use Topics  . Alcohol use: No  . Drug use: No    Home Medications Prior to Admission medications   Medication Sig Start Date End Date Taking? Authorizing Provider  albuterol (VENTOLIN HFA) 108 (90 Base) MCG/ACT inhaler Inhale 2 puffs into the lungs every 6 (six) hours as needed for wheezing or shortness of breath.  10/11/19   [provider]  apixaban (ELIQUIS) 2.5 MG TABS tablet Take 1 tablet (2.5 mg total) by mouth 2 (two) times daily. 08/30/19   Minus Breeding, MD  furosemide (LASIX) 20 MG tablet Take 2 tablets (40 mg total) by mouth daily. 11/04/19   Deno Etienne, DO  metoprolol succinate (TOPROL-XL) 25 MG 24 hr tablet Take 12.5 mg by mouth daily.    [provider]  montelukast (SINGULAIR) 10 MG tablet Take 10 mg by mouth daily.  12/31/12   [provider]  Multiple Vitamins-Minerals (PRESERVISION AREDS PO) Take 1 tablet by mouth 2 (two) times daily. Preservision Areds     [provider]  nitroGLYCERIN (NITROSTAT) 0.4 MG SL tablet Place 0.4 mg under the tongue every 5 (five) minutes as needed for chest pain.     [provider]  polycarbophil (FIBERCON) 625 MG tablet Take 312.5 mg by mouth daily. With 8 oz of water     [provider]  polyethylene glycol (MIRALAX / GLYCOLAX) 17 g packet Take 34 g by mouth daily. With 8 oz of liquid    [provider]  predniSONE (DELTASONE) 5 MG tablet Take 2.5 mg by mouth every other day.    [provider]    Allergies    Promethazine hcl, Septra [sulfamethoxazole-trimethoprim], Sulfonamide derivatives, and Tequin  Review of Systems   Review of Systems  Constitutional: Negative for chills and fever.  HENT: Negative for congestion and rhinorrhea.   Eyes: Negative for redness and visual disturbance.  Respiratory: Negative for shortness of breath and wheezing.   Cardiovascular: Positive for leg swelling. Negative for chest pain and palpitations.  Gastrointestinal: Negative for nausea and vomiting.  Genitourinary: Positive for difficulty urinating and frequency. Negative for dysuria and urgency.  Musculoskeletal: Negative for arthralgias and myalgias.  Skin: Negative for pallor and wound.  Neurological: Negative for dizziness and headaches.    Physical Exam  Updated Vital Signs BP (!) 135/59   Pulse 65   Temp 97.7 F (36.5 C) (Oral)   Resp 16   Ht 4\' 11"  (1.499 m)   Wt 47.2 kg   SpO2 99%   BMI 21.01 kg/m   Physical Exam Vitals and nursing note reviewed.  Constitutional:      General: She is not in acute distress.    Appearance: She is well-developed. She is not diaphoretic.  HENT:     Head: Normocephalic and atraumatic.  Eyes:     Pupils: Pupils are equal, round, and reactive to light.  Cardiovascular:     Rate and Rhythm: Normal rate and regular rhythm.     Heart sounds: No murmur. No friction rub. No gallop.   Pulmonary:     Effort: Pulmonary effort is normal.     Breath sounds: No wheezing or rales.  Abdominal:     General: There is no distension.     Palpations: Abdomen is soft.     Tenderness: There is abdominal tenderness (mild suprapubic discomfort).  Musculoskeletal:        General: No tenderness.     Cervical  back: Normal range of motion and neck supple.  Skin:    General: Skin is warm and dry.  Neurological:     Mental Status: She is alert and oriented to person, place, and time.  Psychiatric:        Behavior: Behavior normal.     ED Results / Procedures / Treatments   Labs (all labs ordered are listed, but only abnormal results are displayed) Labs Reviewed  CBC WITH DIFFERENTIAL/PLATELET - Abnormal; Notable for the following components:      Result Value   WBC 2.8 (*)    RBC 3.34 (*)    Hemoglobin 9.6 (*)    HCT 31.3 (*)    Platelets 98 (*)    Neutro Abs 1.2 (*)    All other components within normal limits  COMPREHENSIVE METABOLIC PANEL - Abnormal; Notable for the following components:   Glucose, Bld 105 (*)    Creatinine, Ser 1.09 (*)    Calcium 8.8 (*)    GFR calc non Af Amer 44 (*)    GFR calc Af Amer 51 (*)    All other components within normal limits  BRAIN NATRIURETIC PEPTIDE - Abnormal; Notable for the following components:   B Natriuretic Peptide 649.5 (*)    All other components within normal limits  URINALYSIS, ROUTINE W REFLEX MICROSCOPIC - Abnormal; Notable for the following components:   Protein, ur 30 (*)    All other components within normal limits  PROTIME-INR - Abnormal; Notable for the following components:   Prothrombin Time 17.3 (*)    INR 1.4 (*)    All other components within normal limits  I-STAT CHEM 8, ED - Abnormal; Notable for the following components:   Chloride 97 (*)    Creatinine, Ser 1.10 (*)    Glucose, Bld 100 (*)    Hemoglobin 10.2 (*)    HCT 30.0 (*)    All other components within normal limits    EKG EKG Interpretation  Date/Time:  Friday November 04 2019 17:40:56 EDT Ventricular Rate:  60 PR Interval:    QRS Duration: 147 QT Interval:  490 QTC Calculation: 490 R Axis:   -86 Text Interpretation: Atrial-paced complexes Borderline prolonged PR interval IVCD, consider atypical RBBB Left ventricular hypertrophy Anterolateral  infarct, age indeterminate No significant change since last tracing Confirmed by  Deno Etienne [phone]) on 11/04/2019 5:44:55 PM   Radiology DG Chest 2 View  Result Date: 11/04/2019 CLINICAL DATA:  Lower extremity edema. EXAM: CHEST - 2 VIEW COMPARISON:  10/31/2019 FINDINGS: The pacer wires are stable. The cardiac silhouette, mediastinal and hilar contours are unchanged. Mild stable cardiac enlargement. Prosthetic mitral valve. Persistent left pleural effusion with overlying atelectasis. Mild vascular congestion but no overt pulmonary edema. No pulmonary infiltrates or lesions are identified. The bony thorax is intact. IMPRESSION: Persistent left pleural effusion and overlying atelectasis. Electronically Signed   By: Marijo Sanes M.D.   On: 11/04/2019 18:06    Procedures Procedures (including critical care time)  Medications Ordered in ED Medications  furosemide (LASIX) injection 40 mg (40 mg Intravenous Given 11/04/19 2042)    ED Course  I have reviewed the triage vital signs and the nursing notes.  Pertinent labs & imaging results that were available during my care of the patient were reviewed by me and considered in my medical decision making (see chart for details).    MDM Rules/Calculators/A&P                      86 yo F here with a chief complaints of difficulty urinating.  This is her second visit for this now.  Last visit she had a laboratory evaluation and CT scan.  Patient feels that is unchanged.  Will obtain bladder scan.  UA lab work reassess.  UA negative for infection.  Patient's renal function is essentially unchanged from prior.  Potassium is on the low end of normal.  Given a dose of Lasix with improvement of the patient's urine output.  Discussed results with her.  We will have her double lasix at home.  PCP follow-up.  11:03 PM:  I have discussed the diagnosis/risks/treatment options with the patient and believe the pt to be eligible for discharge home to follow-up with  PCP. We also discussed returning to the ED immediately if new or worsening sx occur. We discussed the sx which are most concerning (e.g., sudden worsening pain, fever, inability to tolerate by mouth) that necessitate immediate return. Medications administered to the patient during their visit and any new prescriptions provided to the patient are listed below.  Medications given during this visit Medications  furosemide (LASIX) injection 40 mg (40 mg Intravenous Given 11/04/19 2042)     The patient appears reasonably screen and/or stabilized for discharge and I doubt any other medical condition or other [facility] requiring further screening, evaluation, or treatment in the ED at this time prior to discharge.   Final Clinical Impression(s) / ED Diagnoses Final diagnoses:  Decreased urine output    Rx / DC Orders ED Discharge Orders         Ordered    furosemide (LASIX) 20 MG tablet  Daily     11/04/19 2256           Deno Etienne, DO 11/04/19 2303

## 2019-11-04 NOTE — ED Triage Notes (Signed)
86 yo female from [facility] with increased pedal edema since last week. Pt was seen here for same reason. Hx of RA, CHF, Asthma and Afib. Denies SHOB or chest pain. Pt is a DNR. Ambulated to EMS stretcher without distress.

## 2019-11-04 NOTE — ED Notes (Signed)
Pt ambulatory to bedside toilet and sink without concerns.

## 2019-11-04 NOTE — ED Notes (Addendum)
Report given to Robin at [facility].

## 2019-11-04 NOTE — Discharge Instructions (Signed)
Call your doctor on Monday.  Over the weekend please double your current dose of Lasix.  If you are taking 2 tablets a day then go to 4 tablets a day.  You are taking 1 tablet a take 2.  Please return for worsening symptoms fever or inability to eat or drink.

## 2019-11-04 NOTE — ED Notes (Signed)
Post void bladder scan showed 15ml x3 scans.

## 2019-11-04 NOTE — ED Notes (Signed)
Spoke to family remember and gave update.

## 2019-11-04 NOTE — ED Notes (Signed)
Bladder scan x2 with results as follows: 19mL 57mL.

## 2019-11-05 DIAGNOSIS — Z743 Need for continuous supervision: Secondary | ICD-10-CM | POA: Diagnosis not present

## 2019-11-05 DIAGNOSIS — R279 Unspecified lack of coordination: Secondary | ICD-10-CM | POA: Diagnosis not present

## 2019-11-05 DIAGNOSIS — R609 Edema, unspecified: Secondary | ICD-10-CM | POA: Diagnosis not present

## 2019-11-05 DIAGNOSIS — R4182 Altered mental status, unspecified: Secondary | ICD-10-CM | POA: Diagnosis not present

## 2019-11-07 DIAGNOSIS — M069 Rheumatoid arthritis, unspecified: Secondary | ICD-10-CM | POA: Diagnosis not present

## 2019-11-07 DIAGNOSIS — I447 Left bundle-branch block, unspecified: Secondary | ICD-10-CM | POA: Diagnosis not present

## 2019-11-07 DIAGNOSIS — I130 Hypertensive heart and chronic kidney disease with heart failure and stage 1 through stage 4 chronic kidney disease, or unspecified chronic kidney disease: Secondary | ICD-10-CM | POA: Diagnosis not present

## 2019-11-07 DIAGNOSIS — Z950 Presence of cardiac pacemaker: Secondary | ICD-10-CM | POA: Diagnosis not present

## 2019-11-07 DIAGNOSIS — N183 Chronic kidney disease, stage 3 unspecified: Secondary | ICD-10-CM | POA: Diagnosis not present

## 2019-11-07 DIAGNOSIS — Z954 Presence of other heart-valve replacement: Secondary | ICD-10-CM | POA: Diagnosis not present

## 2019-11-07 DIAGNOSIS — I4891 Unspecified atrial fibrillation: Secondary | ICD-10-CM | POA: Diagnosis not present

## 2019-11-07 DIAGNOSIS — J302 Other seasonal allergic rhinitis: Secondary | ICD-10-CM | POA: Diagnosis not present

## 2019-11-07 DIAGNOSIS — I071 Rheumatic tricuspid insufficiency: Secondary | ICD-10-CM | POA: Diagnosis not present

## 2019-11-07 DIAGNOSIS — J42 Unspecified chronic bronchitis: Secondary | ICD-10-CM | POA: Diagnosis not present

## 2019-11-07 DIAGNOSIS — D631 Anemia in chronic kidney disease: Secondary | ICD-10-CM | POA: Diagnosis not present

## 2019-11-07 DIAGNOSIS — I509 Heart failure, unspecified: Secondary | ICD-10-CM | POA: Diagnosis not present

## 2019-11-07 DIAGNOSIS — I272 Pulmonary hypertension, unspecified: Secondary | ICD-10-CM | POA: Diagnosis not present

## 2019-11-07 DIAGNOSIS — R627 Adult failure to thrive: Secondary | ICD-10-CM | POA: Diagnosis not present

## 2019-11-08 DIAGNOSIS — I509 Heart failure, unspecified: Secondary | ICD-10-CM | POA: Diagnosis not present

## 2019-11-08 DIAGNOSIS — J42 Unspecified chronic bronchitis: Secondary | ICD-10-CM | POA: Diagnosis not present

## 2019-11-08 DIAGNOSIS — I130 Hypertensive heart and chronic kidney disease with heart failure and stage 1 through stage 4 chronic kidney disease, or unspecified chronic kidney disease: Secondary | ICD-10-CM | POA: Diagnosis not present

## 2019-11-08 DIAGNOSIS — N183 Chronic kidney disease, stage 3 unspecified: Secondary | ICD-10-CM | POA: Diagnosis not present

## 2019-11-08 DIAGNOSIS — I4891 Unspecified atrial fibrillation: Secondary | ICD-10-CM | POA: Diagnosis not present

## 2019-11-08 DIAGNOSIS — I272 Pulmonary hypertension, unspecified: Secondary | ICD-10-CM | POA: Diagnosis not present

## 2019-11-10 DIAGNOSIS — I272 Pulmonary hypertension, unspecified: Secondary | ICD-10-CM | POA: Diagnosis not present

## 2019-11-10 DIAGNOSIS — N183 Chronic kidney disease, stage 3 unspecified: Secondary | ICD-10-CM | POA: Diagnosis not present

## 2019-11-10 DIAGNOSIS — J42 Unspecified chronic bronchitis: Secondary | ICD-10-CM | POA: Diagnosis not present

## 2019-11-10 DIAGNOSIS — I130 Hypertensive heart and chronic kidney disease with heart failure and stage 1 through stage 4 chronic kidney disease, or unspecified chronic kidney disease: Secondary | ICD-10-CM | POA: Diagnosis not present

## 2019-11-10 DIAGNOSIS — I4891 Unspecified atrial fibrillation: Secondary | ICD-10-CM | POA: Diagnosis not present

## 2019-11-10 DIAGNOSIS — I509 Heart failure, unspecified: Secondary | ICD-10-CM | POA: Diagnosis not present

## 2019-11-14 NOTE — Progress Notes (Signed)
Error. No show.

## 2019-11-15 ENCOUNTER — Encounter: Payer: Medicare Other | Admitting: Adult Health

## 2019-11-17 DIAGNOSIS — I509 Heart failure, unspecified: Secondary | ICD-10-CM | POA: Diagnosis not present

## 2019-11-17 DIAGNOSIS — I4891 Unspecified atrial fibrillation: Secondary | ICD-10-CM | POA: Diagnosis not present

## 2019-11-17 DIAGNOSIS — I272 Pulmonary hypertension, unspecified: Secondary | ICD-10-CM | POA: Diagnosis not present

## 2019-11-17 DIAGNOSIS — J42 Unspecified chronic bronchitis: Secondary | ICD-10-CM | POA: Diagnosis not present

## 2019-11-17 DIAGNOSIS — I130 Hypertensive heart and chronic kidney disease with heart failure and stage 1 through stage 4 chronic kidney disease, or unspecified chronic kidney disease: Secondary | ICD-10-CM | POA: Diagnosis not present

## 2019-11-17 DIAGNOSIS — N183 Chronic kidney disease, stage 3 unspecified: Secondary | ICD-10-CM | POA: Diagnosis not present

## 2019-11-22 ENCOUNTER — Encounter: Payer: Medicare Other | Admitting: Physician Assistant

## 2019-11-22 DIAGNOSIS — J42 Unspecified chronic bronchitis: Secondary | ICD-10-CM | POA: Diagnosis not present

## 2019-11-22 DIAGNOSIS — I130 Hypertensive heart and chronic kidney disease with heart failure and stage 1 through stage 4 chronic kidney disease, or unspecified chronic kidney disease: Secondary | ICD-10-CM | POA: Diagnosis not present

## 2019-11-22 DIAGNOSIS — I272 Pulmonary hypertension, unspecified: Secondary | ICD-10-CM | POA: Diagnosis not present

## 2019-11-22 DIAGNOSIS — I4891 Unspecified atrial fibrillation: Secondary | ICD-10-CM | POA: Diagnosis not present

## 2019-11-22 DIAGNOSIS — N183 Chronic kidney disease, stage 3 unspecified: Secondary | ICD-10-CM | POA: Diagnosis not present

## 2019-11-22 DIAGNOSIS — I509 Heart failure, unspecified: Secondary | ICD-10-CM | POA: Diagnosis not present

## 2019-11-23 DIAGNOSIS — I272 Pulmonary hypertension, unspecified: Secondary | ICD-10-CM | POA: Diagnosis not present

## 2019-11-23 DIAGNOSIS — I4891 Unspecified atrial fibrillation: Secondary | ICD-10-CM | POA: Diagnosis not present

## 2019-11-23 DIAGNOSIS — N183 Chronic kidney disease, stage 3 unspecified: Secondary | ICD-10-CM | POA: Diagnosis not present

## 2019-11-23 DIAGNOSIS — I509 Heart failure, unspecified: Secondary | ICD-10-CM | POA: Diagnosis not present

## 2019-11-23 DIAGNOSIS — I130 Hypertensive heart and chronic kidney disease with heart failure and stage 1 through stage 4 chronic kidney disease, or unspecified chronic kidney disease: Secondary | ICD-10-CM | POA: Diagnosis not present

## 2019-11-23 DIAGNOSIS — J42 Unspecified chronic bronchitis: Secondary | ICD-10-CM | POA: Diagnosis not present

## 2019-11-24 DIAGNOSIS — I4891 Unspecified atrial fibrillation: Secondary | ICD-10-CM | POA: Diagnosis not present

## 2019-11-24 DIAGNOSIS — J42 Unspecified chronic bronchitis: Secondary | ICD-10-CM | POA: Diagnosis not present

## 2019-11-24 DIAGNOSIS — I272 Pulmonary hypertension, unspecified: Secondary | ICD-10-CM | POA: Diagnosis not present

## 2019-11-24 DIAGNOSIS — I130 Hypertensive heart and chronic kidney disease with heart failure and stage 1 through stage 4 chronic kidney disease, or unspecified chronic kidney disease: Secondary | ICD-10-CM | POA: Diagnosis not present

## 2019-11-24 DIAGNOSIS — I509 Heart failure, unspecified: Secondary | ICD-10-CM | POA: Diagnosis not present

## 2019-11-24 DIAGNOSIS — N183 Chronic kidney disease, stage 3 unspecified: Secondary | ICD-10-CM | POA: Diagnosis not present

## 2019-11-29 DIAGNOSIS — I509 Heart failure, unspecified: Secondary | ICD-10-CM | POA: Diagnosis not present

## 2019-11-29 DIAGNOSIS — I130 Hypertensive heart and chronic kidney disease with heart failure and stage 1 through stage 4 chronic kidney disease, or unspecified chronic kidney disease: Secondary | ICD-10-CM | POA: Diagnosis not present

## 2019-11-29 DIAGNOSIS — N183 Chronic kidney disease, stage 3 unspecified: Secondary | ICD-10-CM | POA: Diagnosis not present

## 2019-11-29 DIAGNOSIS — I272 Pulmonary hypertension, unspecified: Secondary | ICD-10-CM | POA: Diagnosis not present

## 2019-11-29 DIAGNOSIS — I4891 Unspecified atrial fibrillation: Secondary | ICD-10-CM | POA: Diagnosis not present

## 2019-11-29 DIAGNOSIS — J42 Unspecified chronic bronchitis: Secondary | ICD-10-CM | POA: Diagnosis not present

## 2019-12-01 DIAGNOSIS — J42 Unspecified chronic bronchitis: Secondary | ICD-10-CM | POA: Diagnosis not present

## 2019-12-01 DIAGNOSIS — I509 Heart failure, unspecified: Secondary | ICD-10-CM | POA: Diagnosis not present

## 2019-12-01 DIAGNOSIS — I4891 Unspecified atrial fibrillation: Secondary | ICD-10-CM | POA: Diagnosis not present

## 2019-12-01 DIAGNOSIS — I130 Hypertensive heart and chronic kidney disease with heart failure and stage 1 through stage 4 chronic kidney disease, or unspecified chronic kidney disease: Secondary | ICD-10-CM | POA: Diagnosis not present

## 2019-12-01 DIAGNOSIS — I272 Pulmonary hypertension, unspecified: Secondary | ICD-10-CM | POA: Diagnosis not present

## 2019-12-01 DIAGNOSIS — N183 Chronic kidney disease, stage 3 unspecified: Secondary | ICD-10-CM | POA: Diagnosis not present

## 2019-12-03 DIAGNOSIS — R627 Adult failure to thrive: Secondary | ICD-10-CM | POA: Diagnosis not present

## 2019-12-03 DIAGNOSIS — I4891 Unspecified atrial fibrillation: Secondary | ICD-10-CM | POA: Diagnosis not present

## 2019-12-03 DIAGNOSIS — N183 Chronic kidney disease, stage 3 unspecified: Secondary | ICD-10-CM | POA: Diagnosis not present

## 2019-12-03 DIAGNOSIS — I272 Pulmonary hypertension, unspecified: Secondary | ICD-10-CM | POA: Diagnosis not present

## 2019-12-03 DIAGNOSIS — I071 Rheumatic tricuspid insufficiency: Secondary | ICD-10-CM | POA: Diagnosis not present

## 2019-12-03 DIAGNOSIS — I447 Left bundle-branch block, unspecified: Secondary | ICD-10-CM | POA: Diagnosis not present

## 2019-12-03 DIAGNOSIS — M069 Rheumatoid arthritis, unspecified: Secondary | ICD-10-CM | POA: Diagnosis not present

## 2019-12-03 DIAGNOSIS — I509 Heart failure, unspecified: Secondary | ICD-10-CM | POA: Diagnosis not present

## 2019-12-03 DIAGNOSIS — J302 Other seasonal allergic rhinitis: Secondary | ICD-10-CM | POA: Diagnosis not present

## 2019-12-03 DIAGNOSIS — D631 Anemia in chronic kidney disease: Secondary | ICD-10-CM | POA: Diagnosis not present

## 2019-12-03 DIAGNOSIS — J42 Unspecified chronic bronchitis: Secondary | ICD-10-CM | POA: Diagnosis not present

## 2019-12-03 DIAGNOSIS — Z954 Presence of other heart-valve replacement: Secondary | ICD-10-CM | POA: Diagnosis not present

## 2019-12-03 DIAGNOSIS — I130 Hypertensive heart and chronic kidney disease with heart failure and stage 1 through stage 4 chronic kidney disease, or unspecified chronic kidney disease: Secondary | ICD-10-CM | POA: Diagnosis not present

## 2019-12-03 DIAGNOSIS — Z950 Presence of cardiac pacemaker: Secondary | ICD-10-CM | POA: Diagnosis not present

## 2019-12-05 DIAGNOSIS — I4891 Unspecified atrial fibrillation: Secondary | ICD-10-CM | POA: Diagnosis not present

## 2019-12-05 DIAGNOSIS — I509 Heart failure, unspecified: Secondary | ICD-10-CM | POA: Diagnosis not present

## 2019-12-05 DIAGNOSIS — I272 Pulmonary hypertension, unspecified: Secondary | ICD-10-CM | POA: Diagnosis not present

## 2019-12-05 DIAGNOSIS — N183 Chronic kidney disease, stage 3 unspecified: Secondary | ICD-10-CM | POA: Diagnosis not present

## 2019-12-05 DIAGNOSIS — I130 Hypertensive heart and chronic kidney disease with heart failure and stage 1 through stage 4 chronic kidney disease, or unspecified chronic kidney disease: Secondary | ICD-10-CM | POA: Diagnosis not present

## 2019-12-05 DIAGNOSIS — J42 Unspecified chronic bronchitis: Secondary | ICD-10-CM | POA: Diagnosis not present

## 2019-12-07 DIAGNOSIS — R609 Edema, unspecified: Secondary | ICD-10-CM | POA: Diagnosis not present

## 2019-12-07 DIAGNOSIS — I509 Heart failure, unspecified: Secondary | ICD-10-CM | POA: Diagnosis not present

## 2019-12-07 DIAGNOSIS — K639 Disease of intestine, unspecified: Secondary | ICD-10-CM | POA: Diagnosis not present

## 2019-12-07 DIAGNOSIS — Z09 Encounter for follow-up examination after completed treatment for conditions other than malignant neoplasm: Secondary | ICD-10-CM | POA: Diagnosis not present

## 2019-12-08 DIAGNOSIS — N183 Chronic kidney disease, stage 3 unspecified: Secondary | ICD-10-CM | POA: Diagnosis not present

## 2019-12-08 DIAGNOSIS — J42 Unspecified chronic bronchitis: Secondary | ICD-10-CM | POA: Diagnosis not present

## 2019-12-08 DIAGNOSIS — I4891 Unspecified atrial fibrillation: Secondary | ICD-10-CM | POA: Diagnosis not present

## 2019-12-08 DIAGNOSIS — I272 Pulmonary hypertension, unspecified: Secondary | ICD-10-CM | POA: Diagnosis not present

## 2019-12-08 DIAGNOSIS — R6251 Failure to thrive (child): Secondary | ICD-10-CM | POA: Insufficient documentation

## 2019-12-08 DIAGNOSIS — I509 Heart failure, unspecified: Secondary | ICD-10-CM | POA: Diagnosis not present

## 2019-12-08 DIAGNOSIS — I130 Hypertensive heart and chronic kidney disease with heart failure and stage 1 through stage 4 chronic kidney disease, or unspecified chronic kidney disease: Secondary | ICD-10-CM | POA: Diagnosis not present

## 2019-12-08 NOTE — Progress Notes (Signed)
Virtual Visit via Telephone Note   This visit type was conducted due to national recommendations for restrictions regarding the COVID-19 Pandemic (e.g. social distancing) in an effort to limit this patient's exposure and mitigate transmission in our community.  Due to her co-morbid illnesses, this patient is at least at moderate risk for complications without adequate follow up.  This format is felt to be most appropriate for this patient at this time.  The patient did not have access to video technology/had technical difficulties with video requiring transitioning to audio format only (telephone).  All issues noted in this document were discussed and addressed.  No physical exam could be performed with this format.  Please refer to the patient's chart for her  consent to telehealth for [facility].   The patient was identified using 2 identifiers.  Date:  12/09/2019   ID:  Nancy Bush, DOB Sep 02, 1925, MRN [id number]  Patient Location: Home Provider Location: Home  PCP:  Lawerance Cruel, MD  Cardiologist:  Minus Breeding, MD  Electrophysiologist:  None   Evaluation Performed:  Follow-Up Visit  Chief Complaint:  Legs swollen.    History of Present Illness:    Nancy Bush is a 86 y.o. female with we are seeing post hospital follow up after admission for diastolic CHF and severe pulmonary HTN.She was discharged on 10/29/2019.   Chest x-ray showed moderate left pleural effusion but no evidence of pneumonia. She was admitted for CHF exacerbation and treated with IV Lasix. She was seen again in the ED on 11/04/2019 for difficulty with urination. She was given IV lasix. She was discharged after feeling better. She was found to be anemic with Hgb of 9.6/Hct 31.3. BNP 649. She was to double her lasix dose to 40 mg daily from 20 mg daily.   The patient has had some increased lower extremity swelling.  I was able to talk to the nurse tech who says that her legs are swollen.   Patient does describe occasional shortness of breath.  She had one episode of chest discomfort a couple of weeks ago and actually took a nitroglycerin.  She does try to keep her feet propped up but she does not have a recliner chair.  Hospice is coming to see her.  They were in contact with Dr. Harrington Challenger recently who just a couple of days ago increased her Lasix by adding an extra 20 mg.  She is not describing PND or orthopnea.  She is not having any palpitations, presyncope or syncope.  She did fall 3 nights ago slipping when she got up to step on above it.  She did not lose consciousness.  The patient does not have symptoms concerning for COVID-19 infection (fever, chills, cough, or new shortness of breath).    Past Medical History:  Diagnosis Date  . Asthma   . Atrial fibrillation ([place])   . CHF (congestive heart failure) ([place])   . COPD (chronic obstructive pulmonary disease) ([place])   . H/O: hysterectomy   . Hypertension   . LBBB (left bundle branch block)    per discharge note 2003  . Mitral regurgitation    MILD  . MVP (mitral valve prolapse)   . Pacemaker   . Pulmonary hypertension ([place])    MILD TO MODERATE BY ECHO  . PVC (premature ventricular contraction)   . Rheumatoid arthritis(714.0)   . S/P Maze operation for atrial fibrillation 05/18/2012   Complete biatrial lesion set using cryothermy with oversewing of LA appendage  .  S/P mitral valve replacement with bioprosthetic valve 05/18/2012   9mm Edwards Magna Mitral pericardial bioprosthesis    Past Surgical History:  Procedure Laterality Date  . ABDOMINAL HYSTERECTOMY    . APPENDECTOMY    . CATARACT EXTRACTION    . CHOLECYSTECTOMY    . hysterectomy    . MAZE  05/18/2012   Procedure: MAZE;  Surgeon: Rexene Alberts, MD;  Location: [place];  Service: Open Heart Surgery;  Laterality: Right;  . MITRAL VALVE REPLACEMENT  05/18/2012   Procedure: MINIMALLY INVASIVE MITRAL VALVE (MV) REPLACEMENT;  Surgeon: Rexene Alberts, MD;   Location: [place];  Service: Open Heart Surgery;  Laterality: N/A;  . OVARIAN CYST REMOVAL    . PACEMAKER INSERTION    . PERMANENT PACEMAKER INSERTION N/A 05/21/2012   Procedure: PERMANENT PACEMAKER INSERTION;  Surgeon: Evans Lance, MD;  Location: [facility] CATH LAB;  Service: Cardiovascular;  Laterality: N/A;  . TEE WITHOUT CARDIOVERSION  02/09/2012   Procedure: TRANSESOPHAGEAL ECHOCARDIOGRAM (TEE);  Surgeon: Fay Records, MD;  Location: [facility] ENDOSCOPY;  Service: Cardiovascular;  Laterality: N/A;     Current Meds  Medication Sig  . albuterol (VENTOLIN HFA) 108 (90 Base) MCG/ACT inhaler Inhale 2 puffs into the lungs every 6 (six) hours as needed for wheezing or shortness of breath.   Marland Kitchen apixaban (ELIQUIS) 2.5 MG TABS tablet Take 1 tablet (2.5 mg total) by mouth 2 (two) times daily.  . furosemide (LASIX) 20 MG tablet Take 2 tablets (40 mg total) by mouth daily.  . furosemide (LASIX) 20 MG tablet Take 20 mg by mouth every evening.  . metoprolol succinate (TOPROL-XL) 25 MG 24 hr tablet Take 12.5 mg by mouth daily.  . montelukast (SINGULAIR) 10 MG tablet Take 10 mg by mouth daily.   . Multiple Vitamins-Minerals (PRESERVISION AREDS PO) Take 1 tablet by mouth 2 (two) times daily. Preservision Areds   . nitroGLYCERIN (NITROSTAT) 0.4 MG SL tablet Place 0.4 mg under the tongue every 5 (five) minutes as needed for chest pain.   . polyethylene glycol (MIRALAX / GLYCOLAX) 17 g packet Take 34 g by mouth daily. With 8 oz of liquid  . predniSONE (DELTASONE) 5 MG tablet Take 2.5 mg by mouth every other day.  . [DISCONTINUED] polycarbophil (FIBERCON) 625 MG tablet Take 312.5 mg by mouth daily. With 8 oz of water     Allergies:   Promethazine hcl, Septra [sulfamethoxazole-trimethoprim], Sulfonamide derivatives, and Tequin   Social History   Tobacco Use  . Smoking status: Never Smoker  . Smokeless tobacco: Never Used  Substance Use Topics  . Alcohol use: No  . Drug use: No     Family Hx: The patient's family  history is not on file.  ROS:   Please see the history of present illness.     All other systems reviewed and are negative.   Prior CV studies:   The following studies were reviewed today:  None  Labs/Other Tests and Data Reviewed:    EKG:  No ECG reviewed.  Recent Labs: 10/29/2019: Magnesium 2.0 11/04/2019: ALT 11; B Natriuretic Peptide 649.5; BUN 13; Creatinine, Ser 1.10; Hemoglobin 10.2; Platelets 98; Potassium 3.6; Sodium 137   Recent Lipid Panel Lab Results  Component Value Date/Time   CHOL 98 05/20/2018 04:17 AM   TRIG 54 05/20/2018 04:17 AM   HDL 39 (L) 05/20/2018 04:17 AM   CHOLHDL 2.5 05/20/2018 04:17 AM   LDLCALC 48 05/20/2018 04:17 AM    Wt Readings from Last 3 Encounters:  11/04/19 104 lb (47.2 kg)  10/29/19 104 lb 6.4 oz (47.4 kg)  08/31/19 88 lb 6.4 oz (40.1 kg)     Objective:    Vital Signs:  BP 110/60   Pulse 65    VITAL SIGNS:  reviewed  ASSESSMENT & PLAN:    Paroxysmal atrial fibrillation: Patient is not had any since affected sustained tacky arrhythmias.  She is taking anticoagulation.  There is been no suggestion of bleeding.  No change in therapy.   Hypotension: Her blood pressure is controlled today.  She tolerates the meds as listed.  No change in therapy.  MVR/TR:   She had an echocardiogram in October 2019.  She does have severe TR but had stable mitral valve.  We are managing this automatically.   Failure to thrive :   I spoke with the staff who says she eats in her room and does not like to really leave.  However, I think she has adequate nutrition.  She is being followed by hospice.   Leg swelling: I had a long conversation with her.  In therapy with her staff.  We are contacting hospice.  We would like to get her a Geri chair or with [employer] chair so that she can elevate her legs above her heart.  We talked about fluid restriction.  COVID-19 Education:   The signs and symptoms of COVID-19 were discussed with the patient and how to  seek care for testing (follow up with PCP or arrange E-visit).   Time:   Today, I have spent 25 minutes with the patient with telehealth technology discussing the above problems.     Medication Adjustments/Labs and Tests Ordered: Current medicines are reviewed at length with the patient today.  Concerns regarding medicines are outlined above.   Tests Ordered: Orders Placed This Encounter  Procedures  . Basic metabolic panel    Medication Changes: No orders of the defined types were placed in this encounter.   Follow Up:  Virtual Visit  four months.  Signed, Minus Breeding, MD  12/09/2019 5:11 PM    [facility]

## 2019-12-09 ENCOUNTER — Telehealth (INDEPENDENT_AMBULATORY_CARE_PROVIDER_SITE_OTHER): Admitting: Cardiology

## 2019-12-09 VITALS — BP 110/60 | HR 65

## 2019-12-09 DIAGNOSIS — I959 Hypotension, unspecified: Secondary | ICD-10-CM

## 2019-12-09 DIAGNOSIS — Z7189 Other specified counseling: Secondary | ICD-10-CM

## 2019-12-09 DIAGNOSIS — R627 Adult failure to thrive: Secondary | ICD-10-CM | POA: Diagnosis not present

## 2019-12-09 DIAGNOSIS — Z79899 Other long term (current) drug therapy: Secondary | ICD-10-CM | POA: Diagnosis not present

## 2019-12-09 DIAGNOSIS — Z9889 Other specified postprocedural states: Secondary | ICD-10-CM

## 2019-12-09 DIAGNOSIS — I480 Paroxysmal atrial fibrillation: Secondary | ICD-10-CM

## 2019-12-09 DIAGNOSIS — R6251 Failure to thrive (child): Secondary | ICD-10-CM

## 2019-12-09 NOTE — Patient Instructions (Addendum)
Medication Instructions:  NO CHANGES *If you need a refill on your cardiac medications before your next appointment, please call your pharmacy*  Lab Work: Your physician recommends that have lab work drawn: [place] [phone]  Testing/Procedures: NONE ORDERED THIS VISIT  Follow-Up: At [facility], you and your health needs are our priority.  As part of our continuing mission to provide you with exceptional heart care, we have created designated Provider Care Teams.  These Care Teams include your primary Cardiologist (physician) and Advanced Practice Providers (APPs -  Physician Assistants and Nurse Practitioners) who all work together to provide you with the care you need, when you need it.  Your next appointment:   2 month(s) - PLEASE CALL [phone] TO SCHEDULE APPOINTMENT  The format for your next appointment:   Virtual Visit  - [place] PHONE CALL VISIT  Provider:   Minus Breeding, MD  Other Instructions Please ask hospice to provide recliner for patient [place]), elevate legs above heart when able.

## 2019-12-13 DIAGNOSIS — I4891 Unspecified atrial fibrillation: Secondary | ICD-10-CM | POA: Diagnosis not present

## 2019-12-13 DIAGNOSIS — I509 Heart failure, unspecified: Secondary | ICD-10-CM | POA: Diagnosis not present

## 2019-12-13 DIAGNOSIS — J42 Unspecified chronic bronchitis: Secondary | ICD-10-CM | POA: Diagnosis not present

## 2019-12-13 DIAGNOSIS — I272 Pulmonary hypertension, unspecified: Secondary | ICD-10-CM | POA: Diagnosis not present

## 2019-12-13 DIAGNOSIS — I130 Hypertensive heart and chronic kidney disease with heart failure and stage 1 through stage 4 chronic kidney disease, or unspecified chronic kidney disease: Secondary | ICD-10-CM | POA: Diagnosis not present

## 2019-12-13 DIAGNOSIS — N183 Chronic kidney disease, stage 3 unspecified: Secondary | ICD-10-CM | POA: Diagnosis not present

## 2019-12-22 DIAGNOSIS — I509 Heart failure, unspecified: Secondary | ICD-10-CM | POA: Diagnosis not present

## 2019-12-22 DIAGNOSIS — I4891 Unspecified atrial fibrillation: Secondary | ICD-10-CM | POA: Diagnosis not present

## 2019-12-22 DIAGNOSIS — J42 Unspecified chronic bronchitis: Secondary | ICD-10-CM | POA: Diagnosis not present

## 2019-12-22 DIAGNOSIS — N183 Chronic kidney disease, stage 3 unspecified: Secondary | ICD-10-CM | POA: Diagnosis not present

## 2019-12-22 DIAGNOSIS — I130 Hypertensive heart and chronic kidney disease with heart failure and stage 1 through stage 4 chronic kidney disease, or unspecified chronic kidney disease: Secondary | ICD-10-CM | POA: Diagnosis not present

## 2019-12-22 DIAGNOSIS — I272 Pulmonary hypertension, unspecified: Secondary | ICD-10-CM | POA: Diagnosis not present

## 2019-12-27 DIAGNOSIS — I272 Pulmonary hypertension, unspecified: Secondary | ICD-10-CM | POA: Diagnosis not present

## 2019-12-27 DIAGNOSIS — I130 Hypertensive heart and chronic kidney disease with heart failure and stage 1 through stage 4 chronic kidney disease, or unspecified chronic kidney disease: Secondary | ICD-10-CM | POA: Diagnosis not present

## 2019-12-27 DIAGNOSIS — I4891 Unspecified atrial fibrillation: Secondary | ICD-10-CM | POA: Diagnosis not present

## 2019-12-27 DIAGNOSIS — I509 Heart failure, unspecified: Secondary | ICD-10-CM | POA: Diagnosis not present

## 2019-12-27 DIAGNOSIS — J42 Unspecified chronic bronchitis: Secondary | ICD-10-CM | POA: Diagnosis not present

## 2019-12-27 DIAGNOSIS — N183 Chronic kidney disease, stage 3 unspecified: Secondary | ICD-10-CM | POA: Diagnosis not present

## 2019-12-28 ENCOUNTER — Telehealth: Payer: Self-pay | Admitting: Cardiology

## 2019-12-28 NOTE — Telephone Encounter (Signed)
   Pt c/o medication issue:  1. Name of Medication:   apixaban (ELIQUIS) 2.5 MG TABS tablet    2. How are you currently taking this medication (dosage and times per day)?   3. Are you having a reaction (difficulty breathing--STAT)?   4. What is your medication issue? Pt said Dr. Percival Spanish changed her eliquis to aspirin. She said she is not supposed to take aspirin and would like to discuss it with nurse. She said she will wait for call back before taking aspirin  Please advise

## 2019-12-28 NOTE — Telephone Encounter (Signed)
I did not stop her Eliquis.  It is not 25 mg but 2.5 mg bid and she should not be on ASA.

## 2019-12-28 NOTE — Telephone Encounter (Signed)
Spoke with pt who report she was advised by [facility] staff this morning that Dr. Warren Lacy stopped her Elquis and recommended pt start ASA. Per chart review on 5/7, MD recommended pt continue with current medication  Including Eliquis 25 mg BID. Pt state nursing staff gave her ASA this morning and will have them call our office.  Will route to MD for clarifications.

## 2019-12-28 NOTE — Telephone Encounter (Signed)
Left message to call back  

## 2019-12-29 DIAGNOSIS — J42 Unspecified chronic bronchitis: Secondary | ICD-10-CM | POA: Diagnosis not present

## 2019-12-29 DIAGNOSIS — M069 Rheumatoid arthritis, unspecified: Secondary | ICD-10-CM | POA: Diagnosis not present

## 2019-12-29 DIAGNOSIS — D61818 Other pancytopenia: Secondary | ICD-10-CM | POA: Diagnosis not present

## 2019-12-29 DIAGNOSIS — I4891 Unspecified atrial fibrillation: Secondary | ICD-10-CM | POA: Diagnosis not present

## 2019-12-29 DIAGNOSIS — J45909 Unspecified asthma, uncomplicated: Secondary | ICD-10-CM | POA: Diagnosis not present

## 2019-12-29 DIAGNOSIS — N183 Chronic kidney disease, stage 3 unspecified: Secondary | ICD-10-CM | POA: Diagnosis not present

## 2019-12-29 DIAGNOSIS — I509 Heart failure, unspecified: Secondary | ICD-10-CM | POA: Diagnosis not present

## 2019-12-29 DIAGNOSIS — I130 Hypertensive heart and chronic kidney disease with heart failure and stage 1 through stage 4 chronic kidney disease, or unspecified chronic kidney disease: Secondary | ICD-10-CM | POA: Diagnosis not present

## 2019-12-29 DIAGNOSIS — I272 Pulmonary hypertension, unspecified: Secondary | ICD-10-CM | POA: Diagnosis not present

## 2020-01-03 NOTE — Telephone Encounter (Signed)
Spoke with Rip Harbour at patient's facility. Patient's Eliquis was discontinued because patient is now on hospice and medication is too expensive. Rip Harbour had questions on aspirin dosage. Staff to follow up with Primary physician per Dr. Percival Spanish for recommendation on Aspirin dosage as they are the ones who have changed the medication. No further questions at this time.

## 2020-01-17 ENCOUNTER — Ambulatory Visit (INDEPENDENT_AMBULATORY_CARE_PROVIDER_SITE_OTHER): Payer: Medicare Other | Admitting: *Deleted

## 2020-01-17 DIAGNOSIS — I480 Paroxysmal atrial fibrillation: Secondary | ICD-10-CM

## 2020-01-18 LAB — CUP PACEART REMOTE DEVICE CHECK
Date Time Interrogation Session: 20210616062319
Implantable Lead Implant Date: 20131018
Implantable Lead Implant Date: 20131018
Implantable Lead Location: 753859
Implantable Lead Location: 753860
Implantable Lead Model: 4135
Implantable Lead Model: 4136
Implantable Lead Serial Number: 29232066
Implantable Lead Serial Number: 29233089
Implantable Pulse Generator Implant Date: 20131018
Pulse Gen Serial Number: 111608

## 2020-01-19 NOTE — Progress Notes (Signed)
Remote pacemaker transmission.   

## 2020-02-02 DIAGNOSIS — I130 Hypertensive heart and chronic kidney disease with heart failure and stage 1 through stage 4 chronic kidney disease, or unspecified chronic kidney disease: Secondary | ICD-10-CM | POA: Diagnosis not present

## 2020-02-02 DIAGNOSIS — I4891 Unspecified atrial fibrillation: Secondary | ICD-10-CM | POA: Diagnosis not present

## 2020-02-02 DIAGNOSIS — M069 Rheumatoid arthritis, unspecified: Secondary | ICD-10-CM | POA: Diagnosis not present

## 2020-02-02 DIAGNOSIS — J42 Unspecified chronic bronchitis: Secondary | ICD-10-CM | POA: Diagnosis not present

## 2020-02-02 DIAGNOSIS — Z954 Presence of other heart-valve replacement: Secondary | ICD-10-CM | POA: Diagnosis not present

## 2020-02-02 DIAGNOSIS — Z950 Presence of cardiac pacemaker: Secondary | ICD-10-CM | POA: Diagnosis not present

## 2020-02-02 DIAGNOSIS — N183 Chronic kidney disease, stage 3 unspecified: Secondary | ICD-10-CM | POA: Diagnosis not present

## 2020-02-02 DIAGNOSIS — D631 Anemia in chronic kidney disease: Secondary | ICD-10-CM | POA: Diagnosis not present

## 2020-02-02 DIAGNOSIS — I272 Pulmonary hypertension, unspecified: Secondary | ICD-10-CM | POA: Diagnosis not present

## 2020-02-02 DIAGNOSIS — I071 Rheumatic tricuspid insufficiency: Secondary | ICD-10-CM | POA: Diagnosis not present

## 2020-02-02 DIAGNOSIS — R627 Adult failure to thrive: Secondary | ICD-10-CM | POA: Diagnosis not present

## 2020-02-02 DIAGNOSIS — I447 Left bundle-branch block, unspecified: Secondary | ICD-10-CM | POA: Diagnosis not present

## 2020-02-02 DIAGNOSIS — I509 Heart failure, unspecified: Secondary | ICD-10-CM | POA: Diagnosis not present

## 2020-02-02 DIAGNOSIS — J302 Other seasonal allergic rhinitis: Secondary | ICD-10-CM | POA: Diagnosis not present

## 2020-02-06 DIAGNOSIS — I272 Pulmonary hypertension, unspecified: Secondary | ICD-10-CM | POA: Diagnosis not present

## 2020-02-06 DIAGNOSIS — I130 Hypertensive heart and chronic kidney disease with heart failure and stage 1 through stage 4 chronic kidney disease, or unspecified chronic kidney disease: Secondary | ICD-10-CM | POA: Diagnosis not present

## 2020-02-06 DIAGNOSIS — I509 Heart failure, unspecified: Secondary | ICD-10-CM | POA: Diagnosis not present

## 2020-02-06 DIAGNOSIS — I4891 Unspecified atrial fibrillation: Secondary | ICD-10-CM | POA: Diagnosis not present

## 2020-02-06 DIAGNOSIS — N183 Chronic kidney disease, stage 3 unspecified: Secondary | ICD-10-CM | POA: Diagnosis not present

## 2020-02-06 DIAGNOSIS — J42 Unspecified chronic bronchitis: Secondary | ICD-10-CM | POA: Diagnosis not present

## 2020-02-10 DIAGNOSIS — I4891 Unspecified atrial fibrillation: Secondary | ICD-10-CM | POA: Diagnosis not present

## 2020-02-10 DIAGNOSIS — N183 Chronic kidney disease, stage 3 unspecified: Secondary | ICD-10-CM | POA: Diagnosis not present

## 2020-02-10 DIAGNOSIS — I130 Hypertensive heart and chronic kidney disease with heart failure and stage 1 through stage 4 chronic kidney disease, or unspecified chronic kidney disease: Secondary | ICD-10-CM | POA: Diagnosis not present

## 2020-02-10 DIAGNOSIS — I509 Heart failure, unspecified: Secondary | ICD-10-CM | POA: Diagnosis not present

## 2020-02-10 DIAGNOSIS — J42 Unspecified chronic bronchitis: Secondary | ICD-10-CM | POA: Diagnosis not present

## 2020-02-10 DIAGNOSIS — I272 Pulmonary hypertension, unspecified: Secondary | ICD-10-CM | POA: Diagnosis not present

## 2020-02-15 DIAGNOSIS — N183 Chronic kidney disease, stage 3 unspecified: Secondary | ICD-10-CM | POA: Diagnosis not present

## 2020-02-15 DIAGNOSIS — J42 Unspecified chronic bronchitis: Secondary | ICD-10-CM | POA: Diagnosis not present

## 2020-02-15 DIAGNOSIS — I509 Heart failure, unspecified: Secondary | ICD-10-CM | POA: Diagnosis not present

## 2020-02-15 DIAGNOSIS — I272 Pulmonary hypertension, unspecified: Secondary | ICD-10-CM | POA: Diagnosis not present

## 2020-02-15 DIAGNOSIS — I130 Hypertensive heart and chronic kidney disease with heart failure and stage 1 through stage 4 chronic kidney disease, or unspecified chronic kidney disease: Secondary | ICD-10-CM | POA: Diagnosis not present

## 2020-02-15 DIAGNOSIS — I4891 Unspecified atrial fibrillation: Secondary | ICD-10-CM | POA: Diagnosis not present

## 2020-02-16 DIAGNOSIS — I509 Heart failure, unspecified: Secondary | ICD-10-CM | POA: Diagnosis not present

## 2020-02-16 DIAGNOSIS — I4891 Unspecified atrial fibrillation: Secondary | ICD-10-CM | POA: Diagnosis not present

## 2020-02-16 DIAGNOSIS — J42 Unspecified chronic bronchitis: Secondary | ICD-10-CM | POA: Diagnosis not present

## 2020-02-16 DIAGNOSIS — N183 Chronic kidney disease, stage 3 unspecified: Secondary | ICD-10-CM | POA: Diagnosis not present

## 2020-02-16 DIAGNOSIS — I130 Hypertensive heart and chronic kidney disease with heart failure and stage 1 through stage 4 chronic kidney disease, or unspecified chronic kidney disease: Secondary | ICD-10-CM | POA: Diagnosis not present

## 2020-02-16 DIAGNOSIS — I272 Pulmonary hypertension, unspecified: Secondary | ICD-10-CM | POA: Diagnosis not present

## 2020-02-21 DIAGNOSIS — I272 Pulmonary hypertension, unspecified: Secondary | ICD-10-CM | POA: Diagnosis not present

## 2020-02-21 DIAGNOSIS — I4891 Unspecified atrial fibrillation: Secondary | ICD-10-CM | POA: Diagnosis not present

## 2020-02-21 DIAGNOSIS — I130 Hypertensive heart and chronic kidney disease with heart failure and stage 1 through stage 4 chronic kidney disease, or unspecified chronic kidney disease: Secondary | ICD-10-CM | POA: Diagnosis not present

## 2020-02-21 DIAGNOSIS — I509 Heart failure, unspecified: Secondary | ICD-10-CM | POA: Diagnosis not present

## 2020-02-21 DIAGNOSIS — J42 Unspecified chronic bronchitis: Secondary | ICD-10-CM | POA: Diagnosis not present

## 2020-02-21 DIAGNOSIS — N183 Chronic kidney disease, stage 3 unspecified: Secondary | ICD-10-CM | POA: Diagnosis not present

## 2020-02-22 DIAGNOSIS — I130 Hypertensive heart and chronic kidney disease with heart failure and stage 1 through stage 4 chronic kidney disease, or unspecified chronic kidney disease: Secondary | ICD-10-CM | POA: Diagnosis not present

## 2020-02-22 DIAGNOSIS — I4891 Unspecified atrial fibrillation: Secondary | ICD-10-CM | POA: Diagnosis not present

## 2020-02-22 DIAGNOSIS — J42 Unspecified chronic bronchitis: Secondary | ICD-10-CM | POA: Diagnosis not present

## 2020-02-22 DIAGNOSIS — I509 Heart failure, unspecified: Secondary | ICD-10-CM | POA: Diagnosis not present

## 2020-02-22 DIAGNOSIS — N183 Chronic kidney disease, stage 3 unspecified: Secondary | ICD-10-CM | POA: Diagnosis not present

## 2020-02-22 DIAGNOSIS — I272 Pulmonary hypertension, unspecified: Secondary | ICD-10-CM | POA: Diagnosis not present

## 2020-02-28 DIAGNOSIS — I272 Pulmonary hypertension, unspecified: Secondary | ICD-10-CM | POA: Diagnosis not present

## 2020-02-28 DIAGNOSIS — N183 Chronic kidney disease, stage 3 unspecified: Secondary | ICD-10-CM | POA: Diagnosis not present

## 2020-02-28 DIAGNOSIS — I509 Heart failure, unspecified: Secondary | ICD-10-CM | POA: Diagnosis not present

## 2020-02-28 DIAGNOSIS — J42 Unspecified chronic bronchitis: Secondary | ICD-10-CM | POA: Diagnosis not present

## 2020-02-28 DIAGNOSIS — I4891 Unspecified atrial fibrillation: Secondary | ICD-10-CM | POA: Diagnosis not present

## 2020-02-28 DIAGNOSIS — I130 Hypertensive heart and chronic kidney disease with heart failure and stage 1 through stage 4 chronic kidney disease, or unspecified chronic kidney disease: Secondary | ICD-10-CM | POA: Diagnosis not present

## 2020-03-02 DIAGNOSIS — N183 Chronic kidney disease, stage 3 unspecified: Secondary | ICD-10-CM | POA: Diagnosis not present

## 2020-03-02 DIAGNOSIS — I509 Heart failure, unspecified: Secondary | ICD-10-CM | POA: Diagnosis not present

## 2020-03-02 DIAGNOSIS — I130 Hypertensive heart and chronic kidney disease with heart failure and stage 1 through stage 4 chronic kidney disease, or unspecified chronic kidney disease: Secondary | ICD-10-CM | POA: Diagnosis not present

## 2020-03-02 DIAGNOSIS — I272 Pulmonary hypertension, unspecified: Secondary | ICD-10-CM | POA: Diagnosis not present

## 2020-03-02 DIAGNOSIS — J42 Unspecified chronic bronchitis: Secondary | ICD-10-CM | POA: Diagnosis not present

## 2020-03-02 DIAGNOSIS — I4891 Unspecified atrial fibrillation: Secondary | ICD-10-CM | POA: Diagnosis not present

## 2020-03-04 DIAGNOSIS — I071 Rheumatic tricuspid insufficiency: Secondary | ICD-10-CM | POA: Diagnosis not present

## 2020-03-04 DIAGNOSIS — J302 Other seasonal allergic rhinitis: Secondary | ICD-10-CM | POA: Diagnosis not present

## 2020-03-04 DIAGNOSIS — N183 Chronic kidney disease, stage 3 unspecified: Secondary | ICD-10-CM | POA: Diagnosis not present

## 2020-03-04 DIAGNOSIS — I130 Hypertensive heart and chronic kidney disease with heart failure and stage 1 through stage 4 chronic kidney disease, or unspecified chronic kidney disease: Secondary | ICD-10-CM | POA: Diagnosis not present

## 2020-03-04 DIAGNOSIS — J42 Unspecified chronic bronchitis: Secondary | ICD-10-CM | POA: Diagnosis not present

## 2020-03-04 DIAGNOSIS — I509 Heart failure, unspecified: Secondary | ICD-10-CM | POA: Diagnosis not present

## 2020-03-04 DIAGNOSIS — Z954 Presence of other heart-valve replacement: Secondary | ICD-10-CM | POA: Diagnosis not present

## 2020-03-04 DIAGNOSIS — M069 Rheumatoid arthritis, unspecified: Secondary | ICD-10-CM | POA: Diagnosis not present

## 2020-03-04 DIAGNOSIS — Z681 Body mass index (BMI) 19 or less, adult: Secondary | ICD-10-CM | POA: Diagnosis not present

## 2020-03-04 DIAGNOSIS — Z950 Presence of cardiac pacemaker: Secondary | ICD-10-CM | POA: Diagnosis not present

## 2020-03-04 DIAGNOSIS — I272 Pulmonary hypertension, unspecified: Secondary | ICD-10-CM | POA: Diagnosis not present

## 2020-03-04 DIAGNOSIS — R627 Adult failure to thrive: Secondary | ICD-10-CM | POA: Diagnosis not present

## 2020-03-04 DIAGNOSIS — I447 Left bundle-branch block, unspecified: Secondary | ICD-10-CM | POA: Diagnosis not present

## 2020-03-04 DIAGNOSIS — I4891 Unspecified atrial fibrillation: Secondary | ICD-10-CM | POA: Diagnosis not present

## 2020-03-04 DIAGNOSIS — D631 Anemia in chronic kidney disease: Secondary | ICD-10-CM | POA: Diagnosis not present

## 2020-03-05 DIAGNOSIS — I509 Heart failure, unspecified: Secondary | ICD-10-CM | POA: Diagnosis not present

## 2020-03-05 DIAGNOSIS — I272 Pulmonary hypertension, unspecified: Secondary | ICD-10-CM | POA: Diagnosis not present

## 2020-03-05 DIAGNOSIS — I130 Hypertensive heart and chronic kidney disease with heart failure and stage 1 through stage 4 chronic kidney disease, or unspecified chronic kidney disease: Secondary | ICD-10-CM | POA: Diagnosis not present

## 2020-03-05 DIAGNOSIS — J42 Unspecified chronic bronchitis: Secondary | ICD-10-CM | POA: Diagnosis not present

## 2020-03-05 DIAGNOSIS — I4891 Unspecified atrial fibrillation: Secondary | ICD-10-CM | POA: Diagnosis not present

## 2020-03-05 DIAGNOSIS — N183 Chronic kidney disease, stage 3 unspecified: Secondary | ICD-10-CM | POA: Diagnosis not present

## 2020-03-06 ENCOUNTER — Encounter (HOSPITAL_COMMUNITY): Payer: Self-pay | Admitting: *Deleted

## 2020-03-06 ENCOUNTER — Other Ambulatory Visit: Payer: Self-pay

## 2020-03-06 ENCOUNTER — Emergency Department (HOSPITAL_COMMUNITY)

## 2020-03-06 ENCOUNTER — Inpatient Hospital Stay (HOSPITAL_COMMUNITY)
Admission: EM | Admit: 2020-03-06 | Discharge: 2020-03-09 | DRG: 871 | Disposition: A | Discharge status: Hospice | Source: Skilled Nursing Facility | Attending: Family Medicine | Admitting: Family Medicine

## 2020-03-06 DIAGNOSIS — Z515 Encounter for palliative care: Secondary | ICD-10-CM | POA: Diagnosis present

## 2020-03-06 DIAGNOSIS — I517 Cardiomegaly: Secondary | ICD-10-CM | POA: Diagnosis not present

## 2020-03-06 DIAGNOSIS — B029 Zoster without complications: Secondary | ICD-10-CM | POA: Diagnosis present

## 2020-03-06 DIAGNOSIS — R279 Unspecified lack of coordination: Secondary | ICD-10-CM | POA: Diagnosis not present

## 2020-03-06 DIAGNOSIS — N183 Chronic kidney disease, stage 3 unspecified: Secondary | ICD-10-CM | POA: Diagnosis not present

## 2020-03-06 DIAGNOSIS — Z9981 Dependence on supplemental oxygen: Secondary | ICD-10-CM | POA: Diagnosis not present

## 2020-03-06 DIAGNOSIS — I272 Pulmonary hypertension, unspecified: Secondary | ICD-10-CM | POA: Diagnosis not present

## 2020-03-06 DIAGNOSIS — I130 Hypertensive heart and chronic kidney disease with heart failure and stage 1 through stage 4 chronic kidney disease, or unspecified chronic kidney disease: Secondary | ICD-10-CM | POA: Diagnosis present

## 2020-03-06 DIAGNOSIS — Z79899 Other long term (current) drug therapy: Secondary | ICD-10-CM | POA: Diagnosis not present

## 2020-03-06 DIAGNOSIS — Z743 Need for continuous supervision: Secondary | ICD-10-CM | POA: Diagnosis not present

## 2020-03-06 DIAGNOSIS — R609 Edema, unspecified: Secondary | ICD-10-CM | POA: Diagnosis not present

## 2020-03-06 DIAGNOSIS — I5033 Acute on chronic diastolic (congestive) heart failure: Secondary | ICD-10-CM | POA: Diagnosis present

## 2020-03-06 DIAGNOSIS — A4189 Other specified sepsis: Principal | ICD-10-CM | POA: Diagnosis present

## 2020-03-06 DIAGNOSIS — J90 Pleural effusion, not elsewhere classified: Secondary | ICD-10-CM | POA: Diagnosis not present

## 2020-03-06 DIAGNOSIS — L03116 Cellulitis of left lower limb: Secondary | ICD-10-CM | POA: Diagnosis not present

## 2020-03-06 DIAGNOSIS — U071 COVID-19: Secondary | ICD-10-CM | POA: Diagnosis present

## 2020-03-06 DIAGNOSIS — M7989 Other specified soft tissue disorders: Secondary | ICD-10-CM | POA: Diagnosis not present

## 2020-03-06 DIAGNOSIS — Z90710 Acquired absence of both cervix and uterus: Secondary | ICD-10-CM

## 2020-03-06 DIAGNOSIS — I708 Atherosclerosis of other arteries: Secondary | ICD-10-CM | POA: Diagnosis not present

## 2020-03-06 DIAGNOSIS — S91302A Unspecified open wound, left foot, initial encounter: Secondary | ICD-10-CM | POA: Diagnosis not present

## 2020-03-06 DIAGNOSIS — Z7901 Long term (current) use of anticoagulants: Secondary | ICD-10-CM | POA: Diagnosis not present

## 2020-03-06 DIAGNOSIS — L0390 Cellulitis, unspecified: Secondary | ICD-10-CM | POA: Diagnosis present

## 2020-03-06 DIAGNOSIS — D649 Anemia, unspecified: Secondary | ICD-10-CM | POA: Diagnosis not present

## 2020-03-06 DIAGNOSIS — Z66 Do not resuscitate: Secondary | ICD-10-CM | POA: Diagnosis present

## 2020-03-06 DIAGNOSIS — I259 Chronic ischemic heart disease, unspecified: Secondary | ICD-10-CM | POA: Diagnosis present

## 2020-03-06 DIAGNOSIS — I5032 Chronic diastolic (congestive) heart failure: Secondary | ICD-10-CM | POA: Diagnosis not present

## 2020-03-06 DIAGNOSIS — I739 Peripheral vascular disease, unspecified: Secondary | ICD-10-CM | POA: Diagnosis not present

## 2020-03-06 DIAGNOSIS — R6520 Severe sepsis without septic shock: Secondary | ICD-10-CM | POA: Diagnosis present

## 2020-03-06 DIAGNOSIS — R627 Adult failure to thrive: Secondary | ICD-10-CM | POA: Diagnosis present

## 2020-03-06 DIAGNOSIS — G9341 Metabolic encephalopathy: Secondary | ICD-10-CM | POA: Diagnosis present

## 2020-03-06 DIAGNOSIS — Z7952 Long term (current) use of systemic steroids: Secondary | ICD-10-CM

## 2020-03-06 DIAGNOSIS — I5084 End stage heart failure: Secondary | ICD-10-CM | POA: Diagnosis present

## 2020-03-06 DIAGNOSIS — N1832 Chronic kidney disease, stage 3b: Secondary | ICD-10-CM | POA: Diagnosis not present

## 2020-03-06 DIAGNOSIS — M069 Rheumatoid arthritis, unspecified: Secondary | ICD-10-CM | POA: Diagnosis present

## 2020-03-06 DIAGNOSIS — S81802A Unspecified open wound, left lower leg, initial encounter: Secondary | ICD-10-CM | POA: Diagnosis not present

## 2020-03-06 DIAGNOSIS — M19072 Primary osteoarthritis, left ankle and foot: Secondary | ICD-10-CM | POA: Diagnosis not present

## 2020-03-06 DIAGNOSIS — D696 Thrombocytopenia, unspecified: Secondary | ICD-10-CM | POA: Diagnosis present

## 2020-03-06 DIAGNOSIS — J811 Chronic pulmonary edema: Secondary | ICD-10-CM | POA: Diagnosis not present

## 2020-03-06 DIAGNOSIS — I509 Heart failure, unspecified: Secondary | ICD-10-CM | POA: Diagnosis not present

## 2020-03-06 DIAGNOSIS — J42 Unspecified chronic bronchitis: Secondary | ICD-10-CM | POA: Diagnosis not present

## 2020-03-06 DIAGNOSIS — D631 Anemia in chronic kidney disease: Secondary | ICD-10-CM | POA: Diagnosis present

## 2020-03-06 DIAGNOSIS — J9811 Atelectasis: Secondary | ICD-10-CM | POA: Diagnosis not present

## 2020-03-06 DIAGNOSIS — A419 Sepsis, unspecified organism: Secondary | ICD-10-CM | POA: Diagnosis not present

## 2020-03-06 DIAGNOSIS — Z953 Presence of xenogenic heart valve: Secondary | ICD-10-CM

## 2020-03-06 DIAGNOSIS — I959 Hypotension, unspecified: Secondary | ICD-10-CM | POA: Diagnosis not present

## 2020-03-06 DIAGNOSIS — I480 Paroxysmal atrial fibrillation: Secondary | ICD-10-CM | POA: Diagnosis present

## 2020-03-06 DIAGNOSIS — J1282 Pneumonia due to coronavirus disease 2019: Secondary | ICD-10-CM | POA: Diagnosis not present

## 2020-03-06 DIAGNOSIS — I4891 Unspecified atrial fibrillation: Secondary | ICD-10-CM | POA: Diagnosis not present

## 2020-03-06 DIAGNOSIS — R52 Pain, unspecified: Secondary | ICD-10-CM | POA: Diagnosis not present

## 2020-03-06 DIAGNOSIS — J449 Chronic obstructive pulmonary disease, unspecified: Secondary | ICD-10-CM | POA: Diagnosis present

## 2020-03-06 DIAGNOSIS — N179 Acute kidney failure, unspecified: Secondary | ICD-10-CM | POA: Diagnosis not present

## 2020-03-06 LAB — CBC WITH DIFFERENTIAL/PLATELET
Abs Immature Granulocytes: 0.23 10*3/uL — ABNORMAL HIGH (ref 0.00–0.07)
Basophils Absolute: 0 10*3/uL (ref 0.0–0.1)
Basophils Relative: 0 %
Eosinophils Absolute: 0 10*3/uL (ref 0.0–0.5)
Eosinophils Relative: 0 %
HCT: 28.6 % — ABNORMAL LOW (ref 36.0–46.0)
Hemoglobin: 8.9 g/dL — ABNORMAL LOW (ref 12.0–15.0)
Immature Granulocytes: 3 %
Lymphocytes Relative: 10 %
Lymphs Abs: 0.9 10*3/uL (ref 0.7–4.0)
MCH: 28 pg (ref 26.0–34.0)
MCHC: 31.1 g/dL (ref 30.0–36.0)
MCV: 89.9 fL (ref 80.0–100.0)
Monocytes Absolute: 2.1 10*3/uL — ABNORMAL HIGH (ref 0.1–1.0)
Monocytes Relative: 24 %
Neutro Abs: 5.7 10*3/uL (ref 1.7–7.7)
Neutrophils Relative %: 63 %
Platelets: 111 10*3/uL — ABNORMAL LOW (ref 150–400)
RBC: 3.18 MIL/uL — ABNORMAL LOW (ref 3.87–5.11)
RDW: 15.8 % — ABNORMAL HIGH (ref 11.5–15.5)
WBC: 8.9 10*3/uL (ref 4.0–10.5)
nRBC: 0 % (ref 0.0–0.2)

## 2020-03-06 LAB — COMPREHENSIVE METABOLIC PANEL
ALT: 13 U/L (ref 0–44)
AST: 35 U/L (ref 15–41)
Albumin: 2.9 g/dL — ABNORMAL LOW (ref 3.5–5.0)
Alkaline Phosphatase: 76 U/L (ref 38–126)
Anion gap: 11 (ref 5–15)
BUN: 21 mg/dL (ref 8–23)
CO2: 25 mmol/L (ref 22–32)
Calcium: 7.7 mg/dL — ABNORMAL LOW (ref 8.9–10.3)
Chloride: 94 mmol/L — ABNORMAL LOW (ref 98–111)
Creatinine, Ser: 1.41 mg/dL — ABNORMAL HIGH (ref 0.44–1.00)
GFR calc Af Amer: 38 mL/min — ABNORMAL LOW (ref >60)
GFR calc non Af Amer: 32 mL/min — ABNORMAL LOW (ref >60)
Glucose, Bld: 134 mg/dL — ABNORMAL HIGH (ref 70–99)
Potassium: 4.3 mmol/L (ref 3.5–5.1)
Sodium: 130 mmol/L — ABNORMAL LOW (ref 135–145)
Total Bilirubin: 1.7 mg/dL — ABNORMAL HIGH (ref 0.3–1.2)
Total Protein: 6.1 g/dL — ABNORMAL LOW (ref 6.5–8.1)

## 2020-03-06 LAB — URINALYSIS, ROUTINE W REFLEX MICROSCOPIC
Bilirubin Urine: NEGATIVE
Glucose, UA: NEGATIVE mg/dL
Ketones, ur: NEGATIVE mg/dL
Nitrite: NEGATIVE
Protein, ur: 100 mg/dL — AB
RBC / HPF: >50 RBC/hpf — ABNORMAL HIGH (ref 0–5)
Specific Gravity, Urine: 1.018 (ref 1.005–1.030)
pH: 5 (ref 5.0–8.0)

## 2020-03-06 LAB — APTT: aPTT: 34 seconds (ref 24–36)

## 2020-03-06 LAB — LACTIC ACID, PLASMA
Lactic Acid, Venous: 2.6 mmol/L (ref 0.5–1.9)
Lactic Acid, Venous: 3.8 mmol/L (ref 0.5–1.9)
Lactic Acid, Venous: 2.3 mmol/L (ref 0.5–1.9)

## 2020-03-06 LAB — SARS CORONAVIRUS 2 BY RT PCR (HOSPITAL ORDER, PERFORMED IN ~~LOC~~ HOSPITAL LAB): SARS Coronavirus 2: POSITIVE — AB

## 2020-03-06 LAB — PROTIME-INR
INR: 1.4 — ABNORMAL HIGH (ref 0.8–1.2)
Prothrombin Time: 16.8 seconds — ABNORMAL HIGH (ref 11.4–15.2)

## 2020-03-06 MED ORDER — ALBUMIN HUMAN 25 % IV SOLN
25.0000 g | Freq: Four times a day (QID) | INTRAVENOUS | Status: DC
  Administered 2020-03-06 – 2020-03-07 (×3): 25 g via INTRAVENOUS
  Filled 2020-03-06 (×4): qty 100

## 2020-03-06 MED ORDER — VANCOMYCIN HCL IN DEXTROSE 1-5 GM/200ML-% IV SOLN
1000.0000 mg | Freq: Once | INTRAVENOUS | Status: AC
  Administered 2020-03-06: 1000 mg via INTRAVENOUS
  Filled 2020-03-06: qty 200

## 2020-03-06 MED ORDER — ACETAMINOPHEN 650 MG RE SUPP: 650.0000 mg | Freq: Four times a day (QID) | RECTAL | Status: DC | PRN

## 2020-03-06 MED ORDER — MORPHINE SULFATE (PF) 2 MG/ML IV SOLN: 2.0000 mg | INTRAVENOUS | Status: DC | PRN

## 2020-03-06 MED ORDER — HEPARIN SODIUM (PORCINE) 5000 UNIT/ML IJ SOLN
5000.0000 [IU] | Freq: Three times a day (TID) | INTRAMUSCULAR | Status: DC
  Administered 2020-03-06 – 2020-03-09 (×8): 5000 [IU] via SUBCUTANEOUS
  Filled 2020-03-06 (×8): qty 1

## 2020-03-06 MED ORDER — SODIUM CHLORIDE 0.9 % IV SOLN
2.0000 g | Freq: Once | INTRAVENOUS | Status: AC
  Administered 2020-03-06: 2 g via INTRAVENOUS
  Filled 2020-03-06: qty 20

## 2020-03-06 MED ORDER — SODIUM CHLORIDE 0.9 % IV BOLUS
500.0000 mL | Freq: Once | INTRAVENOUS | Status: AC
  Administered 2020-03-06: 500 mL via INTRAVENOUS

## 2020-03-06 MED ORDER — LACTATED RINGERS IV SOLN: INTRAVENOUS | Status: DC

## 2020-03-06 MED ORDER — SODIUM CHLORIDE 0.9% FLUSH
3.0000 mL | Freq: Two times a day (BID) | INTRAVENOUS | Status: DC
  Administered 2020-03-06 – 2020-03-08 (×4): 3 mL via INTRAVENOUS

## 2020-03-06 MED ORDER — LACTATED RINGERS IV BOLUS (SEPSIS)
500.0000 mL | Freq: Once | INTRAVENOUS | Status: AC
  Administered 2020-03-06: 500 mL via INTRAVENOUS

## 2020-03-06 MED ORDER — SODIUM CHLORIDE 0.9 % IV SOLN
1.0000 g | INTRAVENOUS | Status: DC
  Administered 2020-03-06 – 2020-03-08 (×3): 1 g via INTRAVENOUS
  Filled 2020-03-06 (×2): qty 1
  Filled 2020-03-06: qty 10

## 2020-03-06 MED ORDER — ACETAMINOPHEN 325 MG PO TABS
650.0000 mg | ORAL_TABLET | Freq: Four times a day (QID) | ORAL | Status: DC | PRN
  Administered 2020-03-08: 650 mg via ORAL
  Filled 2020-03-06: qty 2

## 2020-03-06 NOTE — Assessment & Plan Note (Signed)
-   unclear if clinically significant at this time; borderline tachypnea (see sepsis), but no hypoxia; need to also clarify vaccination status tomorrow as well  - continue precautions - if becomes clinically significant and/or hypoxic will consider steroids but unsure how much escalation of care would be warranted until considered futile medical care and will need to be discussed with POA

## 2020-03-06 NOTE — ED Triage Notes (Signed)
Pt is living at [facility] and is a hospice patient due to her CHF.  Pt has developed lower extremity edema and wheeping wounds.  Pt wounds have become more in the past few days and she was sent here for evaluation. Pt was previously in a IT trainer but yesterday an order was written to leave her legs unwrapped and she was also started on acyclovir and doxycycline yesterday and has had one dose of each per MAR.

## 2020-03-06 NOTE — Hospital Course (Addendum)
Nancy Bush is a 86 yo CF with PMH PAF (s/p Maze), MVR with bioprosthesis, RA, pulm HTN, COPD, CHF who has been at home with hospice care and presents to the hospital due to worsening pain in her left leg. She is also extremely lethargic in ER and confused at times; she is unable to fully provide much collateral information. Bulk of HPI obtained from chart review.  In the ER her left leg was noted to be erythematous, painful, warm, and mildly swollen concerning for cellulitis. She has chronic wounds on bilateral LE as well.  Lab workup notable for: Lactic 2.6>>3.8 Na 130, creat 1.41, BUN 21, Alb 2.9, TB 1.7 WBC 8.9 (baseline 2-3), Hgb 8.9 (baseline 9-10), PLTC 111 (chronically low) Covid-19 Positive  She received a dose of vanc and rocephin in the ER and was admitted for continued treatment. She had been started on doxy and acyclovir outpatient however per [facility] review had only received 1 dose of each prior to ER.

## 2020-03-06 NOTE — H&P (Addendum)
History and Physical    Nancy Bush  VOZ:[id number]  DOB: 09-19-1925  DOA: 03/06/2020  PCP: Lawerance Cruel, MD Patient coming from: SNF  Chief Complaint: left leg pain  HPI:  Nancy Bush is a 86 yo CF with PMH PAF (s/p Maze), MVR with bioprosthesis, RA, pulm HTN, COPD, CHF who has been at home with hospice care and presents to the hospital due to worsening pain in her left leg. She is also extremely lethargic in ER and confused at times; she is unable to fully provide much collateral information. Bulk of HPI obtained from chart review.  In the ER her left leg was noted to be erythematous, painful, warm, and mildly swollen concerning for cellulitis. She has chronic wounds on bilateral LE as well.  Lab workup notable for: Lactic 2.6>>3.8 Na 130, creat 1.41, BUN 21, Alb 2.9, TB 1.7 WBC 8.9 (baseline 2-3), Hgb 8.9 (baseline 9-10), PLTC 111 (chronically low) Covid-19 Positive  She received a dose of vanc and rocephin in the ER and was admitted for continued treatment. She had been started on doxy and acyclovir outpatient however per [facility] review had only received 1 dose of each prior to ER.     I have personally briefly reviewed patient's old medical records in [facility] and discussed patient with the ER provider when appropriate/indicated.  Assessment/Plan: Cellulitis - L>R legs are weeping serous fluid; no obvious pus; legs are edematous and left leg is consistent with a cellulitis at this time (erythematous, calor, dolor, edema) - continue on Rocephin and monitor clinical response - wound care consult requested; patient on home hospice and suspect very aggressive treatment (surgical debridement would be futile and overtreatment) - follow up blood cultures   Severe sepsis (HCC) - SIRS criteria considered RR 20 and leukocytosis that is relative to patient's typical baseline (WBC baseline 2-3k, now is 8.9), and she is also borderline hypothermic in ER further suggesting  underlying sepsis; patient also on BB which may be falsely lowering HR in setting of SIRS/Sepsis - source considered LLE cellulitis. Possibly also covid however patient not hypoxic nor in respiratory distress (need to clarify vaccination status as well). She is toxic appearing on bedside evaluation, consistent with sepsis.  - organ dysfunction: lactate >2, MAP<65 (BP 98/45 in ER), acute metabolic encephalopathy - continue on abx and will modify as needed - given on hospice with home and overall poor quality of life, would be hesitant to escalate to pressors if needed, but will need to discuss with POAs if this becomes needed  COVID-19 virus infection - unclear if clinically significant at this time; borderline tachypnea (see sepsis), but no hypoxia; need to also clarify vaccination status tomorrow as well  - continue precautions - if becomes clinically significant and/or hypoxic will consider steroids but unsure how much escalation of care would be warranted until considered futile medical care and will need to be discussed with POA  A-fib ([place]) - MAR reviewed, will await med rec - eliquis and toprol noted - holding in setting of sepsis for now - HSQ for now   AKI (acute kidney injury) ([place]) - differential includes prerenal vs cardiorenal (severe pulm HTN and chronic LE edema)  - did not receive full IVF in ER due to hx CHF - given uptrending lactic will give gentle IVF+albmin and monitor response; if creatinine worsens will trial aggressive diuresis if BP can tolerate; overall this speaks to poor prognosis    Normocytic anemia - currently at baseline; monitor  CBC   Code Status: DNR DVT Prophylaxis:unfractionated SQ heparin 5000 units 2 hours prior to surgery then every 12 hours Anticipated disposition is to SNF  History: Past Medical History:  Diagnosis Date  . Asthma   . Atrial fibrillation ([place])   . CHF (congestive heart failure) ([place])   . COPD (chronic obstructive pulmonary  disease) ([place])   . H/O: hysterectomy   . Hypertension   . LBBB (left bundle branch block)    per discharge note 2003  . Mitral regurgitation    MILD  . MVP (mitral valve prolapse)   . Pacemaker   . Pulmonary hypertension ([place])    MILD TO MODERATE BY ECHO  . PVC (premature ventricular contraction)   . Rheumatoid arthritis(714.0)   . S/P Maze operation for atrial fibrillation 05/18/2012   Complete biatrial lesion set using cryothermy with oversewing of LA appendage  . S/P mitral valve replacement with bioprosthetic valve 05/18/2012   51mm Edwards Magna Mitral pericardial bioprosthesis     Past Surgical History:  Procedure Laterality Date  . ABDOMINAL HYSTERECTOMY    . APPENDECTOMY    . CATARACT EXTRACTION    . CHOLECYSTECTOMY    . hysterectomy    . MAZE  05/18/2012   Procedure: MAZE;  Surgeon: Rexene Alberts, MD;  Location: [place];  Service: Open Heart Surgery;  Laterality: Right;  . MITRAL VALVE REPLACEMENT  05/18/2012   Procedure: MINIMALLY INVASIVE MITRAL VALVE (MV) REPLACEMENT;  Surgeon: Rexene Alberts, MD;  Location: [facility];  Service: Open Heart Surgery;  Laterality: N/A;  . OVARIAN CYST REMOVAL    . PACEMAKER INSERTION    . PERMANENT PACEMAKER INSERTION N/A 05/21/2012   Procedure: PERMANENT PACEMAKER INSERTION;  Surgeon: Evans Lance, MD;  Location: [facility] CATH LAB;  Service: Cardiovascular;  Laterality: N/A;  . TEE WITHOUT CARDIOVERSION  02/09/2012   Procedure: TRANSESOPHAGEAL ECHOCARDIOGRAM (TEE);  Surgeon: Fay Records, MD;  Location: [facility] ENDOSCOPY;  Service: Cardiovascular;  Laterality: N/A;     reports that she has never smoked. She has never used smokeless tobacco. She reports that she does not drink alcohol and does not use drugs.  Allergies  Allergen Reactions  . Promethazine Hcl Other (See Comments)    unknown  . Septra [Sulfamethoxazole-Trimethoprim]   . Sulfonamide Derivatives Other (See Comments)    unknown  . Tequin Other (See Comments)    hallucinations      Family history: Patient unable to provide family history due to cognitive impairment   Home Medications: Prior to Admission medications   Medication Sig Start Date End Date Taking? Authorizing Provider  albuterol (VENTOLIN HFA) 108 (90 Base) MCG/ACT inhaler Inhale 2 puffs into the lungs every 6 (six) hours as needed for wheezing or shortness of breath.  10/11/19   [provider]  apixaban (ELIQUIS) 2.5 MG TABS tablet Take 1 tablet (2.5 mg total) by mouth 2 (two) times daily. 08/30/19   Minus Breeding, MD  furosemide (LASIX) 20 MG tablet Take 2 tablets (40 mg total) by mouth daily. 11/04/19   Deno Etienne, DO  furosemide (LASIX) 20 MG tablet Take 20 mg by mouth every evening.    [provider]  metoprolol succinate (TOPROL-XL) 25 MG 24 hr tablet Take 12.5 mg by mouth daily.    [provider]  montelukast (SINGULAIR) 10 MG tablet Take 10 mg by mouth daily.  12/31/12   [provider]  Multiple Vitamins-Minerals (PRESERVISION AREDS PO) Take 1 tablet by mouth 2 (two) times  daily. Preservision Areds     [provider]  nitroGLYCERIN (NITROSTAT) 0.4 MG SL tablet Place 0.4 mg under the tongue every 5 (five) minutes as needed for chest pain.     [provider]  polyethylene glycol (MIRALAX / GLYCOLAX) 17 g packet Take 34 g by mouth daily. With 8 oz of liquid    [provider]  predniSONE (DELTASONE) 5 MG tablet Take 2.5 mg by mouth every other day.    [provider]    Review of Systems:  Review of systems not obtained due to patient factors. Cognitive impairment   Physical Exam: Vitals:   03/06/20 1630 03/06/20 1700 03/06/20 1830 03/06/20 1900  BP: (!) 113/98 (!) 106/42 (!) 106/42 (!) 98/45  Pulse: 61 60 60 (!) 58  Resp: 17 19 19 19   Temp:   (!) 97.5 F (36.4 C)   TempSrc:   Oral   SpO2: 95% 95% 95% 92%  Weight:       General appearance: grossly lethargic elderly woman chronically ill appearing laying in bed  uncomfortable but no obvious distress Head: Normocephalic, without obvious abnormality, atraumatic Eyes: EOMI Lungs: clear to auscultation bilaterally Heart: regular rate and rhythm and S1, S2 normal Abdomen: soft, NT, ND, BS present Extremities: LE noted with diffuse skin lesions/excoriations, scabbing; LLE noted with erythema, edema, calor, dolor (see pic below); RLE also edematous with scattered lesions Skin: excoriated skin in LE and serous weeping Neurologic: unable to fully assess due to cognitive impairment but moves all 4 extremities and follows some commands    Labs on Admission:  I have personally reviewed following labs and imaging studies Results for orders placed or performed during the hospital encounter of 03/06/20 (from the past 24 hour(s))  Lactic acid, plasma     Status: Abnormal   Collection Time: 03/06/20 12:03 PM  Result Value Ref Range   Lactic Acid, Venous 2.6 (HH) 0.5 - 1.9 mmol/L  Comprehensive metabolic panel     Status: Abnormal   Collection Time: 03/06/20 12:03 PM  Result Value Ref Range   Sodium 130 (L) 135 - 145 mmol/L   Potassium 4.3 3.5 - 5.1 mmol/L   Chloride 94 (L) 98 - 111 mmol/L   CO2 25 22 - 32 mmol/L   Glucose, Bld 134 (H) 70 - 99 mg/dL   BUN 21 8 - 23 mg/dL   Creatinine, Ser 1.41 (H) 0.44 - 1.00 mg/dL   Calcium 7.7 (L) 8.9 - 10.3 mg/dL   Total Protein 6.1 (L) 6.5 - 8.1 g/dL   Albumin 2.9 (L) 3.5 - 5.0 g/dL   AST 35 15 - 41 U/L   ALT 13 0 - 44 U/L   Alkaline Phosphatase 76 38 - 126 U/L   Total Bilirubin 1.7 (H) 0.3 - 1.2 mg/dL   GFR calc non Af Amer 32 (L) >60 mL/min   GFR calc Af Amer 38 (L) >60 mL/min   Anion gap 11 5 - 15  CBC WITH DIFFERENTIAL     Status: Abnormal   Collection Time: 03/06/20 12:03 PM  Result Value Ref Range   WBC 8.9 4.0 - 10.5 K/uL   RBC 3.18 (L) 3.87 - 5.11 MIL/uL   Hemoglobin 8.9 (L) 12.0 - 15.0 g/dL   HCT 28.6 (L) 36 - 46 %   MCV 89.9 80.0 - 100.0 fL   MCH 28.0 26.0 - 34.0 pg   MCHC 31.1 30.0 - 36.0 g/dL    RDW 15.8 (H) 11.5 - 15.5 %  Platelets 111 (L) 150 - 400 K/uL   nRBC 0.0 0.0 - 0.2 %   Neutrophils Relative % 63 %   Neutro Abs 5.7 1.7 - 7.7 K/uL   Lymphocytes Relative 10 %   Lymphs Abs 0.9 0.7 - 4.0 K/uL   Monocytes Relative 24 %   Monocytes Absolute 2.1 (H) 0 - 1 K/uL   Eosinophils Relative 0 %   Eosinophils Absolute 0.0 0 - 0 K/uL   Basophils Relative 0 %   Basophils Absolute 0.0 0 - 0 K/uL   Immature Granulocytes 3 %   Abs Immature Granulocytes 0.23 (H) 0.00 - 0.07 K/uL  Protime-INR     Status: Abnormal   Collection Time: 03/06/20 12:03 PM  Result Value Ref Range   Prothrombin Time 16.8 (H) 11.4 - 15.2 seconds   INR 1.4 (H) 0.8 - 1.2  APTT     Status: None   Collection Time: 03/06/20 12:03 PM  Result Value Ref Range   aPTT 34 24 - 36 seconds  Lactic acid, plasma     Status: Abnormal   Collection Time: 03/06/20  2:25 PM  Result Value Ref Range   Lactic Acid, Venous 3.8 (HH) 0.5 - 1.9 mmol/L  SARS Coronavirus 2 by RT PCR (hospital order, performed in [facility] hospital lab) Nasopharyngeal Nasopharyngeal Swab     Status: Abnormal   Collection Time: 03/06/20  2:41 PM   Specimen: Nasopharyngeal Swab  Result Value Ref Range   SARS Coronavirus 2 POSITIVE (A) NEGATIVE     Radiological Exams on Admission: DG Tibia/Fibula Left  Result Date: 03/06/2020 CLINICAL DATA:  Lower extremity edema.  Weeping wounds. EXAM: LEFT TIBIA AND FIBULA - 2 VIEW COMPARISON:  No recent. FINDINGS: Diffuse soft tissue swelling. Osteopenia. Remnant of fibrous cortical defect or muscle insertion noted along the posterior distal femur. No significant acute or focal bony abnormality identified. No focal bony erosion. Peripheral vascular calcification. IMPRESSION: 1. Diffuse soft tissue swelling. No acute or focal bony abnormality identified. No evidence of bony erosion. 2.  Peripheral vascular disease. Electronically Signed   By: Marcello Moores  Register   On: 03/06/2020 13:44   DG Chest Port 1 View  Result  Date: 03/06/2020 CLINICAL DATA:  Lower extremity edema and soft tissue wounds. EXAM: PORTABLE CHEST 1 VIEW COMPARISON:  November 04, 2019 FINDINGS: There is a persistent small pleural effusion on each side with atelectatic change in the bases. Lungs elsewhere are clear. There is cardiomegaly with pulmonary vascularity within normal limits. Pacemaker leads are attached to the right atrium and right ventricle. Patient is status post mitral valve replacement. There is aortic atherosclerosis. No adenopathy. No bone lesions. Surgical clips noted over the lateral right chest. IMPRESSION: Small pleural effusion on each side with bibasilar atelectasis. Lungs elsewhere clear. Stable cardiomegaly. Pacemaker leads attached to right atrium and right ventricle. Status post mitral valve replacement. Aortic Atherosclerosis (ICD10-I70.0). Electronically Signed   By: Lowella Grip III M.D.   On: 03/06/2020 13:46   DG Foot Complete Left  Result Date: 03/06/2020 CLINICAL DATA:  Lower extremity edema and soft tissue wounds EXAM: LEFT FOOT - COMPLETE 3+ VIEW COMPARISON:  None. FINDINGS: Frontal, oblique, and lateral views were obtained. No evident fracture or dislocation. There is narrowing of all PIP and DIP joints. Other joint spaces appear unremarkable. No erosive change or bony destruction. There are multiple foci of arterial vascular calcification. IMPRESSION: Osteoarthritic change in multiple distal joints. No fracture or dislocation. No erosive change or bony destruction. No soft  tissue air. Multiple foci of arterial vascular calcification noted. Electronically Signed   By: Lowella Grip III M.D.   On: 03/06/2020 13:39   DG Chest Port 1 View  Final Result    DG Tibia/Fibula Left  Final Result    DG Foot Complete Left  Final Result      Consults called:  n/a  EKG: Independently reviewed. Atrial paced, rate 60   [name], MD [facility] Pager: Secure chat  If 7PM-7AM, please contact  night-coverage [email or web address] Use universal [place] password for that web site. If you do not have the password, please call the hospital operator.  03/06/2020, 7:46 PM

## 2020-03-06 NOTE — Assessment & Plan Note (Addendum)
-   SIRS criteria considered RR 20 and leukocytosis that is relative to patient's typical baseline (WBC baseline 2-3k, now is 8.9), and she is also borderline hypothermic in ER further suggesting underlying sepsis; patient also on BB which may be falsely lowering HR in setting of SIRS/Sepsis - source considered LLE cellulitis. Possibly also covid however patient not hypoxic nor in respiratory distress (need to clarify vaccination status as well). She is toxic appearing on bedside evaluation, consistent with sepsis.  - organ dysfunction: lactate >2, MAP<65 (BP 98/45 in ER), acute metabolic encephalopathy - continue on abx and will modify as needed - given on hospice with home and overall poor quality of life, would be hesitant to escalate to pressors if needed, but will need to discuss with POAs if this becomes needed

## 2020-03-06 NOTE — ED Provider Notes (Signed)
[facility] DEPT Provider Note   CSN: [id number] Arrival date & time: 03/06/20  1119     History Chief Complaint  Patient presents with  . Recurrent Skin Infections    wheeping wounds on BLE    Nancy Bush is a 86 y.o. female.  HPI 86 year old female presents with leg infections.  History is by the nurses spoke to EMS as well as the patient.  She is in hospice and has chronic leg edema.  Recently noted to have worsening redness and placed on doxycycline and acyclovir.  No known fevers.  The patient does have some pain though at this time is declining pain medicine.   Past Medical History:  Diagnosis Date  . Asthma   . Atrial fibrillation ([place])   . CHF (congestive heart failure) ([place])   . COPD (chronic obstructive pulmonary disease) ([place])   . H/O: hysterectomy   . Hypertension   . LBBB (left bundle branch block)    per discharge note 2003  . Mitral regurgitation    MILD  . MVP (mitral valve prolapse)   . Pacemaker   . Pulmonary hypertension ([place])    MILD TO MODERATE BY ECHO  . PVC (premature ventricular contraction)   . Rheumatoid arthritis(714.0)   . S/P Maze operation for atrial fibrillation 05/18/2012   Complete biatrial lesion set using cryothermy with oversewing of LA appendage  . S/P mitral valve replacement with bioprosthetic valve 05/18/2012   64mm Edwards Magna Mitral pericardial bioprosthesis     Patient Active Problem List   Diagnosis Date Noted  . Failure to thrive (0-17) 12/08/2019  . CHF exacerbation ([place]) 10/26/2019  . Educated about COVID-19 virus infection 03/20/2019  . Hypotension 03/18/2019  . S/P MVR (mitral valve repair) 03/18/2019  . Pancytopenia ([place]) 05/21/2018  . TGA (transient global amnesia) 05/19/2018  . Essential hypertension 12/11/2017  . Medication management 12/11/2017  . Pulmonary HTN ([place]) 12/30/2016  . Protein calorie malnutrition ([employer]) 08/22/2012  . CAP (community acquired pneumonia)  08/21/2012  . Hypokalemia 08/21/2012  . SOB (shortness of breath) 08/20/2012  . FTT (failure to thrive) in adult 08/20/2012  . Leucocytosis 08/20/2012  . Atrial flutter ([place]) 06/24/2012  . CKD (chronic kidney disease), stage III 06/24/2012  . Pleural effusion 06/24/2012  . [employer] 05/25/2012  . Bradycardia 05/21/2012  . S/P mitral valve replacement with bioprosthetic valve 05/18/2012  . S/P Maze operation for atrial fibrillation 05/18/2012  . Severe mitral regurgitation 05/17/2012  . A-fib ([place]) 05/17/2012  . MR (mitral regurgitation) 05/03/2012  . Fatigue 01/29/2012  . Acute on chronic diastolic heart failure ([place]) 06/10/2011  . CKD (chronic kidney disease) 06/03/2011  . Encounter for long-term (current) use of anticoagulants 05/26/2011  . Atrial fibrillation ([place]) 05/19/2011  . CHEST PAIN UNSPECIFIED 08/28/2010  . ABNORMAL ELECTROCARDIOGRAM 11/14/2009  . Mitral Regurgitation 11/12/2009  . PULMONARY HYPERTENSION 11/12/2009  . Mitral valve disorder 11/12/2009  . SINUSITIS, CHRONIC 06/12/2007  . ALLERGIC RHINITIS 06/12/2007  . BRONCHITIS 06/12/2007  . COPD 06/12/2007    Past Surgical History:  Procedure Laterality Date  . ABDOMINAL HYSTERECTOMY    . APPENDECTOMY    . CATARACT EXTRACTION    . CHOLECYSTECTOMY    . hysterectomy    . MAZE  05/18/2012   Procedure: MAZE;  Surgeon: Rexene Alberts, MD;  Location: [facility];  Service: Open Heart Surgery;  Laterality: Right;  . MITRAL VALVE REPLACEMENT  05/18/2012   Procedure: MINIMALLY INVASIVE MITRAL VALVE (MV) REPLACEMENT;  Surgeon:  Rexene Alberts, MD;  Location: [facility];  Service: Open Heart Surgery;  Laterality: N/A;  . OVARIAN CYST REMOVAL    . PACEMAKER INSERTION    . PERMANENT PACEMAKER INSERTION N/A 05/21/2012   Procedure: PERMANENT PACEMAKER INSERTION;  Surgeon: Evans Lance, MD;  Location: [facility] CATH LAB;  Service: Cardiovascular;  Laterality: N/A;  . TEE WITHOUT CARDIOVERSION  02/09/2012   Procedure:  TRANSESOPHAGEAL ECHOCARDIOGRAM (TEE);  Surgeon: Fay Records, MD;  Location: [facility] ENDOSCOPY;  Service: Cardiovascular;  Laterality: N/A;     OB History   No obstetric history on file.     No family history on file.  Social History   Tobacco Use  . Smoking status: Never Smoker  . Smokeless tobacco: Never Used  Vaping Use  . Vaping Use: Never used  Substance Use Topics  . Alcohol use: No  . Drug use: No    Home Medications Prior to Admission medications   Medication Sig Start Date End Date Taking? Authorizing Provider  albuterol (VENTOLIN HFA) 108 (90 Base) MCG/ACT inhaler Inhale 2 puffs into the lungs every 6 (six) hours as needed for wheezing or shortness of breath.  10/11/19   [provider]  apixaban (ELIQUIS) 2.5 MG TABS tablet Take 1 tablet (2.5 mg total) by mouth 2 (two) times daily. 08/30/19   Minus Breeding, MD  furosemide (LASIX) 20 MG tablet Take 2 tablets (40 mg total) by mouth daily. 11/04/19   Deno Etienne, DO  furosemide (LASIX) 20 MG tablet Take 20 mg by mouth every evening.    [provider]  metoprolol succinate (TOPROL-XL) 25 MG 24 hr tablet Take 12.5 mg by mouth daily.    [provider]  montelukast (SINGULAIR) 10 MG tablet Take 10 mg by mouth daily.  12/31/12   [provider]  Multiple Vitamins-Minerals (PRESERVISION AREDS PO) Take 1 tablet by mouth 2 (two) times daily. Preservision Areds     [provider]  nitroGLYCERIN (NITROSTAT) 0.4 MG SL tablet Place 0.4 mg under the tongue every 5 (five) minutes as needed for chest pain.     [provider]  polyethylene glycol (MIRALAX / GLYCOLAX) 17 g packet Take 34 g by mouth daily. With 8 oz of liquid    [provider]  predniSONE (DELTASONE) 5 MG tablet Take 2.5 mg by mouth every other day.    [provider]    Allergies    Promethazine hcl, Septra [sulfamethoxazole-trimethoprim], Sulfonamide derivatives, and Tequin  Review of Systems   Review  of Systems  Constitutional: Negative for fever.  Respiratory: Negative for shortness of breath.   Musculoskeletal: Positive for myalgias.  Skin: Positive for color change and wound.  All other systems reviewed and are negative.   Physical Exam Updated Vital Signs BP (!) 118/44 (BP Location: Right Arm)   Pulse 60   Temp (!) 97.5 F (36.4 C) (Oral)   Resp 19   Wt 47.2 kg   SpO2 95%   BMI 21.01 kg/m   Physical Exam Vitals and nursing note reviewed.  Constitutional:      Appearance: She is well-developed.  HENT:     Head: Normocephalic and atraumatic.     Right Ear: External ear normal.     Left Ear: External ear normal.     Nose: Nose normal.  Eyes:     General:        Right eye: No discharge.        Left eye: No discharge.  Cardiovascular:     Rate and Rhythm: Normal rate and regular rhythm.     Pulses:          Dorsalis pedis pulses are 2+ on the right side and 2+ on the left side.     Heart sounds: Normal heart sounds.  Pulmonary:     Effort: Pulmonary effort is normal.     Breath sounds: Normal breath sounds.  Abdominal:     Palpations: Abdomen is soft.     Tenderness: There is no abdominal tenderness.  Musculoskeletal:     Comments: Right lower extremity has some pitting edema from the lower leg down to the foot.  However this is not tender.  There is some clear weeping.  Left lower extremity is diffusely erythematous along with some drainage.  Diffuse tenderness along with edema.  Skin:    General: Skin is warm and dry.  Neurological:     Mental Status: She is alert.  Psychiatric:        Mood and Affect: Mood is not anxious.     ED Results / Procedures / Treatments   Labs (all labs ordered are listed, but only abnormal results are displayed) Labs Reviewed  LACTIC ACID, PLASMA - Abnormal; Notable for the following components:      Result Value   Lactic Acid, Venous 2.6 (*)    All other components within normal limits  COMPREHENSIVE METABOLIC PANEL -  Abnormal; Notable for the following components:   Sodium 130 (*)    Chloride 94 (*)    Glucose, Bld 134 (*)    Creatinine, Ser 1.41 (*)    Calcium 7.7 (*)    Total Protein 6.1 (*)    Albumin 2.9 (*)    Total Bilirubin 1.7 (*)    GFR calc non Af Amer 32 (*)    GFR calc Af Amer 38 (*)    All other components within normal limits  CBC WITH DIFFERENTIAL/PLATELET - Abnormal; Notable for the following components:   RBC 3.18 (*)    Hemoglobin 8.9 (*)    HCT 28.6 (*)    RDW 15.8 (*)    Platelets 111 (*)    Monocytes Absolute 2.1 (*)    Abs Immature Granulocytes 0.23 (*)    All other components within normal limits  PROTIME-INR - Abnormal; Notable for the following components:   Prothrombin Time 16.8 (*)    INR 1.4 (*)    All other components within normal limits  CULTURE, BLOOD (ROUTINE X 2)  CULTURE, BLOOD (ROUTINE X 2)  URINE CULTURE  SARS CORONAVIRUS 2 BY RT PCR (HOSPITAL ORDER, [facility])  APTT  LACTIC ACID, PLASMA  URINALYSIS, ROUTINE W REFLEX MICROSCOPIC    EKG None  Radiology DG Tibia/Fibula Left  Result Date: 03/06/2020 CLINICAL DATA:  Lower extremity edema.  Weeping wounds. EXAM: LEFT TIBIA AND FIBULA - 2 VIEW COMPARISON:  No recent. FINDINGS: Diffuse soft tissue swelling. Osteopenia. Remnant of fibrous cortical defect or muscle insertion noted along the posterior distal femur. No significant acute or focal bony abnormality identified. No focal bony erosion. Peripheral vascular calcification. IMPRESSION: 1. Diffuse soft tissue swelling. No acute or focal bony abnormality identified. No evidence of bony erosion. 2.  Peripheral vascular disease. Electronically Signed   By: Marcello Moores  Register   On: 03/06/2020 13:44   DG Chest Port 1 View  Result Date: 03/06/2020 CLINICAL DATA:  Lower extremity edema and soft tissue wounds. EXAM: PORTABLE CHEST 1 VIEW COMPARISON:  November 04, 2019 FINDINGS: There is a persistent small pleural effusion on each side with  atelectatic change in the bases. Lungs elsewhere are clear. There is cardiomegaly with pulmonary vascularity within normal limits. Pacemaker leads are attached to the right atrium and right ventricle. Patient is status post mitral valve replacement. There is aortic atherosclerosis. No adenopathy. No bone lesions. Surgical clips noted over the lateral right chest. IMPRESSION: Small pleural effusion on each side with bibasilar atelectasis. Lungs elsewhere clear. Stable cardiomegaly. Pacemaker leads attached to right atrium and right ventricle. Status post mitral valve replacement. Aortic Atherosclerosis (ICD10-I70.0). Electronically Signed   By: Lowella Grip III M.D.   On: 03/06/2020 13:46   DG Foot Complete Left  Result Date: 03/06/2020 CLINICAL DATA:  Lower extremity edema and soft tissue wounds EXAM: LEFT FOOT - COMPLETE 3+ VIEW COMPARISON:  None. FINDINGS: Frontal, oblique, and lateral views were obtained. No evident fracture or dislocation. There is narrowing of all PIP and DIP joints. Other joint spaces appear unremarkable. No erosive change or bony destruction. There are multiple foci of arterial vascular calcification. IMPRESSION: Osteoarthritic change in multiple distal joints. No fracture or dislocation. No erosive change or bony destruction. No soft tissue air. Multiple foci of arterial vascular calcification noted. Electronically Signed   By: Lowella Grip III M.D.   On: 03/06/2020 13:39    Procedures .Critical Care Performed by: Sherwood Gambler, MD Authorized by: Sherwood Gambler, MD   Critical care provider statement:    Critical care time (minutes):  30   Critical care time was exclusive of:  Separately billable procedures and treating other patients   Critical care was necessary to treat or prevent imminent or life-threatening deterioration of the following conditions:  Sepsis   Critical care was time spent personally by me on the following activities:  Discussions with  consultants, evaluation of patient's response to treatment, examination of patient, ordering and performing treatments and interventions, ordering and review of laboratory studies, ordering and review of radiographic studies, pulse oximetry, re-evaluation of patient's condition, obtaining history from patient or surrogate and review of old charts   (including critical care time)  Medications Ordered in ED Medications  vancomycin (VANCOCIN) IVPB 1000 mg/200 mL premix (0 mg Intravenous Stopped 03/06/20 1347)  cefTRIAXone (ROCEPHIN) 2 g in sodium chloride 0.9 % 100 mL IVPB (0 g Intravenous Stopped 03/06/20 1305)  lactated ringers bolus 500 mL (0 mLs Intravenous Stopped 03/06/20 1355)    ED Course  I have reviewed the triage vital signs and the nursing notes.  Pertinent labs & imaging results that were available during my care of the patient were reviewed by me and considered in my medical decision making (see chart for details).    MDM Rules/Calculators/A&P                          Patient with leg cellulitis. While she is tender, she is not overtly so. Doubt deep space infection. Given IV antibiotics, fluids. Lactate mildly elevated. Discussed with hospitalist for admission. Final Clinical Impression(s) / ED Diagnoses Final diagnoses:  Left leg cellulitis  Severe sepsis [facility])    Rx / DC Orders ED Discharge Orders    None       Sherwood Gambler, MD 03/06/20 [phone]

## 2020-03-06 NOTE — Progress Notes (Signed)
A consult was received from an ED physician for vancomycin per pharmacy dosing.  The patient's profile has been reviewed for ht/wt/allergies/indication/available labs.    A one time order has been placed for vancomycin 1000 mg x1.  Further antibiotics/pharmacy consults should be ordered by admitting physician if indicated.                       Thank you, Lynelle Doctor 03/06/2020  12:34 PM

## 2020-03-06 NOTE — Assessment & Plan Note (Addendum)
-   differential includes prerenal vs cardiorenal (severe pulm HTN and chronic LE edema)  - did not receive full IVF in ER due to hx CHF - given uptrending lactic will give gentle IVF+albmin and monitor response; if creatinine worsens will trial aggressive diuresis if BP can tolerate; overall this speaks to poor prognosis

## 2020-03-06 NOTE — Assessment & Plan Note (Addendum)
-   MAR reviewed, will await med rec - eliquis and toprol noted - holding in setting of sepsis for now - HSQ for now

## 2020-03-06 NOTE — Progress Notes (Signed)
Pt unable to receive full amount of fluid resuscitation per sepsis protocol due to severe heart failure.

## 2020-03-06 NOTE — Progress Notes (Addendum)
[facility])  Ms. Nancy Bush is our current hospice patient with a terminal diagnosis of Heart Failure. I have spoken to the patients POA which is her niece Nancy Bush about transfer to [facility] by [facility]. She was aware and is in touch with [facility] RN (home nurse) Nancy Bush who was at bedside when facility decided to call EMS for transport. [facility] told family that patient has been declining and would send to ED for further work up. This is a related admission.   [facility] will continue to follow patient during hospitalization. If any hospice related questions please feel free to call.  Clementeen Hoof, [facility] (in [place]) [phone]

## 2020-03-06 NOTE — Assessment & Plan Note (Addendum)
-   L>R legs are weeping serous fluid; no obvious pus; legs are edematous and left leg is consistent with a cellulitis at this time (erythematous, calor, dolor, edema) - continue on Rocephin and monitor clinical response - wound care consult requested; patient on home hospice and suspect very aggressive treatment (surgical debridement would be futile and overtreatment) - follow up blood cultures

## 2020-03-06 NOTE — Assessment & Plan Note (Signed)
-   currently at baseline; monitor CBC

## 2020-03-07 DIAGNOSIS — I480 Paroxysmal atrial fibrillation: Secondary | ICD-10-CM

## 2020-03-07 DIAGNOSIS — D649 Anemia, unspecified: Secondary | ICD-10-CM

## 2020-03-07 LAB — COMPREHENSIVE METABOLIC PANEL
ALT: 18 U/L (ref 0–44)
ALT: 20 U/L (ref 0–44)
AST: 53 U/L — ABNORMAL HIGH (ref 15–41)
AST: 59 U/L — ABNORMAL HIGH (ref 15–41)
Albumin: 3.3 g/dL — ABNORMAL LOW (ref 3.5–5.0)
Albumin: 3.7 g/dL (ref 3.5–5.0)
Alkaline Phosphatase: 59 U/L (ref 38–126)
Alkaline Phosphatase: 56 U/L (ref 38–126)
Anion gap: 12 (ref 5–15)
Anion gap: 10 (ref 5–15)
BUN: 26 mg/dL — ABNORMAL HIGH (ref 8–23)
BUN: 26 mg/dL — ABNORMAL HIGH (ref 8–23)
CO2: 24 mmol/L (ref 22–32)
CO2: 26 mmol/L (ref 22–32)
Calcium: 8.1 mg/dL — ABNORMAL LOW (ref 8.9–10.3)
Calcium: 8.2 mg/dL — ABNORMAL LOW (ref 8.9–10.3)
Chloride: 97 mmol/L — ABNORMAL LOW (ref 98–111)
Chloride: 96 mmol/L — ABNORMAL LOW (ref 98–111)
Creatinine, Ser: 1.57 mg/dL — ABNORMAL HIGH (ref 0.44–1.00)
Creatinine, Ser: 1.69 mg/dL — ABNORMAL HIGH (ref 0.44–1.00)
GFR calc Af Amer: 33 mL/min — ABNORMAL LOW (ref >60)
GFR calc Af Amer: 30 mL/min — ABNORMAL LOW (ref >60)
GFR calc non Af Amer: 29 mL/min — ABNORMAL LOW (ref >60)
GFR calc non Af Amer: 26 mL/min — ABNORMAL LOW (ref >60)
Glucose, Bld: 128 mg/dL — ABNORMAL HIGH (ref 70–99)
Glucose, Bld: 130 mg/dL — ABNORMAL HIGH (ref 70–99)
Potassium: 4.3 mmol/L (ref 3.5–5.1)
Potassium: 4.5 mmol/L (ref 3.5–5.1)
Sodium: 133 mmol/L — ABNORMAL LOW (ref 135–145)
Sodium: 132 mmol/L — ABNORMAL LOW (ref 135–145)
Total Bilirubin: 1.3 mg/dL — ABNORMAL HIGH (ref 0.3–1.2)
Total Bilirubin: 1.2 mg/dL (ref 0.3–1.2)
Total Protein: 5.9 g/dL — ABNORMAL LOW (ref 6.5–8.1)
Total Protein: 6.2 g/dL — ABNORMAL LOW (ref 6.5–8.1)

## 2020-03-07 LAB — CBC WITH DIFFERENTIAL/PLATELET
Abs Immature Granulocytes: 0.11 10*3/uL — ABNORMAL HIGH (ref 0.00–0.07)
Abs Immature Granulocytes: 0.09 10*3/uL — ABNORMAL HIGH (ref 0.00–0.07)
Basophils Absolute: 0 10*3/uL (ref 0.0–0.1)
Basophils Absolute: 0 10*3/uL (ref 0.0–0.1)
Basophils Relative: 0 %
Basophils Relative: 0 %
Eosinophils Absolute: 0 10*3/uL (ref 0.0–0.5)
Eosinophils Absolute: 0 10*3/uL (ref 0.0–0.5)
Eosinophils Relative: 0 %
Eosinophils Relative: 0 %
HCT: 24.7 % — ABNORMAL LOW (ref 36.0–46.0)
HCT: 26.4 % — ABNORMAL LOW (ref 36.0–46.0)
Hemoglobin: 7.8 g/dL — ABNORMAL LOW (ref 12.0–15.0)
Hemoglobin: 8.2 g/dL — ABNORMAL LOW (ref 12.0–15.0)
Immature Granulocytes: 1 %
Immature Granulocytes: 1 %
Lymphocytes Relative: 12 %
Lymphocytes Relative: 12 %
Lymphs Abs: 1 10*3/uL (ref 0.7–4.0)
Lymphs Abs: 0.9 10*3/uL (ref 0.7–4.0)
MCH: 27.8 pg (ref 26.0–34.0)
MCH: 27.9 pg (ref 26.0–34.0)
MCHC: 31.6 g/dL (ref 30.0–36.0)
MCHC: 31.1 g/dL (ref 30.0–36.0)
MCV: 87.9 fL (ref 80.0–100.0)
MCV: 89.8 fL (ref 80.0–100.0)
Monocytes Absolute: 1.6 10*3/uL — ABNORMAL HIGH (ref 0.1–1.0)
Monocytes Absolute: 1.6 10*3/uL — ABNORMAL HIGH (ref 0.1–1.0)
Monocytes Relative: 20 %
Monocytes Relative: 21 %
Neutro Abs: 5.3 10*3/uL (ref 1.7–7.7)
Neutro Abs: 5.1 10*3/uL (ref 1.7–7.7)
Neutrophils Relative %: 67 %
Neutrophils Relative %: 66 %
Platelets: 101 10*3/uL — ABNORMAL LOW (ref 150–400)
Platelets: 98 10*3/uL — ABNORMAL LOW (ref 150–400)
RBC: 2.81 MIL/uL — ABNORMAL LOW (ref 3.87–5.11)
RBC: 2.94 MIL/uL — ABNORMAL LOW (ref 3.87–5.11)
RDW: 16.1 % — ABNORMAL HIGH (ref 11.5–15.5)
RDW: 16.1 % — ABNORMAL HIGH (ref 11.5–15.5)
WBC: 8 10*3/uL (ref 4.0–10.5)
WBC: 7.7 10*3/uL (ref 4.0–10.5)
nRBC: 0 % (ref 0.0–0.2)
nRBC: 0 % (ref 0.0–0.2)

## 2020-03-07 LAB — BRAIN NATRIURETIC PEPTIDE: B Natriuretic Peptide: 2370 pg/mL — ABNORMAL HIGH (ref 0.0–100.0)

## 2020-03-07 LAB — C-REACTIVE PROTEIN: CRP: 10.9 mg/dL — ABNORMAL HIGH (ref <1.0)

## 2020-03-07 LAB — D-DIMER, QUANTITATIVE: D-Dimer, Quant: 5.41 ug/mL-FEU — ABNORMAL HIGH (ref 0.00–0.50)

## 2020-03-07 LAB — TROPONIN I (HIGH SENSITIVITY)
Troponin I (High Sensitivity): 71 ng/L — ABNORMAL HIGH (ref <18)
Troponin I (High Sensitivity): 113 ng/L (ref <18)

## 2020-03-07 LAB — LACTIC ACID, PLASMA
Lactic Acid, Venous: 2.5 mmol/L (ref 0.5–1.9)
Lactic Acid, Venous: 2.2 mmol/L (ref 0.5–1.9)

## 2020-03-07 LAB — FERRITIN: Ferritin: 66 ng/mL (ref 11–307)

## 2020-03-07 LAB — PROCALCITONIN: Procalcitonin: 2.1 ng/mL

## 2020-03-07 LAB — MAGNESIUM: Magnesium: 2 mg/dL (ref 1.7–2.4)

## 2020-03-07 MED ORDER — ZINC SULFATE 220 (50 ZN) MG PO CAPS
220.0000 mg | ORAL_CAPSULE | Freq: Every day | ORAL | Status: DC
  Administered 2020-03-07 – 2020-03-08 (×3): 220 mg via ORAL
  Filled 2020-03-07 (×4): qty 1

## 2020-03-07 MED ORDER — ASCORBIC ACID 500 MG PO TABS
500.0000 mg | ORAL_TABLET | Freq: Every day | ORAL | Status: DC
  Administered 2020-03-07 – 2020-03-08 (×3): 500 mg via ORAL
  Filled 2020-03-07 (×4): qty 1

## 2020-03-07 MED ORDER — SODIUM CHLORIDE 0.9 % IV SOLN
100.0000 mg | INTRAVENOUS | Status: AC
  Administered 2020-03-07 (×2): 100 mg via INTRAVENOUS
  Filled 2020-03-07: qty 20

## 2020-03-07 MED ORDER — GUAIFENESIN-DM 100-10 MG/5ML PO SYRP: 10.0000 mL | ORAL_SOLUTION | ORAL | Status: DC | PRN

## 2020-03-07 MED ORDER — HYDROCOD POLST-CPM POLST ER 10-8 MG/5ML PO SUER
5.0000 mL | Freq: Two times a day (BID) | ORAL | Status: DC | PRN
  Administered 2020-03-07 – 2020-03-09 (×3): 5 mL via ORAL
  Filled 2020-03-07 (×3): qty 5

## 2020-03-07 MED ORDER — ALBUTEROL SULFATE (2.5 MG/3ML) 0.083% IN NEBU: 2.5000 mg | INHALATION_SOLUTION | RESPIRATORY_TRACT | Status: DC | PRN

## 2020-03-07 MED ORDER — SODIUM CHLORIDE 0.9 % IV SOLN: 100.0000 mg | Freq: Every day | INTRAVENOUS | Status: DC

## 2020-03-07 MED ORDER — ALBUTEROL SULFATE HFA 108 (90 BASE) MCG/ACT IN AERS
2.0000 | INHALATION_SPRAY | RESPIRATORY_TRACT | Status: DC | PRN
  Administered 2020-03-07: 2 via RESPIRATORY_TRACT
  Filled 2020-03-07: qty 6.7

## 2020-03-07 MED ORDER — SODIUM CHLORIDE 0.9 % IV SOLN
100.0000 mg | Freq: Every day | INTRAVENOUS | Status: DC
  Administered 2020-03-08 – 2020-03-09 (×2): 100 mg via INTRAVENOUS
  Filled 2020-03-07 (×2): qty 20

## 2020-03-07 MED ORDER — REMDESIVIR 100 MG IV SOLR: 200.0000 mg | Freq: Once | INTRAVENOUS | Status: DC

## 2020-03-07 NOTE — Progress Notes (Signed)
PROGRESS NOTE  Nancy Bush  DGL:[id number] DOB: [birth year] DOA: 03/06/2020 PCP: Lawerance Cruel, MD  Outpatient Specialists: [facility] Brief Narrative: Nancy Bush is a 86 y.o. female with a history of PAF s/p MAZE, bioprosthetic MVR, RA, pulmonary HTN, CHF, and COPD who presented from ALF due to progressive decline and concern for left leg infection. On arrival she was afebrile and stable on room air with Lactic 2.6>>3.8 Na 130, creat 1.41, BUN 21, Alb 2.9, TB 1.7 WBC 8.9 (baseline 2-3), Hgb 8.9 (baseline 9-10), PLTC 111 (chronically low) SARS-CoV-2 positive. Vancomycin, ceftriaxone given as well as IV fluids and she was admitted with ceftriaxone continued, remdesivir started.   Assessment & Plan: Principal Problem:   Severe sepsis ([place]) Active Problems:   A-fib (HCC)   Cellulitis   COVID-19 virus infection   AKI (acute kidney injury) ([place])   Normocytic anemia  Covid-19 infection: SARS-CoV-2 PCR positive on 8/3. No hypoxemia and no typical CXR infiltrates to confirm pneumonia.  - Will continue remdesivir, though without hypoxemia and with active bacterial infection, will hold steroids for now.  - Continue airborne, contact precautions for 21 days from positive testing.  Severe sepsis due to nonpurulent cellulitis of LLE w/lactic acid elevation and hypotension:  - Agree with continuing ceftriaxone alone for now. PCT and CRP elevated primarily due to bacterial infection.  - Unable to bolus fluids w/severe CHF/pulmonary HTN, unable to diurese with hypotension. - Monitor blood cultures and urine culture (pyuria and many bacteria on micro)  Chronic HFpEF, pulmonary HTN, severe TR, MR s/p bioprosthetic MVR: Not a candidate for valvular intervention. - Having to hold diuretic in setting of sepsis. Continuing palliative care discussions.  - If hypoxemia is noted, would repeat CXR to evaluate pleural effusions as she was admitted with this recently, and would have to  stop IVF.  Demand myocardial ischemia: Troponin elevation is mild with no chest pain not consistent with ACS.  - Monitor. Unable to give beta blocker with hypotension and remains borderline bradycardic.  Paroxysmal AFib: s/p MAZE.  - Continue metoprolol. With low hgb and hypotension will avoid anticoagulation; with severely low CrCl, will stop DOAC as well.   AKI on stage IIIb CKD: Worsening despite fluid resuscitation. - Continuing IV fluids - Avoid nephrotoxins, vancomycin discontinued.   Thrombocytopenia, AOCKD: - Monitor  Deconditioning, failure to thrive: Reported by her 66, Niece, of a steady decline since enrolling in Hospice in April 2021. She confirms she would not want resuscitation and would not want escalation of therapy. We will see how her clinical picture evolves over the next 24 hours, though I've shared my suspicion that her multiple organ failures are not likely to improve despite any medical therapies. I've recommended pursuing residential hospice placement.  DVT prophylaxis: Heparin Code Status: DNR, no escalation of care Family Communication: None at bedside; spoke with Niece/HCPOA by phone today for 18 minutes. Disposition Plan:  Status is: Inpatient; will change admission order to progressive care unit.  Remains inpatient appropriate because:Hemodynamically unstable   Dispo: The patient is from: ALF              Anticipated d/c is to: TBD, possibly hospice              Anticipated d/c date is: 3 days              Patient currently is not medically stable to d/c.  Consultants:   Palliative care  Procedures:   None  Antimicrobials:  Vancomycin 8/3  Ceftriaxone 8/3 >>  Remdesivir 8/3 >>   Subjective: Pt slow to respond but answers questions appropriately. Pain improved from prior, now just tender to touch on LLE. No chest pain. Feels short of breath, no cough. RN reports some wheezing.  Objective: Vitals:   03/07/20 1300 03/07/20 1309  03/07/20 1346 03/07/20 1410  BP: (!) 101/47 (!) 101/47 (!) 100/47 (!) 103/49  Pulse: (!) 59 61 60 60  Resp: 17 18 20 18   Temp:      TempSrc:      SpO2: 98% 98% 98% 100%  Weight:        Intake/Output Summary (Last 24 hours) at 03/07/2020 1428 Last data filed at 03/07/2020 1230 Gross per 24 hour  Intake 1111.63 ml  Output --  Net 1111.63 ml   Filed Weights   03/06/20 1144  Weight: 47.2 kg    Gen: Frail elderly female in no distress Pulm: Tachypneic, decreased at bases, upper airway sounds transmitted. CV: Regular borderline bradycardia. No murmur, rub, or gallop. No JVD, 2+ pedal edema. GI: Abdomen soft, non-tender, non-distended, with normoactive bowel sounds. No organomegaly or masses felt. Ext: Warm, no deformities Skin: LLE with tender erythematous eruption on LLE as pictured in H&P. No fluctuance. Both legs diffusely edematous.  Neuro: Alert and oriented, slow speech without dysarthria. Very weak diffusely. Psych: Judgement and insight appear normal. Mood & affect appropriate.   Data Reviewed: I have personally reviewed following labs and imaging studies  CBC: Recent Labs  Lab 03/06/20 1203 03/07/20 0743 03/07/20 1042  WBC 8.9 8.0 7.7  NEUTROABS 5.7 5.3 5.1  HGB 8.9* 7.8* 8.2*  HCT 28.6* 24.7* 26.4*  MCV 89.9 87.9 89.8  PLT 111* 101* 98*   Basic Metabolic Panel: Recent Labs  Lab 03/06/20 1203 03/07/20 0743 03/07/20 1042  NA 130* 133* 132*  K 4.3 4.3 4.5  CL 94* 97* 96*  CO2 25 24 26   GLUCOSE 134* 128* 130*  BUN 21 26* 26*  CREATININE 1.41* 1.57* 1.69*  CALCIUM 7.7* 8.1* 8.2*  MG  --  2.0  --    GFR: Estimated Creatinine Clearance: 14.8 mL/min (A) (by C-G formula based on SCr of 1.69 mg/dL (H)). Liver Function Tests: Recent Labs  Lab 03/06/20 1203 03/07/20 0743 03/07/20 1042  AST 35 53* 59*  ALT 13 18 20   ALKPHOS 76 59 56  BILITOT 1.7* 1.3* 1.2  PROT 6.1* 5.9* 6.2*  ALBUMIN 2.9* 3.3* 3.7   No results for input(s): LIPASE, AMYLASE in the last  168 hours. No results for input(s): AMMONIA in the last 168 hours. Coagulation Profile: Recent Labs  Lab 03/06/20 1203  INR 1.4*   Cardiac Enzymes: No results for input(s): CKTOTAL, CKMB, CKMBINDEX, TROPONINI in the last 168 hours. BNP (last 3 results) No results for input(s): PROBNP in the last 8760 hours. HbA1C: No results for input(s): HGBA1C in the last 72 hours. CBG: No results for input(s): GLUCAP in the last 168 hours. Lipid Profile: No results for input(s): CHOL, HDL, LDLCALC, TRIG, CHOLHDL, LDLDIRECT in the last 72 hours. Thyroid Function Tests: No results for input(s): TSH, T4TOTAL, FREET4, T3FREE, THYROIDAB in the last 72 hours. Anemia Panel: Recent Labs    03/07/20 1042  FERRITIN 66   Urine analysis:    Component Value Date/Time   COLORURINE AMBER (A) 03/06/2020 2130   APPEARANCEUR CLOUDY (A) 03/06/2020 2130   APPEARANCEUR Clear 10/12/2018 1429   LABSPEC 1.018 03/06/2020 2130   PHURINE 5.0 03/06/2020 2130   GLUCOSEU NEGATIVE 03/06/2020  2130   HGBUR LARGE (A) 03/06/2020 2130   BILIRUBINUR NEGATIVE 03/06/2020 2130   BILIRUBINUR Negative 10/12/2018 [place] 03/06/2020 2130   PROTEINUR 100 (A) 03/06/2020 2130   UROBILINOGEN 0.2 08/21/2012 1645   NITRITE NEGATIVE 03/06/2020 2130   LEUKOCYTESUR MODERATE (A) 03/06/2020 2130   Recent Results (from the past 240 hour(s))  Blood Culture (routine x 2)     Status: None (Preliminary result)   Collection Time: 03/06/20 12:25 PM   Specimen: BLOOD RIGHT FOREARM  Result Value Ref Range Status   Specimen Description   Final    BLOOD RIGHT FOREARM Performed at [facility], [place] [address], [place], [place] [address]    Special Requests   Final    BOTTLES DRAWN AEROBIC AND ANAEROBIC Blood Culture results may not be optimal due to an inadequate volume of blood received in culture bottles Performed at [facility] [address], [place], [place] [address]     Culture   Final    NO GROWTH < 24 HOURS Performed at [facility] [address], [place], [place] [address]    Report Status PENDING  Incomplete  Blood Culture (routine x 2)     Status: None (Preliminary result)   Collection Time: 03/06/20 12:34 PM   Specimen: BLOOD LEFT FOREARM  Result Value Ref Range Status   Specimen Description   Final    BLOOD LEFT FOREARM Performed at [facility] [address], [place], [place] [address]    Special Requests   Final    BOTTLES DRAWN AEROBIC AND ANAEROBIC Blood Culture results may not be optimal due to an inadequate volume of blood received in culture bottles Performed at [facility] [address] [place],  [address]    Culture   Final    NO GROWTH < 24 HOURS Performed at [facility] [address] [place],  [address]    Report Status PENDING  Incomplete  SARS Coronavirus 2 by RT PCR (hospital order, performed in [facility] hospital lab) Nasopharyngeal Nasopharyngeal Swab     Status: Abnormal   Collection Time: 03/06/20  2:41 PM   Specimen: Nasopharyngeal Swab  Result Value Ref Range Status   SARS Coronavirus 2 POSITIVE (A) NEGATIVE Final    Comment: RESULT CALLED TO, READ BACK BY AND VERIFIED WITH: C.FRANKLIN AT 0109 ON 03/06/20 BY N.THOMPSON (NOTE) SARS-CoV-2 target nucleic acids are DETECTED  SARS-CoV-2 RNA is generally detectable in upper respiratory specimens  during the acute phase of infection.  Positive results are indicative  of the presence of the identified virus, but do not rule out bacterial infection or co-infection with other pathogens not detected by the test.  Clinical correlation with patient history and  other diagnostic information is necessary to determine patient infection status.  The expected result is negative.  Fact Sheet for Patients:   StrictlyIdeas.no   Fact Sheet for Healthcare Providers:     BankingDealers.co.za    This test is not yet approved or cleared by the [place] FDA and  has been authorized for detection and/or diagnosis of SARS-CoV-2 by FDA under an Emergency Use Authorization (EUA).  This EUA will remain in effect (meaning  this test can be used) for the duration of  the COVID-19 declaration under Section 564(b)(1) of the Act, 21 U.S.C. section 360-bbb-3(b)(1), unless the authorization is terminated or revoked sooner.  Performed at [facility], [place]  [address], [place], [place] [address]       Radiology Studies: DG Tibia/Fibula Left  Result Date: 03/06/2020 CLINICAL DATA:  Lower extremity edema.  Weeping wounds. EXAM: LEFT TIBIA AND FIBULA - 2 VIEW COMPARISON:  No recent. FINDINGS: Diffuse soft tissue swelling. Osteopenia. Remnant of fibrous cortical defect or muscle insertion noted along the posterior distal femur. No significant acute or focal bony abnormality identified. No focal bony erosion. Peripheral vascular calcification. IMPRESSION: 1. Diffuse soft tissue swelling. No acute or focal bony abnormality identified. No evidence of bony erosion. 2.  Peripheral vascular disease. Electronically Signed   By: Marcello Moores  Register   On: 03/06/2020 13:44   DG Chest Port 1 View  Result Date: 03/06/2020 CLINICAL DATA:  Lower extremity edema and soft tissue wounds. EXAM: PORTABLE CHEST 1 VIEW COMPARISON:  November 04, 2019 FINDINGS: There is a persistent small pleural effusion on each side with atelectatic change in the bases. Lungs elsewhere are clear. There is cardiomegaly with pulmonary vascularity within normal limits. Pacemaker leads are attached to the right atrium and right ventricle. Patient is status post mitral valve replacement. There is aortic atherosclerosis. No adenopathy. No bone lesions. Surgical clips noted over the lateral right chest. IMPRESSION: Small pleural effusion on each side with bibasilar atelectasis. Lungs  elsewhere clear. Stable cardiomegaly. Pacemaker leads attached to right atrium and right ventricle. Status post mitral valve replacement. Aortic Atherosclerosis (ICD10-I70.0). Electronically Signed   By: Lowella Grip III M.D.   On: 03/06/2020 13:46   DG Foot Complete Left  Result Date: 03/06/2020 CLINICAL DATA:  Lower extremity edema and soft tissue wounds EXAM: LEFT FOOT - COMPLETE 3+ VIEW COMPARISON:  None. FINDINGS: Frontal, oblique, and lateral views were obtained. No evident fracture or dislocation. There is narrowing of all PIP and DIP joints. Other joint spaces appear unremarkable. No erosive change or bony destruction. There are multiple foci of arterial vascular calcification. IMPRESSION: Osteoarthritic change in multiple distal joints. No fracture or dislocation. No erosive change or bony destruction. No soft tissue air. Multiple foci of arterial vascular calcification noted. Electronically Signed   By: Lowella Grip III M.D.   On: 03/06/2020 13:39    Scheduled Meds: . vitamin C  500 mg Oral Daily  . heparin  5,000 Units Subcutaneous Q8H  . sodium chloride flush  3 mL Intravenous Q12H  . zinc sulfate  220 mg Oral Daily   Continuous Infusions: . albumin human Stopped (03/07/20 1024)  . cefTRIAXone (ROCEPHIN)  IV Stopped (03/06/20 1847)  . lactated ringers 50 mL/hr at 03/07/20 1048  . [START ON 03/08/2020] remdesivir 100 mg in NS 100 mL       LOS: 1 day   Time spent: 35 minutes.  Patrecia Pour, MD [facility] [email or web address] 03/07/2020, 2:28 PM

## 2020-03-07 NOTE — Progress Notes (Signed)
Palliative Medicine RN Note: Consult order noted for additional support in patient who is admitted as GIP for [facility]  She is currently in isolation for COVID-19 infection.  Spoke with Clementeen Hoof, liaison with [facility], who has spoken with and will be speaking again with the family. She will reach out to Dr Bonner Puna, who appears to have taken over as attending today.  We have requested a Spiritual Care consult for patient support, if she is awake. If she is unable to participate/wake up, then the hospice care team will need to reach out to the patient's family, as they should have a relationship established with them.   [place] team will continue [place] conversations with Ms Hebb's family during her admission. Discussed this referral with Dr Rowe Pavy, PMT attending, and there is not a role at this time. If there is a specific, palliative need, please call our office. However, [facility] will be the best resource for family support.  Marjie Skiff Verlin Duke, RN, BSN, [facility] Palliative Medicine Team 03/07/2020 11:25 AM Office [phone]

## 2020-03-07 NOTE — Progress Notes (Signed)
   03/07/20 1100  Clinical Encounter Type  Visited With Patient;Family  Visit Type Initial;Psychological support;Spiritual support;ED  Referral From Nurse  Consult/Referral To Nurse  Spiritual Encounters  Spiritual Needs Emotional;Other (Comment) (Spiritual Care Conversation/Support)  Stress Factors  Patient Stress Factors Not reviewed  Family Stress Factors Health changes   I spoke with her niece Arbie Cookey and provided support. I gave Arlenne a prayer shawl for comfort.   Please, contact Spiritual Care for further assistance.   Chaplain Shanon Ace M.Div., [facility]

## 2020-03-07 NOTE — Progress Notes (Signed)
[facility] ED: Manufacturing engineer [facility])  [name] is our current hospice patient with a terminal diagnosis of Heart Failure. I have spoken to the patients POA which is her niece Nancy Bush about transfer to [facility] by [facility]. She was aware and is in touch with [facility] RN (home nurse) Nancy Bush who was at bedside when facility decided to call EMS for transport. [facility] told family that patient has been declining and would send to ED for further work up. Patient is being admitted for Sepsis. This is a related admission.   Unable to visit patient at bedside however have frequently been in contact with niece Nancy Bush about goals of care and plan at discharge. Nancy Bush states that if patient does have a decline is overall status she is OK with comfort measures in place at that time. Please feel free to call Nancy Bush at anytime.  VS: BP 103/48, HR 60, RR 16, O2 99 [place] 2L I&O: 886/? Labs: Sodium: 132 (L) Chloride: 96 (L) Glucose: 130 (H) BUN: 26 (H) Creatinine: 1.69 (H) Calcium: 8.2 (L) Albumin: 3.7 AST: 59 (H) Total Protein: 6.2 (L) Total Bilirubin: 1.2 GFR, Est Non African American: 26 (L) GFR, Est African American: 30 (L) Troponin I (High Sensitivity): 71 (H) RBC: 2.94 (L) Hemoglobin: 8.2 (L) HCT: 26.4 (L) RDW: 16.1 (H) Platelets: 98 (L) Monocyte #: 1.6 (H) Abs Immature Granulocytes: 0.09 (H) D-Dimer, Quant: 5.41 (H)  IV/PRN:   remdesivir 100 mg in sodium chloride 0.9 % 100 mL IVPB  100 mg,   Intravenous,   200 mL/hr,   Daily         cefTRIAXone (ROCEPHIN) 1 g in sodium chloride 0.9 % 100 mL IVPB  1 g,   Intravenous,   200 mL/hr,   Every 24 hours    Morphine 2mg Nancy Bush PRN   Problem list: Cellulitis - L>R legs are weeping serous fluid; no obvious pus; legs are edematous and left leg is consistent with a cellulitis at this time (erythematous, calor, dolor, edema) - continue on Rocephin and monitor clinical response - wound care consult requested; patient on home hospice and suspect very  aggressive treatment (surgical debridement would be futile and overtreatment) - follow up blood cultures   Severe sepsis (HCC) - SIRS criteria considered RR 20 and leukocytosis that is relative to patient's typical baseline (WBC baseline 2-3k, now is 8.9), and she is also borderline hypothermic in ER further suggesting underlying sepsis; patient also on BB which may be falsely lowering HR in setting of SIRS/Sepsis - source considered LLE cellulitis. Possibly also covid however patient not hypoxic nor in respiratory distress (need to clarify vaccination status as well). She is toxic appearing on bedside evaluation, consistent with sepsis.  - organ dysfunction: lactate >2, MAP<65 (BP 98/45 in ER), acute metabolic encephalopathy - continue on abx and will modify as needed - given on hospice with home and overall poor quality of life, would be hesitant to escalate to pressors if needed, but will need to discuss with POAs if this becomes needed  COVID-19 virus infection - unclear if clinically significant at this time; borderline tachypnea (see sepsis), but no hypoxia; need to also clarify vaccination status tomorrow as well  - continue precautions - if becomes clinically significant and/or hypoxic will consider steroids but unsure how much escalation of care would be warranted until considered futile medical care and will need to be discussed with POA  A-fib ([place]) - MAR reviewed, will await med rec - eliquis and toprol noted - holding in  setting of sepsis for now - HSQ for now   AKI (acute kidney injury) ([place]) - differential includes prerenal vs cardiorenal (severe pulm HTN and chronic LE edema)  - did not receive full IVF in ER due to hx CHF - given uptrending lactic will give gentle IVF+albmin and monitor response; if creatinine worsens will trial aggressive diuresis if BP can tolerate; overall this speaks to poor prognosis    Normocytic anemia - currently at baseline; monitor  CBC    Discharge Planning: [facility] with Hospice Family Contact: Nancy Bush Niece POA IDG; updated Goals of Care: clear. DNR. Could potentially move to comfort measures if pt declines further.    Clementeen Hoof, [facility] (in [place]) [phone]

## 2020-03-07 NOTE — Progress Notes (Addendum)
Clinical/Bedside Swallow Evaluation Patient Details  Name: Nancy Bush MRN: [id number] Date of Birth: 1925/10/03  Today's Date: 03/07/2020 Time: SLP Start Time (ACUTE ONLY): 1135 SLP Stop Time (ACUTE ONLY): 1201 SLP Time Calculation (min) (ACUTE ONLY): 26 min  Past Medical History:  Past Medical History:  Diagnosis Date  . Asthma   . Atrial fibrillation ([place])   . CHF (congestive heart failure) ([place])   . COPD (chronic obstructive pulmonary disease) ([place])   . H/O: hysterectomy   . Hypertension   . LBBB (left bundle branch block)    per discharge note 2003  . Mitral regurgitation    MILD  . MVP (mitral valve prolapse)   . Pacemaker   . Pulmonary hypertension ([place])    MILD TO MODERATE BY ECHO  . PVC (premature ventricular contraction)   . Rheumatoid arthritis(714.0)   . S/P Maze operation for atrial fibrillation 05/18/2012   Complete biatrial lesion set using cryothermy with oversewing of LA appendage  . S/P mitral valve replacement with bioprosthetic valve 05/18/2012   62mm Edwards Magna Mitral pericardial bioprosthesis    Past Surgical History:  Past Surgical History:  Procedure Laterality Date  . ABDOMINAL HYSTERECTOMY    . APPENDECTOMY    . CATARACT EXTRACTION    . CHOLECYSTECTOMY    . hysterectomy    . MAZE  05/18/2012   Procedure: MAZE;  Surgeon: Rexene Alberts, MD;  Location: [place];  Service: Open Heart Surgery;  Laterality: Right;  . MITRAL VALVE REPLACEMENT  05/18/2012   Procedure: MINIMALLY INVASIVE MITRAL VALVE (MV) REPLACEMENT;  Surgeon: Rexene Alberts, MD;  Location: [place];  Service: Open Heart Surgery;  Laterality: N/A;  . OVARIAN CYST REMOVAL    . PACEMAKER INSERTION    . PERMANENT PACEMAKER INSERTION N/A 05/21/2012   Procedure: PERMANENT PACEMAKER INSERTION;  Surgeon: Evans Lance, MD;  Location: [facility] CATH LAB;  Service: Cardiovascular;  Laterality: N/A;  . TEE WITHOUT CARDIOVERSION  02/09/2012   Procedure: TRANSESOPHAGEAL ECHOCARDIOGRAM (TEE);   Surgeon: Fay Records, MD;  Location: [facility] ENDOSCOPY;  Service: Cardiovascular;  Laterality: N/A;   HPI:  Pt is a 86 yo female on hospice care for CHF, admitted with sepsis due to cellulitis of lower extremities, tested COVID + on 8/3.  Swallow evaluation was ordered by MD.  Pt's CXR showed Small pleural effusion on each side with bibasilar atelectasis. Lungs elsewhere clear.  Pt denies dysphagia and reports decreased desire for intake.  She is hypotensive and does become dyspneic with intake/effort.  Pt with expiratory wheeze noted and she reports some of this is baseline.  She denies dysphagia but states she has taken medicine with applesauce - and dislikes applesauce.    SLP spoke to [facility] with hospice to elicit goals of pt's care, Melissa reports no knowledge of pt having baseline dysphagia.  Given pt on hospice care, SLP advised Melissa that evaluation would be conducted to help educate pt to compensation strategies that may increase comfort, decrease coughing with intake.   Assessment / Plan / Recommendation Clinical Impression  Pt without focal CN deficits.  She does present with baseline cough without expectoration of secretions.  Swallow appeared timely and no oral retention noted.  Pt reports poor appetite and she only consumed a few bites/sips that SLP requested she accept.  Reviewed with pt strategies that may help her with swallowing with respiratory issues to maximize comfort/decrease asp risk.  Due to work of breathing, frequent coughing - advise she take her  pills with pudding, yogurt, icecream, etc - she DOES NOT like applesauce.  Recommend continue diet as tolerated and anticipate her nutrition to be very poor.  No SLP follow up needed as pt educated to compensations.  SLP assisted NT to place pt's left leg on a blanket with towels due to pt report of discomfort.  She requested to leave leg elevated for a while.  SLP Visit Diagnosis: Dysphagia, unspecified (R13.10)    Aspiration Risk   Mild aspiration risk    Diet Recommendation Regular;Thin liquid   Liquid Administration via: Spoon;Cup;Straw Medication Administration: Other (Comment) (prefer with icecream or pudding) Supervision: Patient able to self feed;Comment (set up assist) Compensations: Slow rate;Small sips/bites Postural Changes: Remain upright for at least 30 minutes after po intake    Other  Recommendations     Follow up Recommendations    n/a    Frequency and Duration   n/a         Prognosis   n/a     Swallow Study   General Date of Onset: 03/07/20 HPI: Pt is a 86 yo female on hospice care for CHF, admitted with sepsis due to cellulitis of lower extremities, tested COVID + on 8/3.  Swallow evaluation was ordered by MD.  Pt's CXR showed Small pleural effusion on each side with bibasilar atelectasis. Lungs elsewhere clear.  Pt denies dysphagia and reports decreased desire for intake.  She is hypotensive and does become dyspneic with intake/effort.  Pt with expiratory wheeze noted and she reports some of this is baseline.  She denies dysphagia but states she has taken medicine with applesauce - and dislikes applesauce.  Type of Study: Bedside Swallow Evaluation Diet Prior to this Study: Dysphagia 3 (soft);Thin liquids Temperature Spikes Noted: No Respiratory Status: Nasal cannula History of Recent Intubation: No Behavior/Cognition: Alert;Cooperative;Pleasant mood Oral Cavity - Dentition: Adequate natural dentition Vision: Functional for self-feeding Self-Feeding Abilities: Able to feed self Patient Positioning: Partially reclined (partially reclined in stretcher due to her discomfort) Baseline Vocal Quality: Low vocal intensity Volitional Cough: Strong Volitional Swallow: Unable to elicit    Oral/Motor/Sensory Function Overall Oral Motor/Sensory Function: Within functional limits   Ice Chips Ice chips: Not tested   Thin Liquid Thin Liquid: Within functional limits Presentation: Self Fed;Straw     Nectar Thick Nectar Thick Liquid: Not tested   Honey Thick Honey Thick Liquid: Not tested   Puree Puree: Within functional limits Presentation: Self Fed;Spoon   Solid     Solid: Within functional limits Presentation: Self Fed Other Comments: slow but effective mastication      Macario Golds 03/07/2020,12:57 PM  Kathleen [place], MS [place] Office [phone]

## 2020-03-07 NOTE — Plan of Care (Signed)
  Problem: Education: Goal: Knowledge of risk factors and measures for prevention of condition will improve Outcome: Progressing   Problem: Coping: Goal: Psychosocial and spiritual needs will be supported Outcome: Progressing   Problem: Respiratory: Goal: Will maintain a patent airway Outcome: Progressing Goal: Complications related to the disease process, condition or treatment will be avoided or minimized Outcome: Progressing   Problem: Education: Goal: Ability to demonstrate management of disease process will improve Outcome: Progressing Goal: Ability to verbalize understanding of medication therapies will improve Outcome: Progressing Goal: Individualized Educational Video(s) Outcome: Progressing   Problem: Activity: Goal: Capacity to carry out activities will improve Outcome: Progressing   Problem: Cardiac: Goal: Ability to achieve and maintain adequate cardiopulmonary perfusion will improve Outcome: Progressing

## 2020-03-07 NOTE — Consult Note (Signed)
WOC Nurse Consult Note: Consult requested for bilat legs.  Performed remotely after review of progress notes and photos in the EMR. Pt was wearing Una boots prior to admission and developed increased edema, erythremia, and weeping. Dark red patchy areas of full thickness wounds to BLE, left leg is more involved with cellulitis and possible vascular process.  Pt is in isolation for Covid and has hospice following her for comfort care goals prior to admission. Una boots are not appropriate at this time.  Dressing procedure/placement/frequency: Topical treatment orders provided for staff nurses to perform daily as follows to promote drying and healing: Apply Xeroform gauze to bilat leg wounds Q day, then cover with ABD pads and kerlex.   Please re-consult if further assistance is needed.  Thank-you,  Julien Girt MSN, [place], [place], [place], [place]

## 2020-03-07 NOTE — Progress Notes (Signed)
CRITICAL VALUE ALERT  Critical Value: trop=113  Date & Time Notied: 03/07/20 1655  Provider Notified: Dr. Bonner Puna  Orders Received/Actions taken: none

## 2020-03-08 ENCOUNTER — Inpatient Hospital Stay (HOSPITAL_COMMUNITY)

## 2020-03-08 LAB — CBC WITH DIFFERENTIAL/PLATELET
Abs Immature Granulocytes: 0.08 10*3/uL — ABNORMAL HIGH (ref 0.00–0.07)
Basophils Absolute: 0 10*3/uL (ref 0.0–0.1)
Basophils Relative: 0 %
Eosinophils Absolute: 0 10*3/uL (ref 0.0–0.5)
Eosinophils Relative: 0 %
HCT: 26.6 % — ABNORMAL LOW (ref 36.0–46.0)
Hemoglobin: 8.3 g/dL — ABNORMAL LOW (ref 12.0–15.0)
Immature Granulocytes: 1 %
Lymphocytes Relative: 7 %
Lymphs Abs: 0.6 10*3/uL — ABNORMAL LOW (ref 0.7–4.0)
MCH: 28.3 pg (ref 26.0–34.0)
MCHC: 31.2 g/dL (ref 30.0–36.0)
MCV: 90.8 fL (ref 80.0–100.0)
Monocytes Absolute: 1.4 10*3/uL — ABNORMAL HIGH (ref 0.1–1.0)
Monocytes Relative: 17 %
Neutro Abs: 6.2 10*3/uL (ref 1.7–7.7)
Neutrophils Relative %: 75 %
Platelets: 106 10*3/uL — ABNORMAL LOW (ref 150–400)
RBC: 2.93 MIL/uL — ABNORMAL LOW (ref 3.87–5.11)
RDW: 16.1 % — ABNORMAL HIGH (ref 11.5–15.5)
WBC: 8.4 10*3/uL (ref 4.0–10.5)
nRBC: 0 % (ref 0.0–0.2)

## 2020-03-08 LAB — COMPREHENSIVE METABOLIC PANEL
ALT: 24 U/L (ref 0–44)
AST: 71 U/L — ABNORMAL HIGH (ref 15–41)
Albumin: 3.1 g/dL — ABNORMAL LOW (ref 3.5–5.0)
Alkaline Phosphatase: 55 U/L (ref 38–126)
Anion gap: 11 (ref 5–15)
BUN: 30 mg/dL — ABNORMAL HIGH (ref 8–23)
CO2: 22 mmol/L (ref 22–32)
Calcium: 8 mg/dL — ABNORMAL LOW (ref 8.9–10.3)
Chloride: 97 mmol/L — ABNORMAL LOW (ref 98–111)
Creatinine, Ser: 1.82 mg/dL — ABNORMAL HIGH (ref 0.44–1.00)
GFR calc Af Amer: 28 mL/min — ABNORMAL LOW (ref >60)
GFR calc non Af Amer: 24 mL/min — ABNORMAL LOW (ref >60)
Glucose, Bld: 101 mg/dL — ABNORMAL HIGH (ref 70–99)
Potassium: 4 mmol/L (ref 3.5–5.1)
Sodium: 130 mmol/L — ABNORMAL LOW (ref 135–145)
Total Bilirubin: 1 mg/dL (ref 0.3–1.2)
Total Protein: 5.6 g/dL — ABNORMAL LOW (ref 6.5–8.1)

## 2020-03-08 LAB — PROCALCITONIN: Procalcitonin: 2.08 ng/mL

## 2020-03-08 LAB — C-REACTIVE PROTEIN: CRP: 12.4 mg/dL — ABNORMAL HIGH (ref <1.0)

## 2020-03-08 LAB — MAGNESIUM: Magnesium: 1.9 mg/dL (ref 1.7–2.4)

## 2020-03-08 MED ORDER — ONDANSETRON HCL 4 MG/2ML IJ SOLN: 4.0000 mg | Freq: Four times a day (QID) | INTRAMUSCULAR | Status: DC | PRN

## 2020-03-08 MED ORDER — VALACYCLOVIR HCL 500 MG PO TABS
1000.0000 mg | ORAL_TABLET | Freq: Every day | ORAL | Status: DC
  Filled 2020-03-08: qty 2

## 2020-03-08 MED ORDER — HYDROMORPHONE HCL 1 MG/ML PO LIQD: 1.0000 mg | ORAL | Status: DC | PRN

## 2020-03-08 MED ORDER — ACYCLOVIR 400 MG PO TABS: 800.0000 mg | ORAL_TABLET | Freq: Every day | ORAL | Status: DC

## 2020-03-08 MED ORDER — HYDROMORPHONE HCL 1 MG/ML PO LIQD: 0.5000 mg | ORAL | Status: DC | PRN

## 2020-03-08 NOTE — Progress Notes (Signed)
PROGRESS NOTE  Nancy Bush  PPI:[id number] DOB: 1926-02-12 DOA: 03/06/2020 PCP: Lawerance Cruel, MD  Outpatient Specialists: [facility] Brief Narrative: Nancy Bush is a 86 y.o. female with a history of PAF s/p MAZE, bioprosthetic MVR, RA, pulmonary HTN, CHF, and COPD who presented from ALF due to progressive decline and concern for left leg infection. On arrival she was afebrile and stable on room air with Lactic 2.6>>3.8 Na 130, creat 1.41, BUN 21, Alb 2.9, TB 1.7 WBC 8.9 (baseline 2-3), Hgb 8.9 (baseline 9-10), PLTC 111 (chronically low) SARS-CoV-2 positive. Vancomycin, ceftriaxone given as well as IV fluids and she was admitted with ceftriaxone continued, remdesivir started.   Assessment & Plan: Principal Problem:   Severe sepsis ([place]) Active Problems:   A-fib (H[facility])   Cellulitis   COVID-19 virus infection   AKI (acute kidney injury) ([place])   Normocytic anemia  Covid-19 infection: SARS-CoV-2 PCR positive on 8/3. No hypoxemia and no typical CXR infiltrates to confirm pneumonia.  - Will continue remdesivir, though without hypoxemia and with active bacterial infection, will hold steroids for now.  - Continue airborne, contact precautions for 21 days from positive testing.  Severe sepsis due to nonpurulent cellulitis of LLE w/lactic acid elevation and hypotension:  - Continue ceftriaxone. PCT and CRP elevated primarily due to bacterial infection.  - Unable to bolus fluids w/severe CHF/pulmonary HTN, unable to diurese with hypotension/renal failure - Monitor blood cultures, urine culture w/20k GNRs.  Possible shingles: Pt started on antiviral as outpatient on day of admission. No vesicles on my exam, though eruption does involve predominantly left L4 dermatome.  - Continue antiviral. Will change to valacyclovir and dose renally. - Continue to cover the lesions. Do not suspect disseminated disease and pt is immunocompetent, so no airborne/contact precautions  necessary at this time.  Chronic HFpEF, pulmonary HTN, severe TR, MR s/p bioprosthetic MVR: Not a candidate for valvular intervention. - Having to hold diuretic in setting of sepsis. Continuing palliative care discussions.  - Repeat CXR demonstrating pulmonary edema and growing effusions on my personal review.  If hypoxemia is noted, would repeat CXR to evaluate pleural effusions as she was admitted with this recently, and would have to stop IVF.  Demand myocardial ischemia: Troponin elevation is mild with no chest pain not consistent with ACS.  - Monitor. Unable to give beta blocker with hypotension and remains borderline bradycardic.  Paroxysmal AFib: s/p MAZE.  - Holding beta blocker to avoid depression of cardiac output. With low hgb and hypotension will avoid anticoagulation; with severely low CrCl, will stop DOAC as well.   ARF on stage IIIb CKD: Worsening despite fluid resuscitation. - IV fluids stopped due to developing anasarca. Holding diuresis due to renal failure. - Avoid nephrotoxins, vancomycin discontinued.   Thrombocytopenia, AOCKD: - Monitor  Deconditioning, failure to thrive: Reported by her 70, Niece, of a steady decline since enrolling in Hospice in April 2021.  - In setting of end-stage CHF complicated by worsening AKI despite measured volume resuscitation required for sepsis due to cellulitis and covid-19 infection, I anticipate a poor prognosis, especially when her premorbid decline and essential anorexia resulting in malnutrition is taken into consideration. I've recommended pursuing residential hospice placement based on this assessment. The patient agrees to this plan of care at this time.  DVT prophylaxis: Heparin Code Status: DNR, no escalation of care Family Communication: None at bedside; spoke with Niece/HCPOA by phone Disposition Plan:  Status is: Inpatient; will change admission order to progressive care unit.  Remains inpatient appropriate  because:Hemodynamically unstable, Persistent severe electrolyte disturbances and Inpatient level of care appropriate due to severity of illness  Dispo: The patient is from: ALF              Anticipated d/c is to: TBD, possibly hospice [facility]) 03/09/2020 depending on clinical trajectory.              Anticipated d/c date is: 8/6              Patient currently is not medically stable to d/c.  Consultants:   Palliative care  Procedures:   None  Antimicrobials:  Vancomycin 8/3  Ceftriaxone 8/3 >>  Remdesivir 8/3 >>   Subjective: Pain in back improved with tylenol, LLE remains tender when touched. Hasn't eaten much of anything. Agrees to pursue hospice placement.  Objective: Vitals:   03/07/20 2008 03/08/20 0011 03/08/20 0400 03/08/20 1217  BP: (!) 141/77 (!) 108/49 (!) 107/49 (!) 98/40  Pulse: (!) 59 60 (!) 59 60  Resp:  18 15   Temp: 97.6 F (36.4 C) 98 F (36.7 C) 98.4 F (36.9 C) 98.2 F (36.8 C)  TempSrc: Oral Oral Axillary Oral  SpO2: 100% 100% 100% 96%  Weight:      Height:        Intake/Output Summary (Last 24 hours) at 03/08/2020 1223 Last data filed at 03/08/2020 1220 Gross per 24 hour  Intake 764.31 ml  Output 50 ml  Net 714.31 ml   Filed Weights   03/06/20 1144 03/07/20 1624  Weight: 47.2 kg 58.5 kg   Gen: Elderly female in no distress, resting quietly. Pulm: Nonlabored breathing room air. Diminished with crackles at bases. CV: Regular borderline bradycardia. No murmur, rub, or gallop. Diffuse pitting edema. GI: Abdomen soft, non-tender, non-distended, with normoactive bowel sounds.  Ext: Warm, no deformities Skin: LE's wrapped in kerlix, tender LLE, stable, no tracking erythema. Dressings c/d/i. DP pulses symmetric, cap refill intact. Neuro: Drowsy but rousable and oriented and capable of making her own medical decisions. No focal neurological deficits. Psych: Judgement and insight appear fair. Mood euthymic & affect congruent. Behavior is  appropriate.    Data Reviewed: I have personally reviewed following labs and imaging studies  CBC: Recent Labs  Lab 03/06/20 1203 03/07/20 0743 03/07/20 1042 03/08/20 0348  WBC 8.9 8.0 7.7 8.4  NEUTROABS 5.7 5.3 5.1 6.2  HGB 8.9* 7.8* 8.2* 8.3*  HCT 28.6* 24.7* 26.4* 26.6*  MCV 89.9 87.9 89.8 90.8  PLT 111* 101* 98* 161*   Basic Metabolic Panel: Recent Labs  Lab 03/06/20 1203 03/07/20 0743 03/07/20 1042 03/08/20 0348  NA 130* 133* 132* 130*  K 4.3 4.3 4.5 4.0  CL 94* 97* 96* 97*  CO2 25 24 26 22   GLUCOSE 134* 128* 130* 101*  BUN 21 26* 26* 30*  CREATININE 1.41* 1.57* 1.69* 1.82*  CALCIUM 7.7* 8.1* 8.2* 8.0*  MG  --  2.0  --  1.9   GFR: Estimated Creatinine Clearance: 15.7 mL/min (A) (by C-G formula based on SCr of 1.82 mg/dL (H)). Liver Function Tests: Recent Labs  Lab 03/06/20 1203 03/07/20 0743 03/07/20 1042 03/08/20 0348  AST 35 53* 59* 71*  ALT 13 18 20 24   ALKPHOS 76 59 56 55  BILITOT 1.7* 1.3* 1.2 1.0  PROT 6.1* 5.9* 6.2* 5.6*  ALBUMIN 2.9* 3.3* 3.7 3.1*   No results for input(s): LIPASE, AMYLASE in the last 168 hours. No results for input(s): AMMONIA in the last 168 hours.  Coagulation Profile: Recent Labs  Lab 03/06/20 1203  INR 1.4*   Cardiac Enzymes: No results for input(s): CKTOTAL, CKMB, CKMBINDEX, TROPONINI in the last 168 hours. BNP (last 3 results) No results for input(s): PROBNP in the last 8760 hours. HbA1C: No results for input(s): HGBA1C in the last 72 hours. CBG: No results for input(s): GLUCAP in the last 168 hours. Lipid Profile: No results for input(s): CHOL, HDL, LDLCALC, TRIG, CHOLHDL, LDLDIRECT in the last 72 hours. Thyroid Function Tests: No results for input(s): TSH, T4TOTAL, FREET4, T3FREE, THYROIDAB in the last 72 hours. Anemia Panel: Recent Labs    03/07/20 1042  FERRITIN 66   Urine analysis:    Component Value Date/Time   COLORURINE AMBER (A) 03/06/2020 2130   APPEARANCEUR CLOUDY (A) 03/06/2020 2130    APPEARANCEUR Clear 10/12/2018 1429   LABSPEC 1.018 03/06/2020 2130   PHURINE 5.0 03/06/2020 2130   GLUCOSEU NEGATIVE 03/06/2020 2130   HGBUR LARGE (A) 03/06/2020 2130   BILIRUBINUR NEGATIVE 03/06/2020 2130   BILIRUBINUR Negative 10/12/2018 [place] 03/06/2020 2130   PROTEINUR 100 (A) 03/06/2020 2130   UROBILINOGEN 0.2 08/21/2012 1645   NITRITE NEGATIVE 03/06/2020 2130   LEUKOCYTESUR MODERATE (A) 03/06/2020 2130   Recent Results (from the past 240 hour(s))  Blood Culture (routine x 2)     Status: None (Preliminary result)   Collection Time: 03/06/20 12:25 PM   Specimen: BLOOD RIGHT FOREARM  Result Value Ref Range Status   Specimen Description   Final    BLOOD RIGHT FOREARM Performed at [facility], [place] [address], [place], [place] [address]    Special Requests   Final    BOTTLES DRAWN AEROBIC AND ANAEROBIC Blood Culture results may not be optimal due to an inadequate volume of blood received in culture bottles Performed at [place] [address], [place], [place] [address]    Culture   Final    NO GROWTH < 24 HOURS Performed at [place] [facility] [address], [place], [place] [address]    Report Status PENDING  Incomplete  Blood Culture (routine x 2)     Status: None (Preliminary result)   Collection Time: 03/06/20 12:34 PM   Specimen: BLOOD LEFT FOREARM  Result Value Ref Range Status   Specimen Description   Final    BLOOD LEFT FOREARM Performed at [place] [address] [place], [place] [address]    Special Requests   Final    BOTTLES DRAWN AEROBIC AND ANAEROBIC Blood Culture results may not be optimal due to an inadequate volume of blood received in culture bottles Performed at [facility] [place] [address] [place], [place] [address]    Culture   Final    NO GROWTH < 24 HOURS Performed at [place] [address], [place], [place] [address]     Report Status PENDING  Incomplete  SARS Coronavirus 2 by RT PCR (hospital order, performed in [facility] hospital lab) Nasopharyngeal Nasopharyngeal Swab     Status: Abnormal   Collection Time: 03/06/20  2:41 PM   Specimen: Nasopharyngeal Swab  Result Value Ref Range Status   SARS Coronavirus 2 POSITIVE (A) NEGATIVE Final    Comment: RESULT CALLED TO, READ BACK BY AND VERIFIED WITH: C.FRANKLIN AT 0347 ON 03/06/20 BY N.THOMPSON (NOTE) SARS-CoV-2 target nucleic acids are DETECTED  SARS-CoV-2 RNA is generally detectable in upper respiratory specimens  during the acute phase of infection.  Positive results are indicative  of the presence of the identified virus, but do not rule out bacterial infection or co-infection with other pathogens not detected by the test.  Clinical correlation with patient history and  other diagnostic information is necessary to determine patient infection status.  The expected result is negative.  Fact Sheet for Patients:   StrictlyIdeas.no   Fact Sheet for Healthcare Providers:   BankingDealers.co.za    This test is not yet approved or cleared by the [place] FDA and  has been authorized for detection and/or diagnosis of SARS-CoV-2 by FDA under an Emergency Use Authorization (EUA).  This EUA will remain in effect (meaning  this test can be used) for the duration of  the COVID-19 declaration under Section 564(b)(1) of the Act, 21 U.S.C. section 360-bbb-3(b)(1), unless the authorization is terminated or revoked sooner.  Performed at [facility], [place] [address] [place], [place] [address]   Urine culture     Status: Abnormal (Preliminary result)   Collection Time: 03/06/20  9:30 PM   Specimen: In/Out Cath Urine  Result Value Ref Range Status   Specimen Description   Final    IN/OUT CATH URINE Performed at [place] [address], [place], [place] [address]     Special Requests   Final    NONE Performed at [facility], [place] [address], [place], [place] [address]    Culture (A)  Final    20,000 COLONIES/mL ESCHERICHIA COLI SUSCEPTIBILITIES TO FOLLOW Performed at [facility], [place] [address] [place], [place] [address]    Report Status PENDING  Incomplete      Radiology Studies: DG Tibia/Fibula Left  Result Date: 03/06/2020 CLINICAL DATA:  Lower extremity edema.  Weeping wounds. EXAM: LEFT TIBIA AND FIBULA - 2 VIEW COMPARISON:  No recent. FINDINGS: Diffuse soft tissue swelling. Osteopenia. Remnant of fibrous cortical defect or muscle insertion noted along the posterior distal femur. No significant acute or focal bony abnormality identified. No focal bony erosion. Peripheral vascular calcification. IMPRESSION: 1. Diffuse soft tissue swelling. No acute or focal bony abnormality identified. No evidence of bony erosion. 2.  Peripheral vascular disease. Electronically Signed   By: Marcello Moores  Register   On: 03/06/2020 13:44   DG CHEST PORT 1 VIEW  Result Date: 03/08/2020 CLINICAL DATA:  COVID.  Pleural effusion. EXAM: PORTABLE CHEST 1 VIEW COMPARISON:  03/06/2020. FINDINGS: Cardiac pacer stable position. Prior cardiac valve replacement. Cardiomegaly with pulmonary venous congestion. Bilateral interstitial prominence and bilateral pleural effusions. Findings suggest CHF. Pneumonitis cannot be excluded. Bibasilar atelectasis. No pneumothorax IMPRESSION: 1. Cardiac pacer stable position. Prior cardiac valve replacement. Cardiomegaly with pulmonary venous congestion, bilateral pulmonary interstitial prominence, and bilateral pleural effusions. Findings suggest CHF. Pneumonitis cannot be excluded. 2.  Bibasilar atelectasis. Electronically Signed   By: Marcello Moores  Register   On: 03/08/2020 08:06   DG Chest Port 1 View  Result Date: 03/06/2020 CLINICAL DATA:  Lower extremity edema and soft tissue wounds. EXAM: PORTABLE CHEST 1 VIEW COMPARISON:  November 04, 2019 FINDINGS: There is a persistent small pleural effusion on each side with atelectatic change in the bases. Lungs elsewhere are clear. There is cardiomegaly with pulmonary vascularity within normal limits. Pacemaker leads are attached to the right atrium and right ventricle. Patient is status post mitral valve replacement. There is aortic atherosclerosis. No adenopathy. No bone lesions. Surgical clips noted over the lateral right chest. IMPRESSION: Small pleural effusion on each side with bibasilar atelectasis.  Lungs elsewhere clear. Stable cardiomegaly. Pacemaker leads attached to right atrium and right ventricle. Status post mitral valve replacement. Aortic Atherosclerosis (ICD10-I70.0). Electronically Signed   By: Lowella Grip III M.D.   On: 03/06/2020 13:46   DG Foot Complete Left  Result Date: 03/06/2020 CLINICAL DATA:  Lower extremity edema and soft tissue wounds EXAM: LEFT FOOT - COMPLETE 3+ VIEW COMPARISON:  None. FINDINGS: Frontal, oblique, and lateral views were obtained. No evident fracture or dislocation. There is narrowing of all PIP and DIP joints. Other joint spaces appear unremarkable. No erosive change or bony destruction. There are multiple foci of arterial vascular calcification. IMPRESSION: Osteoarthritic change in multiple distal joints. No fracture or dislocation. No erosive change or bony destruction. No soft tissue air. Multiple foci of arterial vascular calcification noted. Electronically Signed   By: Lowella Grip III M.D.   On: 03/06/2020 13:39    Scheduled Meds: . vitamin C  500 mg Oral Daily  . heparin  5,000 Units Subcutaneous Q8H  . sodium chloride flush  3 mL Intravenous Q12H  . zinc sulfate  220 mg Oral Daily   Continuous Infusions: . cefTRIAXone (ROCEPHIN)  IV 1 g (03/07/20 2123)  . lactated ringers 50 mL/hr at 03/07/20 1048  . remdesivir 100 mg in NS 100 mL 100 mg (03/08/20 0914)     LOS: 2 days   Time spent: 35 minutes.  Patrecia Pour, MD [facility] [email or web address] 03/08/2020, 12:23 PM

## 2020-03-08 NOTE — TOC Initial Note (Signed)
Transition of Care [facility]) - Initial/Assessment Note    Patient Details  Name: Nancy Bush MRN: [id number] Date of Birth: 1925/09/03  Transition of Care [facility]) CM/SW Contact:    Ross Ludwig, LCSW Phone Number: 03/08/2020, 6:58 PM  Clinical Narrative:                  Patient is a 86 year old female who is alert and oriented x2.  Patient is a current patient of [facility].  Patient is Covid +, family have decided to pursue hospice facility placement due to patient declining, and also family does not feel she is able to receive the proper level of care at [facility].  CSW spoke to patient's niece who is the [place], and she would like [facility], [place] informed her that they do not take Covid+ patients.  CSW informed her that [facility] does accept Covid patients.  Patient's niece agreed to having patient go to hospice facility in [place] if she is approved.  CSW made referral to [facility] liaison and she will speak to patient's family and review case.  CSW to continue to follow patient's progress throughout discharge planning.  Expected Discharge Plan: [place] Barriers to Discharge: Hospice Bed not available   Patient Goals and CMS Choice Patient states their goals for this hospitalization and ongoing recovery are:: To go to hospice facility for end of life care. CMS Medicare.gov Compare Post Acute Care list provided to:: Patient Represenative (must comment) Choice offered to / list presented to : [place] / Guardian  Expected Discharge Plan and Services Expected Discharge Plan: [place] In-house Referral: Clinical Social Work   Post Acute Care Choice: Hospice Living arrangements for the past 2 months: [place] [employer])                   DME Agency: NA       HH Arranged: NA          Prior Living Arrangements/Services Living arrangements for the past 2 months: [place] [employer]) Lives with:: Facility Resident Patient language and need for interpreter reviewed:: Yes Do you feel safe going back to the place where you live?: No   Patient's family would like her to go to hospice facility for end of life care.  Need for Family Participation in Patient Care: Yes (Comment) Care giver support system in place?: Yes (comment) Current home services: Hospice Criminal Activity/Legal Involvement Pertinent to Current Situation/Hospitalization: No - Comment as needed  Activities of Daily Living Home Assistive Devices/Equipment: Walker (specify type), Eyeglasses ADL Screening (condition at time of admission) Patient's cognitive ability adequate to safely complete daily activities?: No Is the patient deaf or have difficulty hearing?: Yes Does the patient have difficulty seeing, even when wearing glasses/contacts?: No Does the patient have difficulty concentrating, remembering, or making decisions?: Yes Patient able to express need for assistance with ADLs?: Yes Does the patient have difficulty dressing or bathing?: Yes Independently performs ADLs?: No Communication: Independent Dressing (OT): Needs assistance Is this a change from baseline?: Pre-admission baseline Grooming: Needs assistance Is this a change from baseline?: Pre-admission baseline Feeding: Independent Bathing: Needs assistance Is this a change from baseline?: Pre-admission baseline Toileting: Needs assistance Is this a change from baseline?: Pre-admission baseline In/Out Bed: Needs assistance Is this a change from baseline?: Pre-admission baseline Walks in Home: Needs assistance Is this a change from baseline?: Pre-admission baseline Does the patient  have difficulty walking or climbing stairs?: Yes Weakness of Legs: Both Weakness of Arms/Hands: Both  Permission Sought/Granted Permission sought to share information with : Facility Sport and exercise psychologist, Family  Supports Permission granted to share information with : Yes, Release of Information Signed  Share Information with NAME: McGee,Carolyn Niece [phone]  [phone]  Permission granted to share info w AGENCY: Hospice facility        Emotional Assessment Appearance:: Appears stated age Attitude/Demeanor/Rapport: Lethargic Affect (typically observed): Calm, Appropriate Orientation: : Oriented to Self, Oriented to Place Alcohol / Substance Use: Not Applicable Psych Involvement: No (comment)  Admission diagnosis:  Cellulitis [L03.90] Pleural effusion [J90] Left leg cellulitis [L03.116] Severe sepsis ([place]) [A41.9, R65.20] COVID-19 [U07.1] Patient Active Problem List   Diagnosis Date Noted  . Cellulitis 03/06/2020  . Severe sepsis ([place]) 03/06/2020  . COVID-19 virus infection 03/06/2020  . AKI (acute kidney injury) ([place]) 03/06/2020  . Normocytic anemia 03/06/2020  . Failure to thrive (0-17) 12/08/2019  . CHF exacerbation ([place]) 10/26/2019  . Educated about COVID-19 virus infection 03/20/2019  . Hypotension 03/18/2019  . S/P MVR (mitral valve repair) 03/18/2019  . Pancytopenia ([place]) 05/21/2018  . TGA (transient global amnesia) 05/19/2018  . Essential hypertension 12/11/2017  . Medication management 12/11/2017  . Pulmonary HTN ([place]) 12/30/2016  . Protein calorie malnutrition ([place]) 08/22/2012  . CAP (community acquired pneumonia) 08/21/2012  . Hypokalemia 08/21/2012  . SOB (shortness of breath) 08/20/2012  . FTT (failure to thrive) in adult 08/20/2012  . Leucocytosis 08/20/2012  . Atrial flutter ([place]) 06/24/2012  . CKD (chronic kidney disease), stage III 06/24/2012  . Pleural effusion 06/24/2012  . [employer] 05/25/2012  . Bradycardia 05/21/2012  . S/P mitral valve replacement with bioprosthetic valve 05/18/2012  . S/P Maze operation for atrial fibrillation 05/18/2012  . Severe mitral regurgitation 05/17/2012  . A-fib ([place]) 05/17/2012  . MR (mitral  regurgitation) 05/03/2012  . Fatigue 01/29/2012  . Acute on chronic diastolic heart failure ([place]) 06/10/2011  . CKD (chronic kidney disease) 06/03/2011  . Encounter for long-term (current) use of anticoagulants 05/26/2011  . Atrial fibrillation ([place]) 05/19/2011  . CHEST PAIN UNSPECIFIED 08/28/2010  . ABNORMAL ELECTROCARDIOGRAM 11/14/2009  . Mitral Regurgitation 11/12/2009  . PULMONARY HYPERTENSION 11/12/2009  . Mitral valve disorder 11/12/2009  . SINUSITIS, CHRONIC 06/12/2007  . ALLERGIC RHINITIS 06/12/2007  . BRONCHITIS 06/12/2007  . COPD 06/12/2007   PCP:  Lawerance Cruel, MD Pharmacy:   [id number] [place], [place] - [address] [place] [address] Phone: [phone] Fax: [phone]  [facility] [place] [place] [facility] [address] [place] [address] Phone: [phone] Fax: [phone]     Social Determinants of Health (SDOH) Interventions    Readmission Risk Interventions Readmission Risk Prevention Plan 03/08/2020  Transportation Screening Complete  Medication Review (RN Care Manager) Referral to Pharmacy  PCP or Specialist appointment within 3-5 days of discharge Complete  HRI or Home Care Consult Patient refused  SW Recovery Care/Counseling Consult Complete  Palliative Care Screening Complete  [place] Not Applicable  Some recent data might be hidden

## 2020-03-08 NOTE — Progress Notes (Signed)
[facility])   Ms. Scalia is our current hospice patient with a terminal diagnosis of Heart Failure. I have spoken to the patients POA which is her niece Hoyle Sauer about transfer to [facility] by [facility]. She was aware and is in touch with [facility] RN (home nurse) Meagan who was at bedside when facility decided to call EMS for transport. [facility] told family that patient has been declining and would send to ED for further work up. Patient is being admitted for Sepsis. This is a related admission.    Spoke with niece Hoyle Sauer by phone who verbalizes understanding that pt may not be responding to antibiotic treatment.  Carolyn verbalizes plan to discuss goals of care with MD today, share that her husband died within the year at [facility] and that this process is made more challenging by the grief that brings up for her.  Active listening provided.  Discussed that, with Covid + test, patient would not be able to transfer to [facility] for residential hospice.  Hoyle Sauer verbalized understanding that comfort care was available in the hospital setting with Palliative Medical Team available as needed.  No new questions at this time. Contact number for hospital liaison provided to [facility].    VS: 98.4 axillary, 107/49, 59 HR, 15 RR, 100% 2L [place] I&O: 1210.9/- Labs:  Na 130, BUN 30, Creat 1.82, Ca 8.0, Albumin 3.1, AST 71, total protein 5.6, GFR 24, troponin 113, CRP 12.4, Hgb 8.3, Plate 106  IV/PRN:  remdesivir 100 mg PIV qD,      lactated ringer 50 mg/hr continuous PIV, rocephin 1g qD PIV, heparin 5000 units [place], Tylenol 325mg  PO x1 dose, Tussinex 67mL x 1 dose    Problem list: Covid-19 infection: SARS-CoV-2 PCR positive on 8/3. No hypoxemia and no typical CXR infiltrates to confirm pneumonia.  - Will continue remdesivir, though without hypoxemia and with active bacterial infection, will hold steroids for now.  - Continue airborne, contact precautions for 21 days from positive testing.   Severe sepsis  due to nonpurulent cellulitis of LLE w/lactic acid elevation and hypotension:  - Agree with continuing ceftriaxone alone for now. PCT and CRP elevated primarily due to bacterial infection.  - Unable to bolus fluids w/severe CHF/pulmonary HTN, unable to diurese with hypotension. - Monitor blood cultures and urine culture (pyuria and many bacteria on micro)   Chronic HFpEF, pulmonary HTN, severe TR, MR s/p bioprosthetic MVR: Not a candidate for valvular intervention. - Having to hold diuretic in setting of sepsis. Continuing palliative care discussions.  - If hypoxemia is noted, would repeat CXR to evaluate pleural effusions as she was admitted with this recently, and would have to stop IVF.   Demand myocardial ischemia: Troponin elevation is mild with no chest pain not consistent with ACS.  - Monitor. Unable to give beta blocker with hypotension and remains borderline bradycardic.   Paroxysmal AFib: s/p MAZE.  - Continue metoprolol. With low hgb and hypotension will avoid anticoagulation; with severely low CrCl, will stop DOAC as well.    AKI on stage IIIb CKD: Worsening despite fluid resuscitation. - Continuing IV fluids - Avoid nephrotoxins, vancomycin discontinued.    Thrombocytopenia, AOCKD: - Monitor   Deconditioning, failure to thrive: Reported by her 47, Niece, of a steady decline since enrolling in Hospice in April 2021. She confirms she would not want resuscitation and would not want escalation of therapy. We will see how her clinical picture evolves over the next 24 hours, though I've shared my suspicion  that her multiple organ failures are not likely to improve despite any medical therapies. I've recommended pursuing residential hospice placement.   Discharge Planning: unclear at this time Family Contact: Carolyn Niece POA IDG; updated Goals of Care: clear. DNR. Niece Rosita Fire desire for comfort care if patient declines further  Domenic Moras, BSN, RN [facility] hospital  liaison [phone] (24h on call)

## 2020-03-09 DIAGNOSIS — Z515 Encounter for palliative care: Secondary | ICD-10-CM

## 2020-03-09 LAB — CBC WITH DIFFERENTIAL/PLATELET
Abs Immature Granulocytes: 0.07 10*3/uL (ref 0.00–0.07)
Basophils Absolute: 0 10*3/uL (ref 0.0–0.1)
Basophils Relative: 0 %
Eosinophils Absolute: 0.1 10*3/uL (ref 0.0–0.5)
Eosinophils Relative: 1 %
HCT: 28.3 % — ABNORMAL LOW (ref 36.0–46.0)
Hemoglobin: 8.5 g/dL — ABNORMAL LOW (ref 12.0–15.0)
Immature Granulocytes: 1 %
Lymphocytes Relative: 10 %
Lymphs Abs: 0.7 10*3/uL (ref 0.7–4.0)
MCH: 28.1 pg (ref 26.0–34.0)
MCHC: 30 g/dL (ref 30.0–36.0)
MCV: 93.4 fL (ref 80.0–100.0)
Monocytes Absolute: 1.4 10*3/uL — ABNORMAL HIGH (ref 0.1–1.0)
Monocytes Relative: 19 %
Neutro Abs: 4.8 10*3/uL (ref 1.7–7.7)
Neutrophils Relative %: 69 %
Platelets: 109 10*3/uL — ABNORMAL LOW (ref 150–400)
RBC: 3.03 MIL/uL — ABNORMAL LOW (ref 3.87–5.11)
RDW: 16.2 % — ABNORMAL HIGH (ref 11.5–15.5)
WBC: 7.1 10*3/uL (ref 4.0–10.5)
nRBC: 0 % (ref 0.0–0.2)

## 2020-03-09 LAB — COMPREHENSIVE METABOLIC PANEL
ALT: 24 U/L (ref 0–44)
AST: 66 U/L — ABNORMAL HIGH (ref 15–41)
Albumin: 3 g/dL — ABNORMAL LOW (ref 3.5–5.0)
Alkaline Phosphatase: 59 U/L (ref 38–126)
Anion gap: 16 — ABNORMAL HIGH (ref 5–15)
BUN: 36 mg/dL — ABNORMAL HIGH (ref 8–23)
CO2: 20 mmol/L — ABNORMAL LOW (ref 22–32)
Calcium: 8.3 mg/dL — ABNORMAL LOW (ref 8.9–10.3)
Chloride: 97 mmol/L — ABNORMAL LOW (ref 98–111)
Creatinine, Ser: 2.05 mg/dL — ABNORMAL HIGH (ref 0.44–1.00)
GFR calc Af Amer: 24 mL/min — ABNORMAL LOW (ref >60)
GFR calc non Af Amer: 21 mL/min — ABNORMAL LOW (ref >60)
Glucose, Bld: 99 mg/dL (ref 70–99)
Potassium: 4.3 mmol/L (ref 3.5–5.1)
Sodium: 133 mmol/L — ABNORMAL LOW (ref 135–145)
Total Bilirubin: 1 mg/dL (ref 0.3–1.2)
Total Protein: 5.8 g/dL — ABNORMAL LOW (ref 6.5–8.1)

## 2020-03-09 LAB — URINE CULTURE: Culture: 20000 — AB

## 2020-03-09 LAB — PROCALCITONIN: Procalcitonin: 1.99 ng/mL

## 2020-03-09 LAB — C-REACTIVE PROTEIN: CRP: 11.4 mg/dL — ABNORMAL HIGH (ref <1.0)

## 2020-03-09 LAB — MAGNESIUM: Magnesium: 2 mg/dL (ref 1.7–2.4)

## 2020-03-09 MED ORDER — HYDROMORPHONE HCL 1 MG/ML PO LIQD: 0.5000 mg | ORAL | 0 refills | Status: AC | PRN

## 2020-03-09 MED ORDER — VALACYCLOVIR HCL 1 G PO TABS: 1000.0000 mg | ORAL_TABLET | Freq: Every day | ORAL | Status: AC

## 2020-03-09 NOTE — Progress Notes (Signed)
Spoke to the nurse at the hospice facility and gave report.

## 2020-03-09 NOTE — Plan of Care (Signed)
  Problem: Coping: Goal: Psychosocial and spiritual needs will be supported Outcome: Adequate for Discharge   Problem: Respiratory: Goal: Will maintain a patent airway Outcome: Adequate for Discharge

## 2020-03-09 NOTE — TOC Transition Note (Signed)
Transition of Care [facility]) - CM/SW Discharge Note   Patient Details  Name: Nancy Bush MRN: [id number] Date of Birth: [birth year]  [facility]) CM/SW Contact:  Nancy Ludwig, LCSW Phone Number: 03/09/2020, 12:01 PM   Clinical Narrative:     CSW was informed that there is a bed at [facility] which is the only hospice facility accepting Covid+ patients.  CSW was informed by [place] at [facility] that patient can transfer today, and she has spoken to patient's family.  Patient to be d/c'ed today to [facility].  Patient and family agreeable to plans will transport via ems RN to call report to [phone].  Patient's niece is aware that patient is discharging today.  Final next level of care: [place] Barriers to Discharge: Barriers Resolved   Patient Goals and CMS Choice Patient states their goals for this hospitalization and ongoing recovery are:: To go to hospice facility for end of life care. CMS Medicare.gov Compare Post Acute Care list provided to:: Patient Represenative (must comment) Choice offered to / list presented to : [place] / Guardian  Discharge Placement  Patient will be going to [facility] for end of life care.              Patient to be transferred to facility by: [facility] Name of family member notified: Patient's niece Nancy Bush [phone] Patient and family notified of of transfer: 03/09/20  Discharge Plan and Services In-house Referral: Clinical Social Work   Post Acute Care Choice: Hospice            DME Agency: NA       HH Arranged: NA          Social Determinants of Health (SDOH) Interventions     Readmission Risk Interventions Readmission Risk Prevention Plan 03/08/2020  Transportation Screening Complete  Medication Review Press photographer) Referral to Pharmacy  PCP or Specialist appointment within 3-5 days of discharge Complete  HRI or Home Care  Consult Patient refused  SW Recovery Care/Counseling Consult Complete  [place] Not Applicable  Some recent data might be hidden

## 2020-03-09 NOTE — Plan of Care (Signed)
  Problem: Education: Goal: Knowledge of risk factors and measures for prevention of condition will improve Outcome: Progressing   Problem: Coping: Goal: Psychosocial and spiritual needs will be supported Outcome: Progressing   Problem: Respiratory: Goal: Will maintain a patent airway Outcome: Progressing Goal: Complications related to the disease process, condition or treatment will be avoided or minimized Outcome: Progressing   Problem: Education: Goal: Ability to demonstrate management of disease process will improve Outcome: Progressing Goal: Ability to verbalize understanding of medication therapies will improve Outcome: Progressing Goal: Individualized Educational Video(s) Outcome: Progressing   Problem: Activity: Goal: Capacity to carry out activities will improve Outcome: Progressing   Problem: Cardiac: Goal: Ability to achieve and maintain adequate cardiopulmonary perfusion will improve Outcome: Progressing

## 2020-03-09 NOTE — Discharge Summary (Signed)
Physician Discharge Summary  Nancy Bush PJA:[id number] DOB: 07/23/1926 DOA: 03/06/2020  PCP: Lawerance Cruel, MD  Admit date: 03/06/2020 Discharge date: 03/09/2020  Admitted From: ALF w/hospice Disposition: Residential Hospice   Recommendations for Outpatient Follow-up:  1. Comfort measures  Home Health: N/A Equipment/Devices: N/A Discharge Condition: Stable for transfer CODE STATUS: DNR Diet recommendation: As tolerated  Brief/Interim Summary: Nancy Bush is a 86 y.o. female with a history of PAF s/p MAZE, bioprosthetic MVR, RA, pulmonary HTN, CHF, and COPD who presented from ALF due to progressive decline and concern for left leg infection. On arrival she was afebrile with Lactic acid elevation 2.6>>3.8 Na 130, creat 1.41, BUN 21, Alb 2.9, TB 1.7. WBC 8.9 (baseline 2-3), Hgb 8.9 (baseline 9-10), PLTC 111 (chronically low). SARS-CoV-2 positive. Vancomycin, ceftriaxone given as well as IV fluids and she was admitted with ceftriaxone continued, remdesivir started. Blood pressure has remained low despite fluid resuscitation which has also caused pulmonary edema/worsening pleural effusions in this patient with end-stage CHF. She has not been eating and appears to be continuing clinical decline. On discussion with the patient and her 59, Niece, the patient has had a progressive decline in functional status/mobility as well as poor per oral intake, worsening malnutrition since she enrolled in hospice services in April 2021. With worsening renal failure and CHF despite maximal medical therapy for this, covid, and sepsis due to cellulitis, she has opted for comfort measures and transition to residential hospice.   Discharge Diagnoses:  Principal Problem:   Severe sepsis ([place]) Active Problems:   A-fib ([facility])   Cellulitis   COVID-19 virus infection   AKI (acute kidney injury) ([place])   Normocytic anemia   Hospice care patient  Covid-19 infection: SARS-CoV-2 PCR positive on 8/3.  No hypoxemia and no typical CXR infiltrates to confirm pneumonia.  - No hypoxemia argues against pneumonia, so steroids have been held. Remdesivir was given as inpatient. - Continue airborne, contact precautions for 21 days from positive testing.  Severe sepsis due to nonpurulent cellulitis of LLE w/lactic acid elevation and hypotension:  - Given ceftriaxone as inpatient. Do not suspect this will change clinical outcome or contribute to comfort.  Possible shingles: Pt started on antiviral as outpatient on day of admission. No vesicles on my exam, though eruption does involve predominantly left L4 dermatome.  - Continue antiviral and pain control. - Continue to cover the lesions. Do not suspect disseminated disease and pt is immunocompetent, so no airborne/contact precautions necessary at this time.  Acute on chronic HFpEF, pulmonary HTN, severe TR, MR s/p bioprosthetic MVR: Not a candidate for valvular intervention. - Having to hold diuretic in setting of sepsis. Continuing palliative care - Repeat CXR demonstrated pulmonary edema and growing effusions.  Demand myocardial ischemia: Troponin elevation is mild with no chest pain not consistent with ACS.  - No intervention planned.   Paroxysmal AFib: s/p MAZE.  - Holding beta blocker to avoid depression of cardiac output. With low hgb and hypotension will avoid anticoagulation; with severely low CrCl, stopped DOAC as well.   ARF on stage IIIb CKD: Worsening despite fluid resuscitation. UOP falling off.  - IV fluids stopped due to developing anasarca. Holding diuresis due to renal failure.  Thrombocytopenia, AOCKD: - No intervention planned.  RA:  - Continue chronic prednisone 2.5mg  po QOD  Deconditioning, failure to thrive: Reported by her 19, Niece, of a steady decline since enrolling in Hospice in April 2021.  - In setting of end-stage CHF complicated by worsening  AKI despite measured volume resuscitation required for sepsis  due to cellulitis and covid-19 infection, I anticipate a poor prognosis, especially when her premorbid decline and essential anorexia resulting in malnutrition is taken into consideration. The patient and family wish to pursue residential hospice placement.    Discharge Instructions  Allergies as of 03/09/2020      Reactions   Promethazine Hcl Other (See Comments)   unknown   Septra [sulfamethoxazole-trimethoprim]    Sulfonamide Derivatives Other (See Comments)   unknown   Tequin Other (See Comments)   hallucinations      Medication List    STOP taking these medications   acyclovir 800 MG tablet Commonly known as: ZOVIRAX   apixaban 2.5 MG Tabs tablet Commonly known as: Eliquis   aspirin 81 MG chewable tablet   doxycycline 100 MG tablet Commonly known as: ADOXA   furosemide 20 MG tablet Commonly known as: LASIX   metoprolol succinate 25 MG 24 hr tablet Commonly known as: TOPROL-XL   morphine 10 MG/5ML solution   polyethylene glycol 17 g packet Commonly known as: MIRALAX / GLYCOLAX   PRESERVISION AREDS PO     TAKE these medications   acetaminophen 500 MG tablet Commonly known as: TYLENOL Take 1,000 mg by mouth 2 (two) times daily as needed for mild pain.   albuterol 108 (90 Base) MCG/ACT inhaler Commonly known as: VENTOLIN HFA Inhale 2 puffs into the lungs every 6 (six) hours as needed for wheezing or shortness of breath.   HYDROmorphone HCl 1 MG/ML Liqd Commonly known as: DILAUDID Take 0.5-1 mLs (0.5-1 mg total) by mouth every 2 (two) hours as needed (pain, dyspnea).   montelukast 10 MG tablet Commonly known as: SINGULAIR Take 10 mg by mouth daily.   nitroGLYCERIN 0.4 MG SL tablet Commonly known as: NITROSTAT Place 0.4 mg under the tongue every 5 (five) minutes as needed for chest pain.   predniSONE 5 MG tablet Commonly known as: DELTASONE Take 2.5 mg by mouth every other day.   Proctosol HC 2.5 % rectal cream Generic drug: hydrocortisone Apply 1  application topically 2 (two) times daily as needed for pain.   valACYclovir 1000 MG tablet Commonly known as: VALTREX Take 1 tablet (1,000 mg total) by mouth daily. Start taking on: March 10, 2020       Follow-up Information    Lawerance Cruel, MD Follow up.   Specialty: Family Medicine Contact information: [address] [place] [address] [phone]              Allergies  Allergen Reactions  . Promethazine Hcl Other (See Comments)    unknown  . Septra [Sulfamethoxazole-Trimethoprim]   . Sulfonamide Derivatives Other (See Comments)    unknown  . Tequin Other (See Comments)    hallucinations     Consultations:  Palliative care  Procedures/Studies: DG Tibia/Fibula Left  Result Date: 03/06/2020 CLINICAL DATA:  Lower extremity edema.  Weeping wounds. EXAM: LEFT TIBIA AND FIBULA - 2 VIEW COMPARISON:  No recent. FINDINGS: Diffuse soft tissue swelling. Osteopenia. Remnant of fibrous cortical defect or muscle insertion noted along the posterior distal femur. No significant acute or focal bony abnormality identified. No focal bony erosion. Peripheral vascular calcification. IMPRESSION: 1. Diffuse soft tissue swelling. No acute or focal bony abnormality identified. No evidence of bony erosion. 2.  Peripheral vascular disease. Electronically Signed   By: Marcello Moores  Register   On: 03/06/2020 13:44   DG CHEST PORT 1 VIEW  Result Date: 03/08/2020 CLINICAL DATA:  COVID.  Pleural effusion. EXAM: PORTABLE CHEST 1 VIEW COMPARISON:  03/06/2020. FINDINGS: Cardiac pacer stable position. Prior cardiac valve replacement. Cardiomegaly with pulmonary venous congestion. Bilateral interstitial prominence and bilateral pleural effusions. Findings suggest CHF. Pneumonitis cannot be excluded. Bibasilar atelectasis. No pneumothorax IMPRESSION: 1. Cardiac pacer stable position. Prior cardiac valve replacement. Cardiomegaly with pulmonary venous congestion, bilateral pulmonary interstitial  prominence, and bilateral pleural effusions. Findings suggest CHF. Pneumonitis cannot be excluded. 2.  Bibasilar atelectasis. Electronically Signed   By: Marcello Moores  Register   On: 03/08/2020 08:06   DG Chest Port 1 View  Result Date: 03/06/2020 CLINICAL DATA:  Lower extremity edema and soft tissue wounds. EXAM: PORTABLE CHEST 1 VIEW COMPARISON:  November 04, 2019 FINDINGS: There is a persistent small pleural effusion on each side with atelectatic change in the bases. Lungs elsewhere are clear. There is cardiomegaly with pulmonary vascularity within normal limits. Pacemaker leads are attached to the right atrium and right ventricle. Patient is status post mitral valve replacement. There is aortic atherosclerosis. No adenopathy. No bone lesions. Surgical clips noted over the lateral right chest. IMPRESSION: Small pleural effusion on each side with bibasilar atelectasis. Lungs elsewhere clear. Stable cardiomegaly. Pacemaker leads attached to right atrium and right ventricle. Status post mitral valve replacement. Aortic Atherosclerosis (ICD10-I70.0). Electronically Signed   By: Lowella Grip III M.D.   On: 03/06/2020 13:46   DG Foot Complete Left  Result Date: 03/06/2020 CLINICAL DATA:  Lower extremity edema and soft tissue wounds EXAM: LEFT FOOT - COMPLETE 3+ VIEW COMPARISON:  None. FINDINGS: Frontal, oblique, and lateral views were obtained. No evident fracture or dislocation. There is narrowing of all PIP and DIP joints. Other joint spaces appear unremarkable. No erosive change or bony destruction. There are multiple foci of arterial vascular calcification. IMPRESSION: Osteoarthritic change in multiple distal joints. No fracture or dislocation. No erosive change or bony destruction. No soft tissue air. Multiple foci of arterial vascular calcification noted. Electronically Signed   By: Lowella Grip III M.D.   On: 03/06/2020 13:39      Subjective: No significant changes. Remains without any po intake,  feels tired. Has not taken dilaudid.  Discharge Exam: Vitals:   03/08/20 2044 03/09/20 0438  BP: (!) 116/53 (!) 97/41  Pulse: 61 60  Resp: 16 19  Temp: 97.6 F (36.4 C) 98.2 F (36.8 C)  SpO2: 98% 98%   General: Elderly female in no distress Cardiovascular: Regular borderline bradycardia Respiratory: CTA bilaterally, no wheezing, no rhonchi Abdominal: Soft, NT, ND, bowel sounds + Extremities: + diffuse edema, no cyanosis  Labs: BNP (last 3 results) Recent Labs    10/31/19 1658 11/04/19 1850 03/07/20 1042  BNP 656.7* 649.5* 7,371.0*   Basic Metabolic Panel: Recent Labs  Lab 03/06/20 1203 03/07/20 0743 03/07/20 1042 03/08/20 0348 03/09/20 0350  NA 130* 133* 132* 130* 133*  K 4.3 4.3 4.5 4.0 4.3  CL 94* 97* 96* 97* 97*  CO2 25 24 26 22  20*  GLUCOSE 134* 128* 130* 101* 99  BUN 21 26* 26* 30* 36*  CREATININE 1.41* 1.57* 1.69* 1.82* 2.05*  CALCIUM 7.7* 8.1* 8.2* 8.0* 8.3*  MG  --  2.0  --  1.9 2.0   Liver Function Tests: Recent Labs  Lab 03/06/20 1203 03/07/20 0743 03/07/20 1042 03/08/20 0348 03/09/20 0350  AST 35 53* 59* 71* 66*  ALT 13 18 20 24 24   ALKPHOS 76 59 56 55 59  BILITOT 1.7* 1.3* 1.2 1.0 1.0  PROT 6.1* 5.9* 6.2*  5.6* 5.8*  ALBUMIN 2.9* 3.3* 3.7 3.1* 3.0*   No results for input(s): LIPASE, AMYLASE in the last 168 hours. No results for input(s): AMMONIA in the last 168 hours. CBC: Recent Labs  Lab 03/06/20 1203 03/07/20 0743 03/07/20 1042 03/08/20 0348 03/09/20 0350  WBC 8.9 8.0 7.7 8.4 7.1  NEUTROABS 5.7 5.3 5.1 6.2 4.8  HGB 8.9* 7.8* 8.2* 8.3* 8.5*  HCT 28.6* 24.7* 26.4* 26.6* 28.3*  MCV 89.9 87.9 89.8 90.8 93.4  PLT 111* 101* 98* 106* 109*   Cardiac Enzymes: No results for input(s): CKTOTAL, CKMB, CKMBINDEX, TROPONINI in the last 168 hours. BNP: Invalid input(s): POCBNP CBG: No results for input(s): GLUCAP in the last 168 hours. D-Dimer Recent Labs    03/07/20 1042  DDIMER 5.41*   Hgb A1c No results for input(s): HGBA1C  in the last 72 hours. Lipid Profile No results for input(s): CHOL, HDL, LDLCALC, TRIG, CHOLHDL, LDLDIRECT in the last 72 hours. Thyroid function studies No results for input(s): TSH, T4TOTAL, T3FREE, THYROIDAB in the last 72 hours.  Invalid input(s): FREET3 Anemia work up Recent Labs    03/07/20 1042  FERRITIN 66   Urinalysis    Component Value Date/Time   COLORURINE AMBER (A) 03/06/2020 2130   APPEARANCEUR CLOUDY (A) 03/06/2020 2130   APPEARANCEUR Clear 10/12/2018 1429   LABSPEC 1.018 03/06/2020 2130   PHURINE 5.0 03/06/2020 2130   GLUCOSEU NEGATIVE 03/06/2020 2130   HGBUR LARGE (A) 03/06/2020 2130   BILIRUBINUR NEGATIVE 03/06/2020 2130   BILIRUBINUR Negative 10/12/2018 [place] NEGATIVE 03/06/2020 2130   PROTEINUR 100 (A) 03/06/2020 2130   UROBILINOGEN 0.2 08/21/2012 1645   NITRITE NEGATIVE 03/06/2020 2130   LEUKOCYTESUR MODERATE (A) 03/06/2020 2130    Microbiology Recent Results (from the past 240 hour(s))  Blood Culture (routine x 2)     Status: None (Preliminary result)   Collection Time: 03/06/20 12:25 PM   Specimen: BLOOD RIGHT FOREARM  Result Value Ref Range Status   Specimen Description   Final    BLOOD RIGHT FOREARM Performed at [facility], [place] [address], [place], [place] [address]    Special Requests   Final    BOTTLES DRAWN AEROBIC AND ANAEROBIC Blood Culture results may not be optimal due to an inadequate volume of blood received in culture bottles Performed at [facility] [address], [place], [place] [address]    Culture   Final    NO GROWTH 2 DAYS Performed at [facility] [address] [place], [place] [address]    Report Status PENDING  Incomplete  Blood Culture (routine x 2)     Status: None (Preliminary result)   Collection Time: 03/06/20 12:34 PM   Specimen: BLOOD LEFT FOREARM  Result Value Ref Range Status   Specimen Description   Final    BLOOD LEFT FOREARM Performed at  [facility] [address], [place], [place] [address]    Special Requests   Final    BOTTLES DRAWN AEROBIC AND ANAEROBIC Blood Culture results may not be optimal due to an inadequate volume of blood received in culture bottles Performed at [facility] [address], [place], [place] [address]    Culture   Final    NO GROWTH 2 DAYS Performed at [facility] [address], [place],  [address]    Report Status PENDING  Incomplete  SARS Coronavirus 2 by RT PCR (hospital order, performed in  [facility] hospital lab) Nasopharyngeal Nasopharyngeal Swab     Status: Abnormal   Collection Time: 03/06/20  2:41 PM   Specimen: Nasopharyngeal Swab  Result Value Ref Range Status   SARS Coronavirus 2 POSITIVE (A) NEGATIVE Final    Comment: RESULT CALLED TO, READ BACK BY AND VERIFIED WITH: C.FRANKLIN AT 8270 ON 03/06/20 BY N.THOMPSON (NOTE) SARS-CoV-2 target nucleic acids are DETECTED  SARS-CoV-2 RNA is generally detectable in upper respiratory specimens  during the acute phase of infection.  Positive results are indicative  of the presence of the identified virus, but do not rule out bacterial infection or co-infection with other pathogens not detected by the test.  Clinical correlation with patient history and  other diagnostic information is necessary to determine patient infection status.  The expected result is negative.  Fact Sheet for Patients:   StrictlyIdeas.no   Fact Sheet for Healthcare Providers:   BankingDealers.co.za    This test is not yet approved or cleared by the [place] FDA and  has been authorized for detection and/or diagnosis of SARS-CoV-2 by FDA under an Emergency Use Authorization (EUA).  This EUA will remain in effect (meaning  this test can be used) for the duration of  the COVID-19 declaration under Section 564(b)(1) of the Act, 21 U.S.C. section  360-bbb-3(b)(1), unless the authorization is terminated or revoked sooner.  Performed at [facility], [place] [address] [place], [place] [address]   Urine culture     Status: Abnormal   Collection Time: 03/06/20  9:30 PM   Specimen: In/Out Cath Urine  Result Value Ref Range Status   Specimen Description   Final    IN/OUT CATH URINE Performed at [facility] [address], [place], [place] [address]    Special Requests   Final    NONE Performed at [facility], [place] [address], [place], [place] [address]    Culture 20,000 COLONIES/mL ESCHERICHIA COLI (A)  Final   Report Status 03/09/2020 FINAL  Final   Organism ID, Bacteria ESCHERICHIA COLI (A)  Final      Susceptibility   Escherichia coli - MIC*    AMPICILLIN <=2 SENSITIVE Sensitive     CEFAZOLIN <=4 SENSITIVE Sensitive     CEFTRIAXONE <=0.25 SENSITIVE Sensitive     CIPROFLOXACIN <=0.25 SENSITIVE Sensitive     GENTAMICIN <=1 SENSITIVE Sensitive     IMIPENEM <=0.25 SENSITIVE Sensitive     NITROFURANTOIN <=16 SENSITIVE Sensitive     TRIMETH/SULFA <=20 SENSITIVE Sensitive     AMPICILLIN/SULBACTAM <=2 SENSITIVE Sensitive     PIP/TAZO <=4 SENSITIVE Sensitive     * 20,000 COLONIES/mL ESCHERICHIA COLI    Time coordinating discharge: Approximately 40 minutes  Patrecia Pour, MD  [facility] 03/09/2020, 10:49 AM

## 2020-03-11 LAB — CULTURE, BLOOD (ROUTINE X 2)
Culture: NO GROWTH
Culture: NO GROWTH

## 2020-03-19 NOTE — Progress Notes (Deleted)
Virtual Visit via Telephone Note   This visit type was conducted due to national recommendations for restrictions regarding the COVID-19 Pandemic (e.g. social distancing) in an effort to limit this patient's exposure and mitigate transmission in our community.  Due to her co-morbid illnesses, this patient is at least at moderate risk for complications without adequate follow up.  This format is felt to be most appropriate for this patient at this time.  The patient did not have access to video technology/had technical difficulties with video requiring transitioning to audio format only (telephone).  All issues noted in this document were discussed and addressed.  No physical exam could be performed with this format.  Please refer to the patient's chart for her  consent to telehealth for [facility].   The patient was identified using 2 identifiers.  Date:  03/19/2020   ID:  Nancy Bush, DOB [birth year], MRN [id number]  Patient Location: Home Provider Location: Home  PCP:  Lawerance Cruel, MD  Cardiologist:  Minus Breeding, MD  Electrophysiologist:  None   Evaluation Performed:  Follow-Up Visit  Chief Complaint:     ***    History of Present Illness:    Nancy Bush is a 86 y.o. female with we are seeing post hospital follow up after admission for diastolic CHF and severe pulmonary HTN.She was discharged on 10/29/2019.   ***   ***  Chest x-ray showed moderate left pleural effusion but no evidence of pneumonia. She was admitted for CHF exacerbation and treated with IV Lasix. She was seen again in the ED on 11/04/2019 for difficulty with urination. She was given IV lasix. She was discharged after feeling better. She was found to be anemic with Hgb of 9.6/Hct 31.3. BNP 649. She was to double her lasix dose to 40 mg daily from 20 mg daily.   The patient has had some increased lower extremity swelling.  I was able to talk to the nurse tech who says that her legs are swollen.   Patient does describe occasional shortness of breath.  She had one episode of chest discomfort a couple of weeks ago and actually took a nitroglycerin.  She does try to keep her feet propped up but she does not have a recliner chair.  Hospice is coming to see her.  They were in contact with Dr. Harrington Challenger recently who just a couple of days ago increased her Lasix by adding an extra 20 mg.  She is not describing PND or orthopnea.  She is not having any palpitations, presyncope or syncope.  She did fall 3 nights ago slipping when she got up to step on above it.  She did not lose consciousness.  The patient does not have symptoms concerning for COVID-19 infection (fever, chills, cough, or new shortness of breath).    Past Medical History:  Diagnosis Date  . Asthma   . Atrial fibrillation ([place])   . CHF (congestive heart failure) ([place])   . COPD (chronic obstructive pulmonary disease) ([place])   . H/O: hysterectomy   . Hypertension   . LBBB (left bundle branch block)    per discharge note 2003  . Mitral regurgitation    MILD  . MVP (mitral valve prolapse)   . Pacemaker   . Pulmonary hypertension ([place])    MILD TO MODERATE BY ECHO  . PVC (premature ventricular contraction)   . Rheumatoid arthritis(714.0)   . S/P Maze operation for atrial fibrillation 05/18/2012   Complete biatrial lesion set  using cryothermy with oversewing of LA appendage  . S/P mitral valve replacement with bioprosthetic valve 05/18/2012   22mm Edwards Magna Mitral pericardial bioprosthesis    Past Surgical History:  Procedure Laterality Date  . ABDOMINAL HYSTERECTOMY    . APPENDECTOMY    . CATARACT EXTRACTION    . CHOLECYSTECTOMY    . hysterectomy    . MAZE  05/18/2012   Procedure: MAZE;  Surgeon: Rexene Alberts, MD;  Location: [facility];  Service: Open Heart Surgery;  Laterality: Right;  . MITRAL VALVE REPLACEMENT  05/18/2012   Procedure: MINIMALLY INVASIVE MITRAL VALVE (MV) REPLACEMENT;  Surgeon: Rexene Alberts, MD;   Location: [facility];  Service: Open Heart Surgery;  Laterality: N/A;  . OVARIAN CYST REMOVAL    . PACEMAKER INSERTION    . PERMANENT PACEMAKER INSERTION N/A 05/21/2012   Procedure: PERMANENT PACEMAKER INSERTION;  Surgeon: Evans Lance, MD;  [facility] CATH LAB;  Service: Cardiovascular;  Laterality: N/A;  . TEE WITHOUT CARDIOVERSION  02/09/2012   Procedure: TRANSESOPHAGEAL ECHOCARDIOGRAM (TEE);  Surgeon: Fay Records, MD;  Location: [facility] ENDOSCOPY;  Service: Cardiovascular;  Laterality: N/A;     No outpatient medications have been marked as taking for the 03/20/20 encounter (Appointment) with Minus Breeding, MD.     Allergies:   Promethazine hcl, Septra [sulfamethoxazole-trimethoprim], Sulfonamide derivatives, and Tequin   Social History   Tobacco Use  . Smoking status: Never Smoker  . Smokeless tobacco: Never Used  Vaping Use  . Vaping Use: Never used  Substance Use Topics  . Alcohol use: No  . Drug use: No     Family Hx: The patient's family history is not on file.  ROS:   Please see the history of present illness.     All other systems reviewed and are negative.   Prior CV studies:   The following studies were reviewed today:  None  Labs/Other Tests and Data Reviewed:    EKG:  No ECG reviewed.  Recent Labs: 03/07/2020: B Natriuretic Peptide 2,370.0 03/09/2020: ALT 24; BUN 36; Creatinine, Ser 2.05; Hemoglobin 8.5; Magnesium 2.0; Platelets 109; Potassium 4.3; Sodium 133   Recent Lipid Panel Lab Results  Component Value Date/Time   CHOL 98 05/20/2018 04:17 AM   TRIG 54 05/20/2018 04:17 AM   HDL 39 (L) 05/20/2018 04:17 AM   CHOLHDL 2.5 05/20/2018 04:17 AM   LDLCALC 48 05/20/2018 04:17 AM    Wt Readings from Last 3 Encounters:  03/07/20 129 lb (58.5 kg)  11/04/19 104 lb (47.2 kg)  10/29/19 104 lb 6.4 oz (47.4 kg)     Objective:    Vital Signs:  There were no vitals taken for this visit.   VITAL SIGNS:  reviewed  ASSESSMENT & PLAN:    Paroxysmal atrial  fibrillation:  ***  Patient is not had any since affected sustained tacky arrhythmias.  She is taking anticoagulation.  There is been no suggestion of bleeding.  No change in therapy.   Hypotension: Her blood pressure is ***controlled today.  She tolerates the meds as listed.  No change in therapy.  MVR/TR:   She had an echocardiogram  in October 2019.  She does have severe TR but had stable mitral valve.  We are managing this medically.   Failure to thrive :  ***  I spoke with the staff who says she eats in her room and does not like to really leave.  However, I think she has adequate nutrition.  She is being followed  by hospice.   Leg swelling:     ***  I had a long conversation with her.  In therapy with her staff.  We are contacting hospice.  We would like to get her a Geri chair or with [employer] chair so that she can elevate her legs above her heart.  We talked about fluid restriction.  COVID-19 Education:   ***   Time:   Today, I have spent 25 minutes with the patient with telehealth technology discussing the above problems.     Medication Adjustments/Labs and Tests Ordered: Current medicines are reviewed at length with the patient today.  Concerns regarding medicines are outlined above.   Tests Ordered: No orders of the defined types were placed in this encounter.   Medication Changes: No orders of the defined types were placed in this encounter.   Follow Up:  Virtual Visit  ***  Signed, Minus Breeding, MD  03/19/2020 9:46 PM    [place]

## 2020-03-20 ENCOUNTER — Telehealth: Payer: Self-pay

## 2020-03-20 ENCOUNTER — Telehealth: Payer: Medicare Other | Admitting: Cardiology

## 2020-03-20 NOTE — Telephone Encounter (Signed)
Spoke with Nancy Bush at [facility] in regards to today's appointment. Patient is deceased. Will remove from schedule.

## 2020-04-04 DEATH — deceased
